# Patient Record
Sex: Female | Born: 1950
Health system: Southern US, Community
[De-identification: ages and names within clinical notes are randomized; demographics above are authoritative.]

## PROBLEM LIST (undated history)

## (undated) DIAGNOSIS — I10 Essential (primary) hypertension: Secondary | ICD-10-CM

## (undated) DIAGNOSIS — Z972 Presence of dental prosthetic device (complete) (partial): Secondary | ICD-10-CM

## (undated) DIAGNOSIS — E039 Hypothyroidism, unspecified: Secondary | ICD-10-CM

## (undated) DIAGNOSIS — E079 Disorder of thyroid, unspecified: Secondary | ICD-10-CM

## (undated) DIAGNOSIS — I6529 Occlusion and stenosis of unspecified carotid artery: Secondary | ICD-10-CM

## (undated) DIAGNOSIS — J387 Other diseases of larynx: Secondary | ICD-10-CM

## (undated) DIAGNOSIS — E785 Hyperlipidemia, unspecified: Secondary | ICD-10-CM

## (undated) DIAGNOSIS — R011 Cardiac murmur, unspecified: Secondary | ICD-10-CM

## (undated) DIAGNOSIS — Z973 Presence of spectacles and contact lenses: Secondary | ICD-10-CM

## (undated) DIAGNOSIS — T7840XA Allergy, unspecified, initial encounter: Secondary | ICD-10-CM

## (undated) HISTORY — PX: CARPAL TUNNEL RELEASE: SHX101

## (undated) HISTORY — DX: Disorder of thyroid, unspecified: E07.9

## (undated) HISTORY — DX: Hyperlipidemia, unspecified: E78.5

## (undated) HISTORY — PX: THYROIDECTOMY: SHX17

## (undated) HISTORY — PX: OTHER SURGICAL HISTORY: SHX169

## (undated) HISTORY — DX: Essential (primary) hypertension: I10

## (undated) HISTORY — DX: Allergy, unspecified, initial encounter: T78.40XA

## (undated) HISTORY — DX: Occlusion and stenosis of unspecified carotid artery: I65.29

## (undated) HISTORY — PX: DILATION AND CURETTAGE OF UTERUS: SHX78

---

## 1973-08-01 HISTORY — PX: TUBAL LIGATION: SHX77

## 1998-01-05 ENCOUNTER — Other Ambulatory Visit: Admission: RE | Admit: 1998-01-05 | Discharge: 1998-01-05 | Payer: Self-pay | Admitting: Family Medicine

## 1998-02-05 ENCOUNTER — Emergency Department (HOSPITAL_COMMUNITY): Admission: EM | Admit: 1998-02-05 | Discharge: 1998-02-05 | Payer: Self-pay | Admitting: Emergency Medicine

## 2000-11-29 ENCOUNTER — Encounter (INDEPENDENT_AMBULATORY_CARE_PROVIDER_SITE_OTHER): Payer: Self-pay | Admitting: Internal Medicine

## 2000-11-29 LAB — CONVERTED CEMR LAB: Pap Smear: NORMAL

## 2000-12-19 ENCOUNTER — Encounter: Payer: Self-pay | Admitting: Family Medicine

## 2000-12-19 ENCOUNTER — Ambulatory Visit (HOSPITAL_COMMUNITY): Admission: RE | Admit: 2000-12-19 | Discharge: 2000-12-19 | Payer: Self-pay | Admitting: Family Medicine

## 2002-01-25 ENCOUNTER — Emergency Department (HOSPITAL_COMMUNITY): Admission: EM | Admit: 2002-01-25 | Discharge: 2002-01-26 | Payer: Self-pay | Admitting: Emergency Medicine

## 2002-04-30 ENCOUNTER — Ambulatory Visit (HOSPITAL_BASED_OUTPATIENT_CLINIC_OR_DEPARTMENT_OTHER): Admission: RE | Admit: 2002-04-30 | Discharge: 2002-04-30 | Payer: Self-pay | Admitting: Orthopedic Surgery

## 2005-03-22 ENCOUNTER — Ambulatory Visit: Payer: Self-pay | Admitting: Family Medicine

## 2005-03-29 ENCOUNTER — Ambulatory Visit: Payer: Self-pay | Admitting: Family Medicine

## 2005-04-18 ENCOUNTER — Ambulatory Visit: Payer: Self-pay | Admitting: Family Medicine

## 2005-06-01 ENCOUNTER — Ambulatory Visit: Payer: Self-pay | Admitting: Family Medicine

## 2005-10-20 ENCOUNTER — Ambulatory Visit: Payer: Self-pay | Admitting: Family Medicine

## 2005-11-03 ENCOUNTER — Ambulatory Visit: Payer: Self-pay | Admitting: Family Medicine

## 2006-01-03 ENCOUNTER — Ambulatory Visit: Payer: Self-pay | Admitting: Family Medicine

## 2006-03-06 ENCOUNTER — Ambulatory Visit: Payer: Self-pay | Admitting: Family Medicine

## 2006-06-05 ENCOUNTER — Ambulatory Visit: Payer: Self-pay | Admitting: Family Medicine

## 2006-06-14 ENCOUNTER — Ambulatory Visit (HOSPITAL_COMMUNITY): Admission: RE | Admit: 2006-06-14 | Discharge: 2006-06-14 | Payer: Self-pay | Admitting: Family Medicine

## 2006-09-05 ENCOUNTER — Ambulatory Visit: Payer: Self-pay | Admitting: Family Medicine

## 2006-10-31 ENCOUNTER — Ambulatory Visit: Payer: Self-pay | Admitting: Family Medicine

## 2006-10-31 LAB — CONVERTED CEMR LAB
AST: 22 units/L (ref 0–37)
BUN: 15 mg/dL (ref 6–23)
CO2: 33 meq/L — ABNORMAL HIGH (ref 19–32)
GFR calc Af Amer: 133 mL/min
HDL: 39.3 mg/dL (ref >39.0)
Potassium: 3.9 meq/L (ref 3.5–5.1)
Sodium: 142 meq/L (ref 135–145)
Total CHOL/HDL Ratio: 4
Triglycerides: 82 mg/dL (ref 0–149)

## 2007-01-04 ENCOUNTER — Ambulatory Visit: Payer: Self-pay | Admitting: Internal Medicine

## 2007-01-04 DIAGNOSIS — E669 Obesity, unspecified: Secondary | ICD-10-CM

## 2007-01-04 DIAGNOSIS — I10 Essential (primary) hypertension: Secondary | ICD-10-CM

## 2007-01-04 DIAGNOSIS — E782 Mixed hyperlipidemia: Secondary | ICD-10-CM

## 2007-01-04 DIAGNOSIS — E039 Hypothyroidism, unspecified: Secondary | ICD-10-CM

## 2007-02-27 ENCOUNTER — Encounter: Payer: Self-pay | Admitting: Internal Medicine

## 2007-02-27 DIAGNOSIS — M949 Disorder of cartilage, unspecified: Secondary | ICD-10-CM

## 2007-02-27 DIAGNOSIS — F17200 Nicotine dependence, unspecified, uncomplicated: Secondary | ICD-10-CM | POA: Insufficient documentation

## 2007-02-27 DIAGNOSIS — M899 Disorder of bone, unspecified: Secondary | ICD-10-CM

## 2007-02-27 DIAGNOSIS — H9190 Unspecified hearing loss, unspecified ear: Secondary | ICD-10-CM | POA: Insufficient documentation

## 2007-02-27 DIAGNOSIS — R945 Abnormal results of liver function studies: Secondary | ICD-10-CM | POA: Insufficient documentation

## 2007-03-07 ENCOUNTER — Ambulatory Visit: Payer: Self-pay | Admitting: Internal Medicine

## 2007-03-07 DIAGNOSIS — E739 Lactose intolerance, unspecified: Secondary | ICD-10-CM

## 2007-03-07 DIAGNOSIS — R0989 Other specified symptoms and signs involving the circulatory and respiratory systems: Secondary | ICD-10-CM

## 2007-03-08 ENCOUNTER — Ambulatory Visit: Payer: Self-pay

## 2007-03-08 ENCOUNTER — Encounter (INDEPENDENT_AMBULATORY_CARE_PROVIDER_SITE_OTHER): Payer: Self-pay | Admitting: Internal Medicine

## 2007-03-08 LAB — CONVERTED CEMR LAB: TSH: 0.56 microintl units/mL (ref 0.35–5.50)

## 2007-03-09 ENCOUNTER — Telehealth (INDEPENDENT_AMBULATORY_CARE_PROVIDER_SITE_OTHER): Payer: Self-pay | Admitting: *Deleted

## 2007-04-12 ENCOUNTER — Ambulatory Visit: Payer: Self-pay | Admitting: Internal Medicine

## 2007-04-20 ENCOUNTER — Encounter (INDEPENDENT_AMBULATORY_CARE_PROVIDER_SITE_OTHER): Payer: Self-pay | Admitting: Internal Medicine

## 2007-04-20 ENCOUNTER — Ambulatory Visit: Payer: Self-pay | Admitting: Vascular Surgery

## 2007-06-12 ENCOUNTER — Ambulatory Visit: Payer: Self-pay | Admitting: Family Medicine

## 2007-09-11 ENCOUNTER — Ambulatory Visit: Payer: Self-pay | Admitting: Family Medicine

## 2007-09-13 LAB — CONVERTED CEMR LAB
BUN: 11 mg/dL (ref 6–23)
CO2: 32 meq/L (ref 19–32)
Cholesterol: 169 mg/dL (ref 0–200)
Creatinine, Ser: 0.8 mg/dL (ref 0.4–1.2)
GFR calc Af Amer: 95 mL/min
Glucose, Bld: 108 mg/dL — ABNORMAL HIGH (ref 70–99)
HDL: 35.4 mg/dL — ABNORMAL LOW (ref >39.0)
Potassium: 3.8 meq/L (ref 3.5–5.1)
Sodium: 141 meq/L (ref 135–145)
Total CHOL/HDL Ratio: 4.8
Triglycerides: 170 mg/dL — ABNORMAL HIGH (ref 0–149)

## 2007-10-19 ENCOUNTER — Ambulatory Visit: Payer: Self-pay | Admitting: Vascular Surgery

## 2007-10-25 ENCOUNTER — Ambulatory Visit: Payer: Self-pay | Admitting: Family Medicine

## 2007-12-07 ENCOUNTER — Telehealth (INDEPENDENT_AMBULATORY_CARE_PROVIDER_SITE_OTHER): Payer: Self-pay | Admitting: Internal Medicine

## 2008-02-29 ENCOUNTER — Ambulatory Visit: Payer: Self-pay | Admitting: Family Medicine

## 2008-04-18 ENCOUNTER — Ambulatory Visit: Payer: Self-pay | Admitting: Vascular Surgery

## 2008-05-09 ENCOUNTER — Ambulatory Visit: Payer: Self-pay | Admitting: Family Medicine

## 2008-05-14 LAB — CONVERTED CEMR LAB
ALT: 32 units/L (ref 0–35)
AST: 22 units/L (ref 0–37)
Direct LDL: 92.6 mg/dL
HDL: 29.4 mg/dL — ABNORMAL LOW (ref >39.0)
Total CHOL/HDL Ratio: 5.9
VLDL: 64 mg/dL — ABNORMAL HIGH (ref 0–40)

## 2008-07-29 ENCOUNTER — Ambulatory Visit: Payer: Self-pay | Admitting: Family Medicine

## 2008-11-07 ENCOUNTER — Ambulatory Visit: Payer: Self-pay | Admitting: Internal Medicine

## 2008-11-11 LAB — CONVERTED CEMR LAB
AST: 23 units/L (ref 0–37)
Albumin: 3.9 g/dL (ref 3.5–5.2)
Alkaline Phosphatase: 83 units/L (ref 39–117)
Total Bilirubin: 0.6 mg/dL (ref 0.3–1.2)
Total CHOL/HDL Ratio: 6
VLDL: 55.8 mg/dL — ABNORMAL HIGH (ref 0.0–40.0)

## 2008-11-14 ENCOUNTER — Encounter (INDEPENDENT_AMBULATORY_CARE_PROVIDER_SITE_OTHER): Payer: Self-pay | Admitting: Internal Medicine

## 2008-11-14 ENCOUNTER — Ambulatory Visit: Payer: Self-pay | Admitting: Family Medicine

## 2008-11-18 ENCOUNTER — Encounter (INDEPENDENT_AMBULATORY_CARE_PROVIDER_SITE_OTHER): Payer: Self-pay | Admitting: Internal Medicine

## 2008-11-19 LAB — CONVERTED CEMR LAB
BUN: 25 mg/dL — ABNORMAL HIGH (ref 6–23)
CO2: 32 meq/L (ref 19–32)
Chloride: 100 meq/L (ref 96–112)
Creatinine, Ser: 0.8 mg/dL (ref 0.4–1.2)
Potassium: 4 meq/L (ref 3.5–5.1)
TSH: 0.07 microintl units/mL — ABNORMAL LOW (ref 0.35–5.50)
Vit D, 25-Hydroxy: 23 ng/mL — ABNORMAL LOW (ref 30–89)

## 2008-11-27 ENCOUNTER — Encounter: Payer: Self-pay | Admitting: Family Medicine

## 2008-11-28 ENCOUNTER — Encounter (INDEPENDENT_AMBULATORY_CARE_PROVIDER_SITE_OTHER): Payer: Self-pay | Admitting: Internal Medicine

## 2008-11-28 ENCOUNTER — Ambulatory Visit (HOSPITAL_COMMUNITY): Admission: RE | Admit: 2008-11-28 | Discharge: 2008-11-28 | Payer: Self-pay | Admitting: Family Medicine

## 2008-12-05 ENCOUNTER — Encounter (INDEPENDENT_AMBULATORY_CARE_PROVIDER_SITE_OTHER): Payer: Self-pay | Admitting: *Deleted

## 2008-12-05 ENCOUNTER — Ambulatory Visit: Payer: Self-pay | Admitting: Vascular Surgery

## 2008-12-11 ENCOUNTER — Inpatient Hospital Stay (HOSPITAL_COMMUNITY): Admission: RE | Admit: 2008-12-11 | Discharge: 2008-12-12 | Payer: Self-pay | Admitting: Vascular Surgery

## 2008-12-11 ENCOUNTER — Ambulatory Visit: Payer: Self-pay | Admitting: Vascular Surgery

## 2008-12-11 ENCOUNTER — Encounter: Payer: Self-pay | Admitting: Vascular Surgery

## 2008-12-11 HISTORY — PX: CAROTID ENDARTERECTOMY: SUR193

## 2008-12-16 ENCOUNTER — Encounter (INDEPENDENT_AMBULATORY_CARE_PROVIDER_SITE_OTHER): Payer: Self-pay | Admitting: Internal Medicine

## 2008-12-26 ENCOUNTER — Ambulatory Visit: Payer: Self-pay | Admitting: Vascular Surgery

## 2008-12-26 ENCOUNTER — Encounter (INDEPENDENT_AMBULATORY_CARE_PROVIDER_SITE_OTHER): Payer: Self-pay | Admitting: Internal Medicine

## 2008-12-30 ENCOUNTER — Encounter: Admission: RE | Admit: 2008-12-30 | Discharge: 2008-12-30 | Payer: Self-pay | Admitting: Family Medicine

## 2009-01-02 ENCOUNTER — Ambulatory Visit: Payer: Self-pay | Admitting: Family Medicine

## 2009-01-06 ENCOUNTER — Encounter (INDEPENDENT_AMBULATORY_CARE_PROVIDER_SITE_OTHER): Payer: Self-pay | Admitting: Internal Medicine

## 2009-02-13 ENCOUNTER — Ambulatory Visit: Payer: Self-pay | Admitting: Family Medicine

## 2009-02-13 DIAGNOSIS — R609 Edema, unspecified: Secondary | ICD-10-CM

## 2009-02-17 ENCOUNTER — Encounter (INDEPENDENT_AMBULATORY_CARE_PROVIDER_SITE_OTHER): Payer: Self-pay | Admitting: Internal Medicine

## 2009-02-17 LAB — CONVERTED CEMR LAB
BUN: 22 mg/dL (ref 6–23)
CO2: 31 meq/L (ref 19–32)
Calcium: 9.6 mg/dL (ref 8.4–10.5)
Chloride: 105 meq/L (ref 96–112)
Cholesterol: 185 mg/dL (ref 0–200)
Creatinine, Ser: 0.9 mg/dL (ref 0.4–1.2)
HDL: 32.4 mg/dL — ABNORMAL LOW (ref >39.00)
Total CHOL/HDL Ratio: 6
Triglycerides: 142 mg/dL (ref 0.0–149.0)

## 2009-04-03 ENCOUNTER — Ambulatory Visit: Payer: Self-pay | Admitting: Family Medicine

## 2009-04-07 LAB — CONVERTED CEMR LAB
Glucose, Bld: 120 mg/dL — ABNORMAL HIGH (ref 70–99)
HDL: 28.9 mg/dL — ABNORMAL LOW (ref >39.00)
LDL Cholesterol: 85 mg/dL (ref 0–99)
Total CHOL/HDL Ratio: 5
Triglycerides: 120 mg/dL (ref 0.0–149.0)
VLDL: 24 mg/dL (ref 0.0–40.0)

## 2009-05-07 ENCOUNTER — Ambulatory Visit: Payer: Self-pay | Admitting: Family Medicine

## 2009-07-01 ENCOUNTER — Ambulatory Visit: Payer: Self-pay | Admitting: Vascular Surgery

## 2009-07-28 ENCOUNTER — Ambulatory Visit: Payer: Self-pay | Admitting: Family Medicine

## 2009-10-12 ENCOUNTER — Ambulatory Visit: Payer: Self-pay | Admitting: Family Medicine

## 2009-10-12 LAB — CONVERTED CEMR LAB
ALT: 26 units/L (ref 0–35)
AST: 21 units/L (ref 0–37)
Alkaline Phosphatase: 72 units/L (ref 39–117)
BUN: 13 mg/dL (ref 6–23)
Bilirubin, Direct: 0.1 mg/dL (ref 0.0–0.3)
Calcium: 10.3 mg/dL (ref 8.4–10.5)
Creatinine, Ser: 0.8 mg/dL (ref 0.4–1.2)
GFR calc non Af Amer: 77.65 mL/min (ref >60)
Glucose, Bld: 116 mg/dL — ABNORMAL HIGH (ref 70–99)
Potassium: 4.4 meq/L (ref 3.5–5.1)
Total Bilirubin: 0.5 mg/dL (ref 0.3–1.2)
Total Protein: 7.2 g/dL (ref 6.0–8.3)

## 2010-01-11 ENCOUNTER — Encounter: Admission: RE | Admit: 2010-01-11 | Discharge: 2010-01-11 | Payer: Self-pay | Admitting: Family Medicine

## 2010-01-11 ENCOUNTER — Ambulatory Visit: Payer: Self-pay | Admitting: Family Medicine

## 2010-01-13 ENCOUNTER — Encounter: Payer: Self-pay | Admitting: Family Medicine

## 2010-01-13 DIAGNOSIS — R928 Other abnormal and inconclusive findings on diagnostic imaging of breast: Secondary | ICD-10-CM | POA: Insufficient documentation

## 2010-01-13 LAB — CONVERTED CEMR LAB
Albumin: 4 g/dL (ref 3.5–5.2)
Alkaline Phosphatase: 70 units/L (ref 39–117)
Cholesterol: 164 mg/dL (ref 0–200)
Total Bilirubin: 0.4 mg/dL (ref 0.3–1.2)
Total CHOL/HDL Ratio: 5
VLDL: 68.6 mg/dL — ABNORMAL HIGH (ref 0.0–40.0)

## 2010-01-15 ENCOUNTER — Ambulatory Visit: Payer: Self-pay | Admitting: Vascular Surgery

## 2010-01-18 ENCOUNTER — Encounter: Admission: RE | Admit: 2010-01-18 | Discharge: 2010-01-18 | Payer: Self-pay | Admitting: Family Medicine

## 2010-02-08 ENCOUNTER — Ambulatory Visit: Payer: Self-pay | Admitting: Family Medicine

## 2010-02-09 LAB — CONVERTED CEMR LAB
ALT: 28 units/L (ref 0–35)
Direct LDL: 98.7 mg/dL
HDL: 36.4 mg/dL — ABNORMAL LOW (ref >39.00)
Total Bilirubin: 0.4 mg/dL (ref 0.3–1.2)
Total Protein: 6.9 g/dL (ref 6.0–8.3)
Triglycerides: 247 mg/dL — ABNORMAL HIGH (ref 0.0–149.0)
VLDL: 49.4 mg/dL — ABNORMAL HIGH (ref 0.0–40.0)

## 2010-05-17 ENCOUNTER — Ambulatory Visit: Payer: Self-pay | Admitting: Family Medicine

## 2010-05-18 ENCOUNTER — Encounter: Payer: Self-pay | Admitting: Family Medicine

## 2010-05-18 LAB — CONVERTED CEMR LAB: Cholesterol: 156 mg/dL (ref 0–200)

## 2010-07-21 ENCOUNTER — Encounter
Admission: RE | Admit: 2010-07-21 | Discharge: 2010-07-21 | Payer: Self-pay | Source: Home / Self Care | Attending: Family Medicine | Admitting: Family Medicine

## 2010-07-21 LAB — HM PAP SMEAR: HM Pap smear: NORMAL

## 2010-07-22 ENCOUNTER — Encounter: Payer: Self-pay | Admitting: Family Medicine

## 2010-08-31 NOTE — Assessment & Plan Note (Signed)
Summary: XFERED FROM BILLIE BEAN-30 TO ALLOW TIME  CYD   Vital Signs:  Patient profile:   60 year old female Height:      66 inches Weight:      259.13 pounds BMI:     41.98 Temp:     98.5 degrees F oral Pulse rate:   64 / minute Pulse rhythm:   regular BP sitting:   120 / 70  (right arm) Cuff size:   large  Vitals Entered By: Linde Gillis CMA Duncan Dull) (January 11, 2010 8:34 AM) CC: 30 minute exam, Billie patient   History of Present Illness: 60 yo pt new to me here for follow up labs.  HLD- on Zocor 80 mg daily. s/p left CEA and stent placement 07/2009 (Dr. [name]). TG elevated to 203 in March, otherwise FLP was normal. Admits to still smoking although she has cut back to 1/2 ppd.    HTN- tolerating meds without side effects. no HA or blurred vision.  LE edema- works late shift, on her feet a lot, has a lot of late night and morning LE edema. No CP, shortness of breath.  No warmth or tenderness over her legs  Hypothyroidism- TSH within normal limits in March, taking Levothyroid 112 micrograms daily.    Current Medications (verified): 1)  Atenolol 50 Mg Tabs (Atenolol) .... Take 1 Tablet By Mouth Once A Day 2)  Loratadine 10 Mg Tabs (Loratadine) .... As Needed 3)  Calcium 600/vitamin D 600-200 Mg-Unit Tabs (Calcium Carbonate-Vitamin D) .... Take 1 Tablet By Mouth Two Times A Day 4)  Ascorbic Acid  Gran (Ascorbic Acid) .... Take 1 Tablet By Mouth Once A Day 5)  Levothroid 112 Mcg Tabs (Levothyroxine Sodium) .Marland Kitchen.. 1 Once Daily For Thyroid 6)  Aspirin 81 Mg  Tabs (Aspirin) .... One Daily 7)  Zocor 80 Mg Tabs (Simvastatin) .... Take 1 Once Daily For Elevated Cholesterol 8)  Hydrochlorothiazide 12.5 Mg Caps (Hydrochlorothiazide) .Marland Kitchen.. 1 Tab By Mouth Two Times A Day  Allergies: 1)  ! Darvocet 2)  ! Darvon 3)  Darvocet-N 100 4)  Darvon  Review of Systems      See HPI General:  Denies malaise. CV:  Denies chest pain or discomfort. Resp:  Denies shortness of breath. MS:   Denies joint pain, joint redness, joint swelling, muscle aches, and muscle weakness. Psych:  Denies anxiety and depression.  Physical Exam  General:  alert, well-developed, well-nourished, well-hydrated, and overweight-appearing.   Neck:  no thyromegaly and no JVD. Lungs:  normal respiratory effort, no intercostal retractions, no accessory muscle use, and normal breath sounds.   Heart:  normal rate, regular rhythm, and no murmur.   Extremities:  1+ left pedal edema and 1+ right pedal edema.   No warmth or erythema, NTTP Neurologic:  alert & oriented X3 and gait normal.   Psych:  normally interactive, not anxious appearing, and not depressed appearing.     Impression & Recommendations:  Problem # 1:  EDEMA LEG (ICD-782.3) Assessment Deteriorated will try to take HCTZ 12.5 mg two times a day instead of 25 mg daily, since she is on her feet late at night.  Pt in agreement with plan.  If edema worsens, she will let me know. The following medications were removed from the medication list:    Hydrochlorothiazide 25 Mg Tabs (Hydrochlorothiazide) .Marland Kitchen... 1 once daily for bp--fluid pill Her updated medication list for this problem includes:    Hydrochlorothiazide 12.5 Mg Caps (Hydrochlorothiazide) .Marland Kitchen... 1 tab by mouth  two times a day  Problem # 2:  HYPERLIPIDEMIA, MIXED, MILD (ICD-272.2) Assessment: Deteriorated Recheck FLP today, hopefully TG have improved. Her updated medication list for this problem includes:    Zocor 80 Mg Tabs (Simvastatin) .Marland Kitchen... Take 1 once daily for elevated cholesterol  Orders: Venipuncture ([address]) TLB-Lipid Panel (80061-LIPID) TLB-Hepatic/Liver Function Pnl (80076-HEPATIC)  Problem # 3:  HYPERTENSION, BENIGN ESSENTIAL (ICD-401.1) Assessment: Unchanged Continue current meds. The following medications were removed from the medication list:    Hydrochlorothiazide 25 Mg Tabs (Hydrochlorothiazide) .Marland Kitchen... 1 once daily for bp--fluid pill Her updated medication list for  this problem includes:    Atenolol 50 Mg Tabs (Atenolol) .Marland Kitchen... Take 1 tablet by mouth once a day    Hydrochlorothiazide 12.5 Mg Caps (Hydrochlorothiazide) .Marland Kitchen... 1 tab by mouth two times a day  Complete Medication List: 1)  Atenolol 50 Mg Tabs (Atenolol) .... Take 1 tablet by mouth once a day 2)  Loratadine 10 Mg Tabs (Loratadine) .... As needed 3)  Calcium 600/vitamin D 600-200 Mg-unit Tabs (Calcium carbonate-vitamin d) .... Take 1 tablet by mouth two times a day 4)  Ascorbic Acid Gran (Ascorbic acid) .... Take 1 tablet by mouth once a day 5)  Levothroid 112 Mcg Tabs (Levothyroxine sodium) .Marland Kitchen.. 1 once daily for thyroid 6)  Aspirin 81 Mg Tabs (Aspirin) .... One daily 7)  Zocor 80 Mg Tabs (Simvastatin) .... Take 1 once daily for elevated cholesterol 8)  Hydrochlorothiazide 12.5 Mg Caps (Hydrochlorothiazide) .Marland Kitchen.. 1 tab by mouth two times a day Prescriptions: LEVOTHROID 112 MCG TABS (LEVOTHYROXINE SODIUM) 1 once daily for thyroid  #90 x 3   Entered and Authorized by:   Ruthe Mannan MD   Signed by:   Ruthe Mannan MD on 01/11/2010   Method used:   Electronically to        [facility]. [phone]* (retail)       [address]       [place], [place]  [address]       Ph: [phone]       Fax: [phone]   RxID:   [id number] ATENOLOL 50 MG TABS (ATENOLOL) Take 1 tablet by mouth once a day  #90 x 3   Entered and Authorized by:   Ruthe Mannan MD   Signed by:   Ruthe Mannan MD on 01/11/2010   Method used:   Electronically to        [facility]. [phone]* (retail)       [address]       [place], [place]  [address]       Ph: [phone]       Fax: [phone]   RxID:   [id number] HYDROCHLOROTHIAZIDE 12.5 MG CAPS (HYDROCHLOROTHIAZIDE) 1 tab by mouth two times a day  #60 x 6   Entered and Authorized by:   Ruthe Mannan MD   Signed by:   Ruthe Mannan MD on 01/11/2010   Method used:   Electronically to        [facility] [phone]* (retail)       [address]       [place], [place]   [address]       Ph: [phone]       Fax: [phone]   RxID:   [id number]   Current Allergies (reviewed today): ! DARVOCET ! DARVON DARVOCET-N 100 DARVON

## 2010-08-31 NOTE — Assessment & Plan Note (Signed)
Summary: aron flu shot/rbh   Nurse Visit   Allergies: 1)  ! Darvocet 2)  ! Darvon 3)  Darvocet-N 100 4)  Darvon  Orders Added: 1)  Admin 1st Vaccine [90471] 2)  Flu Vaccine 7yrs + [[address]]            Flu Vaccine Consent Questions     Do you have a history of severe allergic reactions to this vaccine? no    Any prior history of allergic reactions to egg and/or gelatin? no    Do you have a sensitivity to the preservative Thimersol? no    Do you have a past history of Guillan-Barre Syndrome? no    Do you currently have an acute febrile illness? no    Have you ever had a severe reaction to latex? no    Vaccine information given and explained to patient? yes    Are you currently pregnant? no    Lot Number:[id number]   Exp Date:01/29/2011   Site Given  Left Deltoid IMlu

## 2010-08-31 NOTE — Miscellaneous (Signed)
Summary: Orders Update   Clinical Lists Changes  Problems: Added new problem of MAMMOGRAM, ABNORMAL, LEFT (ICD-793.80) Orders: Added new Referral order of Radiology Referral (Radiology) - Signed 

## 2010-08-31 NOTE — Letter (Signed)
Summary: Generic Letter  [place] at [facility]  [address] [place], [place] [address]   Phone: [phone]  Fax: [phone]    05/18/2010  [facility] [place] [address] [place], [place]  [address]  Dear Ms. Sookdeo,     We have received your lab results and Dr. Dayton Martes says that your labs look good.  Your triglycerides just a little elevated and your good cholesterol, (HDL) a little low.  Decrease added sugars, eliminated trans fats, increase fiber and limit alcohol.  All these changes together can drop triglycerides by almost 50%.  Recheck in one year.      Sincerely,        Linde Gillis CMA (AAMA)for Dr. Ruthe Mannan

## 2010-09-02 NOTE — Letter (Signed)
Summary: Results Follow up Letter  [place] at [facility]  [address] [place], [place] [address]   Phone: [phone]  Fax: [phone]    07/22/2010 MRN: [id number]  [facility] [place] [address] [place], [place]  [address]  Dear Ms. Diffee,  The following are the results of your recent test(s):  Test         Result    Pap Smear:        Normal _____  Not Normal _____ Comments: ______________________________________________________ Cholesterol: LDL(Bad cholesterol):         Your goal is less than:         HDL (Good cholesterol):       Your goal is more than: Comments:  ______________________________________________________ Mammogram:        Normal __X___  Not Normal _____ Comments: Please follow up in 6 months with a bilateral mammogram.  ___________________________________________________________________ Hemoccult:        Normal _____  Not normal _______ Comments:    _____________________________________________________________________ Other Tests:    We routinely do not discuss normal results over the telephone.  If you desire a copy of the results, or you have any questions about this information we can discuss them at your next office visit.   Sincerely,      Dr. Ruthe Mannan

## 2010-11-09 LAB — PROTIME-INR: Prothrombin Time: 12.7 seconds (ref 11.6–15.2)

## 2010-11-09 LAB — CBC
Hemoglobin: 16 g/dL — ABNORMAL HIGH (ref 12.0–15.0)
MCHC: 34.8 g/dL (ref 30.0–36.0)
MCHC: 35.4 g/dL (ref 30.0–36.0)
MCV: 90.4 fL (ref 78.0–100.0)
MCV: 90.7 fL (ref 78.0–100.0)
Platelets: 197 10*3/uL (ref 150–400)
RBC: 5.06 MIL/uL (ref 3.87–5.11)
WBC: 4.7 10*3/uL (ref 4.0–10.5)

## 2010-11-09 LAB — BASIC METABOLIC PANEL
BUN: 9 mg/dL (ref 6–23)
CO2: 28 mEq/L (ref 19–32)
Calcium: 8.5 mg/dL (ref 8.4–10.5)
Chloride: 104 mEq/L (ref 96–112)
Creatinine, Ser: 0.72 mg/dL (ref 0.4–1.2)
GFR calc Af Amer: >60 mL/min (ref >60)

## 2010-11-09 LAB — COMPREHENSIVE METABOLIC PANEL
BUN: 13 mg/dL (ref 6–23)
CO2: 27 mEq/L (ref 19–32)
Calcium: 10.2 mg/dL (ref 8.4–10.5)
Creatinine, Ser: 0.67 mg/dL (ref 0.4–1.2)
GFR calc non Af Amer: >60 mL/min (ref >60)
Glucose, Bld: 96 mg/dL (ref 70–99)

## 2010-11-09 LAB — URINALYSIS, ROUTINE W REFLEX MICROSCOPIC
Glucose, UA: NEGATIVE mg/dL
Ketones, ur: NEGATIVE mg/dL
Nitrite: NEGATIVE
Protein, ur: NEGATIVE mg/dL

## 2010-11-09 LAB — TYPE AND SCREEN
DAT, IgG: NEGATIVE
Donor AG Type: NEGATIVE

## 2010-11-09 LAB — APTT: aPTT: 33 seconds (ref 24–37)

## 2010-12-14 NOTE — Procedures (Signed)
CAROTID DUPLEX EXAM   INDICATION:  Followup evaluation of known carotid artery disease.   HISTORY:  Diabetes:  No.  Cardiac:  No.  Hypertension:  Yes.  Smoking:  Yes.  Previous Surgery:  No.  CV History:  Previous duplex revealed 40-59% right ICA stenosis and 60-  79% left ICA stenosis on 04/18/2008.  Amaurosis Fugax No, Paresthesias No, Hemiparesis No                                       RIGHT             LEFT  Brachial systolic pressure:         112               158  Brachial Doppler waveforms:         Monophasic        Triphasic  Vertebral direction of flow:        To and fro        Antegrade  DUPLEX VELOCITIES (cm/sec)  CCA peak systolic                   89                77  ECA peak systolic                   151               126  ICA peak systolic                   173               300  ICA end diastolic                   65                118  PLAQUE MORPHOLOGY:                  Calcified         Mixed irregular  PLAQUE AMOUNT:                      Moderate          Moderate to severe  PLAQUE LOCATION:                    Proximal ICA      Proximal ICA   IMPRESSION:  1. 40-59% right ICA stenosis (high end of range).  2. 80-99% left ICA stenosis (low end of range).  3. Right subclavian steal syndrome.   ___________________________________________  Larina Earthly, M.D.   MC/MEDQ  D:  12/05/2008  T:  12/05/2008  Job:  [id number]

## 2010-12-14 NOTE — Op Note (Signed)
Gloria Murray, Gloria Murray                   ACCOUNT NO.:  [id number]   MEDICAL RECORD NO.:  [id number]          PATIENT TYPE:  INP   LOCATION:  3315                         FACILITY:  [facility]   PHYSICIAN:  Larina Earthly, M.D.    DATE OF BIRTH:  1951-04-04   DATE OF PROCEDURE:  12/11/2008  DATE OF DISCHARGE:                               OPERATIVE REPORT   PREOPERATIVE DIAGNOSIS:  Severe asymptomatic left internal carotid  artery stenosis.   POSTOPERATIVE DIAGNOSIS:  Severe asymptomatic left internal carotid  artery stenosis.   PROCEDURE:  Left carotid endarterectomy and Dacron patch angioplasty.   SURGEON:  Larina Earthly, MD   ASSISTANT:  Jerold Coombe, PA-C   ANESTHESIA:  General endotracheal.   COMPLICATIONS:  None.   DISPOSITION:  To recovery room and neurologically intact.   PROCEDURE IN DETAIL:  The patient was taken to the operating room and  placed in supine position.  The area of the left neck was prepped and  draped in the usual sterile fashion.  Incision was made anterior  sternocleidomastoid and carried down through the platysma with  electrocautery.  The sternocleidomastoid was reflected posteriorly and  the carotid sheath was opened.  The vagus and hypoglossal nerves were  identified and preserved.  The common carotid artery was circled with an  umbilical tape and Rumel tourniquet.  Dissection was taken on to the  bifurcation.  The superior thyroid artery was circled with a 2-0 silk  Potts tie.  The external carotid was encircled with the blue vessel  loop.  The internal carotid was encircled with umbilical tape and Rumel  tourniquet.  The patient was given 9000 units of intravenous heparin.  After adequate circulation time, the internal, external and common  carotid arteries were occluded.  The plaque did extend down onto the  common carotid artery.  A 10 shunt was passed at the internal carotid  allowed to backbleed and then down the common carotid artery was  secured  with Rumel tourniquets.  The endarterectomy was began on the common  carotid artery and the plaques were divided proximally with Potts  scissors.  Dissection was taken on to the bifurcation.  The external  carotid was endarterectomized with an eversion technique and the  internal carotid was endarterectomized in an open fashion.  Remaining  atheromatous debris was removed from the endarterectomy plane.  A  Finesse Hemashield Dacron patch was brought onto the field and sewn as a  patch angioplasty with a running 6-0 Prolene suture.  Prior to  completion of the anastomosis, the shunt was removed and the usual  flushing maneuvers were undertaken.  The anastomosis was completed and  flow was restored first to the external and then the internal carotid  artery.  Excellent flow characteristics were noted with handheld Doppler  in the internal and external carotid arteries.  The patient was given 50  mg of protamine to reverse the heparin.  Wounds were irrigated with  saline.  Hemostasis with electrocautery.  Wounds were closed with  several  3-0 Vicryl sutures to reapproximate sternocleidomastoid  over carotid  sheath.  Next, the platysma was closed with running 3-0 Vicryl sutures  and finally, the skin was closed with a 4-0 subcuticular Vicryl stitch.  Sterile dressing was applied and the patient was taken to the recovery  room in stable condition and neurologically intact.      Larina Earthly, M.D.  Electronically Signed     TFE/MEDQ  D:  12/11/2008  T:  12/11/2008  Job:  [id number]

## 2010-12-14 NOTE — Consult Note (Signed)
NEW PATIENT CONSULTATION   Haden, Alga E  DOB:  08/17/1950                                       04/20/2007  JXBJY#:[id number]   The patient presents today for evaluation of asymptomatic  cerebrovascular occlusive disease.  She is a pleasant, 60 year old white  female who is found on physical exam to have carotid bruits.  She  underwent further evaluation with outpatient duplex evaluation which I  have reviewed, this was done at [facility] on 03/08/07.  The  patient specifically denies any prior amaurosis fugax, transient  ischemic attack or stroke.  She also denies any upper arm fatigue with  exercise.   PAST MEDICAL HISTORY:  Significant for hypertension, elevated  cholesterol, she does not have any cardiac history.   FAMILY HISTORY:  Her father died at a young age with heart attack.   SOCIAL HISTORY:  She is widowed.  She does smoke 1 pack of cigarettes  per day.  Has occasional social alcohol consumption.   REVIEW OF SYSTEMS:  Positive for weight gain, she does weigh 230 pounds.  Her review of systems otherwise totally negative.  She is status post  thyroidectomy.   MEDICATIONS:  Lipitor, atenolol, Synthroid, hydrochlorothiazide.   PHYSICAL EXAMINATION:  General:  Well developed and well nourished white  female appearing stated age of 15.  Vital signs:  Blood pressure is  146/97 in her left arm, 134/95 in her right arm, pulse is 85,  respirations 18.  She is grossly intact neurologically.  Her carotids  have normal pulsations bilaterally.  She has carotid bruits bilaterally  and also a bruit in the right subclavian region.  She does have 2+  radial pulses bilaterally.  Abdomen:  Reveals moderate obesity with no  masses.  Chest:  Clear.  Heart:  Regular rate and rhythm.  She does have  2+ dorsalis pedis pulses bilaterally.   I had a long discussion with the patient and her friend present  explaining her carotid duplex evaluation and exam.  I  explained that she  does have by duplex criteria 60-79% bilateral internal carotid artery  stenoses.  I explained symptoms of carotid disease with her.  I did  explain the indication for carotid treatment which would be progression  to a severe level of stenosis which was asymptomatic or symptoms related  to her moderate to severe carotid disease.  I explained amaurosis  symptoms and amaurosis fugax and transient ischemic attack.  She will  notify us should this occur.  Otherwise, we will see her in 6 months  with repeat carotid duplex for follow up.   Larina Earthly, M.D.  Electronically Signed   TFE/MEDQ  D:  04/20/2007  T:  04/23/2007  Job:  [id number]   cc:   Arta Silence, MD  Atha Starks. Bean, FNP

## 2010-12-14 NOTE — Discharge Summary (Signed)
NAMEMARLON, VONRUDEN                   ACCOUNT NO.:  [id number]   MEDICAL RECORD NO.:  [id number]          PATIENT TYPE:  INP   LOCATION:  3315                         FACILITY:  [facility]   PHYSICIAN:  Larina Earthly, M.D.    DATE OF BIRTH:  08/08/1950   DATE OF ADMISSION:  12/11/2008  DATE OF DISCHARGE:  12/12/2008                               DISCHARGE SUMMARY   ADMISSION DIAGNOSIS:  Severe asymptomatic left internal carotid artery  stenosis.   FINAL DISCHARGE DIAGNOSES:  1. Severe asymptomatic left internal carotid artery stenosis, status      post left carotid endarterectomy.  2. Ongoing tobacco abuse.  3. Hypothyroidism.  4. Hyperlipidemia.  5. Hypertension with postoperative hypotension, improved.  6. Postoperative hypokalemia, supplemented.   PROCEDURES:  Dec 11, 2008, left carotid endarterectomy with Dacron patch  angioplasty by Dr. Gretta Began.   BRIEF HISTORY:  Ms. Scatena is a 60 year old Caucasian female who has been  followed by Dr. [name] for asymptomatic carotid disease.  Recent repeat  carotid duplex showed progression from moderate-to-severe multiple  stenoses in her left internal carotid artery.  It revealed she had  normal internal carotid distal to the plaque.  Dr. [name] recommended  left elective left carotid endarterectomy to reduce her risk for future  stroke.  She has been asymptomatic.   HOSPITAL COURSE:  Ms. Liberati was electively admitted to [facility] on Dec 11, 2008.  She underwent the previously mentioned  procedure.  Postoperatively, she was extubated and neurologically  intact.  After short stay in recovery unit, was transferred to step-down  unit 3300 where she remained until discharge and in initial  postoperative period, she did require short-term dopamine drip for  hypotension, this was weaned by midnight the day of surgery.  She also  had some mild postoperative nausea and vomiting, but by lunchtime of  postoperative day #1, was tolerating  regular food.  Hemodynamically, she  remained stable, blood pressure 112/66, afebrile, oxygen saturations  above 90% on room air, heart rate in the 60s to 70s in sinus rhythm.  She had one brief dip into the 30s when she had emesis episode and it  was felt to be more of a of a vasovagal response.   Her postoperative labs showed a white count of 4.7, hemoglobin 12.6,  hematocrit 35.7, platelet count 197.  Sodium 138, potassium 3.4 which  was supplemented, BUN of 9, creatinine 0.72, and blood glucose of 118.  Her incision was clean, dry, intact without evidence of infection or  hematoma.  Neurologically, she remained intact, tongue was midline.  She  moved all extremities strongly and symmetrically.  She is voiding and  ambulating without difficulty and pain was controlled on oral  medication.  Once her nausea improved, felt she was appropriate for  discharge home on the afternoon of postoperative day #1, Dec 12, 2008.  She was felt to be improving in stable condition.   DISCHARGE MEDICATIONS:  1. Loratadine 10 mg one tablet daily.  2. Hydrochlorothiazide 25 mg daily.  3. Atenolol 50 mg p.o. daily.  4. Levothyroxine 125 mcg daily.  5. Simvastatin 40 mg daily.  6. Aspirin 81 mg daily.  7. Starlix tablet one to two tablets daily.  8. Fish oil capsule 1000 mg daily.  9. Vitamin C supplement daily.  10.Vitamin D 500 units daily.  11.Calcium with vitamin D 1200 mg one half tablet daily.  12.Percocet 5/325 mg one to two tablets p.o. q.4 h. p.r.n. pain.   DISCHARGE INSTRUCTIONS:  She continue with a heart-healthy diet.  May  shower and clean incisions gently with soap and water.  Avoid driving or  heavy lifting for the next couple of weeks.  Otherwise, she will see Dr.  [name] in 2-3 weeks.  She should call sooner if she has fever greater  than 101, redness or drainage from her incision site, severe headache or  neurologic changes.      Jerold Coombe, P.A.      Larina Earthly,  M.D.  Electronically Signed    AWZ/MEDQ  D:  12/12/2008  T:  12/13/2008  Job:  [id number]   cc:   [facility] Office  Billie D. Bean, FNP

## 2010-12-14 NOTE — Procedures (Signed)
CAROTID DUPLEX EXAM   INDICATION:  Followup left carotid endarterectomy.   HISTORY:  Diabetes:  No.  Cardiac:  No.  Hypertension:  Yes.  Smoking:  Yes.  Previous Surgery:  Left carotid endarterectomy on 12/11/2008.  CV History:  Currently asymptomatic.  Amaurosis Fugax No, Paresthesias No, Hemiparesis No                                       RIGHT             LEFT  Brachial systolic pressure:         112               142  Brachial Doppler waveforms:         Monophasic        Normal  Vertebral direction of flow:        Bidirectional     Antegrade  DUPLEX VELOCITIES (cm/sec)  CCA peak systolic                   75                74  ECA peak systolic                   97                100  ICA peak systolic                   125               81  ICA end diastolic                   50                28  PLAQUE MORPHOLOGY:                  Mixed             Mixed  PLAQUE AMOUNT:                      Mild / moderate   Mild  PLAQUE LOCATION:                    ICA / ECA         CCA   IMPRESSION:  1. 40%-59% stenosis of the right internal carotid artery.  2. Patent left carotid endarterectomy site with no evidence of left      internal carotid artery stenosis.  3. Partial right subclavian steal syndrome with a velocity of 274 cm/s      noted in the right proximal subclavian artery.  4. Significant improvement of the left internal carotid artery when      compared to the previous exam on 12/05/2008 with the right internal      carotid artery remaining stable.   ___________________________________________  Larina Earthly, M.D.   CH/MEDQ  D:  07/01/2009  T:  07/02/2009  Job:  [id number]

## 2010-12-14 NOTE — Procedures (Signed)
CAROTID DUPLEX EXAM   INDICATION:  Follow up left carotid endarterectomy.   HISTORY:  Diabetes:  No.  Cardiac:  No.  Hypertension:  Yes.  Smoking:  Yes.  Previous Surgery:  Left carotid endarterectomy on 12/11/2008.  CV History:  Currently asymptomatic.  Amaurosis Fugax No, Paresthesias No, Hemiparesis No.                                       RIGHT             LEFT  Brachial systolic pressure:         110               140  Brachial Doppler waveforms:         Abnormal          Normal  Vertebral direction of flow:        Bidirectional     Antegrade  DUPLEX VELOCITIES (cm/sec)  CCA peak systolic                   57                76  ECA peak systolic                   100               106  ICA peak systolic                   157               88  ICA end diastolic                   51                27  PLAQUE MORPHOLOGY:                  Heterogenous      Heterogenous  PLAQUE AMOUNT:                      Moderate          Mild  PLAQUE LOCATION:                    ICA/ECA           Mid CCA   IMPRESSION:  1. Doppler velocities suggest a 40% to 59% stenosis of the right      internal carotid artery.  2. Patent left carotid endarterectomy site with no left internal      carotid artery stenosis.  3. Partial right subclavian steal syndrome noted with an increased      velocity of 364 cm/s noted in the right subclavian artery.  4. No significant change noted when compared to the previous      examination on 07/01/2009.   ___________________________________________  Larina Earthly, M.D.   CH/MEDQ  D:  01/15/2010  T:  01/15/2010  Job:  [id number]

## 2010-12-14 NOTE — Procedures (Signed)
CAROTID DUPLEX EXAM   INDICATION:  Followup, carotid artery disease.   HISTORY:  Diabetes:  No.  Cardiac:  No.  Hypertension:  Yes.  Smoking:  Yes.  Previous Surgery:  No.  CV History:  Amaurosis Fugax No, Paresthesias No, Hemiparesis No.                                       RIGHT               LEFT  Brachial systolic pressure:         128                 154  Brachial Doppler waveforms:         Monophasic          Normal  Vertebral direction of flow:        Bidirectional       Antegrade  DUPLEX VELOCITIES (cm/sec)  CCA peak systolic                   97                  95  ECA peak systolic                   162                 96  ICA peak systolic                   148                 230  ICA end diastolic                   50                  72  PLAQUE MORPHOLOGY:                  Heterogenous/calcific                 Heterogenous  PLAQUE AMOUNT:                      Moderate            Moderate  PLAQUE LOCATION:                    ICA/ECA             ICA/ECA/CCA   IMPRESSION:  1. 40-59% stenosis of the right internal carotid artery.  2. 60-79% stenosis of the left internal carotid artery.  3. Mild progression of disease in the right internal carotid artery      noted with no significant change in Doppler velocities of the left      internal carotid artery when compared to the previous examination      on 10/19/07.   ___________________________________________  Larina Earthly, M.D.   CH/MEDQ  D:  04/18/2008  T:  04/18/2008  Job:  [id number]

## 2010-12-14 NOTE — Assessment & Plan Note (Signed)
OFFICE VISIT   Gloria Murray, Gloria Murray  DOB:  1950-12-15                                       04/18/2008  [id number]#:[id number]   Patient presents today for evaluation of extra-cranial cerebrovascular  occlusive disease.  She has known asymptomatic disease, and we are  following her in the noninvasive vascular lab.  She specifically denies  any amaurosis fugax, transient ischemic attack, or stroke.  She also has  concern regarding bilateral lower extremity swelling.  This is worse on  the right than on the left.  She does not have any history of deep  venous thrombosis.   Her medical history is otherwise unchanged.  She does continue to smoke.  She has hypertension and elevated lipids.  No cardiac disease.   PHYSICAL EXAMINATION:  A well-developed white female appearing stated  age of 13.  Blood pressure is 154/105 in the left arm, 127/92 on her  right arm.  Her pulse is 105.  Respirations 18.  Her carotid artery  reveals soft bruits bilaterally.  She is grossly intact neurologically.  She does have 2+ radial pulses bilaterally.  She does have palpable  dorsalis pedis pulses and has moderate pitting edema bilaterally.   She underwent repeat carotid duplex in our office today, and this  reveals no change from her prior studies.  She does have a 40-59% right  and 60-79% left internal carotid artery stenosis.   I discussed this with the patient.  Recommend that we continue a six-  month surveillance duplex of her carotids to rule out progression.  I  explained the symptoms of carotid disease to her, and she will notify me  should these occur, otherwise we will continue to follow her in our  vascular lab.  I also discussed the significance of her lower extremity  edema.  This does appear to be related to fluid volume.  She is on  diuretics.  I explained that she may benefit from compression garments  with support stockings, although with her size, I think that this  would  be more uncomfortable than the swelling.  She understands this is an  option if she has progressive swelling.  She currently is not having any  significant disability from the swelling in her lower extremities.  She  will see [place] in six months for vascular lab.   Larina Earthly, M.D.  Electronically Signed   TFE/MEDQ  D:  04/18/2008  T:  04/21/2008  Job:  [id number]   cc:   Atha Starks. Bean, FNP

## 2010-12-14 NOTE — H&P (Signed)
HISTORY AND PHYSICAL EXAMINATION   Dec 05, 2008   Re:  Murray, Gloria E             DOB:  25-Feb-1951   The patient presents today for continued followup of her asymptomatic  carotid disease.  She is a very pleasant 60 year old white female whom I  have seen for a number of years for serial followup of asymptomatic  carotid disease.  She continues to deny any symptoms of amaurosis fugax,  transient ischemic attack, or stroke.  She has no significant cardiac  disease.  She is not a diabetic.  She does have a history of elevated  cholesterol and does have a history of hypertension.  She has a strong  family history of premature atherosclerotic disease in her mother and  father.   SOCIAL HISTORY:  She is widowed.  She is not retired.  She does continue  to smoke 1 pack of cigarettes per day and does have social alcohol  consumption occasionally.   REVIEW OF SYSTEMS:  Her weight is reported as 235 pounds.  She is 5 feet  6-1/2 inches tall.  She denies any cardiac, pulmonary, GI symptoms.  She  is left handed.   MEDICATION ALLERGIES:  Darvocet and Darvon.   CURRENT MEDICATIONS:  Synthroid, Zocor, hydrochlorothiazide, claritin,  atenolol, and vitamin supplements.  She is on 81 mg per day of aspirin.   PHYSICAL EXAM:  Well-developed, moderately obese white female in no  acute distress.  She is grossly intact neurologically.  She has 2+  radial pulses bilaterally.  She does have moderate distal pitting edema.  Her carotid arteries reveal soft bruits bilaterally.  Heart is regular  rate and rhythm.  Chest is clear bilaterally.   She underwent repeat carotid duplex today, and this shows progression  from moderate to severe level of stenosis in her left internal carotid  artery.  Duplex criteria revealed that she does have normal internal  carotid distant to the plaque.  I discussed this at length with the  patient.  I have recommended left carotid endarterectomy for  reduction  of stroke risk.  I explained the procedure to include 1 to 2% risk of  stroke with surgery and also low risk of cranial nerve injury and other  complications.  She understands and wishes to proceed with surgery.  We  have scheduled this at her convenience on 05/13 for admission-day  surgery at [facility].   Larina Earthly, M.D.  Electronically Signed   TFE/MEDQ  D:  12/05/2008  T:  12/08/2008  Job:  [id number]   cc:   Willaim Sheng D. Bean, FNP

## 2010-12-14 NOTE — Assessment & Plan Note (Signed)
OFFICE VISIT   Shryock, Aleatha E  DOB:  1950/09/07                                       12/26/2008  [id number]#:[id number]   The patient presents today for follow-up after her left carotid  endarterectomy for severe asymptomatic left internal carotid artery  stenosis on Dec 11, 2008.  She did well and was discharged home on  postoperative day #1.  She has done well since her discharge.  She has  returned to her usual activity and has had no difficulty with wound or  with neurologic deficits.  She is neurologically intact today.  Her  incision is healing quite nicely.  She has 2+ radial pulses bilaterally.  She will return to full activity without restrictions.  She will  continue on daily aspirin and we will see her again in 6 months with  repeat carotid duplex.   Larina Earthly, M.D.  Electronically Signed   TFE/MEDQ  D:  12/26/2008  T:  12/30/2008  Job:  [id number]   cc:   Arta Silence, MD  Atha Starks. Bean, FNP

## 2010-12-14 NOTE — Procedures (Signed)
CAROTID DUPLEX EXAM   INDICATION:  Follow up carotid disease.   HISTORY:  Diabetes:  No.  Cardiac:  No.  Hypertension:  Yes.  Smoking:  Yes, 1 pack per day for 42 years.  Previous Surgery:  No.  CV History:  No.  Amaurosis Fugax No, Paresthesias No, Hemiparesis No                                       RIGHT             LEFT  Brachial systolic pressure:         100               124  Brachial Doppler waveforms:         Monophasic        Triphasic  Vertebral direction of flow:        Abnormal          Antegrade  DUPLEX VELOCITIES (cm/sec)  CCA peak systolic                   81                71  ECA peak systolic                   109               93  ICA peak systolic                   119               210  ICA end diastolic                   39                58  PLAQUE MORPHOLOGY:                  Calcified with shadowing            Calcified  PLAQUE AMOUNT:                      Moderate          Moderate  PLAQUE LOCATION:                    ICA               ICA   IMPRESSION:  1. Mild right ECA stenosis.  2. Left ICA stenosis 40-59%.  3. Right ICA stenosis 20-39% (upper end of range).  4. Calcified plaque with shadowing on the right could obscure a more      severe stenosis.  5. Known right subclavian artery stenosis.   ___________________________________________  Larina Earthly, M.D.   DP/MEDQ  D:  10/19/2007  T:  10/19/2007  Job:  [id number]

## 2010-12-17 NOTE — Op Note (Signed)
   NAME:  Gloria Murray, Gloria Murray                             ACCOUNT NO.:  [id number]   MEDICAL RECORD NO.:  [id number]                   PATIENT TYPE:  AMB   LOCATION:  DSC                                  FACILITY:  [facility]   PHYSICIAN:  Nicki Reaper, M.D.                 DATE OF BIRTH:  Jul 05, 1951   DATE OF PROCEDURE:  04/30/2002  DATE OF DISCHARGE:                                 OPERATIVE REPORT   PREOPERATIVE DIAGNOSIS:  Carpal tunnel syndrome, left hand.   POSTOPERATIVE DIAGNOSIS:  Carpal tunnel syndrome, left hand.   OPERATION:  Decompression of left medial nerve.   SURGEON:  Nicki Reaper, M.D.   ASSISTANT:  R.N.   ANESTHESIA:  Forearm based IV regional.   INDICATIONS:  The patient is a 60 year old female with a history of carpal  tunnel syndrome.  EMG and nerve conduction positive.  She has not responded  to conservative treatment.   DESCRIPTION OF PROCEDURE:  The patient was brought to the operating room  where a forearm based IV regional anesthetic was carried out without  difficulty.  She was prepped using DuraPrep.  A longitudinal incision was  made in the palm and carried down through subcutaneous tissues.  Bleeders  were electrocauterized.  The palmar fascia was split.  The superficial  palmar arch was identified.  The flexor tendon to the ring and little finger  was identified to the ulnar side of the medial nerve.  The carpi retinaculum  was incised with sharp dissection. A right ankle and Sewell retractor were  placed between skin and forearm fascia.  The fascia was released  approximately 3 cm proximal to the wrist crease under direct vision.  The  canal was explored.  No lesions were identified.  The wound was irrigated.  The skin was closed with interrupted 5-0 nylon sutures.  A sterile  compressive dressing and splint were applied.  The patient tolerated the  procedure well and was taken to the recovery room for observation in  satisfactory condition.  She was  discharged home to return to the [facility] in one week on Vicodin and Keflex.                                               Nicki Reaper, M.D.    GRK/MEDQ  D:  04/30/2002  T:  05/02/2002  Job:  [id number]

## 2011-01-04 ENCOUNTER — Other Ambulatory Visit: Payer: Self-pay

## 2011-01-10 ENCOUNTER — Other Ambulatory Visit (INDEPENDENT_AMBULATORY_CARE_PROVIDER_SITE_OTHER): Payer: PRIVATE HEALTH INSURANCE

## 2011-01-10 DIAGNOSIS — I6529 Occlusion and stenosis of unspecified carotid artery: Secondary | ICD-10-CM

## 2011-01-10 DIAGNOSIS — Z48812 Encounter for surgical aftercare following surgery on the circulatory system: Secondary | ICD-10-CM

## 2011-01-18 NOTE — Procedures (Unsigned)
CAROTID DUPLEX EXAM  INDICATION:  Follow up carotid endarterectomy.  HISTORY: Diabetes:  No Cardiac:  No Hypertension:  Yes Smoking:  Yes Previous Surgery:  Left carotid endarterectomy 12/11/2008 CV History:  Currently asymptomatic Amaurosis Fugax No, Paresthesias No, Hemiparesis No                                      RIGHT             LEFT Brachial systolic pressure:         112               138 Brachial Doppler waveforms:         abnormal          normal Vertebral direction of flow:        bi-directional    antegrade DUPLEX VELOCITIES (cm/sec) CCA peak systolic                   83                69 ECA peak systolic                   180               94 ICA peak systolic                   113               68 ICA end diastolic                   43                30 PLAQUE MORPHOLOGY:                  calcific PLAQUE AMOUNT:                      minimal/moderate  mild PLAQUE LOCATION:                    bifurcation       mid CCA  IMPRESSION: 1. Right internal carotid artery velocities suggest 40%-59% stenosis. 2. Patent left carotid endarterectomy site with no evidence of     restenosis of the internal carotid artery. 3. Bi-directional flow noted in the right vertebral artery.  ___________________________________________ Larina Earthly, M.D.  EM/MEDQ  D:  01/12/2011  T:  01/12/2011  Job:  [id number]

## 2011-03-11 ENCOUNTER — Other Ambulatory Visit: Payer: Self-pay | Admitting: *Deleted

## 2011-03-11 MED ORDER — SIMVASTATIN 80 MG PO TABS: 80.0000 mg | ORAL_TABLET | Freq: Every day | ORAL | Status: DC

## 2011-03-11 MED ORDER — HYDROCHLOROTHIAZIDE 12.5 MG PO CAPS: 12.5000 mg | ORAL_CAPSULE | Freq: Every day | ORAL | Status: DC

## 2011-03-30 ENCOUNTER — Other Ambulatory Visit: Payer: Self-pay | Admitting: *Deleted

## 2011-03-30 MED ORDER — LEVOTHYROXINE SODIUM 112 MCG PO TABS: 112.0000 ug | ORAL_TABLET | Freq: Every day | ORAL | Status: DC

## 2011-04-08 ENCOUNTER — Encounter: Payer: Self-pay | Admitting: Family Medicine

## 2011-04-08 LAB — HM MAMMOGRAPHY

## 2011-04-11 ENCOUNTER — Encounter: Payer: Self-pay | Admitting: Family Medicine

## 2011-04-11 ENCOUNTER — Ambulatory Visit (INDEPENDENT_AMBULATORY_CARE_PROVIDER_SITE_OTHER): Payer: PRIVATE HEALTH INSURANCE | Admitting: Family Medicine

## 2011-04-11 DIAGNOSIS — Z23 Encounter for immunization: Secondary | ICD-10-CM

## 2011-04-11 DIAGNOSIS — H6120 Impacted cerumen, unspecified ear: Secondary | ICD-10-CM

## 2011-04-11 DIAGNOSIS — R945 Abnormal results of liver function studies: Secondary | ICD-10-CM

## 2011-04-11 DIAGNOSIS — E782 Mixed hyperlipidemia: Secondary | ICD-10-CM

## 2011-04-11 DIAGNOSIS — I10 Essential (primary) hypertension: Secondary | ICD-10-CM

## 2011-04-11 DIAGNOSIS — E039 Hypothyroidism, unspecified: Secondary | ICD-10-CM

## 2011-04-11 DIAGNOSIS — Z1231 Encounter for screening mammogram for malignant neoplasm of breast: Secondary | ICD-10-CM

## 2011-04-11 LAB — BASIC METABOLIC PANEL
BUN: 14 mg/dL (ref 6–23)
CO2: 33 mEq/L — ABNORMAL HIGH (ref 19–32)
Chloride: 103 mEq/L (ref 96–112)
Glucose, Bld: 123 mg/dL — ABNORMAL HIGH (ref 70–99)
Potassium: 4.4 mEq/L (ref 3.5–5.1)

## 2011-04-11 LAB — HEPATIC FUNCTION PANEL
Alkaline Phosphatase: 60 U/L (ref 39–117)
Bilirubin, Direct: 0.1 mg/dL (ref 0.0–0.3)
Total Bilirubin: 0.7 mg/dL (ref 0.3–1.2)

## 2011-04-11 LAB — LIPID PANEL
LDL Cholesterol: 70 mg/dL (ref 0–99)
Total CHOL/HDL Ratio: 4
VLDL: 37.4 mg/dL (ref 0.0–40.0)

## 2011-04-11 MED ORDER — SIMVASTATIN 80 MG PO TABS: 80.0000 mg | ORAL_TABLET | Freq: Every day | ORAL | Status: DC

## 2011-04-11 MED ORDER — ATENOLOL 50 MG PO TABS: 50.0000 mg | ORAL_TABLET | Freq: Every day | ORAL | Status: DC

## 2011-04-11 MED ORDER — FISH OIL 1000 MG PO CAPS: 1.0000 | ORAL_CAPSULE | Freq: Every day | ORAL | Status: AC

## 2011-04-11 MED ORDER — LEVOTHYROXINE SODIUM 112 MCG PO TABS: 112.0000 ug | ORAL_TABLET | Freq: Every day | ORAL | Status: DC

## 2011-04-11 NOTE — Progress Notes (Signed)
60 yo pt here to renew meds.  HLD- on Zocor 80 mg daily.  s/p left CEA and stent placement 07/2009 (Dr. [name]).   Lab Results  Component Value Date   HDL 31.90* 05/17/2010   HDL 36.40* 02/08/2010   HDL 33.50* 01/11/2010   Lab Results  Component Value Date   LDLCALC 85 04/03/2009   LDLCALC 124* 02/13/2009   LDLCALC 100* 09/11/2007   Lab Results  Component Value Date   TRIG 221.0* 05/17/2010   TRIG 247.0* 02/08/2010   TRIG 343.0* 01/11/2010    Lab Results  Component Value Date   LDLDIRECT 95.9 05/17/2010   LDLDIRECT 98.7 02/08/2010   LDLDIRECT 92.4 01/11/2010    Admits to still smoking.   HTN- tolerating meds without side effects.  no HA or blurred vision.     Hypothyroidism-  Levothyroid 112 micrograms daily.  Lab Results  Component Value Date   TSH 0.76 10/12/2009   Has been a little more fatigued lately.  Cerumen impaction- difficulty hearing out of left ear.  NO pain or drainage from ear. Has seasonal allergies.  Patient Active Problem List  Diagnoses  . HYPOTHYROIDISM  . GLUCOSE INTOLERANCE  . HYPERLIPIDEMIA, MIXED, MILD  . OBESITY  . NICOTINE ADDICTION  . LOSS, HEARING NOS  . HYPERTENSION, BENIGN ESSENTIAL  . OSTEOPENIA  . EDEMA LEG  . CAROTID BRUITS, BILATERAL  . MAMMOGRAM, ABNORMAL, LEFT  . LIVER FUNCTION TESTS, ABNORMAL   Past Medical History  Diagnosis Date  . Hypertension   . Hyperlipidemia   . Osteoporosis   . Thyroid disease    Past Surgical History  Procedure Date  . Thyroidectomy   . Tubal ligation 1975    bilateral  . Dilation and curettage of uterus     X 2  . Carpal tunnel release     X 2, right wrist  . Carotid endarterectomy 12/11/2008    left   History  Substance Use Topics  . Smoking status: Current Everyday Smoker  . Smokeless tobacco: Not on file  . Alcohol Use: Not on file   Family History  Problem Relation Age of Onset  . Diabetes Mother    Allergies  Allergen Reactions  . Propoxyphene Hcl     REACTION: nausea  and itch  . Propoxyphene N-Acetaminophen     REACTION: nausea and  itch   Current Outpatient Prescriptions on File Prior to Visit  Medication Sig Dispense Refill  . Ascorbic Acid GRAN Take 1 tablet by mouth daily.        Marland Kitchen aspirin 81 MG tablet Take 81 mg by mouth daily.        . Calcium Carbonate-Vitamin D (CALCIUM-VITAMIN D) 600-200 MG-UNIT CAPS Take 1 tablet by mouth 2 (two) times daily.        . hydrochlorothiazide (,MICROZIDE/HYDRODIURIL,) 12.5 MG capsule Take 1 capsule (12.5 mg total) by mouth daily.  60 capsule  0  . loratadine (CLARITIN) 10 MG tablet Take 10 mg by mouth as needed.         The PMH, PSH, Social History, Family History, Medications, and allergies have been reviewed in [facility], and have been updated if relevant.  ROS: See HPI  Physical exam: BP 102/80  Pulse 88  Temp(Src) 98.6 F (37 C) (Oral)  Wt 241 lb 12 oz (109.657 kg) General:  alert, well-developed, well-nourished, well-hydrated, and overweight-appearing.   Neck:  no thyromegaly and no JVD. Lungs:  normal respiratory effort, no intercostal retractions, no accessory muscle use, and normal  breath sounds.   Heart:  normal rate, regular rhythm, and no murmur.   Extremities:  Trace edema bilaterally   No warmth or erythema, NTTP Neurologic:  alert & oriented X3 and gait normal.   Psych:  normally interactive, not anxious appearing, and not depressed appearing.    Assessment and Plan:  1. HYPOTHYROIDISM   Deteriorated. Recheck labs today. TSH, T4, free, levothyroxine (SYNTHROID, LEVOTHROID) 112 MCG tablet  2. HYPERTENSION, BENIGN ESSENTIAL  Stable.  Recheck BMET today Basic Metabolic Panel (BMET), atenolol (TENORMIN) 50 MG tablet  3. LIVER FUNCTION TESTS, ABNORMAL  Hepatic function panel  4. HYPERLIPIDEMIA, MIXED, MILD  Lipid Profile, simvastatin (ZOCOR) 80 MG tablet, Omega-3 Fatty Acids (FISH OIL) 1000 MG CAPS   5. Cerumen impaction     Ceruminosis is noted.  Wax is removed by syringing and manual  debridement. Instructions for home care to prevent wax buildup are given.

## 2011-04-11 NOTE — Patient Instructions (Signed)
Good to see you. Please stop by to see Gloria Murray on your way out. 

## 2011-04-18 ENCOUNTER — Other Ambulatory Visit: Payer: Self-pay | Admitting: Family Medicine

## 2011-04-18 ENCOUNTER — Ambulatory Visit
Admission: RE | Admit: 2011-04-18 | Discharge: 2011-04-18 | Disposition: A | Discharge status: Home / Self Care | Payer: PRIVATE HEALTH INSURANCE | Source: Ambulatory Visit | Attending: Family Medicine | Admitting: Family Medicine

## 2011-04-18 ENCOUNTER — Other Ambulatory Visit: Payer: Self-pay | Admitting: *Deleted

## 2011-04-18 DIAGNOSIS — I10 Essential (primary) hypertension: Secondary | ICD-10-CM

## 2011-04-18 DIAGNOSIS — Z1231 Encounter for screening mammogram for malignant neoplasm of breast: Secondary | ICD-10-CM

## 2011-04-18 MED ORDER — ATENOLOL 50 MG PO TABS
50.0000 mg | ORAL_TABLET | Freq: Every day | ORAL | Status: DC
Start: 2011-04-18 — End: 2012-03-29

## 2011-04-19 ENCOUNTER — Encounter: Payer: Self-pay | Admitting: *Deleted

## 2011-05-13 ENCOUNTER — Other Ambulatory Visit: Payer: Self-pay | Admitting: Family Medicine

## 2011-05-13 DIAGNOSIS — E039 Hypothyroidism, unspecified: Secondary | ICD-10-CM

## 2011-05-13 MED ORDER — LEVOTHYROXINE SODIUM 112 MCG PO TABS: 112.0000 ug | ORAL_TABLET | Freq: Every day | ORAL | Status: DC

## 2011-12-19 ENCOUNTER — Telehealth: Payer: Self-pay | Admitting: Vascular Surgery

## 2012-01-16 ENCOUNTER — Ambulatory Visit (INDEPENDENT_AMBULATORY_CARE_PROVIDER_SITE_OTHER): Payer: PRIVATE HEALTH INSURANCE | Admitting: *Deleted

## 2012-01-16 DIAGNOSIS — Z48812 Encounter for surgical aftercare following surgery on the circulatory system: Secondary | ICD-10-CM

## 2012-01-16 DIAGNOSIS — I6529 Occlusion and stenosis of unspecified carotid artery: Secondary | ICD-10-CM

## 2012-01-19 ENCOUNTER — Other Ambulatory Visit: Payer: Self-pay | Admitting: *Deleted

## 2012-01-19 DIAGNOSIS — I6529 Occlusion and stenosis of unspecified carotid artery: Secondary | ICD-10-CM

## 2012-01-19 DIAGNOSIS — Z48812 Encounter for surgical aftercare following surgery on the circulatory system: Secondary | ICD-10-CM

## 2012-01-23 ENCOUNTER — Encounter: Payer: Self-pay | Admitting: Vascular Surgery

## 2012-01-23 NOTE — Procedures (Unsigned)
CAROTID DUPLEX EXAM  INDICATION:  Followup left carotid endarterectomy.  HISTORY: Diabetes:  No Cardiac:  No Hypertension:  Yes Smoking:  Yes Previous Surgery:  Left carotid endarterectomy 12/11/2008 CV History:  Asymptomatic Amaurosis Fugax No, Paresthesias No, Hemiparesis No                                      RIGHT             LEFT Brachial systolic pressure:         80                124 Brachial Doppler waveforms:         Monophasic        Biphasic Vertebral direction of flow:        Bidirectional     Antegrade DUPLEX VELOCITIES (cm/sec) CCA peak systolic                   91                131 (distal CCA/CE site) ECA peak systolic                   125               73 ICA peak systolic                   163               79 (mid) ICA end diastolic                   74                38 PLAQUE MORPHOLOGY:                  Heterogenous PLAQUE AMOUNT:                      Moderate PLAQUE LOCATION:                    Bifurcation ICA and ECA  IMPRESSION:  60% to 79% right ICA stenosis with plaque as mentioned above.  ICA is tortuous. Abnormal right vertebral artery flow. Patent left carotid endarterectomy site with no evidence for restenosis. Antegrade left vertebral artery flow. Note:  Increased velocity on the right since previous study of 01/10/2011.  ___________________________________________ Larina Earthly, M.D.  SS/MEDQ  D:  01/16/2012  T:  01/16/2012  Job:  [id number]

## 2012-01-25 ENCOUNTER — Other Ambulatory Visit: Payer: PRIVATE HEALTH INSURANCE

## 2012-03-29 ENCOUNTER — Other Ambulatory Visit: Payer: Self-pay

## 2012-03-29 DIAGNOSIS — E039 Hypothyroidism, unspecified: Secondary | ICD-10-CM

## 2012-03-29 DIAGNOSIS — I10 Essential (primary) hypertension: Secondary | ICD-10-CM

## 2012-03-29 MED ORDER — SIMVASTATIN 80 MG PO TABS: 80.0000 mg | ORAL_TABLET | Freq: Every day | ORAL | Status: DC

## 2012-03-29 MED ORDER — LEVOTHYROXINE SODIUM 112 MCG PO TABS: 112.0000 ug | ORAL_TABLET | Freq: Every day | ORAL | Status: DC

## 2012-03-29 MED ORDER — HYDROCHLOROTHIAZIDE 12.5 MG PO CAPS: 12.5000 mg | ORAL_CAPSULE | Freq: Every day | ORAL | Status: DC

## 2012-03-29 MED ORDER — ATENOLOL 50 MG PO TABS: 50.0000 mg | ORAL_TABLET | Freq: Every day | ORAL | Status: DC

## 2012-03-29 NOTE — Telephone Encounter (Signed)
Pt left v/m requesting refills HCTZ,Simvastatin,atenolol,levothyroxine to [facility] [place] with note pt to call for appt.ptnotified by v/m.

## 2012-04-16 ENCOUNTER — Encounter: Payer: Self-pay | Admitting: Family Medicine

## 2012-04-16 ENCOUNTER — Ambulatory Visit (INDEPENDENT_AMBULATORY_CARE_PROVIDER_SITE_OTHER): Payer: PRIVATE HEALTH INSURANCE | Admitting: Family Medicine

## 2012-04-16 VITALS — BP 142/78 | HR 88 | Temp 98.0°F | Wt 242.0 lb

## 2012-04-16 DIAGNOSIS — Z1231 Encounter for screening mammogram for malignant neoplasm of breast: Secondary | ICD-10-CM

## 2012-04-16 DIAGNOSIS — I10 Essential (primary) hypertension: Secondary | ICD-10-CM

## 2012-04-16 DIAGNOSIS — Z23 Encounter for immunization: Secondary | ICD-10-CM

## 2012-04-16 DIAGNOSIS — E039 Hypothyroidism, unspecified: Secondary | ICD-10-CM

## 2012-04-16 DIAGNOSIS — E782 Mixed hyperlipidemia: Secondary | ICD-10-CM

## 2012-04-16 LAB — COMPREHENSIVE METABOLIC PANEL
ALT: 23 U/L (ref 0–35)
Alkaline Phosphatase: 71 U/L (ref 39–117)
CO2: 31 mEq/L (ref 19–32)
Creatinine, Ser: 0.9 mg/dL (ref 0.4–1.2)
GFR: 70.85 mL/min (ref >60.00)
Total Bilirubin: 0.7 mg/dL (ref 0.3–1.2)

## 2012-04-16 LAB — TSH: TSH: 5.68 u[IU]/mL — ABNORMAL HIGH (ref 0.35–5.50)

## 2012-04-16 LAB — LIPID PANEL
HDL: 31.6 mg/dL — ABNORMAL LOW (ref >39.00)
LDL Cholesterol: 89 mg/dL (ref 0–99)
Total CHOL/HDL Ratio: 5
Triglycerides: 169 mg/dL — ABNORMAL HIGH (ref 0.0–149.0)
VLDL: 33.8 mg/dL (ref 0.0–40.0)

## 2012-04-16 NOTE — Progress Notes (Signed)
61 yo pt with h/o HLD, carotid stenosis, HTN, tobacco abuse, here to renew meds.  HLD- on Zocor 80 mg daily.  s/p left CEA and stent placement 07/2009 (Dr. [name]).   Carotid duplex 12/2011:  IMPRESSION: 60% to 79% right ICA stenosis with plaque as mentioned  above. ICA is tortuous.  Abnormal right vertebral artery flow.  Patent left carotid endarterectomy site with no evidence for restenosis.  Antegrade left vertebral artery flow.  Note: Increased velocity on the right since previous study of  01/10/2011.  Lab Results  Component Value Date   CHOL 143 04/11/2011   HDL 35.50* 04/11/2011   LDLCALC 70 04/11/2011   LDLDIRECT 95.9 05/17/2010   TRIG 187.0* 04/11/2011   CHOLHDL 4 04/11/2011   She is still smoking but cut back to 6 cigs/day. Not ready to quit.  HTN- tolerating meds without side effects.  no HA or blurred vision.     Hypothyroidism-  Levothyroid 112 micrograms daily.  Denies any symptoms of hypo or hyperthyroidism.  Lab Results  Component Value Date   TSH 1.12 04/11/2011      Patient Active Problem List  Diagnosis  . HYPOTHYROIDISM  . GLUCOSE INTOLERANCE  . HYPERLIPIDEMIA, MIXED, MILD  . OBESITY  . NICOTINE ADDICTION  . LOSS, HEARING NOS  . HYPERTENSION, BENIGN ESSENTIAL  . OSTEOPENIA  . EDEMA LEG  . CAROTID BRUITS, BILATERAL  . MAMMOGRAM, ABNORMAL, LEFT  . LIVER FUNCTION TESTS, ABNORMAL  . Cerumen impaction   Past Medical History  Diagnosis Date  . Hypertension   . Hyperlipidemia   . Osteoporosis   . Thyroid disease    Past Surgical History  Procedure Date  . Thyroidectomy   . Tubal ligation 1975    bilateral  . Dilation and curettage of uterus     X 2  . Carpal tunnel release     X 2, right wrist  . Carotid endarterectomy 12/11/2008    left   History  Substance Use Topics  . Smoking status: Current Every Day Smoker  . Smokeless tobacco: Not on file  . Alcohol Use: Not on file   Family History  Problem Relation Age of Onset  .  Diabetes Mother    Allergies  Allergen Reactions  . Propoxyphene Hcl     REACTION: nausea and itch  . Propoxyphene-Acetaminophen     REACTION: nausea and  itch   Current Outpatient Prescriptions on File Prior to Visit  Medication Sig Dispense Refill  . Ascorbic Acid GRAN Take 1 tablet by mouth daily.        Marland Kitchen aspirin 81 MG tablet Take 81 mg by mouth daily.        Marland Kitchen atenolol (TENORMIN) 50 MG tablet Take 1 tablet (50 mg total) by mouth daily.  30 tablet  0  . Calcium Carbonate-Vitamin D (CALCIUM-VITAMIN D) 600-200 MG-UNIT CAPS Take 1 tablet by mouth 2 (two) times daily.        . Garlic 1000 MG CAPS Take 1 capsule by mouth 2 (two) times daily.        . hydrochlorothiazide (MICROZIDE) 12.5 MG capsule Take 1 capsule (12.5 mg total) by mouth daily.  30 capsule  0  . levothyroxine (SYNTHROID, LEVOTHROID) 112 MCG tablet Take 1 tablet (112 mcg total) by mouth daily.  90 tablet  0  . loratadine (CLARITIN) 10 MG tablet Take 10 mg by mouth as needed.        . Omega-3 Fatty Acids (FISH OIL) 1000 MG CAPS Take  1 capsule (1,000 mg total) by mouth daily.      . simvastatin (ZOCOR) 80 MG tablet Take 1 tablet (80 mg total) by mouth at bedtime.  30 tablet  0  . simvastatin (ZOCOR) 80 MG tablet Take 1 tablet (80 mg total) by mouth at bedtime.  30 tablet  0   The PMH, PSH, Social History, Family History, Medications, and allergies have been reviewed in [facility], and have been updated if relevant.  ROS: See HPI  Physical exam: BP 142/78  Pulse 88  Temp 98 F (36.7 C)  Wt 242 lb (109.77 kg)  General:  alert, well-developed, well-nourished, well-hydrated, and overweight-appearing.   Neck:  no thyromegaly and no JVD. Lungs:  normal respiratory effort, no intercostal retractions, no accessory muscle use, and normal breath sounds.   Heart:  normal rate, regular rhythm, and no murmur.   Extremities:  Trace edema bilaterally   No warmth or erythema, NTTP Neurologic:  alert & oriented X3 and gait normal.     Psych:  normally interactive, not anxious appearing, and not depressed appearing.    Assessment and Plan:  1. HYPOTHYROIDISM   Stable. Recheck labs today. TSH, T4, free, levothyroxine (SYNTHROID, LEVOTHROID) 112 MCG tablet  2. HYPERTENSION, BENIGN ESSENTIAL  Stable.  Recheck BMET today Basic Metabolic Panel (BMET), atenolol (TENORMIN) 50 MG tablet  3. LIVER FUNCTION TESTS, ABNORMAL  Hepatic function panel  4. HYPERLIPIDEMIA, MIXED, MILD  Recheck labs today. Lipid Profile, simvastatin (ZOCOR) 80 MG tablet, Omega-3 Fatty Acids (FISH OIL) 1000 MG CAPS

## 2012-04-16 NOTE — Patient Instructions (Addendum)
Good to see you. Please stop by to see Gloria Murray on your way out to set up your mammogram.  We will call you with lab results.    Try Debrox over the counter for your wax.

## 2012-04-16 NOTE — Addendum Note (Signed)
Addended by: Eliezer Bottom on: 04/16/2012 09:15 AM   Modules accepted: Orders

## 2012-04-30 ENCOUNTER — Other Ambulatory Visit: Payer: Self-pay | Admitting: Family Medicine

## 2012-05-07 ENCOUNTER — Ambulatory Visit
Admission: RE | Admit: 2012-05-07 | Discharge: 2012-05-07 | Disposition: A | Discharge status: Home / Self Care | Payer: PRIVATE HEALTH INSURANCE | Source: Ambulatory Visit | Attending: Family Medicine | Admitting: Family Medicine

## 2012-05-07 DIAGNOSIS — Z1231 Encounter for screening mammogram for malignant neoplasm of breast: Secondary | ICD-10-CM

## 2012-05-26 ENCOUNTER — Other Ambulatory Visit: Payer: Self-pay | Admitting: Family Medicine

## 2012-07-06 ENCOUNTER — Encounter: Payer: Self-pay | Admitting: Neurosurgery

## 2012-07-09 ENCOUNTER — Ambulatory Visit (INDEPENDENT_AMBULATORY_CARE_PROVIDER_SITE_OTHER): Payer: PRIVATE HEALTH INSURANCE | Admitting: Neurosurgery

## 2012-07-09 ENCOUNTER — Other Ambulatory Visit (INDEPENDENT_AMBULATORY_CARE_PROVIDER_SITE_OTHER): Payer: PRIVATE HEALTH INSURANCE | Admitting: *Deleted

## 2012-07-09 ENCOUNTER — Encounter: Payer: Self-pay | Admitting: Neurosurgery

## 2012-07-09 VITALS — BP 124/84 | HR 81 | Resp 16 | Ht 66.0 in | Wt 237.0 lb

## 2012-07-09 DIAGNOSIS — I6529 Occlusion and stenosis of unspecified carotid artery: Secondary | ICD-10-CM

## 2012-07-09 DIAGNOSIS — Z48812 Encounter for surgical aftercare following surgery on the circulatory system: Secondary | ICD-10-CM

## 2012-07-09 NOTE — Progress Notes (Signed)
[facility] Carotid Office Note  CC: Carotid surveillance Referring Physician: Early 61 year old female patient of Dr. [name]. The patient denies any signs or symptoms of CVA, TIA, amaurosis fugax or neural deficit. The patient denies any new medical diagnoses or recent surgery.  History of Present Illness:  61 year old female patient of Dr. Arbie Murray status post left CEA in may of 2010. The patient denies any signs or symptoms of CVA, TIA, amaurosis fugax or neural deficit. The patient denies any new medical diagnoses or recent surgery.   Past Medical History  Diagnosis Date  . Hypertension   . Hyperlipidemia   . Osteoporosis   . Thyroid disease   . Carotid artery occlusion     ROS: [x]  Positive   [ ]  Denies    General: [ ]  Weight loss, [ ]  Fever, [ ]  chills Neurologic: [ ]  Dizziness, [ ]  Blackouts, [ ]  Seizure [ ]  Stroke, [ ]  "Mini stroke", [ ]  Slurred speech, [ ]  Temporary blindness; [ ]  weakness in arms or legs, [ ]  Hoarseness Cardiac: [ ]  Chest pain/pressure, [ ]  Shortness of breath at rest [ ]  Shortness of breath with exertion, [ ]  Atrial fibrillation or irregular heartbeat Vascular: [ ]  Pain in legs with walking, [ ]  Pain in legs at rest, [ ]  Pain in legs at night,  [ ]  Non-healing ulcer, [ ]  Blood clot in vein/DVT,   Pulmonary: [ ]  Home oxygen, [ ]  Productive cough, [ ]  Coughing up blood, [ ]  Asthma,  [ ]  Wheezing Musculoskeletal:  [ ]  Arthritis, [ ]  Low back pain, [ ]  Joint pain Hematologic: [ ]  Easy Bruising, [ ]  Anemia; [ ]  Hepatitis Gastrointestinal: [ ]  Blood in stool, [ ]  Gastroesophageal Reflux/heartburn, [ ]  Trouble swallowing Urinary: [ ]  chronic Kidney disease, [ ]  on HD - [ ]  MWF or [ ]  TTHS, [ ]  Burning with urination, [ ]  Difficulty urinating Skin: [ ]  Rashes, [ ]  Wounds Psychological: [ ]  Anxiety, [ ]  Depression   Social History History  Substance Use Topics  . Smoking status: Current Every Day  Smoker  . Smokeless tobacco: Not on file  . Alcohol Use: No    Family History Family History  Problem Relation Age of Onset  . Diabetes Mother     Allergies  Allergen Reactions  . Propoxyphene Hcl     REACTION: nausea and itch  . Propoxyphene-Acetaminophen     REACTION: nausea and  itch    Current Outpatient Prescriptions  Medication Sig Dispense Refill  . Ascorbic Acid GRAN Take 1 tablet by mouth daily.        Marland Kitchen aspirin 81 MG tablet Take 81 mg by mouth daily.        Marland Kitchen atenolol (TENORMIN) 50 MG tablet Take one by mouth daily.  30 tablet  11  . Calcium Carbonate-Vitamin D (CALCIUM-VITAMIN D) 600-200 MG-UNIT CAPS Take 1 tablet by mouth 2 (two) times daily.        . Garlic 1000 MG CAPS Take 1 capsule by mouth 2 (two) times daily.        . hydrochlorothiazide (MICROZIDE) 12.5 MG capsule Take 1 capsule (12.5 mg total) by mouth daily.  30 capsule  0  . hydrochlorothiazide (MICROZIDE) 12.5 MG capsule TAKE ONE CAPSULE BY MOUTH EVERY DAY  30 capsule  11  . levothyroxine (SYNTHROID, LEVOTHROID) 112 MCG tablet Take one by mouth daily  90 tablet  3  .  loratadine (CLARITIN) 10 MG tablet Take 10 mg by mouth as needed.        . Omega-3 Fatty Acids (FISH OIL) 1000 MG CAPS Take 1 capsule (1,000 mg total) by mouth daily.      . simvastatin (ZOCOR) 80 MG tablet Take one by mouth at bedtime.  30 tablet  11    Physical Examination  Filed Vitals:   07/09/12 1451  BP:   Pulse: 81  Resp:     Body mass index is 38.25 kg/(m^2).  General:  WDWN in NAD Gait: Normal HEENT: WNL Eyes: Pupils equal Pulmonary: normal non-labored breathing , without Rales, rhonchi,  wheezing Cardiac: RRR, without  Murmurs, rubs or gallops; Abdomen: soft, NT, no masses Skin: no rashes, ulcers noted  Vascular Exam Pulses: 3+ radial pulses bilaterally Carotid bruits: Carotid pulses to auscultation no bruits are heard Extremities without ischemic changes, no Gangrene , no cellulitis; no open wounds;   Musculoskeletal: no muscle wasting or atrophy   Neurologic: A&O X 3; Appropriate Affect ; SENSATION: normal; MOTOR FUNCTION:  moving all extremities equally. Speech is fluent/normal  Non-Invasive Vascular Imaging CAROTID DUPLEX 07/09/2012  Right ICA 40 - 59 % stenosis Left ICA 0 - 19% stenosis   ASSESSMENT/PLAN: Asymptomatic patient has been downgraded in her right ICA stenosis from June of this year when she was 60-79% down to the current 40-59%, however we will recheck her in 6 months due to the previous 60-79%. The patient's questions were encouraged and answered, she is in agreement with this plan.  Gloria Murray ANP   Clinic MD: Myra Gianotti

## 2012-07-17 ENCOUNTER — Other Ambulatory Visit: Payer: PRIVATE HEALTH INSURANCE

## 2012-07-17 ENCOUNTER — Ambulatory Visit: Payer: PRIVATE HEALTH INSURANCE | Admitting: Neurosurgery

## 2012-08-20 ENCOUNTER — Other Ambulatory Visit: Payer: Self-pay | Admitting: *Deleted

## 2012-08-20 DIAGNOSIS — I6529 Occlusion and stenosis of unspecified carotid artery: Secondary | ICD-10-CM

## 2013-01-04 ENCOUNTER — Other Ambulatory Visit (INDEPENDENT_AMBULATORY_CARE_PROVIDER_SITE_OTHER): Payer: Self-pay | Admitting: *Deleted

## 2013-01-04 DIAGNOSIS — I6529 Occlusion and stenosis of unspecified carotid artery: Secondary | ICD-10-CM

## 2013-01-04 DIAGNOSIS — Z48812 Encounter for surgical aftercare following surgery on the circulatory system: Secondary | ICD-10-CM

## 2013-01-08 ENCOUNTER — Other Ambulatory Visit: Payer: PRIVATE HEALTH INSURANCE

## 2013-01-08 ENCOUNTER — Ambulatory Visit: Payer: PRIVATE HEALTH INSURANCE | Admitting: Neurosurgery

## 2013-01-17 ENCOUNTER — Other Ambulatory Visit: Payer: Self-pay

## 2013-01-17 DIAGNOSIS — Z48812 Encounter for surgical aftercare following surgery on the circulatory system: Secondary | ICD-10-CM

## 2013-01-18 ENCOUNTER — Encounter: Payer: Self-pay | Admitting: Vascular Surgery

## 2013-04-22 ENCOUNTER — Other Ambulatory Visit: Payer: Self-pay

## 2013-04-22 DIAGNOSIS — Z1231 Encounter for screening mammogram for malignant neoplasm of breast: Secondary | ICD-10-CM

## 2013-05-02 ENCOUNTER — Other Ambulatory Visit: Payer: Self-pay | Admitting: *Deleted

## 2013-05-02 MED ORDER — SIMVASTATIN 80 MG PO TABS: ORAL_TABLET | ORAL | Status: DC

## 2013-05-02 MED ORDER — LEVOTHYROXINE SODIUM 112 MCG PO TABS: ORAL_TABLET | ORAL | Status: DC

## 2013-05-02 MED ORDER — HYDROCHLOROTHIAZIDE 12.5 MG PO CAPS: 12.5000 mg | ORAL_CAPSULE | Freq: Every day | ORAL | Status: DC

## 2013-05-02 MED ORDER — ATENOLOL 50 MG PO TABS: ORAL_TABLET | ORAL | Status: DC

## 2013-05-02 NOTE — Telephone Encounter (Signed)
rx sent to pharmacy by e-script Pt scheduled appt for CPX

## 2013-05-02 NOTE — Telephone Encounter (Signed)
Pt left message that she needs all her meds refilled (she didn't say which ones) last seen 04/2012, ok to fill?

## 2013-05-02 NOTE — Telephone Encounter (Signed)
Ok to refill one month only.  Needs to be seen for CPX for further refills. 

## 2013-05-17 ENCOUNTER — Ambulatory Visit (INDEPENDENT_AMBULATORY_CARE_PROVIDER_SITE_OTHER): Payer: PRIVATE HEALTH INSURANCE | Admitting: Family Medicine

## 2013-05-17 ENCOUNTER — Encounter: Payer: Self-pay | Admitting: Family Medicine

## 2013-05-17 ENCOUNTER — Telehealth: Payer: Self-pay | Admitting: Family Medicine

## 2013-05-17 ENCOUNTER — Other Ambulatory Visit (HOSPITAL_COMMUNITY)
Admission: RE | Admit: 2013-05-17 | Discharge: 2013-05-17 | Disposition: A | Discharge status: Home / Self Care | Payer: PRIVATE HEALTH INSURANCE | Source: Ambulatory Visit | Attending: Family Medicine | Admitting: Family Medicine

## 2013-05-17 ENCOUNTER — Ambulatory Visit
Admission: RE | Admit: 2013-05-17 | Discharge: 2013-05-17 | Disposition: A | Discharge status: Home / Self Care | Payer: PRIVATE HEALTH INSURANCE | Source: Ambulatory Visit

## 2013-05-17 VITALS — BP 152/98 | HR 81 | Temp 97.9°F | Ht 66.5 in | Wt 224.8 lb

## 2013-05-17 DIAGNOSIS — E739 Lactose intolerance, unspecified: Secondary | ICD-10-CM

## 2013-05-17 DIAGNOSIS — Z0000 Encounter for general adult medical examination without abnormal findings: Secondary | ICD-10-CM

## 2013-05-17 DIAGNOSIS — Z01419 Encounter for gynecological examination (general) (routine) without abnormal findings: Secondary | ICD-10-CM | POA: Insufficient documentation

## 2013-05-17 DIAGNOSIS — Z1151 Encounter for screening for human papillomavirus (HPV): Secondary | ICD-10-CM | POA: Insufficient documentation

## 2013-05-17 DIAGNOSIS — Z1211 Encounter for screening for malignant neoplasm of colon: Secondary | ICD-10-CM

## 2013-05-17 DIAGNOSIS — Z136 Encounter for screening for cardiovascular disorders: Secondary | ICD-10-CM

## 2013-05-17 DIAGNOSIS — I6529 Occlusion and stenosis of unspecified carotid artery: Secondary | ICD-10-CM

## 2013-05-17 DIAGNOSIS — I10 Essential (primary) hypertension: Secondary | ICD-10-CM

## 2013-05-17 DIAGNOSIS — E782 Mixed hyperlipidemia: Secondary | ICD-10-CM

## 2013-05-17 DIAGNOSIS — E039 Hypothyroidism, unspecified: Secondary | ICD-10-CM

## 2013-05-17 DIAGNOSIS — Z23 Encounter for immunization: Secondary | ICD-10-CM

## 2013-05-17 DIAGNOSIS — M899 Disorder of bone, unspecified: Secondary | ICD-10-CM

## 2013-05-17 DIAGNOSIS — Z1231 Encounter for screening mammogram for malignant neoplasm of breast: Secondary | ICD-10-CM

## 2013-05-17 LAB — COMPREHENSIVE METABOLIC PANEL
AST: 18 U/L (ref 0–37)
Alkaline Phosphatase: 64 U/L (ref 39–117)
BUN: 13 mg/dL (ref 6–23)
Creatinine, Ser: 0.8 mg/dL (ref 0.4–1.2)

## 2013-05-17 LAB — CBC WITH DIFFERENTIAL/PLATELET
Eosinophils Relative: 2.4 % (ref 0.0–5.0)
HCT: 42.4 % (ref 36.0–46.0)
Lymphs Abs: 1.7 10*3/uL (ref 0.7–4.0)
Monocytes Relative: 6.7 % (ref 3.0–12.0)
Neutrophils Relative %: 55.2 % (ref 43.0–77.0)
Platelets: 282 10*3/uL (ref 150.0–400.0)
RBC: 4.6 Mil/uL (ref 3.87–5.11)
WBC: 4.9 10*3/uL (ref 4.5–10.5)

## 2013-05-17 LAB — T4, FREE: Free T4: 1.15 ng/dL (ref 0.60–1.60)

## 2013-05-17 LAB — LIPID PANEL
Cholesterol: 143 mg/dL (ref 0–200)
LDL Cholesterol: 83 mg/dL (ref 0–99)
Triglycerides: 128 mg/dL (ref 0.0–149.0)
VLDL: 25.6 mg/dL (ref 0.0–40.0)

## 2013-05-17 NOTE — Progress Notes (Signed)
62 yo pleasant female with h/o HLD, carotid stenosis, HTN, tobacco abuse, here for CPX.  Had mammogram this am. Due for pap smear.  No h/o abnormal pap smears. LMP 18 years ago.  No h/o post menopausal bleeding.   Has never had a colonoscopy.  Denies any blood in her stool (unless she is constipated) or changes in bowel habits.  HLD- on Zocor 80 mg daily.  s/p left CEA and stent placement 07/2009 (Dr. [name]).  Has appt with Dr. [name] next year.  Lab Results  Component Value Date   CHOL 143 04/11/2011   HDL 35.50* 04/11/2011   LDLCALC 70 04/11/2011   LDLDIRECT 95.9 05/17/2010   TRIG 187.0* 04/11/2011   CHOLHDL 4 04/11/2011   She is still smoking but cut back to 6 cigs/day. Not ready to quit.  HTN- tolerating meds without side effects.  no HA or blurred vision.     Hypothyroidism-  Levothyroid 112 micrograms daily.  She does feel like maybe her thyroid is off.  More tired lately.   Denies any other symptoms of hypo or hyperthyroidism.  Lab Results  Component Value Date   TSH 1.12 04/11/2011      Patient Active Problem List  Diagnosis  . HYPOTHYROIDISM  . GLUCOSE INTOLERANCE  . HYPERLIPIDEMIA, MIXED, MILD  . OBESITY  . NICOTINE ADDICTION  . LOSS, HEARING NOS  . HYPERTENSION, BENIGN ESSENTIAL  . OSTEOPENIA  . EDEMA LEG  . CAROTID BRUITS, BILATERAL  . MAMMOGRAM, ABNORMAL, LEFT  . LIVER FUNCTION TESTS, ABNORMAL  . Cerumen impaction   Past Medical History  Diagnosis Date  . Hypertension   . Hyperlipidemia   . Osteoporosis   . Thyroid disease    Past Surgical History  Procedure Date  . Thyroidectomy   . Tubal ligation 1975    bilateral  . Dilation and curettage of uterus     X 2  . Carpal tunnel release     X 2, right wrist  . Carotid endarterectomy 12/11/2008    left   History  Substance Use Topics  . Smoking status: Current Every Day Smoker  . Smokeless tobacco: Not on file  . Alcohol Use: Not on file   Family History  Problem Relation Age of Onset   . Diabetes Mother    Allergies  Allergen Reactions  . Propoxyphene Hcl     REACTION: nausea and itch  . Propoxyphene-Acetaminophen     REACTION: nausea and  itch   Current Outpatient Prescriptions on File Prior to Visit  Medication Sig Dispense Refill  . Ascorbic Acid GRAN Take 1 tablet by mouth daily.        Marland Kitchen aspirin 81 MG tablet Take 81 mg by mouth daily.        Marland Kitchen atenolol (TENORMIN) 50 MG tablet Take 1 tablet (50 mg total) by mouth daily.  30 tablet  0  . Calcium Carbonate-Vitamin D (CALCIUM-VITAMIN D) 600-200 MG-UNIT CAPS Take 1 tablet by mouth 2 (two) times daily.        . Garlic 1000 MG CAPS Take 1 capsule by mouth 2 (two) times daily.        . hydrochlorothiazide (MICROZIDE) 12.5 MG capsule Take 1 capsule (12.5 mg total) by mouth daily.  30 capsule  0  . levothyroxine (SYNTHROID, LEVOTHROID) 112 MCG tablet Take 1 tablet (112 mcg total) by mouth daily.  90 tablet  0  . loratadine (CLARITIN) 10 MG tablet Take 10 mg by mouth as needed.        Marland Kitchen  Omega-3 Fatty Acids (FISH OIL) 1000 MG CAPS Take 1 capsule (1,000 mg total) by mouth daily.      . simvastatin (ZOCOR) 80 MG tablet Take 1 tablet (80 mg total) by mouth at bedtime.  30 tablet  0  . simvastatin (ZOCOR) 80 MG tablet Take 1 tablet (80 mg total) by mouth at bedtime.  30 tablet  0   The PMH, PSH, Social History, Family History, Medications, and allergies have been reviewed in [facility], and have been updated if relevant.  ROS: See HPI  Physical exam: BP 152/98  Pulse 81  Temp(Src) 97.9 F (36.6 C) (Oral)  Ht 5' 6.5" (1.689 m)  Wt 224 lb 12 oz (101.946 kg)  BMI 35.74 kg/m2  SpO2 97%   General:  Well-developed,well-nourished,in no acute distress; alert,appropriate and cooperative throughout examination Head:  normocephalic and atraumatic.   Well healed vertical surgical scar on neck, no bruits bilaterally Eyes:  vision grossly intact, pupils equal, pupils round, and pupils reactive to light.   Ears:  R ear normal and L ear  normal.   Nose:  no external deformity.   Mouth:  good dentition.   Neck:  No deformities, masses, or tenderness noted. Breasts:  No mass, nodules, thickening, tenderness, bulging, retraction, inflamation, nipple discharge or skin changes noted.   Lungs:  Normal respiratory effort, chest expands symmetrically. Lungs are clear to auscultation, no crackles or wheezes. Heart:  Normal rate and regular rhythm. S1 and S2 normal without gallop, murmur, click, rub or other extra sounds. Abdomen:  Bowel sounds positive,abdomen soft and non-tender without masses, organomegaly or hernias noted. Rectal:  no external abnormalities.   Genitalia:  Pelvic Exam:        External: normal female genitalia without lesions or masses        Vagina: normal without lesions or masses        Cervix: normal without lesions or masses        Adnexa: normal bimanual exam without masses or fullness        Uterus: normal by palpation        Pap smear: performed Msk:  No deformity or scoliosis noted of thoracic or lumbar spine.   Extremities:  No clubbing, cyanosis, edema, or deformity noted with normal full range of motion of all joints.   Neurologic:  alert & oriented X3 and gait normal.   Skin:  Intact without suspicious lesions or rashes Cervical Nodes:  No lymphadenopathy noted Axillary Nodes:  No palpable lymphadenopathy Psych:  Cognition and judgment appear intact. Alert and cooperative with normal attention span and concentration. No apparent delusions, illusions, hallucinations    Assessment and Plan:  1. HYPOTHYROIDISM Due for labs today. Has complaints of fatigue. Recheck thyroid panel today. - TSH - T4, free  2. HYPERTENSION, BENIGN ESSENTIAL A little elevated today but per pt has been stable. No changes today.  3. HYPERLIPIDEMIA, MIXED, MILD On Zocor. Check lipid panel today. - Lipid Panel  4. Occlusion and stenosis of carotid artery without mention of cerebral infarction, unspecified  laterality Followed by Dr. [name]   6. Routine general medical examination at a health care facility Reviewed preventive care protocols, scheduled due services, and updated immunizations Discussed nutrition, exercise, diet, and healthy lifestyle.  Pap smear today Flu shot Set up colonoscopy - CBC with Differential - Comprehensive metabolic panel - Cytology - PAP - Ambulatory referral to Gastroenterology

## 2013-05-17 NOTE — Patient Instructions (Signed)
Great to see you. We will call you with your lab results and your colonoscopy appointment.  Check with your insurance to see if they will cover the shingles shot.

## 2013-05-17 NOTE — Telephone Encounter (Signed)
Dr. Dayton Martes is referring the patient to gastro.  Pt states she would really like any appt she is given to be made for a Friday.  Please make note of this when scheduling.

## 2013-05-21 ENCOUNTER — Encounter: Payer: Self-pay | Admitting: Internal Medicine

## 2013-05-21 ENCOUNTER — Telehealth: Payer: Self-pay

## 2013-05-21 NOTE — Telephone Encounter (Signed)
error 

## 2013-05-21 NOTE — Telephone Encounter (Signed)
Patient informed of results.  

## 2013-05-21 NOTE — Telephone Encounter (Signed)
Message copied by Eulis Manly on Tue May 21, 2013  3:06 PM ------      Message from: Lorre Munroe      Created: Tue May 21, 2013  8:51 AM       Please call pt and let her know all of her labs are normal or stable. No treatment changes recommended at this time. ------

## 2013-06-03 ENCOUNTER — Other Ambulatory Visit: Payer: Self-pay | Admitting: *Deleted

## 2013-06-03 MED ORDER — ATENOLOL 50 MG PO TABS: ORAL_TABLET | ORAL | Status: DC

## 2013-06-03 MED ORDER — HYDROCHLOROTHIAZIDE 12.5 MG PO CAPS: 12.5000 mg | ORAL_CAPSULE | Freq: Every day | ORAL | Status: DC

## 2013-06-03 MED ORDER — SIMVASTATIN 80 MG PO TABS: ORAL_TABLET | ORAL | Status: DC

## 2013-07-19 ENCOUNTER — Ambulatory Visit (AMBULATORY_SURGERY_CENTER): Payer: Self-pay | Admitting: *Deleted

## 2013-07-19 VITALS — Ht 66.5 in | Wt 230.0 lb

## 2013-07-19 DIAGNOSIS — Z1211 Encounter for screening for malignant neoplasm of colon: Secondary | ICD-10-CM

## 2013-07-19 MED ORDER — MOVIPREP 100 G PO SOLR: ORAL | Status: DC

## 2013-07-19 NOTE — Progress Notes (Signed)
No allergies to eggs or soy. No problems with anesthesia.  

## 2013-07-26 ENCOUNTER — Other Ambulatory Visit: Payer: Self-pay

## 2013-07-26 MED ORDER — LEVOTHYROXINE SODIUM 112 MCG PO TABS: ORAL_TABLET | ORAL | Status: DC

## 2013-07-26 NOTE — Telephone Encounter (Signed)
Pt request refill for 90 day rx to [facility]. Advised pt done.

## 2013-08-02 ENCOUNTER — Ambulatory Visit (AMBULATORY_SURGERY_CENTER): Payer: PRIVATE HEALTH INSURANCE | Admitting: Internal Medicine

## 2013-08-02 ENCOUNTER — Encounter: Payer: Self-pay | Admitting: Internal Medicine

## 2013-08-02 ENCOUNTER — Other Ambulatory Visit: Payer: Self-pay | Admitting: Internal Medicine

## 2013-08-02 VITALS — BP 105/69 | HR 75 | Temp 95.9°F | Resp 16 | Ht 66.5 in | Wt 230.0 lb

## 2013-08-02 DIAGNOSIS — D126 Benign neoplasm of colon, unspecified: Secondary | ICD-10-CM

## 2013-08-02 DIAGNOSIS — Z1211 Encounter for screening for malignant neoplasm of colon: Secondary | ICD-10-CM

## 2013-08-02 HISTORY — PX: COLONOSCOPY: SHX174

## 2013-08-02 MED ORDER — SODIUM CHLORIDE 0.9 % IV SOLN: 500.0000 mL | INTRAVENOUS | Status: DC

## 2013-08-02 NOTE — Patient Instructions (Signed)
YOU HAD AN ENDOSCOPIC PROCEDURE TODAY AT THE [facility]: Refer to the procedure report that was given to you for any specific questions about what was found during the examination.  If the procedure report does not answer your questions, please call your gastroenterologist to clarify.  If you requested that your care partner not be given the details of your procedure findings, then the procedure report has been included in a sealed envelope for you to review at your convenience later.  YOU SHOULD EXPECT: Some feelings of bloating in the abdomen. Passage of more gas than usual.  Walking can help get rid of the air that was put into your GI tract during the procedure and reduce the bloating. If you had a lower endoscopy (such as a colonoscopy or flexible sigmoidoscopy) you may notice spotting of blood in your stool or on the toilet paper. If you underwent a bowel prep for your procedure, then you may not have a normal bowel movement for a few days.  DIET: Your first meal following the procedure should be a light meal and then it is ok to progress to your normal diet.  A half-sandwich or bowl of soup is an example of a good first meal.  Heavy or fried foods are harder to digest and may make you feel nauseous or bloated.  Likewise meals heavy in dairy and vegetables can cause extra gas to form and this can also increase the bloating.  Drink plenty of fluids but you should avoid alcoholic beverages for 24 hours.  ACTIVITY: Your care partner should take you home directly after the procedure.  You should plan to take it easy, moving slowly for the rest of the day.  You can resume normal activity the day after the procedure however you should NOT DRIVE or use heavy machinery for 24 hours (because of the sedation medicines used during the test).    SYMPTOMS TO REPORT IMMEDIATELY: A gastroenterologist can be reached at any hour.  During normal business hours, 8:30 AM to 5:00 PM Monday through Friday,  call [phone].  After hours and on weekends, please call the GI answering service at [phone] who will take a message and have the physician on call contact you.   Following lower endoscopy (colonoscopy or flexible sigmoidoscopy):  Excessive amounts of blood in the stool  Significant tenderness or worsening of abdominal pains  Swelling of the abdomen that is new, acute  Fever of 100F or higher    FOLLOW UP: If any biopsies were taken you will be contacted by phone or by letter within the next 1-3 weeks.  Call your gastroenterologist if you have not heard about the biopsies in 3 weeks.  Our staff will call the home number listed on your records the next business day following your procedure to check on you and address any questions or concerns that you may have at that time regarding the information given to you following your procedure. This is a courtesy call and so if there is no answer at the home number and we have not heard from you through the emergency physician on call, we will assume that you have returned to your regular daily activities without incident.  SIGNATURES/CONFIDENTIALITY: You and/or your care partner have signed paperwork which will be entered into your electronic medical record.  These signatures attest to the fact that that the information above on your After Visit Summary has been reviewed and is understood.  Full responsibility of the confidentiality   of this discharge information lies with you and/or your care-partner.     

## 2013-08-02 NOTE — Op Note (Signed)
[place]  [address]. [place], [address]   COLONOSCOPY PROCEDURE REPORT  PATIENT: Gloria, Murray  MR#: [id number] BIRTHDATE: 11-May-1951 , 45  yrs. old GENDER: Female ENDOSCOPIST: Eustace Quail, MD REFERRED DQ:QIWLN Aron, M.D. PROCEDURE DATE:  08/02/2013 PROCEDURE:   Colonoscopy with snare polypectomy x 2 First Screening Colonoscopy - Avg.  risk and is 50 yrs.  old or older Yes.  Prior Negative Screening - Now for repeat screening. N/A  History of Adenoma - Now for follow-up colonoscopy & has been > or = to 3 yrs.  N/A  Polyps Removed Today? Yes. ASA CLASS:   Class II INDICATIONS:average risk screening. MEDICATIONS: MAC sedation, administered by CRNA and propofol (Diprivan) 300mg  IV  DESCRIPTION OF PROCEDURE:   After the risks benefits and alternatives of the procedure were thoroughly explained, informed consent was obtained.  A digital rectal exam revealed hemorrhoids. The [id number]  endoscope was introduced through the anus and advanced to the cecum, which was identified by both the appendix and ileocecal valve. No adverse events experienced.   The quality of the prep was excellent, using MoviPrep  The instrument was then slowly withdrawn as the colon was fully examined.  COLON FINDINGS: A diminutive polyp was found in the ascending colon and removed with a cold snare.   A 23mm submucosal polyp was found in the rectum and removed using snare cautery.Each resection was complete and the polyp tissue was completely retrieved.   Moderate diverticulosis was noted  in the left colon.   The colon mucosa was otherwise normal.  Retroflexed views revealed internal hemorrhoids. The time to cecum=3 minutes 24 seconds.  Withdrawal time=11 minutes 32 seconds.  The scope was withdrawn and the procedure completed. COMPLICATIONS: There were no complications.  ENDOSCOPIC IMPRESSION: 1.   Diminutive polyp was found in the ascending colon; polypectomy was  performed with a cold snare 2.   Submucosal polyp 58mm in size was found in the rectum; polypectomy was performed using snare cautery 3.   Moderate diverticulosis was noted in the left colon 4.   The colon mucosa was otherwise normal  RECOMMENDATIONS: 1. Repeat colonoscopy in 5 years if polyp adenomatous; otherwise 10 years   eSigned:  Eustace Quail, MD 08/02/2013 9:41 AM   cc: Arnette Norris MD and The Patient

## 2013-08-02 NOTE — Progress Notes (Signed)
Report to pacu rn, vss, bbs=clear 

## 2013-08-02 NOTE — Progress Notes (Signed)
Called to room to assist during endoscopic procedure.  Patient ID and intended procedure confirmed with present staff. Received instructions for my participation in the procedure from the performing physician.  

## 2013-08-05 ENCOUNTER — Telehealth: Payer: Self-pay | Admitting: *Deleted

## 2013-08-05 NOTE — Telephone Encounter (Signed)
  Follow up Call-  Call back number 08/02/2013  Post procedure Call Back phone  # [phone] or [phone]  Permission to leave phone message Yes     Patient questions:  Do you have a fever, pain , or abdominal swelling? no Pain Score  0 *  Have you tolerated food without any problems? yes  Have you been able to return to your normal activities? yes  Do you have any questions about your discharge instructions: Diet   no Medications  no Follow up visit  no  Do you have questions or concerns about your Care? no  Actions: * If pain score is 4 or above: No action needed, pain <4.

## 2013-08-06 ENCOUNTER — Encounter: Payer: Self-pay | Admitting: Internal Medicine

## 2013-09-13 ENCOUNTER — Other Ambulatory Visit: Payer: Self-pay | Admitting: Vascular Surgery

## 2013-09-13 DIAGNOSIS — Z48812 Encounter for surgical aftercare following surgery on the circulatory system: Secondary | ICD-10-CM

## 2013-09-13 DIAGNOSIS — I6529 Occlusion and stenosis of unspecified carotid artery: Secondary | ICD-10-CM

## 2013-10-29 ENCOUNTER — Other Ambulatory Visit: Payer: Self-pay | Admitting: Family Medicine

## 2013-10-29 MED ORDER — HYDROCHLOROTHIAZIDE 12.5 MG PO CAPS: 12.5000 mg | ORAL_CAPSULE | Freq: Every day | ORAL | Status: DC

## 2013-10-29 MED ORDER — ATENOLOL 50 MG PO TABS: ORAL_TABLET | ORAL | Status: DC

## 2013-10-29 MED ORDER — SIMVASTATIN 80 MG PO TABS: ORAL_TABLET | ORAL | Status: DC

## 2014-01-21 ENCOUNTER — Other Ambulatory Visit (HOSPITAL_COMMUNITY): Payer: PRIVATE HEALTH INSURANCE

## 2014-01-21 ENCOUNTER — Ambulatory Visit: Payer: PRIVATE HEALTH INSURANCE | Admitting: Family

## 2014-01-23 ENCOUNTER — Encounter: Payer: Self-pay | Admitting: Family

## 2014-01-24 ENCOUNTER — Ambulatory Visit (HOSPITAL_COMMUNITY)
Admission: RE | Admit: 2014-01-24 | Discharge: 2014-01-24 | Disposition: A | Discharge status: Home / Self Care | Payer: No Typology Code available for payment source | Source: Ambulatory Visit | Attending: Vascular Surgery | Admitting: Vascular Surgery

## 2014-01-24 ENCOUNTER — Encounter (INDEPENDENT_AMBULATORY_CARE_PROVIDER_SITE_OTHER): Payer: Self-pay

## 2014-01-24 ENCOUNTER — Encounter: Payer: Self-pay | Admitting: Family

## 2014-01-24 ENCOUNTER — Ambulatory Visit (INDEPENDENT_AMBULATORY_CARE_PROVIDER_SITE_OTHER): Payer: PRIVATE HEALTH INSURANCE | Admitting: Family

## 2014-01-24 VITALS — BP 122/80 | HR 68 | Resp 16 | Ht 66.0 in | Wt 245.0 lb

## 2014-01-24 DIAGNOSIS — Z48812 Encounter for surgical aftercare following surgery on the circulatory system: Secondary | ICD-10-CM | POA: Diagnosis not present

## 2014-01-24 DIAGNOSIS — I6529 Occlusion and stenosis of unspecified carotid artery: Secondary | ICD-10-CM | POA: Diagnosis not present

## 2014-01-24 NOTE — Patient Instructions (Addendum)
Stroke Prevention Some medical conditions and behaviors are associated with an increased chance of having a stroke. You may prevent a stroke by making healthy choices and managing medical conditions. HOW CAN I REDUCE MY RISK OF HAVING A STROKE?   Stay physically active. Get at least 30 minutes of activity on most or all days.  Do not smoke. It may also be helpful to avoid exposure to secondhand smoke.  Limit alcohol use. Moderate alcohol use is considered to be:  No more than 2 drinks per day for men.  No more than 1 drink per day for nonpregnant women.  Eat healthy foods. This involves  Eating 5 or more servings of fruits and vegetables a day.  Following a diet that addresses high blood pressure (hypertension), high cholesterol, diabetes, or obesity.  Manage your cholesterol levels.  A diet low in saturated fat, trans fat, and cholesterol and high in fiber may control cholesterol levels.  Take any prescribed medicines to control cholesterol as directed by your health care provider.  Manage your diabetes.  A controlled-carbohydrate, controlled-sugar diet is recommended to manage diabetes.  Take any prescribed medicines to control diabetes as directed by your health care provider.  Control your hypertension.  A low-salt (sodium), low-saturated fat, low-trans fat, and low-cholesterol diet is recommended to manage hypertension.  Take any prescribed medicines to control hypertension as directed by your health care provider.  Maintain a healthy weight.  A reduced-calorie, low-sodium, low-saturated fat, low-trans fat, low-cholesterol diet is recommended to manage weight.  Stop drug abuse.  Avoid taking birth control pills.  Talk to your health care provider about the risks of taking birth control pills if you are over 35 years old, smoke, get migraines, or have ever had a blood clot.  Get evaluated for sleep disorders (sleep apnea).  Talk to your health care provider about  getting a sleep evaluation if you snore a lot or have excessive sleepiness.  Take medicines as directed by your health care provider.  For some people, aspirin or blood thinners (anticoagulants) are helpful in reducing the risk of forming abnormal blood clots that can lead to stroke. If you have the irregular heart rhythm of atrial fibrillation, you should be on a blood thinner unless there is a good reason you cannot take them.  Understand all your medicine instructions.  Make sure that other other conditions (such as anemia or atherosclerosis) are addressed. SEEK IMMEDIATE MEDICAL CARE IF:   You have sudden weakness or numbness of the face, arm, or leg, especially on one side of the body.  Your face or eyelid droops to one side.  You have sudden confusion.  You have trouble speaking (aphasia) or understanding.  You have sudden trouble seeing in one or both eyes.  You have sudden trouble walking.  You have dizziness.  You have a loss of balance or coordination.  You have a sudden, severe headache with no known cause.  You have new chest pain or an irregular heartbeat. Any of these symptoms may represent a serious problem that is an emergency. Do not wait to see if the symptoms will go away. Get medical help at once. Call your local emergency services  (911 in [place] Do not drive yourself to the hospital. Document Released: 08/25/2004 Document Revised: 05/08/2013 Document Reviewed: 01/18/2013 [employer] Patient Information 2015 [employer]. This information is not intended to replace advice given to you by your health care provider. Make sure you discuss any questions you have with your health   care provider.   Smoking Cessation Quitting smoking is important to your health and has many advantages. However, it is not always easy to quit since nicotine is a very addictive drug. Often times, people try 3 times or more before being able to quit. This document explains the best ways  for you to prepare to quit smoking. Quitting takes hard work and a lot of effort, but you can do it. ADVANTAGES OF QUITTING SMOKING  You will live longer, feel better, and live better.  Your body will feel the impact of quitting smoking almost immediately.  Within 20 minutes, blood pressure decreases. Your pulse returns to its normal level.  After 8 hours, carbon monoxide levels in the blood return to normal. Your oxygen level increases.  After 24 hours, the chance of having a heart attack starts to decrease. Your breath, hair, and body stop smelling like smoke.  After 48 hours, damaged nerve endings begin to recover. Your sense of taste and smell improve.  After 72 hours, the body is virtually free of nicotine. Your bronchial tubes relax and breathing becomes easier.  After 2 to 12 weeks, lungs can hold more air. Exercise becomes easier and circulation improves.  The risk of having a heart attack, stroke, cancer, or lung disease is greatly reduced.  After 1 year, the risk of coronary heart disease is cut in half.  After 5 years, the risk of stroke falls to the same as a nonsmoker.  After 10 years, the risk of lung cancer is cut in half and the risk of other cancers decreases significantly.  After 15 years, the risk of coronary heart disease drops, usually to the level of a nonsmoker.  If you are pregnant, quitting smoking will improve your chances of having a healthy baby.  The people you live with, especially any children, will be healthier.  You will have extra money to spend on things other than cigarettes. QUESTIONS TO THINK ABOUT BEFORE ATTEMPTING TO QUIT You may want to talk about your answers with your caregiver.  Why do you want to quit?  If you tried to quit in the past, what helped and what did not?  What will be the most difficult situations for you after you quit? How will you plan to handle them?  Who can help you through the tough times? Your family? Friends?  A caregiver?  What pleasures do you get from smoking? What ways can you still get pleasure if you quit? Here are some questions to ask your caregiver:  How can you help me to be successful at quitting?  What medicine do you think would be best for me and how should I take it?  What should I do if I need more help?  What is smoking withdrawal like? How can I get information on withdrawal? GET READY  Set a quit date.  Change your environment by getting rid of all cigarettes, ashtrays, matches, and lighters in your home, car, or work. Do not let people smoke in your home.  Review your past attempts to quit. Think about what worked and what did not. GET SUPPORT AND ENCOURAGEMENT You have a better chance of being successful if you have help. You can get support in many ways.  Tell your family, friends, and co-workers that you are going to quit and need their support. Ask them not to smoke around you.  Get individual, group, or telephone counseling and support. Programs are available at [employer] and health centers. Call  your local health department for information about programs in your area.  Spiritual beliefs and practices may help some smokers quit.  Download a "quit meter" on your computer to keep track of quit statistics, such as how long you have gone without smoking, cigarettes not smoked, and money saved.  Get a self-help book about quitting smoking and staying off of tobacco. [place] yourself from urges to smoke. Talk to someone, go for a walk, or occupy your time with a task.  Change your normal routine. Take a different route to work. Drink tea instead of coffee. Eat breakfast in a different place.  Reduce your stress. Take a hot bath, exercise, or read a book.  Plan something enjoyable to do every day. Reward yourself for not smoking.  Explore interactive web-based programs that specialize in helping you quit. GET MEDICINE AND  USE IT CORRECTLY Medicines can help you stop smoking and decrease the urge to smoke. Combining medicine with the above behavioral methods and support can greatly increase your chances of successfully quitting smoking.  Nicotine replacement therapy helps deliver nicotine to your body without the negative effects and risks of smoking. Nicotine replacement therapy includes nicotine gum, lozenges, inhalers, nasal sprays, and skin patches. Some may be available over-the-counter and others require a prescription.  Antidepressant medicine helps people abstain from smoking, but how this works is unknown. This medicine is available by prescription.  Nicotinic receptor partial agonist medicine simulates the effect of nicotine in your brain. This medicine is available by prescription. Ask your caregiver for advice about which medicines to use and how to use them based on your health history. Your caregiver will tell you what side effects to look out for if you choose to be on a medicine or therapy. Carefully read the information on the package. Do not use any other product containing nicotine while using a nicotine replacement product.  RELAPSE OR DIFFICULT SITUATIONS Most relapses occur within the first 3 months after quitting. Do not be discouraged if you start smoking again. Remember, most people try several times before finally quitting. You may have symptoms of withdrawal because your body is used to nicotine. You may crave cigarettes, be irritable, feel very hungry, cough often, get headaches, or have difficulty concentrating. The withdrawal symptoms are only temporary. They are strongest when you first quit, but they will go away within 10-14 days. To reduce the chances of relapse, try to:  Avoid drinking alcohol. Drinking lowers your chances of successfully quitting.  Reduce the amount of caffeine you consume. Once you quit smoking, the amount of caffeine in your body increases and can give you symptoms,  such as a rapid heartbeat, sweating, and anxiety.  Avoid smokers because they can make you want to smoke.  Do not let weight gain distract you. Many smokers will gain weight when they quit, usually less than 10 pounds. Eat a healthy diet and stay active. You can always lose the weight gained after you quit.  Find ways to improve your mood other than smoking. FOR MORE INFORMATION  www.smokefree.gov  Document Released: 07/12/2001 Document Revised: 01/17/2012 Document Reviewed: 10/27/2011 [facility] Patient Information 2015 [place], [place]. This information is not intended to replace advice given to you by your health care provider. Make sure you discuss any questions you have with your health care provider.

## 2014-01-24 NOTE — Progress Notes (Signed)
Established Carotid Patient   History of Present Illness  Gloria Murray is a 63 y.o. female patient of Dr. Donnetta Hutching who is status post left CEA in may of 2010. She returns today for follow up.  Patient has Negative history of TIA or stroke symptom.  The patient denies amaurosis fugax or monocular blindness.  The patient  denies facial drooping.  Pt. denies hemiplegia.  The patient denies receptive or expressive aphasia.  Pt. denies extremity weakness.   Pt reports New Medical or Surgical History: August 02, 2013 colonoscopy. She denies claudication symptoms with walking, denies non healing wounds, denies any cardiac problems.  Pt Diabetic: No Pt smoker: smoker  (10 cigarettes/day, started at age 62 yrs) She does lots of walking on her job.  Pt meds include: Statin : Yes ASA: Yes Other anticoagulants/antiplatelets: no   Past Medical History  Diagnosis Date  . Hypertension   . Hyperlipidemia   . Osteoporosis   . Thyroid disease   . Carotid artery occlusion   . Allergy     Social History History  Substance Use Topics  . Smoking status: Current Every Day Smoker -- 0.50 packs/day    Types: Cigarettes  . Smokeless tobacco: Never Used  . Alcohol Use: No    Family History Family History  Problem Relation Age of Onset  . Diabetes Mother   . Colon cancer Neg Hx     Surgical History Past Surgical History  Procedure Laterality Date  . Thyroidectomy    . Tubal ligation  1975    bilateral  . Dilation and curettage of uterus      X 2  . Carpal tunnel release      X 2, right wrist  . Carotid endarterectomy  12/11/2008    left  . Laceration hand Right     hand    Allergies  Allergen Reactions  . Propoxyphene Hcl     REACTION: nausea and itch  . Propoxyphene N-Acetaminophen     REACTION: nausea and  itch    Current Outpatient Prescriptions  Medication Sig Dispense Refill  . Ascorbic Acid GRAN Take 1 tablet by mouth daily.        Marland Kitchen aspirin 81 MG tablet Take 81 mg  by mouth daily.        Marland Kitchen atenolol (TENORMIN) 50 MG tablet Take one by mouth daily.  30 tablet  3  . Calcium Carbonate-Vitamin D (CALCIUM-VITAMIN D) 600-200 MG-UNIT CAPS Take 1 tablet by mouth 2 (two) times daily.        . Garlic 7616 MG CAPS Take 1 capsule by mouth 2 (two) times daily.        . hydrochlorothiazide (MICROZIDE) 12.5 MG capsule Take 1 capsule (12.5 mg total) by mouth daily.  30 capsule  3  . levothyroxine (SYNTHROID, LEVOTHROID) 112 MCG tablet Take one by mouth daily  90 tablet  3  . loratadine (CLARITIN) 10 MG tablet Take 10 mg by mouth as needed.        . Omega-3 Fatty Acids (FISH OIL) 1000 MG CAPS Take 1 capsule (1,000 mg total) by mouth daily.      . simvastatin (ZOCOR) 80 MG tablet Take one by mouth at bedtime.  30 tablet  3   No current facility-administered medications for this visit.    Review of Systems : See HPI for pertinent positives and negatives.  Physical Examination  Filed Vitals:   01/24/14 1434 01/24/14 1444  BP: 95/70 122/80  Pulse: 68 68  Resp:  16  Height:  5\' 6"  (1.676 m)  Weight:  245 lb (111.131 kg)  SpO2:  95%   Body mass index is 39.56 kg/(m^2).  General: WDWN obese female in NAD GAIT: normal Eyes: PERRLA Pulmonary:  Non-labored, CTAB with diminished air movement in all fields, Negative  Rales, Negative rhonchi, & Negative wheezing.  Cardiac: regular Rhythm ,  Negative detected murmur.  VASCULAR EXAM Carotid Bruits Left Right   Positive Positive    Aorta is not palpable. Radial pulses are 2+ palpable and equal.                                                                                                                       Gastrointestinal: soft, nontender, BS WNL, no r/g,  negative masses.  Musculoskeletal: Negative muscle atrophy/wasting. M/S 5/5 throughout, Extremities without ischemic changes.  Neurologic: A&O X 3; Appropriate Affect ; SENSATION ;normal;  Speech is normal CN 2-12 intact, Pain and light touch intact in  extremities, Motor exam as listed above.   Non-Invasive Vascular Imaging CAROTID DUPLEX 01/24/2014   CEREBROVASCULAR DUPLEX EVALUATION    INDICATION: Carotid artery disease     PREVIOUS INTERVENTION(S): Left carotid endarterectomy 12/11/2008.    DUPLEX EXAM:     RIGHT  LEFT  Peak Systolic Velocities (cm/s) End Diastolic Velocities (cm/s) Plaque LOCATION Peak Systolic Velocities (cm/s) End Diastolic Velocities (cm/s) Plaque  94 24  CCA PROXIMAL 65 24   73 23  CCA MID 59 24   71 20  CCA DISTAL 264 79 HT/HM  185 21 HT ECA 94 26   183 58 CP ICA PROXIMAL 73 22   192 67 CP ICA MID 59 29   51 15  ICA DISTAL 70 35     2.63 ICA / CCA Ratio (PSV)   Retrograde  Vertebral Flow Antegrade   921 Brachial Systolic Pressure (mmHg) 194  Monophasic Brachial Artery Waveforms Biphasic     Plaque Morphology:  HM = Homogeneous, HT = Heterogeneous, CP = Calcific Plaque, SP = Smooth Plaque, IP = Irregular Plaque     ADDITIONAL FINDINGS: Known subclavian artery disease bilaterally.    IMPRESSION: Right internal carotid artery velocities suggest a 40-59% stenosis (high end of range). Patent left carotid endarterectomy site with evidence of hyperplasia at the proximal patch site; Velocities are suggestive of a 60-79% stenosis.      Compared to the previous exam:  Right internal carotid artery remains stable; the left endarterectomy site shows significant disease progression since the last exam on 01/04/2013.    Assessment: Gloria Murray is a 63 y.o. female who is status post left CEA in may of 2010. She presents with asymptomatic 40-59% right ICA stenosis (high end of range) and patent left carotid endarterectomy site with evidence of hyperplasia at the proximal patch site; Velocities are suggestive of a 60-79% stenosis. Right internal carotid artery remains stable; the left endarterectomy site shows significant disease progression since the last exam on 01/04/2013. The atherosclerotic  progression at the  endarterectomy site is likely due to smoking; this was discussed with the patient.   Plan: Follow-up in 6 months with Carotid Duplex scan and see Dr. Donnetta Hutching.  She was counseled re smoking cessation.  I discussed in depth with the patient the nature of atherosclerosis, and emphasized the importance of maximal medical management including strict control of blood pressure, blood glucose, and lipid levels, obtaining regular exercise, and cessation of smoking.  The patient is aware that without maximal medical management the underlying atherosclerotic disease process will progress, limiting the benefit of any interventions. The patient was given information about stroke prevention and what symptoms should prompt the patient to seek immediate medical care. Thank you for allowing [place] to participate in this patient's care.  Clemon Chambers, RN, MSN, FNP-C [facility] Office: [phone]  Clinic Physician: Bridgett Larsson  01/24/2014 2:08 PM

## 2014-03-18 ENCOUNTER — Other Ambulatory Visit: Payer: Self-pay

## 2014-03-18 MED ORDER — HYDROCHLOROTHIAZIDE 12.5 MG PO CAPS: 12.5000 mg | ORAL_CAPSULE | Freq: Every day | ORAL | Status: DC

## 2014-03-18 MED ORDER — SIMVASTATIN 80 MG PO TABS: ORAL_TABLET | ORAL | Status: DC

## 2014-03-18 MED ORDER — ATENOLOL 50 MG PO TABS: ORAL_TABLET | ORAL | Status: DC

## 2014-03-18 NOTE — Telephone Encounter (Signed)
Pt left v/m requesting refill atenolol,simvastatin and HCTZ to [facility] [place] [facility]. Left v/m for pt to ck with pharmacy

## 2014-05-02 ENCOUNTER — Other Ambulatory Visit: Payer: Self-pay | Admitting: Family Medicine

## 2014-05-02 DIAGNOSIS — E039 Hypothyroidism, unspecified: Secondary | ICD-10-CM

## 2014-05-02 DIAGNOSIS — I10 Essential (primary) hypertension: Secondary | ICD-10-CM

## 2014-05-02 DIAGNOSIS — R945 Abnormal results of liver function studies: Secondary | ICD-10-CM

## 2014-05-02 DIAGNOSIS — E782 Mixed hyperlipidemia: Secondary | ICD-10-CM

## 2014-05-02 DIAGNOSIS — Z0000 Encounter for general adult medical examination without abnormal findings: Secondary | ICD-10-CM

## 2014-05-05 ENCOUNTER — Telehealth: Payer: Self-pay | Admitting: Family Medicine

## 2014-05-05 NOTE — Telephone Encounter (Signed)
emmi mailed  °

## 2014-05-09 ENCOUNTER — Other Ambulatory Visit (INDEPENDENT_AMBULATORY_CARE_PROVIDER_SITE_OTHER): Payer: Commercial Managed Care - HMO

## 2014-05-09 DIAGNOSIS — E039 Hypothyroidism, unspecified: Secondary | ICD-10-CM

## 2014-05-09 DIAGNOSIS — E782 Mixed hyperlipidemia: Secondary | ICD-10-CM

## 2014-05-09 DIAGNOSIS — R945 Abnormal results of liver function studies: Secondary | ICD-10-CM

## 2014-05-09 DIAGNOSIS — I10 Essential (primary) hypertension: Secondary | ICD-10-CM

## 2014-05-09 DIAGNOSIS — Z0000 Encounter for general adult medical examination without abnormal findings: Secondary | ICD-10-CM

## 2014-05-09 LAB — CBC WITH DIFFERENTIAL/PLATELET
Basophils Absolute: 0 10*3/uL (ref 0.0–0.1)
Basophils Relative: 0.4 % (ref 0.0–3.0)
EOS PCT: 2.6 % (ref 0.0–5.0)
Eosinophils Absolute: 0.1 10*3/uL (ref 0.0–0.7)
HCT: 45 % (ref 36.0–46.0)
Hemoglobin: 14.9 g/dL (ref 12.0–15.0)
LYMPHS PCT: 43.8 % (ref 12.0–46.0)
Lymphs Abs: 2.5 10*3/uL (ref 0.7–4.0)
MCHC: 33.2 g/dL (ref 30.0–36.0)
MCV: 91.6 fl (ref 78.0–100.0)
MONO ABS: 0.5 10*3/uL (ref 0.1–1.0)
Monocytes Relative: 8.7 % (ref 3.0–12.0)
NEUTROS PCT: 44.5 % (ref 43.0–77.0)
Neutro Abs: 2.5 10*3/uL (ref 1.4–7.7)
PLATELETS: 275 10*3/uL (ref 150.0–400.0)
RBC: 4.91 Mil/uL (ref 3.87–5.11)
RDW: 12.8 % (ref 11.5–15.5)
WBC: 5.7 10*3/uL (ref 4.0–10.5)

## 2014-05-09 LAB — COMPREHENSIVE METABOLIC PANEL
ALK PHOS: 67 U/L (ref 39–117)
ALT: 32 U/L (ref 0–35)
AST: 25 U/L (ref 0–37)
Albumin: 3.8 g/dL (ref 3.5–5.2)
BUN: 17 mg/dL (ref 6–23)
CO2: 27 mEq/L (ref 19–32)
Calcium: 9.6 mg/dL (ref 8.4–10.5)
Chloride: 104 mEq/L (ref 96–112)
Creatinine, Ser: 0.9 mg/dL (ref 0.4–1.2)
GFR: 68.54 mL/min (ref >60.00)
GLUCOSE: 138 mg/dL — AB (ref 70–99)
Potassium: 3.8 mEq/L (ref 3.5–5.1)
SODIUM: 140 meq/L (ref 135–145)
TOTAL PROTEIN: 7.5 g/dL (ref 6.0–8.3)
Total Bilirubin: 0.3 mg/dL (ref 0.2–1.2)

## 2014-05-09 LAB — LIPID PANEL
Cholesterol: 131 mg/dL (ref 0–200)
HDL: 27.4 mg/dL — AB (ref >39.00)
LDL Cholesterol: 69 mg/dL (ref 0–99)
NONHDL: 103.6
TRIGLYCERIDES: 171 mg/dL — AB (ref 0.0–149.0)
Total CHOL/HDL Ratio: 5
VLDL: 34.2 mg/dL (ref 0.0–40.0)

## 2014-05-09 LAB — TSH: TSH: 0.63 u[IU]/mL (ref 0.35–4.50)

## 2014-05-12 ENCOUNTER — Other Ambulatory Visit: Payer: Self-pay | Admitting: Family Medicine

## 2014-05-12 DIAGNOSIS — Z1231 Encounter for screening mammogram for malignant neoplasm of breast: Secondary | ICD-10-CM

## 2014-05-16 ENCOUNTER — Other Ambulatory Visit: Payer: PRIVATE HEALTH INSURANCE

## 2014-05-19 ENCOUNTER — Encounter: Payer: Self-pay | Admitting: Family Medicine

## 2014-05-19 ENCOUNTER — Ambulatory Visit (INDEPENDENT_AMBULATORY_CARE_PROVIDER_SITE_OTHER): Payer: Commercial Managed Care - HMO | Admitting: Family Medicine

## 2014-05-19 ENCOUNTER — Encounter: Payer: PRIVATE HEALTH INSURANCE | Admitting: Family Medicine

## 2014-05-19 VITALS — BP 136/70 | HR 83 | Temp 97.9°F | Ht 66.25 in | Wt 225.2 lb

## 2014-05-19 DIAGNOSIS — Z0000 Encounter for general adult medical examination without abnormal findings: Secondary | ICD-10-CM

## 2014-05-19 DIAGNOSIS — I10 Essential (primary) hypertension: Secondary | ICD-10-CM

## 2014-05-19 DIAGNOSIS — E039 Hypothyroidism, unspecified: Secondary | ICD-10-CM

## 2014-05-19 DIAGNOSIS — H9192 Unspecified hearing loss, left ear: Secondary | ICD-10-CM

## 2014-05-19 DIAGNOSIS — E739 Lactose intolerance, unspecified: Secondary | ICD-10-CM

## 2014-05-19 DIAGNOSIS — Z23 Encounter for immunization: Secondary | ICD-10-CM

## 2014-05-19 DIAGNOSIS — E782 Mixed hyperlipidemia: Secondary | ICD-10-CM

## 2014-05-19 DIAGNOSIS — Z720 Tobacco use: Secondary | ICD-10-CM

## 2014-05-19 DIAGNOSIS — F17200 Nicotine dependence, unspecified, uncomplicated: Secondary | ICD-10-CM

## 2014-05-19 LAB — HEMOGLOBIN A1C: HEMOGLOBIN A1C: 6.1 % (ref 4.6–6.5)

## 2014-05-19 NOTE — Assessment & Plan Note (Signed)
Well controlled on current rx. No changes made. 

## 2014-05-19 NOTE — Assessment & Plan Note (Signed)
She is working on quitting. Discussed low dose radiation CT for lung cancer screening.  She wants to think about it.

## 2014-05-19 NOTE — Assessment & Plan Note (Signed)
Well controlled. No changes made today. Lab Results  Component Value Date   CREATININE 0.9 05/09/2014

## 2014-05-19 NOTE — Progress Notes (Signed)
63 yo pleasant female with h/o HLD, carotid stenosis, HTN, tobacco abuse, here for welcome to medicare visit.  Colonoscopy 1/2/15Henrene Pastor- recall in 5 years. Mammogram 05/17/13. Has mammogram scheduled for 05/23/14. Td 03/07/07 Has never had pneumovax or zostavax. Pap smear 05/17/13 (done by me).   No h/o abnormal pap smears. LMP 18 years ago.  No h/o post menopausal bleeding.    HLD- on Zocor 80 mg daily.  s/p left CEA and stent placement 07/2009 (Dr. Donnetta Hutching).  Has an appointment with him this December.  Lab Results  Component Value Date   CHOL 131 05/09/2014   HDL 27.40* 05/09/2014   LDLCALC 69 05/09/2014   LDLDIRECT 95.9 05/17/2010   TRIG 171.0* 05/09/2014   CHOLHDL 5 05/09/2014     She is still smoking but cut back to 6 cigs/day. Not ready to quit.  HTN- tolerating meds without side effects.  no HA or blurred vision.   Lab Results  Component Value Date   CREATININE 0.9 05/09/2014      Hypothyroidism-  Levothyroid 112 micrograms daily.  Denies any symptoms of hypo or hyperthyroidism. Lab Results  Component Value Date   TSH 0.63 05/09/2014   Hyperglycemia- glucose elevated again today.  Per pt, was fasting.  Patient Active Problem List  Diagnosis  . HYPOTHYROIDISM  . GLUCOSE INTOLERANCE  . HYPERLIPIDEMIA, MIXED, MILD  . OBESITY  . NICOTINE ADDICTION  . LOSS, HEARING NOS  . HYPERTENSION, BENIGN ESSENTIAL  . OSTEOPENIA  . EDEMA LEG  . CAROTID BRUITS, BILATERAL  . MAMMOGRAM, ABNORMAL, LEFT  . LIVER FUNCTION TESTS, ABNORMAL  . Cerumen impaction   Past Medical History  Diagnosis Date  . Hypertension   . Hyperlipidemia   . Osteoporosis   . Thyroid disease    Past Surgical History  Procedure Date  . Thyroidectomy   . Tubal ligation 1975    bilateral  . Dilation and curettage of uterus     X 2  . Carpal tunnel release     X 2, right wrist  . Carotid endarterectomy 12/11/2008    left   History  Substance Use Topics  . Smoking status: Current Every Day  Smoker  . Smokeless tobacco: Not on file  . Alcohol Use: Not on file   Family History  Problem Relation Age of Onset  . Diabetes Mother    Allergies  Allergen Reactions  . Propoxyphene Hcl     REACTION: nausea and itch  . Propoxyphene-Acetaminophen     REACTION: nausea and  itch   Current Outpatient Prescriptions on File Prior to Visit  Medication Sig Dispense Refill  . Ascorbic Acid GRAN Take 1 tablet by mouth daily.        Marland Kitchen aspirin 81 MG tablet Take 81 mg by mouth daily.        Marland Kitchen atenolol (TENORMIN) 50 MG tablet Take 1 tablet (50 mg total) by mouth daily.  30 tablet  0  . Calcium Carbonate-Vitamin D (CALCIUM-VITAMIN D) 600-200 MG-UNIT CAPS Take 1 tablet by mouth 2 (two) times daily.        . Garlic 5784 MG CAPS Take 1 capsule by mouth 2 (two) times daily.        . hydrochlorothiazide (MICROZIDE) 12.5 MG capsule Take 1 capsule (12.5 mg total) by mouth daily.  30 capsule  0  . levothyroxine (SYNTHROID, LEVOTHROID) 112 MCG tablet Take 1 tablet (112 mcg total) by mouth daily.  90 tablet  0  . loratadine (CLARITIN) 10 MG tablet  Take 10 mg by mouth as needed.        . Omega-3 Fatty Acids (FISH OIL) 1000 MG CAPS Take 1 capsule (1,000 mg total) by mouth daily.      . simvastatin (ZOCOR) 80 MG tablet Take 1 tablet (80 mg total) by mouth at bedtime.  30 tablet  0  . simvastatin (ZOCOR) 80 MG tablet Take 1 tablet (80 mg total) by mouth at bedtime.  30 tablet  0   The PMH, PSH, Social History, Family History, Medications, and allergies have been reviewed in [facility], and have been updated if relevant.  ROS: See HPI No CP or SOB Denies feeling depressed anxiou Physical exam: BP 136/70  Pulse 83  Temp(Src) 97.9 F (36.6 C) (Oral)  Ht 5' 6.25" (1.683 m)  Wt 225 lb 4 oz (102.173 kg)  BMI 36.07 kg/m2  SpO2 97%   General:  Well-developed,well-nourished,in no acute distress; alert,appropriate and cooperative throughout examination Head:  normocephalic and atraumatic.   Well healed vertical  surgical scar on neck, no bruits bilaterally Eyes:  vision grossly intact, pupils equal, pupils round, and pupils reactive to light.   Ears:  R ear normal and L ear normal.   Nose:  no external deformity.   Mouth:  good dentition.   Neck:  No deformities, masses, or tenderness noted. +left carotid bruit Breasts:  No mass, nodules, thickening, tenderness, bulging, retraction, inflamation, nipple discharge or skin changes noted.   Lungs:  Normal respiratory effort, chest expands symmetrically. Lungs are clear to auscultation, no crackles or wheezes. Heart:  Normal rate and regular rhythm. S1 and S2 normal without gallop, murmur, click, rub or other extra sounds. Abdomen:  Bowel sounds positive,abdomen soft and non-tender without masses, organomegaly or hernias noted. Msk:  No deformity or scoliosis noted of thoracic or lumbar spine.   Extremities:  No clubbing, cyanosis, edema, or deformity noted with normal full range of motion of all joints.   Neurologic:  alert & oriented X3 and gait normal.   Skin:  Intact without suspicious lesions or rashes Cervical Nodes:  No lymphadenopathy noted Axillary Nodes:  No palpable lymphadenopathy Psych:  Cognition and judgment appear intact. Alert and cooperative with normal attention span and concentration. No apparent delusions, illusions, hallucinations

## 2014-05-19 NOTE — Assessment & Plan Note (Signed)
Discussed audiology referral.  She wants to think about it.

## 2014-05-19 NOTE — Patient Instructions (Addendum)
Check with your insurance to see if they will cover the shingles shot.  Use dilute hydrogen peroxide (1:1 with water) a few drops in right ear to help break down ear wax.  It was great to see you.  Please think about getting a CT scan to look for lung cancer.  I know you can quit smoking.

## 2014-05-19 NOTE — Progress Notes (Signed)
Pre visit review using our clinic review tool, if applicable. No additional management support is needed unless otherwise documented below in the visit note. 

## 2014-05-19 NOTE — Assessment & Plan Note (Signed)
Will check a1c otday.

## 2014-05-19 NOTE — Assessment & Plan Note (Signed)
The patients weight, height, BMI and visual acuity have been recorded in the chart I have made referrals, counseling and provided education to the patient based review of the above and I have provided the pt with a written personalized care plan for preventive services.  EKG reassuring- NSR  Pneumovax and influenza vaccinations given today. She will call insurance company regarding zostavax coverage.

## 2014-05-20 ENCOUNTER — Encounter: Payer: Self-pay | Admitting: *Deleted

## 2014-05-22 ENCOUNTER — Other Ambulatory Visit: Payer: Self-pay

## 2014-05-22 MED ORDER — SIMVASTATIN 80 MG PO TABS: ORAL_TABLET | ORAL | Status: DC

## 2014-05-22 MED ORDER — ATENOLOL 50 MG PO TABS: ORAL_TABLET | ORAL | Status: DC

## 2014-05-22 MED ORDER — HYDROCHLOROTHIAZIDE 12.5 MG PO CAPS: 12.5000 mg | ORAL_CAPSULE | Freq: Every day | ORAL | Status: DC

## 2014-05-22 MED ORDER — LEVOTHYROXINE SODIUM 112 MCG PO TABS: ORAL_TABLET | ORAL | Status: DC

## 2014-05-22 NOTE — Telephone Encounter (Signed)
Pt left note requesting refill atenolol, HCTZ, levothyroxine and simvastatin to [facility] order. Pt said account has been set up with mail order pharmacy. Advised pt refills done.

## 2014-05-23 ENCOUNTER — Ambulatory Visit (HOSPITAL_COMMUNITY)
Admission: RE | Admit: 2014-05-23 | Discharge: 2014-05-23 | Disposition: A | Discharge status: Home / Self Care | Payer: Medicare HMO | Source: Ambulatory Visit | Attending: Family Medicine | Admitting: Family Medicine

## 2014-05-23 ENCOUNTER — Encounter: Payer: Commercial Managed Care - HMO | Admitting: Family Medicine

## 2014-05-23 ENCOUNTER — Ambulatory Visit (HOSPITAL_COMMUNITY): Payer: Medicare Other

## 2014-05-23 DIAGNOSIS — Z1231 Encounter for screening mammogram for malignant neoplasm of breast: Secondary | ICD-10-CM | POA: Diagnosis present

## 2014-06-02 ENCOUNTER — Telehealth: Payer: Self-pay | Admitting: *Deleted

## 2014-06-02 MED ORDER — SIMVASTATIN 40 MG PO TABS: ORAL_TABLET | ORAL | Status: DC

## 2014-06-02 NOTE — Telephone Encounter (Signed)
Spoke to pt and advised per Dr Deborra Medina. Pt states that she still has 1wk of 80mg  left. Is it ok for her to continue 80mg  until they are gone, and then start 40mg ? Pt states that she will need new Rx for 40mg 

## 2014-06-02 NOTE — Telephone Encounter (Signed)
Faxed request stating that rx for Simvastatin 80mg /day and less than 12 months hx of dose. Max dose is 40mg /day for most patients to reduce risk of myopathy, are you aware of risk and should we continue to fill at simvastatin 80mg ? Per [place] per FDA guidelines.  (form on your desk)

## 2014-06-02 NOTE — Telephone Encounter (Signed)
Yes.  eRx sent. 

## 2014-06-02 NOTE — Telephone Encounter (Signed)
She has been on this dose longer than 12 months- see Epic. Started due to her carotid stenosis- followed by Vascular surgery, Dr. Donnetta Hutching.  I would like for her to decrease dose but she would need to address this with Dr. Donnetta Hutching as well.

## 2014-06-03 NOTE — Telephone Encounter (Signed)
Spoke to pt and advised per Dr Deborra Medina; pt informed new Rx sent to requested pharmacy

## 2014-07-16 ENCOUNTER — Encounter: Payer: Self-pay | Admitting: Family

## 2014-07-17 ENCOUNTER — Ambulatory Visit (INDEPENDENT_AMBULATORY_CARE_PROVIDER_SITE_OTHER): Payer: Commercial Managed Care - HMO | Admitting: Family

## 2014-07-17 ENCOUNTER — Encounter: Payer: Self-pay | Admitting: Family

## 2014-07-17 ENCOUNTER — Other Ambulatory Visit: Payer: Self-pay

## 2014-07-17 ENCOUNTER — Ambulatory Visit (HOSPITAL_COMMUNITY)
Admission: RE | Admit: 2014-07-17 | Discharge: 2014-07-17 | Disposition: A | Discharge status: Home / Self Care | Payer: Commercial Managed Care - HMO | Source: Ambulatory Visit | Attending: Family | Admitting: Family

## 2014-07-17 VITALS — BP 128/78 | HR 70 | Resp 18 | Ht 66.5 in | Wt 223.0 lb

## 2014-07-17 DIAGNOSIS — G458 Other transient cerebral ischemic attacks and related syndromes: Secondary | ICD-10-CM | POA: Insufficient documentation

## 2014-07-17 DIAGNOSIS — I6523 Occlusion and stenosis of bilateral carotid arteries: Secondary | ICD-10-CM | POA: Insufficient documentation

## 2014-07-17 DIAGNOSIS — Z48812 Encounter for surgical aftercare following surgery on the circulatory system: Secondary | ICD-10-CM

## 2014-07-17 DIAGNOSIS — Z9889 Other specified postprocedural states: Secondary | ICD-10-CM

## 2014-07-17 NOTE — Patient Instructions (Signed)
Stroke Prevention Some medical conditions and behaviors are associated with an increased chance of having a stroke. You may prevent a stroke by making healthy choices and managing medical conditions. HOW CAN I REDUCE MY RISK OF HAVING A STROKE?   Stay physically active. Get at least 30 minutes of activity on most or all days.  Do not smoke. It may also be helpful to avoid exposure to secondhand smoke.  Limit alcohol use. Moderate alcohol use is considered to be:  No more than 2 drinks per day for men.  No more than 1 drink per day for nonpregnant women.  Eat healthy foods. This involves:  Eating 5 or more servings of fruits and vegetables a day.  Making dietary changes that address high blood pressure (hypertension), high cholesterol, diabetes, or obesity.  Manage your cholesterol levels.  Making food choices that are high in fiber and low in saturated fat, trans fat, and cholesterol may control cholesterol levels.  Take any prescribed medicines to control cholesterol as directed by your health care provider.  Manage your diabetes.  Controlling your carbohydrate and sugar intake is recommended to manage diabetes.  Take any prescribed medicines to control diabetes as directed by your health care provider.  Control your hypertension.  Making food choices that are low in salt (sodium), saturated fat, trans fat, and cholesterol is recommended to manage hypertension.  Take any prescribed medicines to control hypertension as directed by your health care provider.  Maintain a healthy weight.  Reducing calorie intake and making food choices that are low in sodium, saturated fat, trans fat, and cholesterol are recommended to manage weight.  Stop drug abuse.  Avoid taking birth control pills.  Talk to your health care provider about the risks of taking birth control pills if you are over 35 years old, smoke, get migraines, or have ever had a blood clot.  Get evaluated for sleep  disorders (sleep apnea).  Talk to your health care provider about getting a sleep evaluation if you snore a lot or have excessive sleepiness.  Take medicines only as directed by your health care provider.  For some people, aspirin or blood thinners (anticoagulants) are helpful in reducing the risk of forming abnormal blood clots that can lead to stroke. If you have the irregular heart rhythm of atrial fibrillation, you should be on a blood thinner unless there is a good reason you cannot take them.  Understand all your medicine instructions.  Make sure that other conditions (such as anemia or atherosclerosis) are addressed. SEEK IMMEDIATE MEDICAL CARE IF:   You have sudden weakness or numbness of the face, arm, or leg, especially on one side of the body.  Your face or eyelid droops to one side.  You have sudden confusion.  You have trouble speaking (aphasia) or understanding.  You have sudden trouble seeing in one or both eyes.  You have sudden trouble walking.  You have dizziness.  You have a loss of balance or coordination.  You have a sudden, severe headache with no known cause.  You have new chest pain or an irregular heartbeat. Any of these symptoms may represent a serious problem that is an emergency. Do not wait to see if the symptoms will go away. Get medical help at once. Call your local emergency services (911 in [place] Do not drive yourself to the hospital. Document Released: 08/25/2004 Document Revised: 12/02/2013 Document Reviewed: 01/18/2013 ExitCare Patient Information 2015 [employer]. This information is not intended to replace advice given   to you by your health care provider. Make sure you discuss any questions you have with your health care provider.    Smoking Cessation Quitting smoking is important to your health and has many advantages. However, it is not always easy to quit since nicotine is a very addictive drug. Oftentimes, people try 3 times or  more before being able to quit. This document explains the best ways for you to prepare to quit smoking. Quitting takes hard work and a lot of effort, but you can do it. ADVANTAGES OF QUITTING SMOKING  You will live longer, feel better, and live better.  Your body will feel the impact of quitting smoking almost immediately.  Within 20 minutes, blood pressure decreases. Your pulse returns to its normal level.  After 8 hours, carbon monoxide levels in the blood return to normal. Your oxygen level increases.  After 24 hours, the chance of having a heart attack starts to decrease. Your breath, hair, and body stop smelling like smoke.  After 48 hours, damaged nerve endings begin to recover. Your sense of taste and smell improve.  After 72 hours, the body is virtually free of nicotine. Your bronchial tubes relax and breathing becomes easier.  After 2 to 12 weeks, lungs can hold more air. Exercise becomes easier and circulation improves.  The risk of having a heart attack, stroke, cancer, or lung disease is greatly reduced.  After 1 year, the risk of coronary heart disease is cut in half.  After 5 years, the risk of stroke falls to the same as a nonsmoker.  After 10 years, the risk of lung cancer is cut in half and the risk of other cancers decreases significantly.  After 15 years, the risk of coronary heart disease drops, usually to the level of a nonsmoker.  If you are pregnant, quitting smoking will improve your chances of having a healthy baby.  The people you live with, especially any children, will be healthier.  You will have extra money to spend on things other than cigarettes. QUESTIONS TO THINK ABOUT BEFORE ATTEMPTING TO QUIT You may want to talk about your answers with your health care provider.  Why do you want to quit?  If you tried to quit in the past, what helped and what did not?  What will be the most difficult situations for you after you quit? How will you plan to  handle them?  Who can help you through the tough times? Your family? Friends? A health care provider?  What pleasures do you get from smoking? What ways can you still get pleasure if you quit? Here are some questions to ask your health care provider:  How can you help me to be successful at quitting?  What medicine do you think would be best for me and how should I take it?  What should I do if I need more help?  What is smoking withdrawal like? How can I get information on withdrawal? GET READY  Set a quit date.  Change your environment by getting rid of all cigarettes, ashtrays, matches, and lighters in your home, car, or work. Do not let people smoke in your home.  Review your past attempts to quit. Think about what worked and what did not. GET SUPPORT AND ENCOURAGEMENT You have a better chance of being successful if you have help. You can get support in many ways.  Tell your family, friends, and coworkers that you are going to quit and need their support. Ask them   not to smoke around you.  Get individual, group, or telephone counseling and support. Programs are available at local hospitals and health centers. Call your local health department for information about programs in your area.  Spiritual beliefs and practices may help some smokers quit.  Download a "quit meter" on your computer to keep track of quit statistics, such as how long you have gone without smoking, cigarettes not smoked, and money saved.  Get a self-help book about quitting smoking and staying off tobacco. LEARN NEW SKILLS AND BEHAVIORS  Distract yourself from urges to smoke. Talk to someone, go for a walk, or occupy your time with a task.  Change your normal routine. Take a different route to work. Drink tea instead of coffee. Eat breakfast in a different place.  Reduce your stress. Take a hot bath, exercise, or read a book.  Plan something enjoyable to do every day. Reward yourself for not  smoking.  Explore interactive web-based programs that specialize in helping you quit. GET MEDICINE AND USE IT CORRECTLY Medicines can help you stop smoking and decrease the urge to smoke. Combining medicine with the above behavioral methods and support can greatly increase your chances of successfully quitting smoking.  Nicotine replacement therapy helps deliver nicotine to your body without the negative effects and risks of smoking. Nicotine replacement therapy includes nicotine gum, lozenges, inhalers, nasal sprays, and skin patches. Some may be available over-the-counter and others require a prescription.  Antidepressant medicine helps people abstain from smoking, but how this works is unknown. This medicine is available by prescription.  Nicotinic receptor partial agonist medicine simulates the effect of nicotine in your brain. This medicine is available by prescription. Ask your health care provider for advice about which medicines to use and how to use them based on your health history. Your health care provider will tell you what side effects to look out for if you choose to be on a medicine or therapy. Carefully read the information on the package. Do not use any other product containing nicotine while using a nicotine replacement product.  RELAPSE OR DIFFICULT SITUATIONS Most relapses occur within the first 3 months after quitting. Do not be discouraged if you start smoking again. Remember, most people try several times before finally quitting. You may have symptoms of withdrawal because your body is used to nicotine. You may crave cigarettes, be irritable, feel very hungry, cough often, get headaches, or have difficulty concentrating. The withdrawal symptoms are only temporary. They are strongest when you first quit, but they will go away within 10-14 days. To reduce the chances of relapse, try to:  Avoid drinking alcohol. Drinking lowers your chances of successfully quitting.  Reduce the  amount of caffeine you consume. Once you quit smoking, the amount of caffeine in your body increases and can give you symptoms, such as a rapid heartbeat, sweating, and anxiety.  Avoid smokers because they can make you want to smoke.  Do not let weight gain distract you. Many smokers will gain weight when they quit, usually less than 10 pounds. Eat a healthy diet and stay active. You can always lose the weight gained after you quit.  Find ways to improve your mood other than smoking. FOR MORE INFORMATION  www.smokefree.gov  Document Released: 07/12/2001 Document Revised: 12/02/2013 Document Reviewed: 10/27/2011 ExitCare Patient Information 2015 [employer]. This information is not intended to replace advice given to you by your health care provider. Make sure you discuss any questions you have with your health   care provider.    Smoking Cessation, Tips for Success If you are ready to quit smoking, congratulations! You have chosen to help yourself be healthier. Cigarettes bring nicotine, tar, carbon monoxide, and other irritants into your body. Your lungs, heart, and blood vessels will be able to work better without these poisons. There are many different ways to quit smoking. Nicotine gum, nicotine patches, a nicotine inhaler, or nicotine nasal spray can help with physical craving. Hypnosis, support groups, and medicines help break the habit of smoking. WHAT THINGS CAN I DO TO MAKE QUITTING EASIER?  Here are some tips to help you quit for good:  Pick a date when you will quit smoking completely. Tell all of your friends and family about your plan to quit on that date.  Do not try to slowly cut down on the number of cigarettes you are smoking. Pick a quit date and quit smoking completely starting on that day.  Throw away all cigarettes.   Clean and remove all ashtrays from your home, work, and car.  On a card, write down your reasons for quitting. Carry the card with you and read it  when you get the urge to smoke.  Cleanse your body of nicotine. Drink enough water and fluids to keep your urine clear or pale yellow. Do this after quitting to flush the nicotine from your body.  Learn to predict your moods. Do not let a bad situation be your excuse to have a cigarette. Some situations in your life might tempt you into wanting a cigarette.  Never have "just one" cigarette. It leads to wanting another and another. Remind yourself of your decision to quit.  Change habits associated with smoking. If you smoked while driving or when feeling stressed, try other activities to replace smoking. Stand up when drinking your coffee. Brush your teeth after eating. Sit in a different chair when you read the paper. Avoid alcohol while trying to quit, and try to drink fewer caffeinated beverages. Alcohol and caffeine may urge you to smoke.  Avoid foods and drinks that can trigger a desire to smoke, such as sugary or spicy foods and alcohol.  Ask people who smoke not to smoke around you.  Have something planned to do right after eating or having a cup of coffee. For example, plan to take a walk or exercise.  Try a relaxation exercise to calm you down and decrease your stress. Remember, you may be tense and nervous for the first 2 weeks after you quit, but this will pass.  Find new activities to keep your hands busy. Play with a pen, coin, or rubber band. Doodle or draw things on paper.  Brush your teeth right after eating. This will help cut down on the craving for the taste of tobacco after meals. You can also try mouthwash.   Use oral substitutes in place of cigarettes. Try using lemon drops, carrots, cinnamon sticks, or chewing gum. Keep them handy so they are available when you have the urge to smoke.  When you have the urge to smoke, try deep breathing.  Designate your home as a nonsmoking area.  If you are a heavy smoker, ask your health care provider about a prescription for  nicotine chewing gum. It can ease your withdrawal from nicotine.  Reward yourself. Set aside the cigarette money you save and buy yourself something nice.  Look for support from others. Join a support group or smoking cessation program. Ask someone at home or at work   to help you with your plan to quit smoking.  Always ask yourself, "Do I need this cigarette or is this just a reflex?" Tell yourself, "Today, I choose not to smoke," or "I do not want to smoke." You are reminding yourself of your decision to quit.  Do not replace cigarette smoking with electronic cigarettes (commonly called e-cigarettes). The safety of e-cigarettes is unknown, and some may contain harmful chemicals.  If you relapse, do not give up! Plan ahead and think about what you will do the next time you get the urge to smoke. HOW WILL I FEEL WHEN I QUIT SMOKING? You may have symptoms of withdrawal because your body is used to nicotine (the addictive substance in cigarettes). You may crave cigarettes, be irritable, feel very hungry, cough often, get headaches, or have difficulty concentrating. The withdrawal symptoms are only temporary. They are strongest when you first quit but will go away within 10-14 days. When withdrawal symptoms occur, stay in control. Think about your reasons for quitting. Remind yourself that these are signs that your body is healing and getting used to being without cigarettes. Remember that withdrawal symptoms are easier to treat than the major diseases that smoking can cause.  Even after the withdrawal is over, expect periodic urges to smoke. However, these cravings are generally short lived and will go away whether you smoke or not. Do not smoke! WHAT RESOURCES ARE AVAILABLE TO HELP ME QUIT SMOKING? Your health care provider can direct you to community resources or hospitals for support, which may include:  Group support.  Education.  Hypnosis.  Therapy. Document Released: 04/15/2004 Document  Revised: 12/02/2013 Document Reviewed: 01/03/2013 ExitCare Patient Information 2015 [employer]. This information is not intended to replace advice given to you by your health care provider. Make sure you discuss any questions you have with your health care provider.  

## 2014-07-17 NOTE — Progress Notes (Signed)
Established Carotid Patient   History of Present Illness  Gloria Murray is a 63 y.o. female patient of Dr. Early who is status post left CEA in May of 2010. She returns today for follow up.  Patient has Negative history of TIA or stroke symptom. The patient denies amaurosis fugax or monocular blindness. The patient denies facial drooping. Pt. denies hemiplegia. The patient denies receptive or expressive aphasia. Pt. denies extremity weakness. Pt denies steal symptoms in either arm  Pt reports New Medical or Surgical History: August 02, 2013 colonoscopy, normal per pt, normal mammogram She denies claudication symptoms with walking, denies non healing wounds, denies any cardiac problems.  Pt Diabetic: No Pt smoker: smoker (2 cigarettes/day, decreased from 10 cigarettes/day, started at age 13 yrs) She does lots of walking on her job.  Pt meds include: Statin : Yes ASA: Yes Other anticoagulants/antiplatelets: no  Past Medical History  Diagnosis Date  . Hypertension   . Hyperlipidemia   . Osteoporosis   . Thyroid disease   . Carotid artery occlusion   . Allergy     Social History History  Substance Use Topics  . Smoking status: Light Tobacco Smoker -- 0.50 packs/day    Types: Cigarettes, E-cigarettes  . Smokeless tobacco: Never Used  . Alcohol Use: No    Family History Family History  Problem Relation Age of Onset  . Diabetes Mother   . Colon cancer Neg Hx     Surgical History Past Surgical History  Procedure Laterality Date  . Thyroidectomy    . Tubal ligation  1975    bilateral  . Dilation and curettage of uterus      X 2  . Carpal tunnel release      X 2, right wrist  . Carotid endarterectomy  12/11/2008    left  . Laceration hand Right     hand  . Colonoscopy  Jan. 2, 2015    Allergies  Allergen Reactions  . Propoxyphene Hcl Itching and Nausea Only    REACTION: nausea and itch  . Propoxyphene N-Acetaminophen Itching and Nausea Only   REACTION: nausea and  itch    Current Outpatient Prescriptions  Medication Sig Dispense Refill  . Ascorbic Acid GRAN Take 1 tablet by mouth daily.      . aspirin 81 MG tablet Take 81 mg by mouth daily.      . atenolol (TENORMIN) 50 MG tablet Take one by mouth daily. 90 tablet 1  . Calcium Carbonate-Vitamin D (CALCIUM-VITAMIN D) 600-200 MG-UNIT CAPS Take 1 tablet by mouth 2 (two) times daily.      . Garlic 1000 MG CAPS Take 1 capsule by mouth 2 (two) times daily.      . hydrochlorothiazide (MICROZIDE) 12.5 MG capsule Take 1 capsule (12.5 mg total) by mouth daily. 90 capsule 1  . levothyroxine (SYNTHROID, LEVOTHROID) 112 MCG tablet Take one by mouth daily 90 tablet 1  . loratadine (CLARITIN) 10 MG tablet Take 10 mg by mouth as needed.      . Omega-3 Fatty Acids (FISH OIL) 1000 MG CAPS Take 1 capsule (1,000 mg total) by mouth daily.    . simvastatin (ZOCOR) 40 MG tablet Take one by mouth at bedtime. 90 tablet 3   No current facility-administered medications for this visit.    Review of Systems : See HPI for pertinent positives and negatives.  Physical Examination  Filed Vitals:   07/17/14 0953  BP: 138/90  Pulse: 73  Resp: 18  Height: 5'   6.5" (1.689 m)  Weight: 223 lb (101.152 kg)   Body mass index is 35.46 kg/(m^2).  General: WDWN obese female in NAD GAIT: normal Eyes: PERRLA Pulmonary: Non-labored, CTAB with diminished air movement in all fields, Negative Rales, Negative rhonchi, & Negative wheezing.  Cardiac: regular Rhythm, positive murmur.  VASCULAR EXAM Carotid Bruits Left Right   Positive Positive   Aorta is not palpable. Radial pulses are 2+ palpable and equal.     Gastrointestinal: soft, nontender, BS WNL, no r/g, negative masses.  Musculoskeletal: Negative muscle atrophy/wasting. M/S 5/5 throughout, Extremities without  ischemic changes.  Neurologic: A&O X 3; Appropriate Affect ; SENSATION ;normal;  Speech is normal CN 2-12 intact, Pain and light touch intact in extremities, Motor exam as listed above.   Non-Invasive Vascular Imaging CAROTID DUPLEX 07/17/2014   CEREBROVASCULAR DUPLEX EVALUATION    INDICATION: Carotid artery disease    PREVIOUS INTERVENTION(S): Left carotid endarterectomy 12/11/2008    DUPLEX EXAM: Carotid duplex    RIGHT  LEFT  Peak Systolic Velocities (cm/s) End Diastolic Velocities (cm/s) Plaque LOCATION Peak Systolic Velocities (cm/s) End Diastolic Velocities (cm/s) Plaque  95 25 - CCA PROXIMAL 61 21 -  80 26 - CCA MID 56 24 -  142 56 - CCA DISTAL 316 114 HT  148 21 HT ECA 115 20 -  204 63 CP ICA PROXIMAL 95 29 HT  134 46 CP ICA MID 59 24 -  73 24 - ICA DISTAL 50 22 -    2.5 ICA / CCA Ratio (PSV) N/A  retrograde Vertebral Flow Antegrade  110 Brachial Systolic Pressure (mmHg) 130  Monophasic Brachial Artery Waveforms Triphasic    Plaque Morphology:  HM = Homogeneous, HT = Heterogeneous, CP = Calcific Plaque, SP = Smooth Plaque, IP = Irregular Plaque     ADDITIONAL FINDINGS:     IMPRESSION: 1. Known right subchavian steal. 2. 40 - 59% right internal carotid artery stenosis, upper end of range 3. Severe stenosis at site of left patch in the mid to distal  common carotid artery with less than 40% left internal carotid artery stenosis.    Compared to the previous exam:  Increased velocity at left patch site      Assessment: Gloria Murray is a 63 y.o. female who is status post left CEA in May of 2010. She presents with asymptomatic known right vertebral retrograde flow, 40 - 59% right internal carotid artery stenosis, upper end of range, and severe stenosis at site of left patch in the mid to distal  common carotid artery (316/114  PSV/EDV cm/s) with less than 40% left internal carotid artery stenosis. The  ICA stenosis has increased velocity at left patch site compared  to previous exam.  Unfortunately the patient continues to smoke and was counseled re smoking cessation and given several free resources re smoking cessation.   Plan: Based on today's exam and carotid Duplex results, and after discussing with Dr. Fields, pt will be scheduled for carotid and arch angiogram by Dr. Early on Dec. 23, 2015 and follow up with Dr. Early.   I discussed in depth with the patient the nature of atherosclerosis, and emphasized the importance of maximal medical management including strict control of blood pressure, blood glucose, and lipid levels, obtaining regular exercise, and cessation of smoking.  The patient is aware that without maximal medical management the underlying atherosclerotic disease process will progress, limiting the benefit of any interventions. The patient was given information about stroke prevention and what symptoms   should prompt the patient to seek immediate medical care. Thank you for allowing us to participate in this patient's care.  Suzanne Nickel, RN, MSN, FNP-C [facility] [place] Office: [phone]  Clinic Physician: Fields  07/17/2014 9:59 AM  

## 2014-07-22 MED ORDER — SODIUM CHLORIDE 0.9 % IV SOLN: INTRAVENOUS | Status: DC

## 2014-07-23 ENCOUNTER — Encounter (HOSPITAL_COMMUNITY): Payer: Self-pay | Admitting: Vascular Surgery

## 2014-07-23 ENCOUNTER — Ambulatory Visit (HOSPITAL_COMMUNITY)
Admission: RE | Admit: 2014-07-23 | Discharge: 2014-07-23 | Disposition: A | Discharge status: Home / Self Care | Payer: Commercial Managed Care - HMO | Source: Ambulatory Visit | Attending: Vascular Surgery | Admitting: Vascular Surgery

## 2014-07-23 ENCOUNTER — Encounter (HOSPITAL_COMMUNITY)
Admission: RE | Disposition: A | Discharge status: Home / Self Care | Payer: Self-pay | Source: Ambulatory Visit | Attending: Vascular Surgery

## 2014-07-23 DIAGNOSIS — I6523 Occlusion and stenosis of bilateral carotid arteries: Secondary | ICD-10-CM | POA: Insufficient documentation

## 2014-07-23 DIAGNOSIS — I10 Essential (primary) hypertension: Secondary | ICD-10-CM | POA: Diagnosis not present

## 2014-07-23 DIAGNOSIS — Z7982 Long term (current) use of aspirin: Secondary | ICD-10-CM | POA: Diagnosis not present

## 2014-07-23 DIAGNOSIS — F17210 Nicotine dependence, cigarettes, uncomplicated: Secondary | ICD-10-CM | POA: Diagnosis not present

## 2014-07-23 DIAGNOSIS — I6522 Occlusion and stenosis of left carotid artery: Secondary | ICD-10-CM | POA: Diagnosis present

## 2014-07-23 DIAGNOSIS — M810 Age-related osteoporosis without current pathological fracture: Secondary | ICD-10-CM | POA: Insufficient documentation

## 2014-07-23 DIAGNOSIS — E785 Hyperlipidemia, unspecified: Secondary | ICD-10-CM | POA: Diagnosis not present

## 2014-07-23 HISTORY — PX: ARCH AORTOGRAM: SHX5501

## 2014-07-23 HISTORY — PX: CAROTID ANGIOGRAM: SHX5504

## 2014-07-23 LAB — POCT I-STAT, CHEM 8
BUN: 15 mg/dL (ref 6–23)
Calcium, Ion: 1.22 mmol/L (ref 1.13–1.30)
Chloride: 104 mEq/L (ref 96–112)
Creatinine, Ser: 0.8 mg/dL (ref 0.50–1.10)
GLUCOSE: 123 mg/dL — AB (ref 70–99)
HEMATOCRIT: 46 % (ref 36.0–46.0)
HEMOGLOBIN: 15.6 g/dL — AB (ref 12.0–15.0)
POTASSIUM: 3.7 mmol/L (ref 3.5–5.1)
Sodium: 142 mmol/L (ref 135–145)
TCO2: 26 mmol/L (ref 0–100)

## 2014-07-23 SURGERY — CAROTID ANGIOGRAM
Anesthesia: LOCAL

## 2014-07-23 MED ORDER — LIDOCAINE HCL (PF) 1 % IJ SOLN
INTRAMUSCULAR | Status: AC
  Filled 2014-07-23: qty 30

## 2014-07-23 MED ORDER — SODIUM CHLORIDE 0.9 % IV SOLN: INTRAVENOUS | Status: DC

## 2014-07-23 MED ORDER — HEPARIN (PORCINE) IN NACL 2-0.9 UNIT/ML-% IJ SOLN
INTRAMUSCULAR | Status: AC
  Filled 2014-07-23: qty 1000

## 2014-07-23 NOTE — Progress Notes (Signed)
No neuro deficits. PERRL. Upper and lower extremities equal bilaterally in strength. Speech clear and appropriate. Smile is symmetrical. Tongue midline.

## 2014-07-23 NOTE — H&P (View-Only) (Signed)
Established Carotid Patient   History of Present Illness  Gloria Murray is a 63 y.o. female patient of Dr. Donnetta Hutching who is status post left CEA in May of 2010. She returns today for follow up.  Patient has Negative history of TIA or stroke symptom. The patient denies amaurosis fugax or monocular blindness. The patient denies facial drooping. Pt. denies hemiplegia. The patient denies receptive or expressive aphasia. Pt. denies extremity weakness. Pt denies steal symptoms in either arm  Pt reports New Medical or Surgical History: August 02, 2013 colonoscopy, normal per pt, normal mammogram She denies claudication symptoms with walking, denies non healing wounds, denies any cardiac problems.  Pt Diabetic: No Pt smoker: smoker (2 cigarettes/day, decreased from 10 cigarettes/day, started at age 62 yrs) She does lots of walking on her job.  Pt meds include: Statin : Yes ASA: Yes Other anticoagulants/antiplatelets: no  Past Medical History  Diagnosis Date  . Hypertension   . Hyperlipidemia   . Osteoporosis   . Thyroid disease   . Carotid artery occlusion   . Allergy     Social History History  Substance Use Topics  . Smoking status: Light Tobacco Smoker -- 0.50 packs/day    Types: Cigarettes, E-cigarettes  . Smokeless tobacco: Never Used  . Alcohol Use: No    Family History Family History  Problem Relation Age of Onset  . Diabetes Mother   . Colon cancer Neg Hx     Surgical History Past Surgical History  Procedure Laterality Date  . Thyroidectomy    . Tubal ligation  1975    bilateral  . Dilation and curettage of uterus      X 2  . Carpal tunnel release      X 2, right wrist  . Carotid endarterectomy  12/11/2008    left  . Laceration hand Right     hand  . Colonoscopy  Jan. 2, 2015    Allergies  Allergen Reactions  . Propoxyphene Hcl Itching and Nausea Only    REACTION: nausea and itch  . Propoxyphene N-Acetaminophen Itching and Nausea Only   REACTION: nausea and  itch    Current Outpatient Prescriptions  Medication Sig Dispense Refill  . Ascorbic Acid GRAN Take 1 tablet by mouth daily.      Marland Kitchen aspirin 81 MG tablet Take 81 mg by mouth daily.      Marland Kitchen atenolol (TENORMIN) 50 MG tablet Take one by mouth daily. 90 tablet 1  . Calcium Carbonate-Vitamin D (CALCIUM-VITAMIN D) 600-200 MG-UNIT CAPS Take 1 tablet by mouth 2 (two) times daily.      . Garlic 7062 MG CAPS Take 1 capsule by mouth 2 (two) times daily.      . hydrochlorothiazide (MICROZIDE) 12.5 MG capsule Take 1 capsule (12.5 mg total) by mouth daily. 90 capsule 1  . levothyroxine (SYNTHROID, LEVOTHROID) 112 MCG tablet Take one by mouth daily 90 tablet 1  . loratadine (CLARITIN) 10 MG tablet Take 10 mg by mouth as needed.      . Omega-3 Fatty Acids (FISH OIL) 1000 MG CAPS Take 1 capsule (1,000 mg total) by mouth daily.    . simvastatin (ZOCOR) 40 MG tablet Take one by mouth at bedtime. 90 tablet 3   No current facility-administered medications for this visit.    Review of Systems : See HPI for pertinent positives and negatives.  Physical Examination  Filed Vitals:   07/17/14 0953  BP: 138/90  Pulse: 73  Resp: 18  Height: 5'  6.5" (1.689 m)  Weight: 223 lb (101.152 kg)   Body mass index is 35.46 kg/(m^2).  General: WDWN obese female in NAD GAIT: normal Eyes: PERRLA Pulmonary: Non-labored, CTAB with diminished air movement in all fields, Negative Rales, Negative rhonchi, & Negative wheezing.  Cardiac: regular Rhythm, positive murmur.  VASCULAR EXAM Carotid Bruits Left Right   Positive Positive   Aorta is not palpable. Radial pulses are 2+ palpable and equal.     Gastrointestinal: soft, nontender, BS WNL, no r/g, negative masses.  Musculoskeletal: Negative muscle atrophy/wasting. M/S 5/5 throughout, Extremities without  ischemic changes.  Neurologic: A&O X 3; Appropriate Affect ; SENSATION ;normal;  Speech is normal CN 2-12 intact, Pain and light touch intact in extremities, Motor exam as listed above.   Non-Invasive Vascular Imaging CAROTID DUPLEX 07/17/2014   CEREBROVASCULAR DUPLEX EVALUATION    INDICATION: Carotid artery disease    PREVIOUS INTERVENTION(S): Left carotid endarterectomy 12/11/2008    DUPLEX EXAM: Carotid duplex    RIGHT  LEFT  Peak Systolic Velocities (cm/s) End Diastolic Velocities (cm/s) Plaque LOCATION Peak Systolic Velocities (cm/s) End Diastolic Velocities (cm/s) Plaque  95 25 - CCA PROXIMAL 61 21 -  80 26 - CCA MID 56 24 -  142 56 - CCA DISTAL 316 114 HT  148 21 HT ECA 115 20 -  204 63 CP ICA PROXIMAL 95 29 HT  134 46 CP ICA MID 59 24 -  73 24 - ICA DISTAL 50 22 -    2.5 ICA / CCA Ratio (PSV) N/A  retrograde Vertebral Flow Antegrade  998 Brachial Systolic Pressure (mmHg) 338  Monophasic Brachial Artery Waveforms Triphasic    Plaque Morphology:  HM = Homogeneous, HT = Heterogeneous, CP = Calcific Plaque, SP = Smooth Plaque, IP = Irregular Plaque     ADDITIONAL FINDINGS:     IMPRESSION: 1. Known right subchavian steal. 2. 40 - 59% right internal carotid artery stenosis, upper end of range 3. Severe stenosis at site of left patch in the mid to distal  common carotid artery with less than 40% left internal carotid artery stenosis.    Compared to the previous exam:  Increased velocity at left patch site      Assessment: Gloria Murray is a 63 y.o. female who is status post left CEA in May of 2010. She presents with asymptomatic known right vertebral retrograde flow, 40 - 59% right internal carotid artery stenosis, upper end of range, and severe stenosis at site of left patch in the mid to distal  common carotid artery (316/114  PSV/EDV cm/s) with less than 40% left internal carotid artery stenosis. The  ICA stenosis has increased velocity at left patch site compared  to previous exam.  Unfortunately the patient continues to smoke and was counseled re smoking cessation and given several free resources re smoking cessation.   Plan: Based on today's exam and carotid Duplex results, and after discussing with Dr. Oneida Alar, pt will be scheduled for carotid and arch angiogram by Dr. Donnetta Hutching on Dec. 23, 2015 and follow up with Dr. Donnetta Hutching.   I discussed in depth with the patient the nature of atherosclerosis, and emphasized the importance of maximal medical management including strict control of blood pressure, blood glucose, and lipid levels, obtaining regular exercise, and cessation of smoking.  The patient is aware that without maximal medical management the underlying atherosclerotic disease process will progress, limiting the benefit of any interventions. The patient was given information about stroke prevention and what symptoms  should prompt the patient to seek immediate medical care. Thank you for allowing [place] to participate in this patient's care.  Clemon Chambers, RN, MSN, FNP-C [facility] Office: [facility] Physician: Oneida Alar  07/17/2014 9:59 AM

## 2014-07-23 NOTE — Interval H&P Note (Signed)
History and Physical Interval Note:  07/23/2014 11:33 AM  Gloria Murray  has presented today for surgery, with the diagnosis of left ICA stenosis  The various methods of treatment have been discussed with the patient and family. After consideration of risks, benefits and other options for treatment, the patient has consented to  Procedure(s): CAROTID ANGIOGRAM (N/A) ARCH AORTOGRAM (N/A) as a surgical intervention .  The patient's history has been reviewed, patient examined, no change in status, stable for surgery.  I have reviewed the patient's chart and labs.  Questions were answered to the patient's satisfaction.     [name]

## 2014-07-23 NOTE — Op Note (Signed)
    OPERATIVE REPORT  DATE OF SURGERY: 07/23/2014  PATIENT: Gloria Murray, 63 y.o. female MRN: [id number]  DOB: February 17, 1951  PRE-OPERATIVE DIAGNOSIS: Asymptomatic left common carotid artery stenosis, recurrent  POST-OPERATIVE DIAGNOSIS:  Same  PROCEDURE: Arch and cerebral arteriogram  SURGEON:  Curt Jews, M.D.  PHYSICIAN ASSISTANT: Nurse  ANESTHESIA:  1% lidocaine local  EBL: None ml     BLOOD ADMINISTERED: None  DRAINS: None  SPECIMEN: None  COUNTS CORRECT:  YES  PLAN OF CARE: Holding area stable   PATIENT DISPOSITION:  PACU - hemodynamically stable  PROCEDURE DETAILS: The patient was taken to the peripheral vascular lab and placed in supine position where the area of the right groin was prepped and draped in the usual sterile fashion. Using local anesthesia and SonoSite ultrasound visualization, the right common femoral artery was entered with an 18-gauge needle. A guidewire was passed through the abdominal aorta and this was confirmed with fluoroscopy. A 5 French sheath was passed over the guidewire. A pigtail catheter was positioned at the level of the ascending arch and a LAO projection was undertaken. This revealed normal arch anatomy. There was widely patent innominate and proximal left common carotid there was moderate narrowing of the left subclavian artery takeoff. The right vertebral artery was occluded. The patient had a very large dominant left vertebral artery. Next the pigtail catheter was exchanged for a H1 catheter and the innominate and then the right common carotid artery were selectively catheterized. Injections of the right carotid in the AP and lateral and also intracranial injections were undertaken. Interpretation of intracranial usual be dictated as a separate note by [facility] radiology. There was moderate narrowing with atherosclerotic change present at the bifurcation into the internal carotid artery. There was marked tortuosity but no critical  stenosis. H1 catheter was then exchanged for a Bernstein catheter. The left common carotid artery wasn't selectively catheterized. Again AP and lateral cervical and intracranial views were undertaken. The patient had a prior left carotid endarterectomy and patch angioplasty. There was a extremely focal severe stenosis in the common carotid artery at the proximal end of the patch. The remaining endarterectomy and internal carotid artery were totally normal. Tolerated chief without immediate complication the catheter was removed and the sheath was removed and pressure was held for hemostasis.  Findings 1 normal arch anatomy with no critical stenosis #2 tortuosity of the right internal carotid artery with no severe stenosis #3 high-grade stenosis the probable patch of prior left carotid endarterectomy  I discussed this at length with the patient following the procedure. Have recommended stenting of the focal recurrent stenosis in the left common carotid artery. No evidence of extension into the internal carotid artery. Feel that this be less risky than redo for this extremely focal stenosis. We will schedule this at her earliest convenience.   Curt Jews, M.D. 07/23/2014 4:50 PM

## 2014-07-23 NOTE — Discharge Instructions (Signed)

## 2014-07-23 NOTE — Progress Notes (Signed)
Neuro assessment stable, no deficits.

## 2014-07-23 NOTE — Progress Notes (Signed)
Site area: rt groin Site Prior to Removal:  Level 0 Pressure Applied For: 20 minutes Manual:   yes Patient Status During Pull:  stable Post Pull Site:  Level 0 Post Pull Instructions Given:  yes Post Pull Pulses Present: yes Dressing Applied:  tegaderm Bedrest begins @ 1300 Comments: no complications

## 2014-07-29 ENCOUNTER — Other Ambulatory Visit: Payer: Self-pay

## 2014-09-02 MED ORDER — SODIUM CHLORIDE 0.9 % IV SOLN: INTRAVENOUS | Status: DC

## 2014-09-03 ENCOUNTER — Other Ambulatory Visit: Payer: Self-pay

## 2014-09-03 ENCOUNTER — Ambulatory Visit (HOSPITAL_COMMUNITY)
Admission: RE | Admit: 2014-09-03 | Discharge: 2014-09-03 | Disposition: A | Discharge status: Home / Self Care | Payer: Commercial Managed Care - HMO | Source: Ambulatory Visit | Attending: Surgery | Admitting: Surgery

## 2014-09-03 ENCOUNTER — Encounter (HOSPITAL_COMMUNITY)
Admission: RE | Disposition: A | Discharge status: Home / Self Care | Payer: Self-pay | Source: Ambulatory Visit | Attending: Surgery

## 2014-09-03 DIAGNOSIS — I6522 Occlusion and stenosis of left carotid artery: Secondary | ICD-10-CM

## 2014-09-03 SURGERY — CAROTID STENT INSERTION
Anesthesia: LOCAL

## 2014-09-03 MED ORDER — CLOPIDOGREL BISULFATE 75 MG PO TABS: 75.0000 mg | ORAL_TABLET | Freq: Every day | ORAL | Status: DC

## 2014-09-05 ENCOUNTER — Other Ambulatory Visit: Payer: Self-pay

## 2014-09-23 ENCOUNTER — Other Ambulatory Visit: Payer: Self-pay

## 2014-10-13 MED ORDER — SODIUM CHLORIDE 0.9 % IV SOLN
INTRAVENOUS | Status: DC
  Administered 2014-10-14: 06:00:00 via INTRAVENOUS

## 2014-10-13 NOTE — H&P (Signed)
Patient name: Gloria Murray MRN: [id number] DOB: July 05, 1951 Sex: female    No chief complaint on file.   HISTORY OF PRESENT ILLNESS: This is a patient of Dr. early who is status post left carotid endarterectomy in May 2010.  Ultrasound recently identified a velocity elevation.  Dr. early performed carotid angiography which revealed a high-grade stenosis at the proximal patch.  She was originally scheduled for left carotid stenting but had not started aspirin and Plavix regimen therefore procedure was canceled.  She is here today for follow-up procedure  Past Medical History  Diagnosis Date  . Hypertension   . Hyperlipidemia   . Osteoporosis   . Thyroid disease   . Carotid artery occlusion   . Allergy     Past Surgical History  Procedure Laterality Date  . Thyroidectomy    . Tubal ligation  1975    bilateral  . Dilation and curettage of uterus      X 2  . Carpal tunnel release      X 2, right wrist  . Carotid endarterectomy  12/11/2008    left  . Laceration hand Right     hand  . Colonoscopy  Jan. 2, 2015  . Carotid angiogram N/A 07/23/2014    Procedure: CAROTID ANGIOGRAM;  Surgeon: Rosetta Posner, MD;  Location: [facility] CATH LAB;  Service: Cardiovascular;  Laterality: N/A;  . Arch aortogram N/A 07/23/2014    Procedure: ARCH AORTOGRAM;  Surgeon: Rosetta Posner, MD;  Location: [facility] CATH LAB;  Service: Cardiovascular;  Laterality: N/A;    History   Social History  . Marital Status: Widowed    Spouse Name: N/A  . Number of Children: N/A  . Years of Education: N/A   Occupational History  . [employer]    Social History Main Topics  . Smoking status: Light Tobacco Smoker -- 0.50 packs/day    Types: Cigarettes, E-cigarettes  . Smokeless tobacco: Never Used  . Alcohol Use: No  . Drug Use: No  . Sexual Activity: Not on file   Other Topics Concern  . Not on file   Social History Narrative   Does not have a living will.   Does not have any family.      [name]  would "make decisions" for her-desires CPR, does not want prolonged life support if futile.      Both of her children- died in car accident at ages 4 and 26- husband went into a diabetic coma while driving.    Family History  Problem Relation Age of Onset  . Diabetes Mother   . Colon cancer Neg Hx     Allergies as of 09/05/2014 - Review Complete 09/03/2014  Allergen Reaction Noted  . Propoxyphene hcl Itching and Nausea Only 10/30/2006  . Propoxyphene n-acetaminophen Itching and Nausea Only 10/30/2006    No current facility-administered medications on file prior to encounter.   Current Outpatient Prescriptions on File Prior to Encounter  Medication Sig Dispense Refill  . Ascorbic Acid GRAN Take 1 tablet by mouth daily.      Marland Kitchen aspirin 81 MG tablet Take 81 mg by mouth daily.      Marland Kitchen atenolol (TENORMIN) 50 MG tablet Take one by mouth daily. 90 tablet 1  . Calcium Carbonate-Vitamin D (CALCIUM-VITAMIN D) 600-200 MG-UNIT CAPS Take 1 tablet by mouth 2 (two) times daily.      . clopidogrel (PLAVIX) 75 MG tablet Take 1 tablet (75 mg total) by mouth daily. [place]  tablet 1  . Garlic 4496 MG CAPS Take 1 capsule by mouth 2 (two) times daily.      . hydrochlorothiazide (MICROZIDE) 12.5 MG capsule Take 1 capsule (12.5 mg total) by mouth daily. 90 capsule 1  . levothyroxine (SYNTHROID, LEVOTHROID) 112 MCG tablet Take one by mouth daily 90 tablet 1  . loratadine (CLARITIN) 10 MG tablet Take 10 mg by mouth as needed for allergies.     . Omega-3 Fatty Acids (FISH OIL) 1000 MG CAPS Take 1 capsule (1,000 mg total) by mouth daily.    . simvastatin (ZOCOR) 40 MG tablet Take one by mouth at bedtime. 90 tablet 3      PHYSICAL EXAMINATION: General: WDWN obese female in NAD GAIT: normal Eyes: PERRLA Pulmonary: Non-labored, CTAB with diminished air movement in all fields, Negative Rales, Negative rhonchi, & Negative wheezing.  Cardiac: regular Rhythm, positive murmur.  VASCULAR EXAM Carotid Bruits  Left Right   Positive Positive   Aorta is not palpable. Radial pulses are 2+ palpable and equal.     Gastrointestinal: soft, nontender, BS WNL, no r/g, negative masses.  Musculoskeletal: Negative muscle atrophy/wasting. M/S 5/5 throughout, Extremities without ischemic changes.  Neurologic: A&O X 3; Appropriate Affect ; SENSATION ;normal;  Speech is normal CN 2-12 intact, Pain and light touch intact in extremities, Motor exam as listed above.   Non-Invasive Vascular Imaging CAROTID DUPLEX 07/17/2014  CEREBROVASCULAR DUPLEX EVALUATION    INDICATION: Carotid artery disease    PREVIOUS INTERVENTION(S): Left carotid endarterectomy 12/11/2008    DUPLEX EXAM: Carotid duplex    RIGHT  LEFT  Peak Systolic Velocities (cm/s) End Diastolic Velocities (cm/s) Plaque LOCATION Peak Systolic Velocities (cm/s) End Diastolic Velocities (cm/s) Plaque  95 25 - CCA PROXIMAL 61 21 -  80 26 - CCA MID 56 24 -  142 56 - CCA DISTAL 316 114 HT  148 21 HT ECA 115 20 -  204 63 CP ICA PROXIMAL 95 29 HT  134 46 CP ICA MID 59 24 -  73 24 - ICA DISTAL 50 22 -    2.5 ICA / CCA Ratio (PSV) N/A  retrograde Vertebral Flow Antegrade  759 Brachial Systolic Pressure (mmHg) 163  Monophasic Brachial Artery Waveforms Triphasic    Plaque Morphology: HM = Homogeneous, HT = Heterogeneous, CP = Calcific Plaque, SP = Smooth Plaque, IP = Irregular Plaque     ADDITIONAL FINDINGS:     IMPRESSION: 1. Known right subchavian steal. 2. 40 - 59% right internal carotid artery stenosis, upper end of range 3. Severe stenosis at site of left patch in the mid to distal common carotid artery with less than 40% left internal carotid artery stenosis.    Compared to the previous exam:  Increased  velocity at left patch site              Assessment: Recurrent left carotid stenosis Plan: Discussed proceeding with left carotid stent placed.  Risks and benefits of the procedure were discussed.  Eldridge Abrahams, M.D. [facility] [place] Office: [phone] Pager:  [phone]

## 2014-10-14 ENCOUNTER — Encounter (HOSPITAL_COMMUNITY)
Admission: RE | Disposition: A | Discharge status: Home / Self Care | Payer: Commercial Managed Care - HMO | Source: Ambulatory Visit | Attending: Surgery

## 2014-10-14 ENCOUNTER — Inpatient Hospital Stay (HOSPITAL_COMMUNITY)
Admission: RE | Admit: 2014-10-14 | Discharge: 2014-10-15 | DRG: 036 | Disposition: A | Discharge status: Home / Self Care | Payer: Commercial Managed Care - HMO | Source: Ambulatory Visit | Attending: Surgery | Admitting: Surgery

## 2014-10-14 ENCOUNTER — Encounter (HOSPITAL_COMMUNITY): Payer: Self-pay | Admitting: *Deleted

## 2014-10-14 ENCOUNTER — Other Ambulatory Visit: Payer: Self-pay | Admitting: *Deleted

## 2014-10-14 DIAGNOSIS — F17210 Nicotine dependence, cigarettes, uncomplicated: Secondary | ICD-10-CM | POA: Diagnosis present

## 2014-10-14 DIAGNOSIS — Z79899 Other long term (current) drug therapy: Secondary | ICD-10-CM | POA: Diagnosis not present

## 2014-10-14 DIAGNOSIS — Z7982 Long term (current) use of aspirin: Secondary | ICD-10-CM | POA: Diagnosis not present

## 2014-10-14 DIAGNOSIS — Z7902 Long term (current) use of antithrombotics/antiplatelets: Secondary | ICD-10-CM

## 2014-10-14 DIAGNOSIS — E785 Hyperlipidemia, unspecified: Secondary | ICD-10-CM | POA: Diagnosis not present

## 2014-10-14 DIAGNOSIS — I6523 Occlusion and stenosis of bilateral carotid arteries: Principal | ICD-10-CM | POA: Diagnosis present

## 2014-10-14 DIAGNOSIS — I10 Essential (primary) hypertension: Secondary | ICD-10-CM | POA: Diagnosis present

## 2014-10-14 DIAGNOSIS — Z959 Presence of cardiac and vascular implant and graft, unspecified: Secondary | ICD-10-CM

## 2014-10-14 DIAGNOSIS — Z885 Allergy status to narcotic agent status: Secondary | ICD-10-CM

## 2014-10-14 DIAGNOSIS — M810 Age-related osteoporosis without current pathological fracture: Secondary | ICD-10-CM | POA: Diagnosis present

## 2014-10-14 DIAGNOSIS — I6522 Occlusion and stenosis of left carotid artery: Secondary | ICD-10-CM | POA: Diagnosis not present

## 2014-10-14 DIAGNOSIS — E079 Disorder of thyroid, unspecified: Secondary | ICD-10-CM | POA: Diagnosis present

## 2014-10-14 DIAGNOSIS — I6529 Occlusion and stenosis of unspecified carotid artery: Secondary | ICD-10-CM | POA: Diagnosis present

## 2014-10-14 HISTORY — PX: CAROTID STENT INSERTION: SHX5505

## 2014-10-14 LAB — POCT I-STAT, CHEM 8
BUN: 17 mg/dL (ref 6–23)
Calcium, Ion: 1.19 mmol/L (ref 1.13–1.30)
Chloride: 104 mmol/L (ref 96–112)
Creatinine, Ser: 0.8 mg/dL (ref 0.50–1.10)
Glucose, Bld: 112 mg/dL — ABNORMAL HIGH (ref 70–99)
HEMATOCRIT: 48 % — AB (ref 36.0–46.0)
HEMOGLOBIN: 16.3 g/dL — AB (ref 12.0–15.0)
POTASSIUM: 3.9 mmol/L (ref 3.5–5.1)
SODIUM: 142 mmol/L (ref 135–145)
TCO2: 24 mmol/L (ref 0–100)

## 2014-10-14 LAB — POCT ACTIVATED CLOTTING TIME: Activated Clotting Time: 331 seconds

## 2014-10-14 SURGERY — CAROTID STENT INSERTION
Anesthesia: LOCAL | Laterality: Left

## 2014-10-14 MED ORDER — LABETALOL HCL 5 MG/ML IV SOLN: 10.0000 mg | INTRAVENOUS | Status: DC | PRN

## 2014-10-14 MED ORDER — CLOPIDOGREL BISULFATE 75 MG PO TABS
ORAL_TABLET | ORAL | Status: AC
  Administered 2014-10-14: 75 mg
  Filled 2014-10-14: qty 1

## 2014-10-14 MED ORDER — LIDOCAINE HCL (PF) 1 % IJ SOLN
INTRAMUSCULAR | Status: AC
  Filled 2014-10-14: qty 30

## 2014-10-14 MED ORDER — ASPIRIN EC 81 MG PO TBEC
81.0000 mg | DELAYED_RELEASE_TABLET | Freq: Every day | ORAL | Status: DC
  Filled 2014-10-14: qty 1

## 2014-10-14 MED ORDER — HYDRALAZINE HCL 20 MG/ML IJ SOLN: 5.0000 mg | INTRAMUSCULAR | Status: DC | PRN

## 2014-10-14 MED ORDER — ATROPINE SULFATE 0.1 MG/ML IJ SOLN
INTRAMUSCULAR | Status: AC
  Filled 2014-10-14: qty 10

## 2014-10-14 MED ORDER — ASPIRIN 81 MG PO CHEW
CHEWABLE_TABLET | ORAL | Status: AC
  Administered 2014-10-14: 81 mg
  Filled 2014-10-14: qty 1

## 2014-10-14 MED ORDER — LEVOTHYROXINE SODIUM 112 MCG PO TABS
112.0000 ug | ORAL_TABLET | Freq: Every day | ORAL | Status: DC
  Administered 2014-10-15: 112 ug via ORAL
  Filled 2014-10-14 (×2): qty 1

## 2014-10-14 MED ORDER — SODIUM CHLORIDE 0.9 % IV SOLN: INTRAVENOUS | Status: DC

## 2014-10-14 MED ORDER — NOREPINEPHRINE BITARTRATE 1 MG/ML IV SOLN
INTRAVENOUS | Status: AC
  Filled 2014-10-14: qty 4

## 2014-10-14 MED ORDER — ATORVASTATIN CALCIUM 40 MG PO TABS
40.0000 mg | ORAL_TABLET | Freq: Every day | ORAL | Status: DC
  Administered 2014-10-14: 40 mg via ORAL
  Filled 2014-10-14 (×2): qty 1

## 2014-10-14 MED ORDER — MORPHINE SULFATE 10 MG/ML IJ SOLN: 2.0000 mg | INTRAMUSCULAR | Status: DC | PRN

## 2014-10-14 MED ORDER — BIVALIRUDIN 250 MG IV SOLR
INTRAVENOUS | Status: AC
  Filled 2014-10-14: qty 250

## 2014-10-14 MED ORDER — PHENOL 1.4 % MT LIQD: 1.0000 | OROMUCOSAL | Status: DC | PRN

## 2014-10-14 MED ORDER — ALUM & MAG HYDROXIDE-SIMETH 200-200-20 MG/5ML PO SUSP: 15.0000 mL | ORAL | Status: DC | PRN

## 2014-10-14 MED ORDER — METOPROLOL TARTRATE 1 MG/ML IV SOLN: 2.0000 mg | INTRAVENOUS | Status: DC | PRN

## 2014-10-14 MED ORDER — OXYCODONE HCL 5 MG PO TABS: 5.0000 mg | ORAL_TABLET | ORAL | Status: DC | PRN

## 2014-10-14 MED ORDER — MORPHINE SULFATE 2 MG/ML IJ SOLN: 2.0000 mg | INTRAMUSCULAR | Status: DC | PRN

## 2014-10-14 MED ORDER — HEPARIN (PORCINE) IN NACL 2-0.9 UNIT/ML-% IJ SOLN
INTRAMUSCULAR | Status: AC
  Filled 2014-10-14: qty 1000

## 2014-10-14 MED ORDER — ATENOLOL 25 MG PO TABS
25.0000 mg | ORAL_TABLET | Freq: Two times a day (BID) | ORAL | Status: DC
  Filled 2014-10-14 (×3): qty 1

## 2014-10-14 MED ORDER — LORATADINE 10 MG PO TABS
10.0000 mg | ORAL_TABLET | Freq: Every day | ORAL | Status: DC | PRN
  Filled 2014-10-14: qty 1

## 2014-10-14 MED ORDER — CLOPIDOGREL BISULFATE 75 MG PO TABS
75.0000 mg | ORAL_TABLET | Freq: Every day | ORAL | Status: DC
  Filled 2014-10-14 (×2): qty 1

## 2014-10-14 MED ORDER — BIVALIRUDIN 250 MG IV SOLR
0.2500 mg/kg/h | INTRAVENOUS | Status: DC
  Filled 2014-10-14 (×2): qty 250

## 2014-10-14 MED ORDER — HYDROCHLOROTHIAZIDE 12.5 MG PO CAPS
12.5000 mg | ORAL_CAPSULE | Freq: Every day | ORAL | Status: DC
  Administered 2014-10-14: 12.5 mg via ORAL
  Filled 2014-10-14 (×2): qty 1

## 2014-10-14 MED ORDER — GUAIFENESIN-DM 100-10 MG/5ML PO SYRP: 15.0000 mL | ORAL_SOLUTION | ORAL | Status: DC | PRN

## 2014-10-14 MED ORDER — ONDANSETRON HCL 4 MG/2ML IJ SOLN: 4.0000 mg | Freq: Four times a day (QID) | INTRAMUSCULAR | Status: DC | PRN

## 2014-10-14 NOTE — Interval H&P Note (Signed)
History and Physical Interval Note:  10/14/2014 7:04 AM  Gloria Murray  has presented today for surgery, with the diagnosis of carotid stenosis  The various methods of treatment have been discussed with the patient and family. After consideration of risks, benefits and other options for treatment, the patient has consented to  Procedure(s): CAROTID STENT INSERTION (N/A) as a surgical intervention .  The patient's history has been reviewed, patient examined, no change in status, stable for surgery.  I have reviewed the patient's chart and labs.  Questions were answered to the patient's satisfaction.     Aracelie Addis IV, V. WELLS

## 2014-10-14 NOTE — Progress Notes (Signed)
Neuro assessment: Alert and oriented X 3. Speech clear and appropriate. PERRL. Smile symmetrical, tongue midline. Upper and lower extremities equal bilaterally in strength. No deficits.

## 2014-10-14 NOTE — Progress Notes (Signed)
Neuro assessment unchanged; no deficits.  

## 2014-10-14 NOTE — Op Note (Signed)
    Patient name: Gloria Murray MRN: [id number] DOB: [birth year] Sex: female  10/14/2014 Pre-operative Diagnosis: Recurrent left carotid stenosis Post-operative diagnosis:  Same Surgeon:  Eldridge Abrahams  Co-physician: Quay Burow, MD Procedure Performed:  1.  Ultrasound-guided access, right femoral artery  2.  Left carotid stenting with distal embolic protection    Indications:  The patient has previously undergone left carotid endarterectomy.  She has had a ultrasound confirmed by angiography which reveals a recurrent stenosis and is here today for stenting  Findings: 95% stenosis at the proximal patch.  This was treated to less than 5% stenosis using a 10 x 8 x 30, followed by a 9 x 30 XACT stent with a large NAV 6 filter  Procedure:  The patient was identified in the holding area and taken to room 8.  The patient was then placed supine on the table and prepped and draped in the usual sterile fashion.  A time out was called.  Ultrasound was used to evaluate the right common femoral artery.  It was patent .  A digital ultrasound image was acquired.  A micropuncture needle was used to access the right common femoral artery under ultrasound guidance.  An 018 wire was advanced without resistance and a micropuncture sheath was placed.  The 018 wire was removed and a benson wire was placed.  The micropuncture sheath was exchanged for a long 6 [place] sheath.  We had difficulty cannulating the left common carotid artery using a JB-1 catheter and a headhunter catheter.  Ultimately with a V-Tech catheter the left common carotid artery was selected and confirmatory imaging with intracranial pictures was acquired.  Which revealed a 95% stenosis at the proximal patch of the left common carotid artery.    Intervention:  Once diagnostic images were acquired and confirmed, the patient was given a bolus, followed by continuous infusion of Angiomax.  Once the ACT was confirmed to be greater than 300, the  6 French sheath was advanced into the mid left common carotid artery.  A large NAV-6 filter was advanced across the stenosis and positioned into the distal internal carotid artery.  It was then deployed.  Next, the stenosis was predilated using a 3 x 20 balloon.  A XACT 10 x 8 x 30 stent was selected and positioned across the stenosis.  It was deployed.  Unfortunately the stent jumped forward and missed the stenosis.  Therefore we selected a 9 x 30 stent and deployed this to successfully treat the lesion.  As was then postdilated with a 6 mm balloon.  Completion imaging revealed resolution of the stenosis to less than 5%.  Completion imaging of the intracranial system was then acquired.  All intracranial imaging will be separately interpreted by the neuroradiology.  The filter was then retrieved.  The patient remains neurologically intact.  There were no immediate complications.  Impression:    #1:  successful stenting of a recurrent proximal patch left carotid stenosis    V. Annamarie Major, M.D. [facility] [place] Office: [phone] Pager:  [phone]

## 2014-10-14 NOTE — Progress Notes (Signed)
BEDREST IS FINISHED AND IV IS  SALINE LOC'D

## 2014-10-14 NOTE — Progress Notes (Signed)
Visitors in to see. No neuro deficits.

## 2014-10-14 NOTE — Progress Notes (Signed)
Site area: RFA Site Prior to Removal:  Level 0 Pressure Applied For:30 min Manual:   yes Patient Status During Pull:  stable Post Pull Site:  Level 0 Post Pull Instructions Given:  yes Post Pull Pulses Present: palpable Dressing Applied:  clear Bedrest begins @ 1115 Comments:

## 2014-10-15 ENCOUNTER — Telehealth: Payer: Self-pay | Admitting: Surgery

## 2014-10-15 MED FILL — Sodium Chloride IV Soln 0.9%: INTRAVENOUS | Qty: 50 | Status: AC

## 2014-10-15 NOTE — Progress Notes (Signed)
Pt to d/c home. Reviewed instructions, VSS.

## 2014-10-15 NOTE — Telephone Encounter (Signed)
-----   Message from Mena Goes, RN sent at 10/14/2014 10:15 AM EDT ----- Regarding: Schedule   ----- Message -----    From: Serafina Mitchell, MD    Sent: 10/14/2014   9:32 AM      To: [facility] Charge Pool  10/14/2014:  Procedure Performed:  1.  Ultrasound-guided access, right femoral artery  2.  Left carotid stenting with distal embolic protection   I will be the billing physician for all components of the procedure.   Schedule follow-up with me in one month with a carotid duplex

## 2014-10-15 NOTE — Progress Notes (Addendum)
       Mrs. Gloria Murray was admitted to [facility] to under go Carotid artery angiogram and stenting.  She can return to work full duty Monday 10/20/2014.  She will follow up with Dr. Rock Nephew Early in 2 weeks from surgery.   Myleah Cavendish MAUREEN PA-C

## 2014-10-15 NOTE — Care Management Note (Signed)
    Page 1 of 1   10/15/2014     11:45:13 AM CARE MANAGEMENT NOTE 10/15/2014  Patient:  Gloria Murray, Gloria Murray   Account Number:  [id number]  Date Initiated:  10/15/2014  Documentation initiated by:  COLE,ANGELA  Subjective/Objective Assessment:   PTA from home admitted with carotid stenosis. S/P (l) carotid artery stenting.     Action/Plan:   Return to home when medically stable.   Anticipated DC Date:  10/15/2014   Anticipated DC Plan:  [place]  CM consult      Choice offered to / List presented to:             Status of service:  Completed, signed off Medicare Important Message given?  NA - LOS <3 / Initial given by admissions (If response is "NO", the following Medicare IM given date fields will be blank) Date Medicare IM given:   Medicare IM given by:   Date Additional Medicare IM given:   Additional Medicare IM given by:    Discharge Disposition:  HOME/SELF CARE  Per UR Regulation:  Reviewed for med. necessity/level of care/duration of stay  If discussed at [place] of Stay Meetings, dates discussed:    Comments:

## 2014-10-15 NOTE — Telephone Encounter (Signed)
LM for pt re appt, dpm °

## 2014-10-16 ENCOUNTER — Telehealth: Payer: Self-pay | Admitting: Vascular Surgery

## 2014-10-16 NOTE — Telephone Encounter (Signed)
LM for pt with new MD time for TFE- not VWB, dpm

## 2014-10-16 NOTE — Telephone Encounter (Signed)
-----   Message from Mena Goes, RN sent at 10/15/2014 10:48 AM EDT ----- Regarding: Schedule   ----- Message -----    From: Ulyses Amor, PA-C    Sent: 10/15/2014  10:47 AM      To: Vvs Charge Pool  Sorry she needs to f/u with Dr. Donnetta Hutching in 2 weeks not Dr. Trula Slade thx [facility]

## 2014-10-17 NOTE — Discharge Summary (Signed)
[facility] Discharge Summary   Patient ID:  Gloria Murray MRN: [id number] DOB/AGE: 66-Dec-1952 66 y.o.  Admit date: 10/14/2014 Discharge date: 10/17/2014 Date of Surgery: 10/14/2014 Surgeon: Surgeon(s): Serafina Mitchell, MD Lorretta Harp, MD  Admission Diagnosis: carotid stenosis  Discharge Diagnoses:  carotid stenosis  Secondary Diagnoses: Past Medical History  Diagnosis Date  . Hypertension   . Hyperlipidemia   . Osteoporosis   . Thyroid disease   . Carotid artery occlusion   . Allergy     Procedure(s): CAROTID STENT INSERTION  Discharged Condition: good  HPI: This is a patient of Dr. early who is status post left carotid endarterectomy in May 2010. Ultrasound recently identified a velocity elevation. Dr. early performed carotid angiography which revealed a high-grade stenosis at the proximal patch. She was originally scheduled for left carotid stenting but had not started aspirin and Plavix regimen therefore procedure was canceled. She is here today for follow-up procedure 10/14/2014.   Hospital Course:  Gloria Murray is a 64 y.o. female is S/P Left Procedure(s): CAROTID STENT INSERTION Her stay was uneventful she was taking po's well and ambulating.  Discharge home in stable condition.  Consults:     Significant Diagnostic Studies: CBC Lab Results  Component Value Date   WBC 5.7 05/09/2014   HGB 16.3* 10/14/2014   HCT 48.0* 10/14/2014   MCV 91.6 05/09/2014   PLT 275.0 05/09/2014    BMET    Component Value Date/Time   NA 142 10/14/2014 0612   K 3.9 10/14/2014 0612   CL 104 10/14/2014 0612   CO2 27 05/09/2014 0915   GLUCOSE 112* 10/14/2014 0612   BUN 17 10/14/2014 0612   CREATININE 0.80 10/14/2014 0612   CALCIUM 9.6 05/09/2014 0915   GFRNONAA 77.65 10/12/2009 0904   GFRAA  12/12/2008 0440    >60        The eGFR has been calculated using the MDRD equation. This calculation has not been validated in all  clinical situations. eGFR's persistently <60 mL/min signify possible Chronic Kidney Disease.   COAG Lab Results  Component Value Date   INR 0.9 12/10/2008     Disposition:  Discharge to :Home Discharge Instructions    Call MD for:  redness, tenderness, or signs of infection (pain, swelling, bleeding, redness, odor or green/yellow discharge around incision site)    Complete by:  As directed      Call MD for:  severe or increased pain, loss or decreased feeling  in affected limb(s)    Complete by:  As directed      Call MD for:  temperature >100.5    Complete by:  As directed      Discharge instructions    Complete by:  As directed   You may shower     Increase activity slowly    Complete by:  As directed   Walk with assistance use walker or cane as needed     Resume previous diet    Complete by:  As directed             Medication List    TAKE these medications        Ascorbic Acid Gran  Take 1 tablet by mouth daily.     aspirin 81 MG tablet  Take 81 mg by mouth daily.     atenolol 50 MG tablet  Commonly known as:  TENORMIN  Take one by mouth daily.     Calcium-Vitamin D 600-200 MG-UNIT  Caps  Take 1 tablet by mouth 2 (two) times daily.     clopidogrel 75 MG tablet  Commonly known as:  PLAVIX  Take 1 tablet (75 mg total) by mouth daily.     Fish Oil 1000 MG Caps  Take 1 capsule (1,000 mg total) by mouth daily.     Garlic 7078 MG Caps  Take 1 capsule by mouth 2 (two) times daily.     hydrochlorothiazide 12.5 MG capsule  Commonly known as:  MICROZIDE  Take 1 capsule (12.5 mg total) by mouth daily.     levothyroxine 112 MCG tablet  Commonly known as:  SYNTHROID, LEVOTHROID  Take one by mouth daily     loratadine 10 MG tablet  Commonly known as:  CLARITIN  Take 10 mg by mouth as needed for allergies.     simvastatin 40 MG tablet  Commonly known as:  ZOCOR  Take one by mouth at bedtime.       Verbal and written Discharge instructions given to  the patient. Wound care per Discharge AVS F/U with Dr. Donnetta Hutching in 2 weeks  Signed: Laurence Slate [facility] 10/17/2014, 11:18 AM

## 2014-10-28 ENCOUNTER — Other Ambulatory Visit: Payer: Self-pay | Admitting: Family Medicine

## 2014-10-28 ENCOUNTER — Encounter: Payer: Commercial Managed Care - HMO | Admitting: Vascular Surgery

## 2014-11-13 ENCOUNTER — Other Ambulatory Visit: Payer: Self-pay | Admitting: Surgery

## 2014-11-13 ENCOUNTER — Ambulatory Visit (HOSPITAL_COMMUNITY)
Admit: 2014-11-13 | Discharge: 2014-11-13 | Disposition: A | Discharge status: Home / Self Care | Payer: Commercial Managed Care - HMO | Source: Ambulatory Visit | Attending: Vascular Surgery | Admitting: Vascular Surgery

## 2014-11-13 DIAGNOSIS — Z48812 Encounter for surgical aftercare following surgery on the circulatory system: Secondary | ICD-10-CM

## 2014-11-13 DIAGNOSIS — I6523 Occlusion and stenosis of bilateral carotid arteries: Secondary | ICD-10-CM

## 2014-11-13 DIAGNOSIS — Z959 Presence of cardiac and vascular implant and graft, unspecified: Secondary | ICD-10-CM

## 2014-11-17 ENCOUNTER — Encounter: Payer: Self-pay | Admitting: Vascular Surgery

## 2014-11-17 ENCOUNTER — Ambulatory Visit: Payer: Commercial Managed Care - HMO | Admitting: Surgery

## 2014-11-18 ENCOUNTER — Encounter: Payer: Self-pay | Admitting: Vascular Surgery

## 2014-11-18 ENCOUNTER — Ambulatory Visit (INDEPENDENT_AMBULATORY_CARE_PROVIDER_SITE_OTHER): Payer: Self-pay | Admitting: Vascular Surgery

## 2014-11-18 VITALS — BP 124/60 | HR 88 | Resp 18 | Ht 67.0 in | Wt 212.4 lb

## 2014-11-18 DIAGNOSIS — I6522 Occlusion and stenosis of left carotid artery: Secondary | ICD-10-CM

## 2014-11-18 DIAGNOSIS — I6523 Occlusion and stenosis of bilateral carotid arteries: Secondary | ICD-10-CM

## 2014-11-18 MED ORDER — CLOPIDOGREL BISULFATE 75 MG PO TABS: 75.0000 mg | ORAL_TABLET | Freq: Every day | ORAL | Status: DC

## 2014-11-18 NOTE — Progress Notes (Signed)
[facility] for Plavix 75 mg with 3 refills, called pt and offered to fax local pharmacy for a 30 day supply until mail order ships, pt declined this offer

## 2014-11-18 NOTE — Progress Notes (Signed)
Here today for follow-up of extracranial cerebrovascular occlusive disease. She is status post left carotid endarterectomy by myself in May 2010. She was found to have high-grade distal common carotid artery restenosis. She underwent uneventful stenting by Dr. Trula Slade on 07/23/2014 and is done well since this. She has had no neurologic deficits.  Most importantly she quit smoking following the stenting has not resumed this.  Past Medical History  Diagnosis Date  . Hypertension   . Hyperlipidemia   . Osteoporosis   . Thyroid disease   . Carotid artery occlusion   . Allergy     History  Substance Use Topics  . Smoking status: Former Smoker -- 0.50 packs/day    Types: Cigarettes, E-cigarettes    Quit date: 07/23/2014  . Smokeless tobacco: Never Used  . Alcohol Use: No    Family History  Problem Relation Age of Onset  . Diabetes Mother   . Colon cancer Neg Hx     Allergies  Allergen Reactions  . Propoxyphene Hcl Itching and Nausea Only    REACTION: nausea and itch  . Propoxyphene N-Acetaminophen Itching and Nausea Only    REACTION: nausea and  itch     Current outpatient prescriptions:  .  Ascorbic Acid GRAN, Take 1 tablet by mouth daily.  , Disp: , Rfl:  .  aspirin 81 MG tablet, Take 81 mg by mouth daily.  , Disp: , Rfl:  .  atenolol (TENORMIN) 50 MG tablet, TAKE 1 TABLET EVERY DAY, Disp: 90 tablet, Rfl: 1 .  Calcium Carbonate-Vitamin D (CALCIUM-VITAMIN D) 600-200 MG-UNIT CAPS, Take 1 tablet by mouth 2 (two) times daily.  , Disp: , Rfl:  .  Garlic 9767 MG CAPS, Take 1 capsule by mouth 2 (two) times daily.  , Disp: , Rfl:  .  hydrochlorothiazide (MICROZIDE) 12.5 MG capsule, TAKE 1 CAPSULE EVERY DAY, Disp: 90 capsule, Rfl: 1 .  levothyroxine (SYNTHROID, LEVOTHROID) 112 MCG tablet, TAKE 1 TABLET EVERY DAY, Disp: 90 tablet, Rfl: 1 .  loratadine (CLARITIN) 10 MG tablet, Take 10 mg by mouth as needed for allergies. , Disp: , Rfl:  .  Omega-3 Fatty Acids (FISH OIL) 1000 MG CAPS,  Take 1 capsule (1,000 mg total) by mouth daily., Disp: , Rfl:  .  simvastatin (ZOCOR) 40 MG tablet, Take one by mouth at bedtime., Disp: 90 tablet, Rfl: 3 .  clopidogrel (PLAVIX) 75 MG tablet, Take 1 tablet (75 mg total) by mouth daily. (Patient not taking: Reported on 11/18/2014), Disp: 30 tablet, Rfl: 1  Filed Vitals:   11/18/14 0950  BP: 124/60  Pulse: 88  Resp: 18  Height: 5\' 7"  (1.702 m)  Weight: 212 lb 6.4 oz (96.344 kg)    Body mass index is 33.26 kg/(m^2).       Physical exam is well-developed well-nourished white female no acute distress. Right arteries without bruits bilaterally with a well-healed left neck incision 2+ radial pulses bilaterally Grossly intact neurologically.  Carotid duplex today was reviewed with the patient. This shows no evidence of flow limitation in her left carotid stented area. Also no change in her moderate 40-50% right carotid stenosis  Stable status post carotid stenting of recurrent stenosis. Will continue usual activities. Again was congratulated on her smoking cessation. We'll see [place] in 6 months with repeat carotid duplex. Assuming this is normal we'll drop back to yearly intervals. Will continue on Plavix long-term.

## 2014-11-18 NOTE — Addendum Note (Signed)
Addended by: Reola Calkins on: 11/18/2014 03:28 PM   Modules accepted: Orders

## 2014-11-24 NOTE — Addendum Note (Signed)
Addended by: Mena Goes on: 11/24/2014 04:43 PM   Modules accepted: Orders

## 2015-05-09 ENCOUNTER — Other Ambulatory Visit: Payer: Self-pay | Admitting: Family Medicine

## 2015-05-11 NOTE — Telephone Encounter (Signed)
Lm on pts vm informing her OV required for additional refills. Last OV 05/2014

## 2015-05-14 ENCOUNTER — Other Ambulatory Visit: Payer: Self-pay

## 2015-05-14 DIAGNOSIS — Z1231 Encounter for screening mammogram for malignant neoplasm of breast: Secondary | ICD-10-CM

## 2015-05-28 ENCOUNTER — Encounter: Payer: Self-pay | Admitting: Family

## 2015-05-29 ENCOUNTER — Ambulatory Visit
Admission: RE | Admit: 2015-05-29 | Discharge: 2015-05-29 | Disposition: A | Discharge status: Home / Self Care | Payer: Commercial Managed Care - HMO | Source: Ambulatory Visit

## 2015-05-29 DIAGNOSIS — Z1231 Encounter for screening mammogram for malignant neoplasm of breast: Secondary | ICD-10-CM | POA: Diagnosis not present

## 2015-06-02 ENCOUNTER — Ambulatory Visit: Payer: Commercial Managed Care - HMO | Admitting: Family

## 2015-06-02 ENCOUNTER — Encounter: Payer: Self-pay | Admitting: Family

## 2015-06-02 ENCOUNTER — Ambulatory Visit (INDEPENDENT_AMBULATORY_CARE_PROVIDER_SITE_OTHER): Payer: Commercial Managed Care - HMO | Admitting: Family

## 2015-06-02 ENCOUNTER — Other Ambulatory Visit: Payer: Self-pay | Admitting: Vascular Surgery

## 2015-06-02 ENCOUNTER — Ambulatory Visit (HOSPITAL_COMMUNITY)
Admission: RE | Admit: 2015-06-02 | Discharge: 2015-06-02 | Disposition: A | Discharge status: Home / Self Care | Payer: Commercial Managed Care - HMO | Source: Ambulatory Visit | Attending: Family | Admitting: Family

## 2015-06-02 VITALS — BP 99/72 | HR 74 | Temp 97.9°F | Resp 16 | Ht 66.5 in | Wt 199.0 lb

## 2015-06-02 DIAGNOSIS — I6523 Occlusion and stenosis of bilateral carotid arteries: Secondary | ICD-10-CM | POA: Diagnosis not present

## 2015-06-02 DIAGNOSIS — Z48812 Encounter for surgical aftercare following surgery on the circulatory system: Secondary | ICD-10-CM

## 2015-06-02 DIAGNOSIS — E785 Hyperlipidemia, unspecified: Secondary | ICD-10-CM | POA: Insufficient documentation

## 2015-06-02 DIAGNOSIS — I708 Atherosclerosis of other arteries: Secondary | ICD-10-CM

## 2015-06-02 DIAGNOSIS — Z959 Presence of cardiac and vascular implant and graft, unspecified: Secondary | ICD-10-CM | POA: Diagnosis not present

## 2015-06-02 DIAGNOSIS — Z9889 Other specified postprocedural states: Secondary | ICD-10-CM | POA: Diagnosis not present

## 2015-06-02 DIAGNOSIS — I771 Stricture of artery: Secondary | ICD-10-CM

## 2015-06-02 DIAGNOSIS — I6522 Occlusion and stenosis of left carotid artery: Secondary | ICD-10-CM

## 2015-06-02 DIAGNOSIS — I10 Essential (primary) hypertension: Secondary | ICD-10-CM | POA: Insufficient documentation

## 2015-06-02 DIAGNOSIS — Z87891 Personal history of nicotine dependence: Secondary | ICD-10-CM

## 2015-06-02 NOTE — Patient Instructions (Signed)
Stroke Prevention Some medical conditions and behaviors are associated with an increased chance of having a stroke. You may prevent a stroke by making healthy choices and managing medical conditions. HOW CAN I REDUCE MY RISK OF HAVING A STROKE?   Stay physically active. Get at least 30 minutes of activity on most or all days.  Do not smoke. It may also be helpful to avoid exposure to secondhand smoke.  Limit alcohol use. Moderate alcohol use is considered to be:  No more than 2 drinks per day for men.  No more than 1 drink per day for nonpregnant women.  Eat healthy foods. This involves:  Eating 5 or more servings of fruits and vegetables a day.  Making dietary changes that address high blood pressure (hypertension), high cholesterol, diabetes, or obesity.  Manage your cholesterol levels.  Making food choices that are high in fiber and low in saturated fat, trans fat, and cholesterol may control cholesterol levels.  Take any prescribed medicines to control cholesterol as directed by your health care provider.  Manage your diabetes.  Controlling your carbohydrate and sugar intake is recommended to manage diabetes.  Take any prescribed medicines to control diabetes as directed by your health care provider.  Control your hypertension.  Making food choices that are low in salt (sodium), saturated fat, trans fat, and cholesterol is recommended to manage hypertension.  Ask your health care provider if you need treatment to lower your blood pressure. Take any prescribed medicines to control hypertension as directed by your health care provider.  If you are 18-39 years of age, have your blood pressure checked every 3-5 years. If you are 40 years of age or older, have your blood pressure checked every year.  Maintain a healthy weight.  Reducing calorie intake and making food choices that are low in sodium, saturated fat, trans fat, and cholesterol are recommended to manage  weight.  Stop drug abuse.  Avoid taking birth control pills.  Talk to your health care provider about the risks of taking birth control pills if you are over 35 years old, smoke, get migraines, or have ever had a blood clot.  Get evaluated for sleep disorders (sleep apnea).  Talk to your health care provider about getting a sleep evaluation if you snore a lot or have excessive sleepiness.  Take medicines only as directed by your health care provider.  For some people, aspirin or blood thinners (anticoagulants) are helpful in reducing the risk of forming abnormal blood clots that can lead to stroke. If you have the irregular heart rhythm of atrial fibrillation, you should be on a blood thinner unless there is a good reason you cannot take them.  Understand all your medicine instructions.  Make sure that other conditions (such as anemia or atherosclerosis) are addressed. SEEK IMMEDIATE MEDICAL CARE IF:   You have sudden weakness or numbness of the face, arm, or leg, especially on one side of the body.  Your face or eyelid droops to one side.  You have sudden confusion.  You have trouble speaking (aphasia) or understanding.  You have sudden trouble seeing in one or both eyes.  You have sudden trouble walking.  You have dizziness.  You have a loss of balance or coordination.  You have a sudden, severe headache with no known cause.  You have new chest pain or an irregular heartbeat. Any of these symptoms may represent a serious problem that is an emergency. Do not wait to see if the symptoms will   go away. Get medical help at once. Call your local emergency services (911 in [place] Do not drive yourself to the hospital.   This information is not intended to replace advice given to you by your health care provider. Make sure you discuss any questions you have with your health care provider.   Document Released: 08/25/2004 Document Revised: 08/08/2014 Document Reviewed:  01/18/2013 Elsevier Interactive Patient Education 2016 [employer]  

## 2015-06-02 NOTE — Progress Notes (Signed)
Established Carotid Patient   History of Present Illness  Gloria Murray is a 64 y.o. female patient of Dr. Donnetta Hutching who returns today for follow-up of extracranial cerebrovascular occlusive disease. She is status post left carotid endarterectomy in May 2010. She was found to have high-grade distal common carotid artery restenosis. She underwent uneventful left carotid stenting by Dr. Trula Slade on 10/14/14 and is done well since this. She has had no neurologic deficits.  Most importantly she quit smoking following the stenting has not resumed this.  The patient has no history of TIA or stroke symptoms.Specifically the patient denies a history of amaurosis fugax or monocular blindness, unilateral facial drooping, hemiplegia, or receptive or expressive aphasia.  She denies steal symptoms in either arm, denies dizziness.  She does lots of walking on her job.  Pt reports New Medical or Surgical History: none.  August 02, 2013 colonoscopy, normal per pt, normal mammogram She denies claudication symptoms with walking, denies non healing wounds, denies any cardiac problems.  Pt Diabetic: No Pt smoker: former smoker, started at age 60 yrs, quit  In March 2016  Pt meds include: Statin : Yes ASA: no Other anticoagulants/antiplatelets: Plavix since the carotid stent placed March 2016   Past Medical History  Diagnosis Date  . Hypertension   . Hyperlipidemia   . Osteoporosis   . Thyroid disease   . Carotid artery occlusion   . Allergy     Social History Social History  Substance Use Topics  . Smoking status: Former Smoker -- 0.50 packs/day    Types: Cigarettes, E-cigarettes    Quit date: 07/23/2014  . Smokeless tobacco: Never Used  . Alcohol Use: No    Family History Family History  Problem Relation Age of Onset  . Diabetes Mother   . Colon cancer Neg Hx     Surgical History Past Surgical History  Procedure Laterality Date  . Thyroidectomy    . Tubal ligation  1975   bilateral  . Dilation and curettage of uterus      X 2  . Carpal tunnel release      X 2, right wrist  . Carotid endarterectomy  12/11/2008    left  . Laceration hand Right     hand  . Colonoscopy  Jan. 2, 2015  . Carotid angiogram N/A 07/23/2014    Procedure: CAROTID ANGIOGRAM;  Surgeon: Rosetta Posner, MD;  Location: [facility] CATH LAB;  Service: Cardiovascular;  Laterality: N/A;  . Arch aortogram N/A 07/23/2014    Procedure: ARCH AORTOGRAM;  Surgeon: Rosetta Posner, MD;  Location: [facility] CATH LAB;  Service: Cardiovascular;  Laterality: N/A;  . Carotid stent insertion Left 10/14/2014    Procedure: CAROTID STENT INSERTION;  Surgeon: Serafina Mitchell, MD;  Location: [facility] CATH LAB;  Service: Cardiovascular;  Laterality: Left;   carotid    Allergies  Allergen Reactions  . Propoxyphene Hcl Itching and Nausea Only    REACTION: nausea and itch  . Propoxyphene N-Acetaminophen Itching and Nausea Only    REACTION: nausea and  itch    Current Outpatient Prescriptions  Medication Sig Dispense Refill  . Ascorbic Acid GRAN Take 1 tablet by mouth daily.      Marland Kitchen aspirin 81 MG tablet Take 81 mg by mouth daily.      Marland Kitchen atenolol (TENORMIN) 50 MG tablet TAKE 1 TABLET EVERY DAY 30 tablet 0  . Calcium Carbonate-Vitamin D (CALCIUM-VITAMIN D) 600-200 MG-UNIT CAPS Take 1 tablet by mouth 2 (two) times daily.      Marland Kitchen  clopidogrel (PLAVIX) 75 MG tablet Take 1 tablet (75 mg total) by mouth daily. 90 tablet 3  . Garlic 3825 MG CAPS Take 1 capsule by mouth 2 (two) times daily.      . hydrochlorothiazide (MICROZIDE) 12.5 MG capsule TAKE 1 CAPSULE EVERY DAY 30 capsule 0  . levothyroxine (SYNTHROID, LEVOTHROID) 112 MCG tablet TAKE 1 TABLET EVERY DAY 30 tablet 0  . loratadine (CLARITIN) 10 MG tablet Take 10 mg by mouth as needed for allergies.     . Omega-3 Fatty Acids (FISH OIL) 1000 MG CAPS Take 1 capsule (1,000 mg total) by mouth daily.    . simvastatin (ZOCOR) 40 MG tablet TAKE 1 TABLET AT BEDTIME 30 tablet 0   No current  facility-administered medications for this visit.    Review of Systems : See HPI for pertinent positives and negatives.  Physical Examination  Filed Vitals:   06/02/15 1009 06/02/15 1015  BP: 98/76 99/72  Pulse: 73 74  Temp:  97.9 F (36.6 C)  TempSrc:  Oral  Resp:  16  Height:  5' 6.5" (1.689 m)  Weight:  199 lb (90.266 kg)  SpO2:  98%   Body mass index is 31.64 kg/(m^2).  General: WDWN obese female in NAD GAIT: normal Eyes: PERRLA Pulmonary: Non-labored, CTAB, no rales, no rhonchi, & no wheezing.  Cardiac: regular rhythm, positive murmur.  VASCULAR EXAM Carotid Bruits Left Right   negative Positive   Aorta is not palpable. Radial pulses are 2+ palpable and equal.     Gastrointestinal: soft, nontender, BS WNL, no r/g,no palpable masses.  Musculoskeletal: Negative muscle atrophy/wasting. M/S 5/5 throughout, extremities without ischemic changes.  Neurologic: A&O X 3; Appropriate Affect, normal sensation, Speech is normal CN 2-12 intact, Pain and light touch intact in extremities, Motor exam as listed above.         Non-Invasive Vascular Imaging CAROTID DUPLEX 06/02/2015   CEREBROVASCULAR DUPLEX EVALUATION     INDICATION: Carotid stenosis    PREVIOUS INTERVENTION(S): Left mid to distal common carotid artery stent with extension into the bifurcation performed March 2016; Left carotid endarterectomy 12/11/2008.    DUPLEX EXAM:     RIGHT  LEFT  Peak Systolic Velocities (cm/s) End Diastolic Velocities (cm/s) Plaque LOCATION Peak Systolic Velocities (cm/s) End Diastolic Velocities (cm/s) Plaque  92 17  CCA PROXIMAL 66 17   76 18  CCA MID 81 22   58 18 CP CCA DISTAL 51 11   134 12 CP ECA 78 9   141 51 CP ICA PROXIMAL 77 25   86 27  ICA MID 82 33   62 18  ICA DISTAL 101 38      ICA / CCA Ratio (PSV) carotid  endarterectomy/stent  Bidirectional Vertebral Flow Antegrade  053 Brachial Systolic Pressure (mmHg) 976  Monophasic Brachial Artery Waveforms triphasic    Peak Systolic Velocities (cm/s) End Diastolic Velocities (cm/s) Plaque STENT (Left CCA 73/41/9379) Peak Systolic Velocities (cm/s) End Diastolic Velocities (cm/s) Plaque     PROXIMAL 89 25      MID 70 17      DISTAL 71 20     Plaque Morphology:  HM = Homogeneous, HT = Heterogeneous, CP = Calcific Plaque, SP = Smooth Plaque, IP = Irregular Plaque    ADDITIONAL FINDINGS: Right subclavian artery PSV456cm/sec; Left subclavian artery PSV112cm/sec     IMPRESSION: Elevated velocity present involving the right subclavian artery at origin suggestive of 75%-99% stenosis. Blood pressure gradient with bidirectional flow present involving the right  vertebral artery present. Right internal carotid artery stenosis present in the 40%-59% range. Left common carotid artery is patent with stent present, no hyperplasia or hemodynamically significant changes present. Left internal carotid artery is patent, less than 40% stenosis.    Compared to the previous exam:  Essentially unchanged since previous study on 11/24/2014.      Assessment: Gloria Murray is a 64 y.o. female who is status post left carotid endarterectomy in May 2010. She was found to have high-grade distal common carotid artery restenosis. She underwent uneventful left carotid stenting by Dr. Trula Slade on 10/14/14 and is done well since this. She has had no neurologic deficits. Most importantly she quit smoking following the stenting has not resumed this. She has no history of stroke or TIA.  Today's carotid duplex suggests 40%-59% right internal carotid artery stenosis. Left common carotid artery is patent with stent present, no hyperplasia or hemodynamically significant changes present. Left internal carotid artery is patent, less than 40% stenosis. Essentially unchanged since previous study  on 11/24/2014.   Dr. Luther Parody April 2016 note: Stable status post carotid stenting of recurrent stenosis. Will continue usual activities. Again was congratulated on her smoking cessation. We'll see her in 6 months with repeat carotid duplex. Assuming this is normal we'll drop back to yearly intervals. Will continue on Plavix long-term.  I discussed with Dr. Donnetta Hutching pt's asymptomatic 75-99% right subclavian stenosis. Since she is asymptomatic there is no need to address.    Plan: Follow-up in 1 year with Carotid Duplex.   I discussed in depth with the patient the nature of atherosclerosis, and emphasized the importance of maximal medical management including strict control of blood pressure, blood glucose, and lipid levels, obtaining regular exercise, and continued cessation of smoking.  The patient is aware that without maximal medical management the underlying atherosclerotic disease process will progress, limiting the benefit of any interventions. The patient was given information about stroke prevention and what symptoms should prompt the patient to seek immediate medical care. Thank you for allowing [place] to participate in this patient's care.  Clemon Chambers, RN, MSN, FNP-C [facility] Office: [phone]  Clinic Physician: Early  06/02/2015 10:24 AM

## 2015-06-30 ENCOUNTER — Encounter: Payer: Self-pay | Admitting: Family Medicine

## 2015-06-30 ENCOUNTER — Ambulatory Visit (INDEPENDENT_AMBULATORY_CARE_PROVIDER_SITE_OTHER): Payer: Commercial Managed Care - HMO | Admitting: Family Medicine

## 2015-06-30 VITALS — BP 132/72 | HR 83 | Temp 98.1°F | Ht 66.0 in | Wt 199.0 lb

## 2015-06-30 DIAGNOSIS — M899 Disorder of bone, unspecified: Secondary | ICD-10-CM

## 2015-06-30 DIAGNOSIS — Z0000 Encounter for general adult medical examination without abnormal findings: Secondary | ICD-10-CM

## 2015-06-30 DIAGNOSIS — E782 Mixed hyperlipidemia: Secondary | ICD-10-CM | POA: Diagnosis not present

## 2015-06-30 DIAGNOSIS — E039 Hypothyroidism, unspecified: Secondary | ICD-10-CM

## 2015-06-30 DIAGNOSIS — F17200 Nicotine dependence, unspecified, uncomplicated: Secondary | ICD-10-CM

## 2015-06-30 DIAGNOSIS — I10 Essential (primary) hypertension: Secondary | ICD-10-CM | POA: Diagnosis not present

## 2015-06-30 DIAGNOSIS — I6529 Occlusion and stenosis of unspecified carotid artery: Secondary | ICD-10-CM

## 2015-06-30 DIAGNOSIS — M949 Disorder of cartilage, unspecified: Secondary | ICD-10-CM

## 2015-06-30 DIAGNOSIS — Z23 Encounter for immunization: Secondary | ICD-10-CM | POA: Diagnosis not present

## 2015-06-30 LAB — CBC WITH DIFFERENTIAL/PLATELET
Basophils Absolute: 0 10*3/uL (ref 0.0–0.1)
Basophils Relative: 0.7 % (ref 0.0–3.0)
EOS ABS: 0.1 10*3/uL (ref 0.0–0.7)
Eosinophils Relative: 2.5 % (ref 0.0–5.0)
HCT: 45.4 % (ref 36.0–46.0)
HEMOGLOBIN: 15 g/dL (ref 12.0–15.0)
LYMPHS ABS: 1 10*3/uL (ref 0.7–4.0)
Lymphocytes Relative: 25.4 % (ref 12.0–46.0)
MCHC: 33 g/dL (ref 30.0–36.0)
MCV: 91.8 fl (ref 78.0–100.0)
MONO ABS: 0.3 10*3/uL (ref 0.1–1.0)
Monocytes Relative: 8.4 % (ref 3.0–12.0)
NEUTROS PCT: 63 % (ref 43.0–77.0)
Neutro Abs: 2.6 10*3/uL (ref 1.4–7.7)
Platelets: 286 10*3/uL (ref 150.0–400.0)
RBC: 4.95 Mil/uL (ref 3.87–5.11)
RDW: 12.9 % (ref 11.5–15.5)
WBC: 4.1 10*3/uL (ref 4.0–10.5)

## 2015-06-30 LAB — COMPREHENSIVE METABOLIC PANEL
ALBUMIN: 4.2 g/dL (ref 3.5–5.2)
ALK PHOS: 58 U/L (ref 39–117)
ALT: 18 U/L (ref 0–35)
AST: 18 U/L (ref 0–37)
BUN: 19 mg/dL (ref 6–23)
CHLORIDE: 105 meq/L (ref 96–112)
CO2: 31 mEq/L (ref 19–32)
Calcium: 9.6 mg/dL (ref 8.4–10.5)
Creatinine, Ser: 0.73 mg/dL (ref 0.40–1.20)
GFR: 84.74 mL/min (ref >60.00)
GLUCOSE: 107 mg/dL — AB (ref 70–99)
Potassium: 3.7 mEq/L (ref 3.5–5.1)
SODIUM: 142 meq/L (ref 135–145)
TOTAL PROTEIN: 6.9 g/dL (ref 6.0–8.3)
Total Bilirubin: 0.5 mg/dL (ref 0.2–1.2)

## 2015-06-30 LAB — T4, FREE: Free T4: 1.25 ng/dL (ref 0.60–1.60)

## 2015-06-30 LAB — LIPID PANEL
CHOLESTEROL: 157 mg/dL (ref 0–200)
HDL: 37.5 mg/dL — AB (ref >39.00)
LDL Cholesterol: 96 mg/dL (ref 0–99)
NonHDL: 119.84
TRIGLYCERIDES: 119 mg/dL (ref 0.0–149.0)
Total CHOL/HDL Ratio: 4
VLDL: 23.8 mg/dL (ref 0.0–40.0)

## 2015-06-30 LAB — TSH: TSH: 1.64 u[IU]/mL (ref 0.35–4.50)

## 2015-06-30 MED ORDER — LEVOTHYROXINE SODIUM 112 MCG PO TABS: 112.0000 ug | ORAL_TABLET | Freq: Every day | ORAL | Status: DC

## 2015-06-30 MED ORDER — ATENOLOL 50 MG PO TABS: 50.0000 mg | ORAL_TABLET | Freq: Every day | ORAL | Status: DC

## 2015-06-30 MED ORDER — SIMVASTATIN 40 MG PO TABS: 40.0000 mg | ORAL_TABLET | Freq: Every day | ORAL | Status: DC

## 2015-06-30 NOTE — Progress Notes (Signed)
64 yo pleasant female with h/o HLD, carotid stenosis, HTN, tobacco abuse, here for annual wellness visit and follow up of chronic medical conditions.  I have personally reviewed the Medicare Annual Wellness questionnaire and have noted 1. The patient's medical and social history 2. Their use of alcohol, tobacco or illicit drugs 3. Their current medications and supplements 4. The patient's functional ability including ADL's, fall risks, home safety risks and hearing or visual             impairment. 5. Diet and physical activities 6. Evidence for depression or mood disorders  End of life wishes discussed and updated in Social History.  The roster of all physicians providing medical care to patient - is listed in the CareTeams section of the chart.   Colonoscopy 1/2/15Henrene Pastor- recall in 5 years. Mammogram 05/29/15  Td 03/07/07 Pneumovax 05/19/14 Pap smear 05/17/13 (done by me).   No h/o abnormal pap smears. LMP 18 years ago.  No h/o post menopausal bleeding.    HLD- on Zocor 80 mg daily.  s/p left CEA and stent placement 07/2009 (Dr. Donnetta Hutching) and stenting by Dr. Trula Slade on 10/14/14. She also quit smoking following the stenting!  She has had no neurological deficits and doing well.  Last saw Clemon Chambers, NP on 06/02/15- note reviewed. Plan is 1 year follow up with Carotid Duplex.    Lab Results  Component Value Date   CHOL 131 05/09/2014   HDL 27.40* 05/09/2014   LDLCALC 69 05/09/2014   LDLDIRECT 95.9 05/17/2010   TRIG 171.0* 05/09/2014   CHOLHDL 5 05/09/2014    HTN- tolerating meds without side effects.  no HA or blurred vision.   Lab Results  Component Value Date   CREATININE 0.80 10/14/2014      Hypothyroidism-  Levothyroid 112 micrograms daily.  Denies any symptoms of hypo or hyperthyroidism. Lab Results  Component Value Date   TSH 0.63 05/09/2014    The PMH, PSH, Social History, Family History, Medications, and allergies have been reviewed in [facility], and have been  updated if relevant.  Review of Systems  Constitutional: Negative.   HENT: Negative.   Eyes: Negative.   Respiratory: Negative.   Cardiovascular: Negative.   Gastrointestinal: Negative.   Endocrine: Negative.   Genitourinary: Negative.   Musculoskeletal: Negative.   Skin: Negative.   Allergic/Immunologic: Negative.   Neurological: Negative.   Hematological: Negative.   Psychiatric/Behavioral: Negative.   All other systems reviewed and are negative.   Physical exam: BP 132/72 mmHg  Pulse 83  Temp(Src) 98.1 F (36.7 C) (Oral)  Ht 5\' 6"  (1.676 m)  Wt 199 lb (90.266 kg)  BMI 32.13 kg/m2  SpO2 98%   General:  Well-developed,well-nourished,in no acute distress; alert,appropriate and cooperative throughout examination Head:  normocephalic and atraumatic. Eyes:  vision grossly intact, pupils equal, pupils round, and pupils reactive to light.   Ears:  R ear normal and L ear normal.   Nose:  no external deformity.   Mouth:  good dentition.   Neck:  No deformities, masses, or tenderness noted. Lungs:  Normal respiratory effort, chest expands symmetrically. Lungs are clear to auscultation, no crackles or wheezes. Heart:  Normal rate and regular rhythm. S1 and S2 normal without gallop, murmur, click, rub or other extra sounds. Abdomen:  Bowel sounds positive,abdomen soft and non-tender without masses, organomegaly or hernias noted. Msk:  No deformity or scoliosis noted of thoracic or lumbar spine.   Extremities:  No clubbing, cyanosis, edema, or deformity noted with  normal full range of motion of all joints.   Neurologic:  alert & oriented X3 and gait normal.   Skin:  Intact without suspicious lesions or rashes Cervical Nodes:  No lymphadenopathy noted Axillary Nodes:  No palpable lymphadenopathy Psych:  Cognition and judgment appear intact. Alert and cooperative with normal attention span and concentration. No apparent delusions, illusions, hallucinations

## 2015-06-30 NOTE — Assessment & Plan Note (Signed)
Congratulated her on quitting! 

## 2015-06-30 NOTE — Assessment & Plan Note (Signed)
Well controlled. No changes made today. 

## 2015-06-30 NOTE — Assessment & Plan Note (Signed)
Due for labs today. Continue current dose of statin. 

## 2015-06-30 NOTE — Patient Instructions (Signed)
Great to see you. Happy Holidays.  We will call you with your lab results. 

## 2015-06-30 NOTE — Progress Notes (Signed)
Pre visit review using our clinic review tool, if applicable. No additional management support is needed unless otherwise documented below in the visit note. 

## 2015-06-30 NOTE — Assessment & Plan Note (Addendum)
The patients weight, height, BMI and visual acuity have been recorded in the chart.  Cognitive function assessed.   I have made referrals, counseling and provided education to the patient based review of the above and I have provided the pt with a written personalized care plan for preventive services.  Prevnar 13 and influenza vaccines given today.  

## 2015-06-30 NOTE — Addendum Note (Signed)
Addended by: Modena Nunnery on: 06/30/2015 09:48 AM   Modules accepted: Orders

## 2015-06-30 NOTE — Assessment & Plan Note (Addendum)
Followed by vascular, will follow. On statin and plavix.

## 2015-06-30 NOTE — Assessment & Plan Note (Signed)
Due for labs. No changes made to rxs today. 

## 2015-07-01 ENCOUNTER — Encounter: Payer: Self-pay | Admitting: *Deleted

## 2015-07-01 LAB — HEPATITIS C ANTIBODY: HCV AB: NEGATIVE

## 2015-07-09 ENCOUNTER — Other Ambulatory Visit: Payer: Self-pay

## 2015-07-09 MED ORDER — HYDROCHLOROTHIAZIDE 12.5 MG PO CAPS: 12.5000 mg | ORAL_CAPSULE | Freq: Every day | ORAL | Status: DC

## 2015-07-09 NOTE — Telephone Encounter (Signed)
Pt request refill HCTZ to [facility]. Last annual 06/30/15. Advised pt done.

## 2015-09-18 ENCOUNTER — Other Ambulatory Visit: Payer: Self-pay | Admitting: *Deleted

## 2015-09-18 DIAGNOSIS — I6522 Occlusion and stenosis of left carotid artery: Secondary | ICD-10-CM

## 2015-09-18 DIAGNOSIS — I6523 Occlusion and stenosis of bilateral carotid arteries: Secondary | ICD-10-CM

## 2015-09-18 MED ORDER — CLOPIDOGREL BISULFATE 75 MG PO TABS: 75.0000 mg | ORAL_TABLET | Freq: Every day | ORAL | Status: DC

## 2016-04-21 ENCOUNTER — Telehealth: Payer: Self-pay

## 2016-04-21 MED ORDER — METOPROLOL SUCCINATE ER 25 MG PO TB24: 25.0000 mg | ORAL_TABLET | Freq: Every day | ORAL | 3 refills | Status: DC

## 2016-04-21 NOTE — Telephone Encounter (Signed)
eRx sent for metoprolol to [facility].  Please ask her to follow up next week to recheck her blood pressure.

## 2016-04-21 NOTE — Addendum Note (Signed)
Addended by: Lucille Passy on: 04/21/2016 04:13 PM   Modules accepted: Orders

## 2016-04-21 NOTE — Telephone Encounter (Signed)
Please ask her to call her insurance to find out which beta blocker they will cover.

## 2016-04-21 NOTE — Telephone Encounter (Signed)
Pt left v/m; pt gets med thru [employer] order. Pt cannot get atenolol refilled due to not being available; pt request cb with what to do about getting atenolol filled. Last seen annual exam 06/27/15. Pt request cb.

## 2016-04-21 NOTE — Telephone Encounter (Signed)
Lm on pts vm and advised per Dr Deborra Medina. Requests pt contact office back when she determines which medications are, in fact, in stock and covered

## 2016-04-21 NOTE — Telephone Encounter (Addendum)
Patient returned Gloria Murray's call and said insurance company told her that all of the beta blockers are covered.  Patient said she hasn't had any medication for 3 days.  Patient wants to know if Dr.Aron needs to see her to discuss medication.

## 2016-04-22 ENCOUNTER — Ambulatory Visit (INDEPENDENT_AMBULATORY_CARE_PROVIDER_SITE_OTHER): Payer: Commercial Managed Care - HMO

## 2016-04-22 DIAGNOSIS — Z23 Encounter for immunization: Secondary | ICD-10-CM | POA: Diagnosis not present

## 2016-04-22 NOTE — Telephone Encounter (Signed)
Spoke to pt and advised while in office.States she will schedule OV on her way out

## 2016-04-28 ENCOUNTER — Encounter: Payer: Self-pay | Admitting: Family Medicine

## 2016-04-28 ENCOUNTER — Ambulatory Visit (INDEPENDENT_AMBULATORY_CARE_PROVIDER_SITE_OTHER): Payer: Commercial Managed Care - HMO | Admitting: Family Medicine

## 2016-04-28 VITALS — BP 118/82 | HR 106 | Temp 97.7°F | Wt 210.5 lb

## 2016-04-28 DIAGNOSIS — I10 Essential (primary) hypertension: Secondary | ICD-10-CM

## 2016-04-28 MED ORDER — ATENOLOL 50 MG PO TABS: 50.0000 mg | ORAL_TABLET | Freq: Every day | ORAL | 1 refills | Status: DC

## 2016-04-28 NOTE — Assessment & Plan Note (Signed)
Well controlled.  No changes made. 

## 2016-04-28 NOTE — Progress Notes (Signed)
Subjective:   Patient ID: Gloria Murray, female    DOB: 1951/06/28, 65 y.o.   MRN: [id number]  DRU VERDOORN is a pleasant 65 y.o. year old female who presents to clinic today with Follow-up (BP)  on 04/28/2016  HPI: HTN- has been compliant with BP meds but had to change rxs due to manufacturing issues with Atenolol. She was previously taking Atenolol 50 mg daily along with HCTZ 12.5 mg daily. D/c atenolol and started Toprol XL 25 mg daily while continuing HCTZ 12.5 mg daily. Denies HA, blurred vision, CP or SOB. No LE edema.  Lab Results  Component Value Date   CREATININE 0.73 06/30/2015   Current Outpatient Prescriptions on File Prior to Visit  Medication Sig Dispense Refill  . Ascorbic Acid GRAN Take 1 tablet by mouth daily.      Marland Kitchen aspirin 81 MG tablet Take 81 mg by mouth daily.      . Calcium Carbonate-Vitamin D (CALCIUM-VITAMIN D) 600-200 MG-UNIT CAPS Take 1 tablet by mouth 2 (two) times daily.      . clopidogrel (PLAVIX) 75 MG tablet Take 1 tablet (75 mg total) by mouth daily. 90 tablet 3  . Garlic 123XX123 MG CAPS Take 1 capsule by mouth 2 (two) times daily.      . hydrochlorothiazide (MICROZIDE) 12.5 MG capsule Take 1 capsule (12.5 mg total) by mouth daily. 90 capsule 3  . levothyroxine (SYNTHROID, LEVOTHROID) 112 MCG tablet Take 1 tablet (112 mcg total) by mouth daily. 90 tablet 3  . loratadine (CLARITIN) 10 MG tablet Take 10 mg by mouth as needed for allergies.     . metoprolol succinate (TOPROL-XL) 25 MG 24 hr tablet Take 1 tablet (25 mg total) by mouth daily. 30 tablet 3  . Omega-3 Fatty Acids (FISH OIL) 1000 MG CAPS Take 1 capsule (1,000 mg total) by mouth daily.    . simvastatin (ZOCOR) 40 MG tablet Take 1 tablet (40 mg total) by mouth at bedtime. 90 tablet 3   No current facility-administered medications on file prior to visit.     Allergies  Allergen Reactions  . Propoxyphene Hcl Itching and Nausea Only    REACTION: nausea and itch  . Propoxyphene N-Acetaminophen  Itching and Nausea Only    REACTION: nausea and  itch    Past Medical History:  Diagnosis Date  . Allergy   . Carotid artery occlusion   . Hyperlipidemia   . Hypertension   . Osteoporosis   . Thyroid disease     Past Surgical History:  Procedure Laterality Date  . ARCH AORTOGRAM N/A 07/23/2014   Procedure: ARCH AORTOGRAM;  Surgeon: Rosetta Posner, MD;  Location: [facility] CATH LAB;  Service: Cardiovascular;  Laterality: N/A;  . CAROTID ANGIOGRAM N/A 07/23/2014   Procedure: CAROTID ANGIOGRAM;  Surgeon: Rosetta Posner, MD;  Location: [facility] CATH LAB;  Service: Cardiovascular;  Laterality: N/A;  . CAROTID ENDARTERECTOMY  12/11/2008   left  . CAROTID STENT INSERTION Left 10/14/2014   Procedure: CAROTID STENT INSERTION;  Surgeon: Serafina Mitchell, MD;  Location: [facility] CATH LAB;  Service: Cardiovascular;  Laterality: Left;   carotid  . CARPAL TUNNEL RELEASE     X 2, right wrist  . COLONOSCOPY  Jan. 2, 2015  . DILATION AND CURETTAGE OF UTERUS     X 2  . laceration hand Right    hand  . THYROIDECTOMY    . TUBAL LIGATION  1975   bilateral    Family History  Problem Relation Age of Onset  . Diabetes Mother   . Colon cancer Neg Hx     Social History   Social History  . Marital status: Widowed    Spouse name: N/A  . Number of children: N/A  . Years of education: N/A   Occupational History  . [place] History Main Topics  . Smoking status: Former Smoker    Packs/day: 0.50    Types: Cigarettes, E-cigarettes    Quit date: 07/23/2014  . Smokeless tobacco: Never Used  . Alcohol use No  . Drug use: No  . Sexual activity: Not on file   Other Topics Concern  . Not on file   Social History Narrative   Does not have a living will.   Does not have any family.      [name] would "make decisions" for her-desires CPR, does not want prolonged life support if futile.      Both of her children- died in car accident at ages 48 and 46- husband went into a diabetic coma  while driving.   The PMH, PSH, Social History, Family History, Medications, and allergies have been reviewed in [facility], and have been updated if relevant.   Review of Systems  Constitutional: Negative.   Respiratory: Negative.   Cardiovascular: Negative.   Musculoskeletal: Negative.   Hematological: Negative.   Psychiatric/Behavioral: Negative.   All other systems reviewed and are negative.      Objective:    BP 118/82   Pulse (!) 106   Temp 97.7 F (36.5 C) (Oral)   Wt 210 lb 8 oz (95.5 kg)   SpO2 96%   BMI 33.98 kg/m   BP Readings from Last 3 Encounters:  04/28/16 118/82  06/30/15 132/72  06/02/15 99/72    Physical Exam  Constitutional: She is oriented to person, place, and time. She appears well-developed and well-nourished. No distress.  HENT:  Head: Normocephalic.  Eyes: Conjunctivae are normal.  Cardiovascular: Normal rate and regular rhythm.   Pulmonary/Chest: Effort normal and breath sounds normal.  Musculoskeletal: Normal range of motion. She exhibits no edema.  Neurological: She is alert and oriented to person, place, and time. No cranial nerve deficit.  Skin: Skin is warm and dry. She is not diaphoretic.  Psychiatric: She has a normal mood and affect. Her behavior is normal. Judgment and thought content normal.  Nursing note and vitals reviewed.         Assessment & Plan:   HYPERTENSION, BENIGN ESSENTIAL No Follow-up on file.

## 2016-04-28 NOTE — Progress Notes (Signed)
Pre visit review using our clinic review tool, if applicable. No additional management support is needed unless otherwise documented below in the visit note. 

## 2016-05-06 ENCOUNTER — Other Ambulatory Visit: Payer: Self-pay | Admitting: Pharmacist

## 2016-05-06 NOTE — Patient Outreach (Signed)
Outreach call to Gloria Murray regarding her request for follow up from the [facility] Medication Adherence Campaign. Left a HIPAA compliant message on the patient's voicemail.   Harlow Asa, PharmD Clinical Pharmacist [facility] [phone]

## 2016-05-31 ENCOUNTER — Telehealth: Payer: Self-pay

## 2016-05-31 NOTE — Telephone Encounter (Signed)
Called back and said she needs Levothyroxine, HCTZ, simvastatin, and metoprolol sent to [facility]

## 2016-05-31 NOTE — Telephone Encounter (Signed)
Pt called to ask for a refill of all her meds be sent to [facility]. I have have left a message for her asking her to be more specific.

## 2016-06-01 MED ORDER — METOPROLOL SUCCINATE ER 25 MG PO TB24: 25.0000 mg | ORAL_TABLET | Freq: Every day | ORAL | 3 refills | Status: DC

## 2016-06-01 MED ORDER — SIMVASTATIN 40 MG PO TABS: 40.0000 mg | ORAL_TABLET | Freq: Every day | ORAL | 0 refills | Status: DC

## 2016-06-01 MED ORDER — LEVOTHYROXINE SODIUM 112 MCG PO TABS: 112.0000 ug | ORAL_TABLET | Freq: Every day | ORAL | 0 refills | Status: DC

## 2016-06-01 MED ORDER — HYDROCHLOROTHIAZIDE 12.5 MG PO CAPS: 12.5000 mg | ORAL_CAPSULE | Freq: Every day | ORAL | 3 refills | Status: DC

## 2016-06-01 NOTE — Telephone Encounter (Signed)
Lm on pts vm and advised Rx sent to requested pharmacy but OV with labs will be required for any additional refills. Last labs for lipids and thyroid 06/2015

## 2016-06-03 ENCOUNTER — Encounter: Payer: Self-pay | Admitting: Family

## 2016-06-07 ENCOUNTER — Ambulatory Visit (INDEPENDENT_AMBULATORY_CARE_PROVIDER_SITE_OTHER): Payer: Commercial Managed Care - HMO | Admitting: Family

## 2016-06-07 ENCOUNTER — Encounter: Payer: Self-pay | Admitting: Family Medicine

## 2016-06-07 ENCOUNTER — Ambulatory Visit (HOSPITAL_COMMUNITY)
Admission: RE | Admit: 2016-06-07 | Discharge: 2016-06-07 | Disposition: A | Discharge status: Home / Self Care | Payer: Commercial Managed Care - HMO | Source: Ambulatory Visit | Attending: Family | Admitting: Family

## 2016-06-07 ENCOUNTER — Encounter: Payer: Self-pay | Admitting: Family

## 2016-06-07 ENCOUNTER — Ambulatory Visit (INDEPENDENT_AMBULATORY_CARE_PROVIDER_SITE_OTHER): Payer: Commercial Managed Care - HMO | Admitting: Family Medicine

## 2016-06-07 ENCOUNTER — Other Ambulatory Visit: Payer: Self-pay | Admitting: Family Medicine

## 2016-06-07 VITALS — BP 136/82 | HR 108 | Temp 98.1°F | Wt 206.0 lb

## 2016-06-07 VITALS — BP 122/85 | HR 84 | Temp 97.9°F | Ht 66.0 in

## 2016-06-07 DIAGNOSIS — Z9889 Other specified postprocedural states: Secondary | ICD-10-CM | POA: Diagnosis not present

## 2016-06-07 DIAGNOSIS — I6522 Occlusion and stenosis of left carotid artery: Secondary | ICD-10-CM

## 2016-06-07 DIAGNOSIS — Z1231 Encounter for screening mammogram for malignant neoplasm of breast: Secondary | ICD-10-CM

## 2016-06-07 DIAGNOSIS — Z48812 Encounter for surgical aftercare following surgery on the circulatory system: Secondary | ICD-10-CM | POA: Diagnosis not present

## 2016-06-07 DIAGNOSIS — Z959 Presence of cardiac and vascular implant and graft, unspecified: Secondary | ICD-10-CM

## 2016-06-07 DIAGNOSIS — E039 Hypothyroidism, unspecified: Secondary | ICD-10-CM

## 2016-06-07 DIAGNOSIS — I6523 Occlusion and stenosis of bilateral carotid arteries: Secondary | ICD-10-CM | POA: Insufficient documentation

## 2016-06-07 DIAGNOSIS — I10 Essential (primary) hypertension: Secondary | ICD-10-CM | POA: Diagnosis not present

## 2016-06-07 DIAGNOSIS — I771 Stricture of artery: Secondary | ICD-10-CM

## 2016-06-07 DIAGNOSIS — Z87891 Personal history of nicotine dependence: Secondary | ICD-10-CM | POA: Diagnosis not present

## 2016-06-07 DIAGNOSIS — E782 Mixed hyperlipidemia: Secondary | ICD-10-CM | POA: Diagnosis not present

## 2016-06-07 LAB — COMPREHENSIVE METABOLIC PANEL
ALK PHOS: 61 U/L (ref 39–117)
ALT: 12 U/L (ref 0–35)
AST: 14 U/L (ref 0–37)
Albumin: 4.2 g/dL (ref 3.5–5.2)
BILIRUBIN TOTAL: 0.6 mg/dL (ref 0.2–1.2)
BUN: 15 mg/dL (ref 6–23)
CO2: 32 meq/L (ref 19–32)
CREATININE: 0.71 mg/dL (ref 0.40–1.20)
Calcium: 9.7 mg/dL (ref 8.4–10.5)
Chloride: 105 mEq/L (ref 96–112)
GFR: 87.25 mL/min (ref >60.00)
GLUCOSE: 116 mg/dL — AB (ref 70–99)
Potassium: 3.6 mEq/L (ref 3.5–5.1)
SODIUM: 142 meq/L (ref 135–145)
TOTAL PROTEIN: 6.9 g/dL (ref 6.0–8.3)

## 2016-06-07 LAB — T4, FREE: Free T4: 1.3 ng/dL (ref 0.60–1.60)

## 2016-06-07 LAB — CBC WITH DIFFERENTIAL/PLATELET
BASOS ABS: 0 10*3/uL (ref 0.0–0.1)
Basophils Relative: 0.5 % (ref 0.0–3.0)
EOS ABS: 0.1 10*3/uL (ref 0.0–0.7)
Eosinophils Relative: 2.8 % (ref 0.0–5.0)
HCT: 43.6 % (ref 36.0–46.0)
Hemoglobin: 14.9 g/dL (ref 12.0–15.0)
LYMPHS ABS: 1.3 10*3/uL (ref 0.7–4.0)
Lymphocytes Relative: 29.9 % (ref 12.0–46.0)
MCHC: 34.1 g/dL (ref 30.0–36.0)
MCV: 90.3 fl (ref 78.0–100.0)
MONOS PCT: 7.2 % (ref 3.0–12.0)
Monocytes Absolute: 0.3 10*3/uL (ref 0.1–1.0)
NEUTROS PCT: 59.6 % (ref 43.0–77.0)
Neutro Abs: 2.5 10*3/uL (ref 1.4–7.7)
Platelets: 320 10*3/uL (ref 150.0–400.0)
RBC: 4.83 Mil/uL (ref 3.87–5.11)
RDW: 13.2 % (ref 11.5–15.5)
WBC: 4.2 10*3/uL (ref 4.0–10.5)

## 2016-06-07 LAB — TSH: TSH: 0.74 u[IU]/mL (ref 0.35–4.50)

## 2016-06-07 LAB — LIPID PANEL
CHOL/HDL RATIO: 4
Cholesterol: 159 mg/dL (ref 0–200)
HDL: 37.7 mg/dL — ABNORMAL LOW (ref >39.00)
LDL Cholesterol: 90 mg/dL (ref 0–99)
NONHDL: 121.37
Triglycerides: 155 mg/dL — ABNORMAL HIGH (ref 0.0–149.0)
VLDL: 31 mg/dL (ref 0.0–40.0)

## 2016-06-07 MED ORDER — CLOPIDOGREL BISULFATE 75 MG PO TABS: 75.0000 mg | ORAL_TABLET | Freq: Every day | ORAL | 3 refills | Status: DC

## 2016-06-07 NOTE — Progress Notes (Signed)
Subjective:   Patient ID: Gloria Murray, female    DOB: May 30, 1951, 65 y.o.   MRN: [id number]  Gloria Murray is a pleasant 65 y.o. year old female who presents to clinic today with Follow-up and Labs Only  on 06/07/2016  HPI:  HTN- had to change rxs in 04/2016 due to manufacturing issues with atenolol. Currently taking Toprol XL 25 mg daily and HCTZ 12.5 mg daily. BP has been controlled. Denies any CP, HA, blurred vision or LE edema.  Lab Results  Component Value Date   CREATININE 0.73 06/30/2015   Hypothyroidism- denies any symptoms of hypo or hyperthyroidism.  Currently taking synthroid 112 mcg daily.  Lab Results  Component Value Date   TSH 1.64 06/30/2015   HLD- Taking zocor 40 mg daily.  Lab Results  Component Value Date   CHOL 157 06/30/2015   HDL 37.50 (L) 06/30/2015   LDLCALC 96 06/30/2015   LDLDIRECT 95.9 05/17/2010   TRIG 119.0 06/30/2015   CHOLHDL 4 06/30/2015   Lab Results  Component Value Date   ALT 18 06/30/2015   AST 18 06/30/2015   ALKPHOS 58 06/30/2015   BILITOT 0.5 06/30/2015   Current Outpatient Prescriptions on File Prior to Visit  Medication Sig Dispense Refill  . Ascorbic Acid GRAN Take 1 tablet by mouth daily.      Marland Kitchen aspirin 81 MG tablet Take 81 mg by mouth daily.      . Calcium Carbonate-Vitamin D (CALCIUM-VITAMIN D) 600-200 MG-UNIT CAPS Take 1 tablet by mouth 2 (two) times daily.      . clopidogrel (PLAVIX) 75 MG tablet Take 1 tablet (75 mg total) by mouth daily. 90 tablet 3  . Garlic 123XX123 MG CAPS Take 1 capsule by mouth 2 (two) times daily.      . hydrochlorothiazide (MICROZIDE) 12.5 MG capsule Take 1 capsule (12.5 mg total) by mouth daily. 90 capsule 3  . levothyroxine (SYNTHROID, LEVOTHROID) 112 MCG tablet Take 1 tablet (112 mcg total) by mouth daily. 90 tablet 0  . loratadine (CLARITIN) 10 MG tablet Take 10 mg by mouth as needed for allergies.     . metoprolol succinate (TOPROL-XL) 25 MG 24 hr tablet Take 1 tablet (25 mg total) by mouth  daily. 90 tablet 3  . Omega-3 Fatty Acids (FISH OIL) 1000 MG CAPS Take 1 capsule (1,000 mg total) by mouth daily.    . simvastatin (ZOCOR) 40 MG tablet Take 1 tablet (40 mg total) by mouth at bedtime. 90 tablet 0   No current facility-administered medications on file prior to visit.     Allergies  Allergen Reactions  . Propoxyphene Hcl Itching and Nausea Only    REACTION: nausea and itch  . Propoxyphene N-Acetaminophen Itching and Nausea Only    REACTION: nausea and  itch    Past Medical History:  Diagnosis Date  . Allergy   . Carotid artery occlusion   . Hyperlipidemia   . Hypertension   . Osteoporosis   . Thyroid disease     Past Surgical History:  Procedure Laterality Date  . ARCH AORTOGRAM N/A 07/23/2014   Procedure: ARCH AORTOGRAM;  Surgeon: Rosetta Posner, MD;  Location: [facility] CATH LAB;  Service: Cardiovascular;  Laterality: N/A;  . CAROTID ANGIOGRAM N/A 07/23/2014   Procedure: CAROTID ANGIOGRAM;  Surgeon: Rosetta Posner, MD;  Location: [facility] CATH LAB;  Service: Cardiovascular;  Laterality: N/A;  . CAROTID ENDARTERECTOMY  12/11/2008   left  . CAROTID STENT INSERTION Left 10/14/2014  Procedure: CAROTID STENT INSERTION;  Surgeon: Serafina Mitchell, MD;  Location: [facility] CATH LAB;  Service: Cardiovascular;  Laterality: Left;   carotid  . CARPAL TUNNEL RELEASE     X 2, right wrist  . COLONOSCOPY  Jan. 2, 2015  . DILATION AND CURETTAGE OF UTERUS     X 2  . laceration hand Right    hand  . THYROIDECTOMY    . TUBAL LIGATION  1975   bilateral    Family History  Problem Relation Age of Onset  . Diabetes Mother   . Colon cancer Neg Hx     Social History   Social History  . Marital status: Widowed    Spouse name: N/A  . Number of children: N/A  . Years of education: N/A   Occupational History  . [place] History Main Topics  . Smoking status: Former Smoker    Packs/day: 0.50    Types: Cigarettes, E-cigarettes    Quit date: 07/23/2014  . Smokeless  tobacco: Never Used  . Alcohol use No  . Drug use: No  . Sexual activity: Not on file   Other Topics Concern  . Not on file   Social History Narrative   Does not have a living will.   Does not have any family.      Gloria Murray would "make decisions" for her-desires CPR, does not want prolonged life support if futile.      Both of her children- died in car accident at ages 58 and 40- husband went into a diabetic coma while driving.   The PMH, PSH, Social History, Family History, Medications, and allergies have been reviewed in [facility], and have been updated if relevant.   Review of Systems  Constitutional: Negative.   HENT: Negative.   Eyes: Negative.   Respiratory: Negative.   Cardiovascular: Negative.   Gastrointestinal: Negative.   Endocrine: Negative.   Genitourinary: Negative.   Musculoskeletal: Negative.   Skin: Negative.   Allergic/Immunologic: Negative.   Neurological: Negative.   Hematological: Negative.   Psychiatric/Behavioral: Negative.   All other systems reviewed and are negative.      Objective:    BP 136/82   Pulse (!) 108   Temp 98.1 F (36.7 C) (Oral)   Wt 206 lb (93.4 kg)   SpO2 96%   BMI 33.25 kg/m    Physical Exam  Constitutional: She is oriented to person, place, and time. She appears well-developed and well-nourished. No distress.  HENT:  Head: Normocephalic and atraumatic.  Eyes: Conjunctivae are normal.  Cardiovascular: Normal rate and regular rhythm.   Pulmonary/Chest: Effort normal and breath sounds normal.  Abdominal: Soft. Bowel sounds are normal. She exhibits no distension. There is no tenderness. There is no rebound.  Musculoskeletal: Normal range of motion.  Neurological: She is alert and oriented to person, place, and time. No cranial nerve deficit.  Skin: Skin is warm. She is not diaphoretic.  Psychiatric: She has a normal mood and affect. Her behavior is normal. Judgment and thought content normal.  Nursing note and vitals  reviewed.         Assessment & Plan:   Hypothyroidism, unspecified type  HYPERLIPIDEMIA, MIXED, MILD  HYPERTENSION, BENIGN ESSENTIAL No Follow-up on file.

## 2016-06-07 NOTE — Assessment & Plan Note (Signed)
Continue current rx. Due for labs.

## 2016-06-07 NOTE — Progress Notes (Signed)
Pre visit review using our clinic review tool, if applicable. No additional management support is needed unless otherwise documented below in the visit note. 

## 2016-06-07 NOTE — Patient Instructions (Signed)
Stroke Prevention Some medical conditions and behaviors are associated with an increased chance of having a stroke. You may prevent a stroke by making healthy choices and managing medical conditions. HOW CAN I REDUCE MY RISK OF HAVING A STROKE?   Stay physically active. Get at least 30 minutes of activity on most or all days.  Do not smoke. It may also be helpful to avoid exposure to secondhand smoke.  Limit alcohol use. Moderate alcohol use is considered to be:  No more than 2 drinks per day for men.  No more than 1 drink per day for nonpregnant women.  Eat healthy foods. This involves:  Eating 5 or more servings of fruits and vegetables a day.  Making dietary changes that address high blood pressure (hypertension), high cholesterol, diabetes, or obesity.  Manage your cholesterol levels.  Making food choices that are high in fiber and low in saturated fat, trans fat, and cholesterol may control cholesterol levels.  Take any prescribed medicines to control cholesterol as directed by your health care provider.  Manage your diabetes.  Controlling your carbohydrate and sugar intake is recommended to manage diabetes.  Take any prescribed medicines to control diabetes as directed by your health care provider.  Control your hypertension.  Making food choices that are low in salt (sodium), saturated fat, trans fat, and cholesterol is recommended to manage hypertension.  Ask your health care provider if you need treatment to lower your blood pressure. Take any prescribed medicines to control hypertension as directed by your health care provider.  If you are 18-39 years of age, have your blood pressure checked every 3-5 years. If you are 40 years of age or older, have your blood pressure checked every year.  Maintain a healthy weight.  Reducing calorie intake and making food choices that are low in sodium, saturated fat, trans fat, and cholesterol are recommended to manage  weight.  Stop drug abuse.  Avoid taking birth control pills.  Talk to your health care provider about the risks of taking birth control pills if you are over 35 years old, smoke, get migraines, or have ever had a blood clot.  Get evaluated for sleep disorders (sleep apnea).  Talk to your health care provider about getting a sleep evaluation if you snore a lot or have excessive sleepiness.  Take medicines only as directed by your health care provider.  For some people, aspirin or blood thinners (anticoagulants) are helpful in reducing the risk of forming abnormal blood clots that can lead to stroke. If you have the irregular heart rhythm of atrial fibrillation, you should be on a blood thinner unless there is a good reason you cannot take them.  Understand all your medicine instructions.  Make sure that other conditions (such as anemia or atherosclerosis) are addressed. SEEK IMMEDIATE MEDICAL CARE IF:   You have sudden weakness or numbness of the face, arm, or leg, especially on one side of the body.  Your face or eyelid droops to one side.  You have sudden confusion.  You have trouble speaking (aphasia) or understanding.  You have sudden trouble seeing in one or both eyes.  You have sudden trouble walking.  You have dizziness.  You have a loss of balance or coordination.  You have a sudden, severe headache with no known cause.  You have new chest pain or an irregular heartbeat. Any of these symptoms may represent a serious problem that is an emergency. Do not wait to see if the symptoms will   go away. Get medical help at once. Call your local emergency services (911 in [place] Do not drive yourself to the hospital.   This information is not intended to replace advice given to you by your health care provider. Make sure you discuss any questions you have with your health care provider.   Document Released: 08/25/2004 Document Revised: 08/08/2014 Document Reviewed:  01/18/2013 Elsevier Interactive Patient Education 2016 [employer]  

## 2016-06-07 NOTE — Progress Notes (Signed)
Chief Complaint: Follow up Extracranial Carotid Artery Stenosis   History of Present Illness  Gloria Murray is a 65 y.o. female patient of Dr. Donnetta Hutching who returns today for follow-up of extracranial cerebrovascular occlusive disease. She is status post left carotid endarterectomy in May 2010. She was found to have high-grade distal common carotid artery restenosis. She underwent uneventful left carotid stenting by Dr. Trula Slade on 10/14/14 and is done well since this. She has had no neurologic deficits.  Most importantly she quit smoking following the stenting has not resumed this.  The patient has no history of TIA or stroke symptoms.Specifically the patient denies a history of amaurosis fugax or monocular blindness, unilateral facial drooping, hemiplegia, or receptive or expressive aphasia.  She denies steal symptoms in either arm, denies dizziness.  She does lots of walking on her job.  August 02, 2013 colonoscopy, normal per pt, normal mammogram She denies claudication symptoms with walking, denies non healing wounds, denies any cardiac problems. She also denies chest pain, denies dyspnea, denies feeling light headed. Marland Kitchen   Pt Diabetic: No Pt smoker: former smoker, started at age 89 yrs, quit  In March 2016  Pt meds include: Statin : Yes ASA: yes Other anticoagulants/antiplatelets: Plavix since the carotid stent placed March 2016    Past Medical History:  Diagnosis Date  . Allergy   . Carotid artery occlusion   . Hyperlipidemia   . Hypertension   . Osteoporosis   . Thyroid disease     Social History Social History  Substance Use Topics  . Smoking status: Former Smoker    Packs/day: 0.50    Types: Cigarettes, E-cigarettes    Quit date: 07/23/2014  . Smokeless tobacco: Never Used  . Alcohol use No    Family History Family History  Problem Relation Age of Onset  . Diabetes Mother   . Colon cancer Neg Hx     Surgical History Past Surgical History:   Procedure Laterality Date  . ARCH AORTOGRAM N/A 07/23/2014   Procedure: ARCH AORTOGRAM;  Surgeon: Rosetta Posner, MD;  Location: [facility] CATH LAB;  Service: Cardiovascular;  Laterality: N/A;  . CAROTID ANGIOGRAM N/A 07/23/2014   Procedure: CAROTID ANGIOGRAM;  Surgeon: Rosetta Posner, MD;  Location: [facility] CATH LAB;  Service: Cardiovascular;  Laterality: N/A;  . CAROTID ENDARTERECTOMY  12/11/2008   left  . CAROTID STENT INSERTION Left 10/14/2014   Procedure: CAROTID STENT INSERTION;  Surgeon: Serafina Mitchell, MD;  Location: [facility] CATH LAB;  Service: Cardiovascular;  Laterality: Left;   carotid  . CARPAL TUNNEL RELEASE     X 2, right wrist  . COLONOSCOPY  Jan. 2, 2015  . DILATION AND CURETTAGE OF UTERUS     X 2  . laceration hand Right    hand  . THYROIDECTOMY    . TUBAL LIGATION  1975   bilateral    Allergies  Allergen Reactions  . Propoxyphene Hcl Itching and Nausea Only    REACTION: nausea and itch  . Propoxyphene N-Acetaminophen Itching and Nausea Only    REACTION: nausea and  itch    Current Outpatient Prescriptions  Medication Sig Dispense Refill  . Ascorbic Acid GRAN Take 1 tablet by mouth daily.      Marland Kitchen aspirin 81 MG tablet Take 81 mg by mouth daily.      . Calcium Carbonate-Vitamin D (CALCIUM-VITAMIN D) 600-200 MG-UNIT CAPS Take 1 tablet by mouth 2 (two) times daily.      . clopidogrel (PLAVIX) 75  MG tablet Take 1 tablet (75 mg total) by mouth daily. 90 tablet 3  . Garlic 123XX123 MG CAPS Take 1 capsule by mouth 2 (two) times daily.      . hydrochlorothiazide (MICROZIDE) 12.5 MG capsule Take 1 capsule (12.5 mg total) by mouth daily. 90 capsule 3  . levothyroxine (SYNTHROID, LEVOTHROID) 112 MCG tablet Take 1 tablet (112 mcg total) by mouth daily. 90 tablet 0  . loratadine (CLARITIN) 10 MG tablet Take 10 mg by mouth as needed for allergies.     . metoprolol succinate (TOPROL-XL) 25 MG 24 hr tablet Take 1 tablet (25 mg total) by mouth daily. 90 tablet 3  . Omega-3 Fatty Acids (FISH OIL) 1000  MG CAPS Take 1 capsule (1,000 mg total) by mouth daily.    . simvastatin (ZOCOR) 40 MG tablet Take 1 tablet (40 mg total) by mouth at bedtime. 90 tablet 0   No current facility-administered medications for this visit.     Review of Systems : See HPI for pertinent positives and negatives.  Physical Examination  Vitals:   06/07/16 1454 06/07/16 1501  BP: 134/87 122/85  Pulse: 98 84  Temp: 97.9 F (36.6 C)   SpO2: 94%   Height: 5\' 6"  (1.676 m)    Body mass index is 33.25 kg/m.  General: obese female in NAD GAIT: normal Eyes: PERRLA Pulmonary: Respirations are non-labored, CTAB, no rales, no rhonchi, & no wheezing.  Cardiac: regular rhythm, positive murmur.  VASCULAR EXAM Carotid Bruits Left Right   positive Positive    Aorta is not palpable. Radial pulses are 2+ palpable and equal.  Bilateral DP pulses are 2+ palpable    Gastrointestinal: soft, nontender, BS WNL, no r/g,no palpable masses.  Musculoskeletal: No muscle atrophy/wasting. M/S 5/5 throughout, extremities without ischemic changes.  Neurologic: A&O X 3; Appropriate Affect, normal sensation, Speech is normal CN 2-12 intact, Pain and light touch intact in extremities, Motor exam as listed above.     Assessment: Gloria Murray is a 65 y.o. female who is status post left carotid endarterectomy in May 2010. She was found to have high-grade distal common carotid artery restenosis. She underwent uneventful left carotid stenting by Dr. Trula Slade on 10/14/14 and is done well since this. She has had no neurologic deficits. Most importantly she quit smoking following the stenting has not resumed this. She has no history of stroke or TIA.   Dr. Luther Parody April 2016 note: Stable status post carotid stenting of recurrent stenosis. Will continue usual activities. Again was congratulated  on her smoking cessation. We'll see her in 6 months with repeat carotid duplex. Assuming this is normal we'll drop back to yearly intervals. Will continue on Plavix long-term.  At pt's visit on 06/02/15, I discussed with Dr. Donnetta Hutching pt's asymptomatic 75-99% right subclavian stenosis at that time. Since she is asymptomatic there was no need to address.   She denies bleeding or bruising issues, Plavix refilled at pt request.   DATA Today's carotid duplex suggests 40%-59% right internal carotid artery stenosis. Left common carotid artery is patent with stent present, no hyperplasia or hemodynamically significant changes present. Left internal carotid artery is patent, less than 40% stenosis. Right vertebral artery is retrograde, left is antegrade. Right subclavian artery is stenotic, left is normal and triphasic.  Essentially unchanged since previous study on 11/24/2014 and 06/02/15.   Plan: Plavix refilled for another year, 90 days supply with 3 refills. Follow-up in 1 year with Carotid Duplex scan.   I discussed  in depth with the patient the nature of atherosclerosis, and emphasized the importance of maximal medical management including strict control of blood pressure, blood glucose, and lipid levels, obtaining regular exercise, and continued cessation of smoking.  The patient is aware that without maximal medical management the underlying atherosclerotic disease process will progress, limiting the benefit of any interventions. The patient was given information about stroke prevention and what symptoms should prompt the patient to seek immediate medical care. Thank you for allowing [place] to participate in this patient's care.  Clemon Chambers, RN, MSN, FNP-C [facility] Office: [phone]  Clinic Physician: Early  06/07/16 3:04 PM

## 2016-06-07 NOTE — Assessment & Plan Note (Signed)
Well controlled with recent change in rxs. No changes made today.

## 2016-06-07 NOTE — Assessment & Plan Note (Signed)
Continue current dose of synthroid. Due for labs today. 

## 2016-06-08 ENCOUNTER — Encounter: Payer: Self-pay | Admitting: *Deleted

## 2016-06-21 ENCOUNTER — Ambulatory Visit: Payer: Commercial Managed Care - HMO

## 2016-06-22 ENCOUNTER — Ambulatory Visit
Admission: RE | Admit: 2016-06-22 | Discharge: 2016-06-22 | Disposition: A | Discharge status: Home / Self Care | Payer: Commercial Managed Care - HMO | Source: Ambulatory Visit | Attending: Family Medicine | Admitting: Family Medicine

## 2016-06-22 DIAGNOSIS — Z1231 Encounter for screening mammogram for malignant neoplasm of breast: Secondary | ICD-10-CM

## 2016-06-22 NOTE — Addendum Note (Signed)
Addended by: Lianne Cure A on: 06/22/2016 09:58 AM   Modules accepted: Orders

## 2016-07-21 ENCOUNTER — Encounter: Payer: Commercial Managed Care - HMO | Admitting: Family Medicine

## 2016-07-21 ENCOUNTER — Ambulatory Visit (INDEPENDENT_AMBULATORY_CARE_PROVIDER_SITE_OTHER): Payer: Commercial Managed Care - HMO

## 2016-07-21 ENCOUNTER — Ambulatory Visit (INDEPENDENT_AMBULATORY_CARE_PROVIDER_SITE_OTHER): Payer: Commercial Managed Care - HMO | Admitting: Family Medicine

## 2016-07-21 VITALS — BP 118/78 | HR 102 | Temp 98.6°F | Ht 65.5 in | Wt 204.5 lb

## 2016-07-21 DIAGNOSIS — E782 Mixed hyperlipidemia: Secondary | ICD-10-CM | POA: Diagnosis not present

## 2016-07-21 DIAGNOSIS — Z0000 Encounter for general adult medical examination without abnormal findings: Secondary | ICD-10-CM

## 2016-07-21 DIAGNOSIS — E039 Hypothyroidism, unspecified: Secondary | ICD-10-CM | POA: Diagnosis not present

## 2016-07-21 DIAGNOSIS — Z01419 Encounter for gynecological examination (general) (routine) without abnormal findings: Secondary | ICD-10-CM | POA: Diagnosis not present

## 2016-07-21 DIAGNOSIS — H6122 Impacted cerumen, left ear: Secondary | ICD-10-CM

## 2016-07-21 DIAGNOSIS — I10 Essential (primary) hypertension: Secondary | ICD-10-CM

## 2016-07-21 DIAGNOSIS — H6120 Impacted cerumen, unspecified ear: Secondary | ICD-10-CM | POA: Insufficient documentation

## 2016-07-21 MED ORDER — SIMVASTATIN 40 MG PO TABS: 40.0000 mg | ORAL_TABLET | Freq: Every day | ORAL | 3 refills | Status: DC

## 2016-07-21 MED ORDER — HYDROCHLOROTHIAZIDE 12.5 MG PO CAPS: 12.5000 mg | ORAL_CAPSULE | Freq: Every day | ORAL | 3 refills | Status: DC

## 2016-07-21 MED ORDER — METOPROLOL SUCCINATE ER 25 MG PO TB24: 25.0000 mg | ORAL_TABLET | Freq: Every day | ORAL | 3 refills | Status: DC

## 2016-07-21 MED ORDER — LEVOTHYROXINE SODIUM 112 MCG PO TABS: 112.0000 ug | ORAL_TABLET | Freq: Every day | ORAL | 3 refills | Status: DC

## 2016-07-21 NOTE — Addendum Note (Signed)
Addended by: Lucille Passy on: 07/21/2016 11:30 AM   Modules accepted: Orders

## 2016-07-21 NOTE — Progress Notes (Signed)
Pre visit review using our clinic review tool, if applicable. No additional management support is needed unless otherwise documented below in the visit note. 

## 2016-07-21 NOTE — Assessment & Plan Note (Signed)
Ceruminosis is noted.  Wax is removed by syringing and manual debridement. Instructions for home care to prevent wax buildup are given.  

## 2016-07-21 NOTE — Assessment & Plan Note (Signed)
Well controlled. No changes made to rxs. 

## 2016-07-21 NOTE — Progress Notes (Addendum)
Subjective:   Patient ID: Gloria Murray, female    DOB: 09-18-1950, 65 y.o.   MRN: [id number]  Gloria Murray is a pleasant 65 y.o. year old female who presents to clinic today with Annual Exam  on 07/21/2016  HPI:  Annual wellness visit with Candis Musa, RN this morning.  Note reviewed.  She is having difficult hearing from her left ear, also failed her hearing screen.   HTN- had to change rxs in 04/2016 due to manufacturing issues with atenolol. Currently taking Toprol XL 25 mg daily and HCTZ 12.5 mg daily. BP has been controlled. Denies any CP, HA, blurred vision or LE edema.  Lab Results  Component Value Date   CREATININE 0.71 06/07/2016    Hypothyroidism- denies any symptoms of hypo or hyperthyroidism.  Currently taking synthroid 112 mcg daily.  Lab Results  Component Value Date   TSH 0.74 06/07/2016  HLD- Taking zocor 40 mg daily. Lab Results  Component Value Date   CHOL 159 06/07/2016   HDL 37.70 (L) 06/07/2016   LDLCALC 90 06/07/2016   LDLDIRECT 95.9 05/17/2010   TRIG 155.0 (H) 06/07/2016   CHOLHDL 4 06/07/2016   Lab Results  Component Value Date   ALT 12 06/07/2016   AST 14 06/07/2016   ALKPHOS 61 06/07/2016   BILITOT 0.6 06/07/2016   Current Outpatient Prescriptions on File Prior to Visit  Medication Sig Dispense Refill  . Ascorbic Acid GRAN Take 1 tablet by mouth daily.      Marland Kitchen aspirin 81 MG tablet Take 81 mg by mouth daily.      . Calcium Carbonate-Vitamin D (CALCIUM-VITAMIN D) 600-200 MG-UNIT CAPS Take 1 tablet by mouth 2 (two) times daily.      . clopidogrel (PLAVIX) 75 MG tablet Take 1 tablet (75 mg total) by mouth daily. 90 tablet 3  . Garlic 123XX123 MG CAPS Take 1 capsule by mouth 2 (two) times daily.      . hydrochlorothiazide (MICROZIDE) 12.5 MG capsule Take 1 capsule (12.5 mg total) by mouth daily. 90 capsule 3  . levothyroxine (SYNTHROID, LEVOTHROID) 112 MCG tablet Take 1 tablet (112 mcg total) by mouth daily. 90 tablet 0  . loratadine  (CLARITIN) 10 MG tablet Take 10 mg by mouth as needed for allergies.     . metoprolol succinate (TOPROL-XL) 25 MG 24 hr tablet Take 1 tablet (25 mg total) by mouth daily. 90 tablet 3  . Omega-3 Fatty Acids (FISH OIL) 1000 MG CAPS Take 1 capsule (1,000 mg total) by mouth daily.    . simvastatin (ZOCOR) 40 MG tablet Take 1 tablet (40 mg total) by mouth at bedtime. 90 tablet 0   No current facility-administered medications on file prior to visit.     Allergies  Allergen Reactions  . Propoxyphene Hcl Itching and Nausea Only    REACTION: nausea and itch  . Propoxyphene N-Acetaminophen Itching and Nausea Only    REACTION: nausea and  itch    Past Medical History:  Diagnosis Date  . Allergy   . Carotid artery occlusion   . Hyperlipidemia   . Hypertension   . Osteoporosis   . Thyroid disease     Past Surgical History:  Procedure Laterality Date  . ARCH AORTOGRAM N/A 07/23/2014   Procedure: ARCH AORTOGRAM;  Surgeon: Rosetta Posner, MD;  Location: [facility] CATH LAB;  Service: Cardiovascular;  Laterality: N/A;  . CAROTID ANGIOGRAM N/A 07/23/2014   Procedure: CAROTID ANGIOGRAM;  Surgeon: Rosetta Posner, MD;  Location: [facility] CATH LAB;  Service: Cardiovascular;  Laterality: N/A;  . CAROTID ENDARTERECTOMY  12/11/2008   left  . CAROTID STENT INSERTION Left 10/14/2014   Procedure: CAROTID STENT INSERTION;  Surgeon: Serafina Mitchell, MD;  Location: [facility] CATH LAB;  Service: Cardiovascular;  Laterality: Left;   carotid  . CARPAL TUNNEL RELEASE     X 2, right wrist  . COLONOSCOPY  Jan. 2, 2015  . DILATION AND CURETTAGE OF UTERUS     X 2  . laceration hand Right    hand  . THYROIDECTOMY    . TUBAL LIGATION  1975   bilateral    Family History  Problem Relation Age of Onset  . Diabetes Mother   . Colon cancer Neg Hx     Social History   Social History  . Marital status: Widowed    Spouse name: N/A  . Number of children: N/A  . Years of education: N/A   Occupational History  . [place] History Main Topics  . Smoking status: Former Smoker    Packs/day: 0.50    Types: Cigarettes, E-cigarettes    Quit date: 07/23/2014  . Smokeless tobacco: Never Used  . Alcohol use No  . Drug use: No  . Sexual activity: No   Other Topics Concern  . Not on file   Social History Narrative   Does not have a living will.   Does not have any family.      [name] would "make decisions" for her-desires CPR, does not want prolonged life support if futile.      Both of her children- died in car accident at ages 50 and 35- husband went into a diabetic coma while driving.   The PMH, PSH, Social History, Family History, Medications, and allergies have been reviewed in [facility], and have been updated if relevant.   Review of Systems  Constitutional: Negative.   HENT: Positive for ear pain. Negative for ear discharge, facial swelling, hearing loss, mouth sores, nosebleeds and postnasal drip.   Respiratory: Negative.   Cardiovascular: Negative.   Gastrointestinal: Negative.   Musculoskeletal: Negative.   Neurological: Negative.   Hematological: Negative.   Psychiatric/Behavioral: Negative.   All other systems reviewed and are negative.      Objective:    BP 118/78 (BP Location: Left Arm, Patient Position: Sitting, Cuff Size: Normal)   Pulse (!) 102   Temp 98.6 F (37 C) (Oral)   Ht 5' 5.5" (1.664 m) Comment: no shoes  Wt 204 lb 8 oz (92.8 kg)   SpO2 96%   BMI 33.51 kg/m    Physical Exam   General:  Well-developed,well-nourished,in no acute distress; alert,appropriate and cooperative throughout examination Head:  normocephalic and atraumatic.   Eyes:  vision grossly intact, PERRL Ears: left cerumen impaction Nose:  no external deformity.   Mouth:  good dentition.   Neck:  No deformities, masses, or tenderness noted. Lungs:  Normal respiratory effort, chest expands symmetrically. Lungs are clear to auscultation, no crackles or wheezes. Heart:  Soft  murmur Abdomen:  Bowel sounds positive,abdomen soft and non-tender without masses, organomegaly or hernias noted. Msk:  No deformity or scoliosis noted of thoracic or lumbar spine.   Extremities:  No clubbing, cyanosis, edema, or deformity noted with normal full range of motion of all joints.   Neurologic:  alert & oriented X3 and gait normal.   Skin:  Intact without suspicious lesions or rashes Cervical Nodes:  No lymphadenopathy  noted Axillary Nodes:  No palpable lymphadenopathy Psych:  Cognition and judgment appear intact. Alert and cooperative with normal attention span and concentration. No apparent delusions, illusions, hallucinations       Assessment & Plan:   HYPERLIPIDEMIA, MIXED, MILD  Well woman exam  Hypothyroidism, unspecified type  HYPERTENSION, BENIGN ESSENTIAL No Follow-up on file.

## 2016-07-21 NOTE — Assessment & Plan Note (Signed)
Reviewed preventive care protocols, scheduled due services, and updated immunizations Discussed nutrition, exercise, diet, and healthy lifestyle.  

## 2016-07-21 NOTE — Progress Notes (Signed)
Subjective:   Gloria Murray is a 65 y.o. female who presents for Medicare Annual (Subsequent) preventive examination.  Review of Systems:  N/A Cardiac Risk Factors include: advanced age (>17men, >77 women);dyslipidemia;hypertension;obesity (BMI >30kg/m2)     Objective:     Vitals: BP 118/78 (BP Location: Left Arm, Patient Position: Sitting, Cuff Size: Normal)   Pulse (!) 102   Temp 98.6 F (37 C) (Oral)   Ht 5' 5.5" (1.664 m) Comment: no shoes  Wt 204 lb 8 oz (92.8 kg)   SpO2 96%   BMI 33.51 kg/m   Body mass index is 33.51 kg/m.   Tobacco History  Smoking Status  . Former Smoker  . Packs/day: 0.50  . Types: Cigarettes, E-cigarettes  . Quit date: 07/23/2014  Smokeless Tobacco  . Never Used     Counseling given: No   Past Medical History:  Diagnosis Date  . Allergy   . Carotid artery occlusion   . Hyperlipidemia   . Hypertension   . Osteoporosis   . Thyroid disease    Past Surgical History:  Procedure Laterality Date  . ARCH AORTOGRAM N/A 07/23/2014   Procedure: ARCH AORTOGRAM;  Surgeon: Rosetta Posner, MD;  Location: [facility] CATH LAB;  Service: Cardiovascular;  Laterality: N/A;  . CAROTID ANGIOGRAM N/A 07/23/2014   Procedure: CAROTID ANGIOGRAM;  Surgeon: Rosetta Posner, MD;  Location: [facility] CATH LAB;  Service: Cardiovascular;  Laterality: N/A;  . CAROTID ENDARTERECTOMY  12/11/2008   left  . CAROTID STENT INSERTION Left 10/14/2014   Procedure: CAROTID STENT INSERTION;  Surgeon: Serafina Mitchell, MD;  Location: [facility] CATH LAB;  Service: Cardiovascular;  Laterality: Left;   carotid  . CARPAL TUNNEL RELEASE     X 2, right wrist  . COLONOSCOPY  Jan. 2, 2015  . DILATION AND CURETTAGE OF UTERUS     X 2  . laceration hand Right    hand  . THYROIDECTOMY    . TUBAL LIGATION  1975   bilateral   Family History  Problem Relation Age of Onset  . Diabetes Mother   . Colon cancer Neg Hx    History  Sexual Activity  . Sexual activity: No    Outpatient Encounter  Prescriptions as of 07/21/2016  Medication Sig  . Ascorbic Acid GRAN Take 1 tablet by mouth daily.    Marland Kitchen aspirin 81 MG tablet Take 81 mg by mouth daily.    . Calcium Carbonate-Vitamin D (CALCIUM-VITAMIN D) 600-200 MG-UNIT CAPS Take 1 tablet by mouth 2 (two) times daily.    . clopidogrel (PLAVIX) 75 MG tablet Take 1 tablet (75 mg total) by mouth daily.  . Garlic 123XX123 MG CAPS Take 1 capsule by mouth 2 (two) times daily.    Marland Kitchen loratadine (CLARITIN) 10 MG tablet Take 10 mg by mouth as needed for allergies.   . Omega-3 Fatty Acids (FISH OIL) 1000 MG CAPS Take 1 capsule (1,000 mg total) by mouth daily.  . [DISCONTINUED] hydrochlorothiazide (MICROZIDE) 12.5 MG capsule Take 1 capsule (12.5 mg total) by mouth daily.  . [DISCONTINUED] levothyroxine (SYNTHROID, LEVOTHROID) 112 MCG tablet Take 1 tablet (112 mcg total) by mouth daily.  . [DISCONTINUED] metoprolol succinate (TOPROL-XL) 25 MG 24 hr tablet Take 1 tablet (25 mg total) by mouth daily.  . [DISCONTINUED] simvastatin (ZOCOR) 40 MG tablet Take 1 tablet (40 mg total) by mouth at bedtime.   No facility-administered encounter medications on file as of 07/21/2016.     Activities of Daily Living In  your present state of health, do you have any difficulty performing the following activities: 07/21/2016  Hearing? Y  Vision? Y  Difficulty concentrating or making decisions? N  Walking or climbing stairs? N  Dressing or bathing? N  Doing errands, shopping? N  Preparing Food and eating ? N  Using the Toilet? N  In the past six months, have you accidently leaked urine? N  Do you have problems with loss of bowel control? N  Managing your Medications? N  Managing your Finances? N  Housekeeping or managing your Housekeeping? N  Some recent data might be hidden    Patient Care Team: Lucille Passy, MD as PCP - General (Family Medicine) Irene Shipper, MD as Consulting Physician (Gastroenterology) Rosetta Posner, MD as Consulting Physician (Vascular  Surgery) Serafina Mitchell, MD as Consulting Physician (Vascular Surgery)    Assessment:     Hearing Screening   125Hz  250Hz  500Hz  1000Hz  2000Hz  3000Hz  4000Hz  6000Hz  8000Hz   Right ear:   40 40 40  40    Left ear:   0 0 40  0      Visual Acuity Screening   Right eye Left eye Both eyes  Without correction:     With correction: 20/30 20/25 20/30     Exercise Activities and Dietary recommendations Current Exercise Habits: The patient has a physically strenous job, but has no regular exercise apart from work.;Home exercise routine, Type of exercise: walking (pt walks daily on job and at home when walking dog), Time (Minutes): > 60, Frequency (Times/Week): 7, Weekly Exercise (Minutes/Week): 0, Intensity: Moderate, Exercise limited by: None identified  Goals    . Increase physical activity          Starting 07/21/2016, I will continue to walk at least 60 min daily.       Fall Risk Fall Risk  07/21/2016 06/30/2015 05/19/2014 05/17/2013  Falls in the past year? No No No No   Depression Screen PHQ 2/9 Scores 07/21/2016 06/30/2015 05/19/2014 05/17/2013  PHQ - 2 Score 0 0 0 0     Cognitive Function MMSE - Mini Mental State Exam 07/21/2016  Orientation to time 5  Orientation to Place 5  Registration 3  Attention/ Calculation 0  Recall 2  Recall-comments pt was unable to recall 1 of 3 words  Language- name 2 objects 0  Language- repeat 1  Language- follow 3 step command 3  Language- read & follow direction 0  Write a sentence 0  Copy design 0  Total score 19     PLEASE NOTE: A Mini-Cog screen was completed. Maximum score is 20. A value of 0 denotes this part of Folstein MMSE was not completed or the patient failed this part of the Mini-Cog screening.   Mini-Cog Screening Orientation to Time - Max 5 pts Orientation to Place - Max 5 pts Registration - Max 3 pts Recall - Max 3 pts Language Repeat - Max 1 pts Language Follow 3 Step Command - Max 3 pts     Immunization  History  Administered Date(s) Administered  . Influenza Split 04/16/2012  . Influenza Whole 05/07/2009, 05/17/2010, 04/11/2011  . Influenza,inj,Quad PF,36+ Mos 05/17/2013, 05/19/2014, 06/30/2015, 04/22/2016  . Pneumococcal Conjugate-13 06/30/2015  . Pneumococcal Polysaccharide-23 05/19/2014  . Td 03/07/2007   Screening Tests Health Maintenance  Topic Date Due  . ZOSTAVAX  07/21/2017 (Originally 05/10/2009)  . TETANUS/TDAP  03/06/2017  . MAMMOGRAM  06/22/2018  . COLONOSCOPY  08/02/2018  . INFLUENZA VACCINE  Completed  .  DEXA SCAN  Completed  . Hepatitis C Screening  Completed  . PNA vac Low Risk Adult  Completed      Plan:     I have personally reviewed and addressed the Medicare Annual Wellness questionnaire and have noted the following in the patient's chart:  A. Medical and social history B. Use of alcohol, tobacco or illicit drugs  C. Current medications and supplements D. Functional ability and status E.  Nutritional status F.  Physical activity G. Advance directives H. List of other physicians I.  Hospitalizations, surgeries, and ER visits in previous 12 months J.  [place] to include hearing, vision, cognitive, depression L. Referrals and appointments - none  In addition, I have reviewed and discussed with patient certain preventive protocols, quality metrics, and best practice recommendations. A written personalized care plan for preventive services as well as general preventive health recommendations were provided to patient.  See attached scanned questionnaire for additional information.   Signed,   Lindell Noe, MHA, BS, LPN Health Coach

## 2016-07-21 NOTE — Progress Notes (Signed)
PCP notes:   Health maintenance:  Shingles - postponed/insurance  Abnormal screenings:   Hearing - failed Mini-Cog score: 19/20  Patient concerns:   Pt is having pain in right thumb. Pain scale: 5/10. Pt reports having hearing concerns with left ear. PCP notified.  Nurse concerns:  None  Next PCP appt:   07/22/16 @ 1100

## 2016-07-21 NOTE — Progress Notes (Signed)
I reviewed health advisor's note, was available for consultation, and agree with documentation and plan.  

## 2016-07-21 NOTE — Patient Instructions (Signed)
Gloria Murray , Thank you for taking time to come for your Medicare Wellness Visit. I appreciate your ongoing commitment to your health goals. Please review the following plan we discussed and let me know if I can assist you in the future.   These are the goals we discussed: Goals    . Increase physical activity          Starting 07/21/2016, I will continue to walk at least 60 min daily.        This is a list of the screening recommended for you and due dates:  Health Maintenance  Topic Date Due  . Shingles Vaccine  07/21/2017*  . Tetanus Vaccine  03/06/2017  . Mammogram  06/22/2018  . Colon Cancer Screening  08/02/2018  . Flu Shot  Completed  . DEXA scan (bone density measurement)  Completed  .  Hepatitis C: One time screening is recommended by Center for Disease Control  (CDC) for  adults born from [birth year] through 1967.   Completed  . Pneumonia vaccines  Completed  *Topic was postponed. The date shown is not the original due date.   Preventive Care for Adults  A healthy lifestyle and preventive care can promote health and wellness. Preventive health guidelines for adults include the following key practices.  . A routine yearly physical is a good way to check with your health care provider about your health and preventive screening. It is a chance to share any concerns and updates on your health and to receive a thorough exam.  . Visit your dentist for a routine exam and preventive care every 6 months. Brush your teeth twice a day and floss once a day. Good oral hygiene prevents tooth decay and gum disease.  . The frequency of eye exams is based on your age, health, family medical history, use  of contact lenses, and other factors. Follow your health care provider's ecommendations for frequency of eye exams.  . Eat a healthy diet. Foods like vegetables, fruits, whole grains, low-fat dairy products, and lean protein foods contain the nutrients you need without too many calories. Decrease  your intake of foods high in solid fats, added sugars, and salt. Eat the right amount of calories for you. Get information about a proper diet from your health care provider, if necessary.  . Regular physical exercise is one of the most important things you can do for your health. Most adults should get at least 150 minutes of moderate-intensity exercise (any activity that increases your heart rate and causes you to sweat) each week. In addition, most adults need muscle-strengthening exercises on 2 or more days a week.  Silver Sneakers may be a benefit available to you. To determine eligibility, you may visit the website: www.silversneakers.com or contact program at (978)143-2397 Mon-Fri between 8AM-8PM.   . Maintain a healthy weight. The body mass index (BMI) is a screening tool to identify possible weight problems. It provides an estimate of body fat based on height and weight. Your health care provider can find your BMI and can help you achieve or maintain a healthy weight.   For adults 20 years and older: ? A BMI below 18.5 is considered underweight. ? A BMI of 18.5 to 24.9 is normal. ? A BMI of 25 to 29.9 is considered overweight. ? A BMI of 30 and above is considered obese.   . Maintain normal blood lipids and cholesterol levels by exercising and minimizing your intake of saturated fat. Eat a balanced diet with  plenty of fruit and vegetables. Blood tests for lipids and cholesterol should begin at age 69 and be repeated every 5 years. If your lipid or cholesterol levels are high, you are over 50, or you are at high risk for heart disease, you may need your cholesterol levels checked more frequently. Ongoing high lipid and cholesterol levels should be treated with medicines if diet and exercise are not working.  . If you smoke, find out from your health care provider how to quit. If you do not use tobacco, please do not start.  . If you choose to drink alcohol, please do not consume more than  2 drinks per day. One drink is considered to be 12 ounces (355 mL) of beer, 5 ounces (148 mL) of wine, or 1.5 ounces (44 mL) of liquor.  . If you are 84-62 years old, ask your health care provider if you should take aspirin to prevent strokes.  . Use sunscreen. Apply sunscreen liberally and repeatedly throughout the day. You should seek shade when your shadow is shorter than you. Protect yourself by wearing long sleeves, pants, a wide-brimmed hat, and sunglasses year round, whenever you are outdoors.  . Once a month, do a whole body skin exam, using a mirror to look at the skin on your back. Tell your health care provider of new moles, moles that have irregular borders, moles that are larger than a pencil eraser, or moles that have changed in shape or color.

## 2016-07-21 NOTE — Patient Instructions (Signed)
Great to see you. Merry Christmas!

## 2016-08-30 ENCOUNTER — Encounter: Payer: Self-pay | Admitting: Cardiology

## 2017-04-28 ENCOUNTER — Ambulatory Visit (INDEPENDENT_AMBULATORY_CARE_PROVIDER_SITE_OTHER): Payer: Medicare HMO

## 2017-04-28 DIAGNOSIS — Z23 Encounter for immunization: Secondary | ICD-10-CM | POA: Diagnosis not present

## 2017-06-08 ENCOUNTER — Ambulatory Visit (HOSPITAL_COMMUNITY)
Admission: RE | Admit: 2017-06-08 | Discharge: 2017-06-08 | Disposition: A | Discharge status: Home / Self Care | Payer: Medicare HMO | Source: Ambulatory Visit | Attending: Family | Admitting: Family

## 2017-06-08 ENCOUNTER — Encounter: Payer: Self-pay | Admitting: Family

## 2017-06-08 ENCOUNTER — Ambulatory Visit: Payer: Commercial Managed Care - HMO | Admitting: Family

## 2017-06-08 VITALS — BP 155/103 | HR 102 | Temp 97.7°F | Resp 18 | Ht 65.5 in | Wt 225.8 lb

## 2017-06-08 DIAGNOSIS — Z959 Presence of cardiac and vascular implant and graft, unspecified: Secondary | ICD-10-CM | POA: Diagnosis not present

## 2017-06-08 DIAGNOSIS — I6523 Occlusion and stenosis of bilateral carotid arteries: Secondary | ICD-10-CM

## 2017-06-08 DIAGNOSIS — Z95828 Presence of other vascular implants and grafts: Secondary | ICD-10-CM | POA: Diagnosis not present

## 2017-06-08 DIAGNOSIS — I771 Stricture of artery: Secondary | ICD-10-CM

## 2017-06-08 DIAGNOSIS — Z87891 Personal history of nicotine dependence: Secondary | ICD-10-CM

## 2017-06-08 DIAGNOSIS — Z9889 Other specified postprocedural states: Secondary | ICD-10-CM

## 2017-06-08 DIAGNOSIS — I6522 Occlusion and stenosis of left carotid artery: Secondary | ICD-10-CM | POA: Diagnosis not present

## 2017-06-08 LAB — VAS US CAROTID
LCCAPDIAS: 23 cm/s
LCCAPSYS: 73 cm/s
LEFT ECA DIAS: -20 cm/s
LEFT VERTEBRAL DIAS: 20 cm/s
LICADSYS: -70 cm/s
LICAPDIAS: -28 cm/s
Left ICA dist dias: -31 cm/s
Left ICA prox sys: -61 cm/s
RCCAPSYS: 100 cm/s
RIGHT CCA MID DIAS: 22 cm/s
RIGHT ECA DIAS: -24 cm/s
Right CCA prox dias: 30 cm/s
Right cca dist sys: -68 cm/s

## 2017-06-08 MED ORDER — CLOPIDOGREL BISULFATE 75 MG PO TABS: 75.0000 mg | ORAL_TABLET | Freq: Every day | ORAL | 3 refills | Status: DC

## 2017-06-08 NOTE — Progress Notes (Signed)
Chief Complaint: Follow up Extracranial Carotid Artery Stenosis   History of Present Illness  Gloria Murray is a 66 y.o. female who returns today for follow-up of extracranial cerebrovascular occlusive disease. She is status post left carotid endarterectomy in May 2010 by Dr. Donnetta Hutching. She was found to have high-grade distal common carotid artery restenosis. She underwent uneventful left carotid stenting by Dr. Trula Slade on 10/14/14 and is done well since this. She has had no neurologic deficits.  Most importantly she quit smoking following the stenting has not resumed this.  The patient has no history of TIA or stroke symptoms.Specifically the patient denies a history of amaurosis fugax or monocular blindness, unilateral facial drooping, hemiplegia, or receptive or expressive aphasia.  She denies steal symptoms in either arm, denies dizziness.  She does lots of walking on her job.  She denies claudication symptoms with walking, denies non healing wounds, denies any cardiac problems. She also denies chest pain, denies dyspnea, denies feeling light headed. Marland Kitchen   Pt Diabetic: No Pt smoker: formersmoker, started at age 67 yrs, quit In March 2016  Pt meds include: Statin : Yes ASA: yes Other anticoagulants/antiplatelets: Plavix since the carotid stent placed March 2016   Past Medical History:  Diagnosis Date  . Allergy   . Carotid artery occlusion   . Hyperlipidemia   . Hypertension   . Osteoporosis   . Thyroid disease     Social History Social History   Tobacco Use  . Smoking status: Former Smoker    Packs/day: 0.50    Types: Cigarettes, E-cigarettes    Last attempt to quit: 07/23/2014    Years since quitting: 2.8  . Smokeless tobacco: Never Used  Substance Use Topics  . Alcohol use: No    Alcohol/week: 0.0 oz  . Drug use: No    Family History Family History  Problem Relation Age of Onset  . Diabetes Mother   . Colon cancer Neg Hx     Surgical History Past  Surgical History:  Procedure Laterality Date  . CAROTID ENDARTERECTOMY  12/11/2008   left  . CARPAL TUNNEL RELEASE     X 2, right wrist  . COLONOSCOPY  Jan. 2, 2015  . DILATION AND CURETTAGE OF UTERUS     X 2  . laceration hand Right    hand  . THYROIDECTOMY    . TUBAL LIGATION  1975   bilateral    Allergies  Allergen Reactions  . Propoxyphene Hcl Itching and Nausea Only    REACTION: nausea and itch  . Propoxyphene N-Acetaminophen Itching and Nausea Only    REACTION: nausea and  itch    Current Outpatient Medications  Medication Sig Dispense Refill  . Ascorbic Acid GRAN Take 1 tablet by mouth daily.      Marland Kitchen aspirin 81 MG tablet Take 81 mg by mouth daily.      . Calcium Carbonate-Vitamin D (CALCIUM-VITAMIN D) 600-200 MG-UNIT CAPS Take 1 tablet by mouth 2 (two) times daily.      . clopidogrel (PLAVIX) 75 MG tablet Take 1 tablet (75 mg total) by mouth daily. 90 tablet 3  . Garlic 1660 MG CAPS Take 1 capsule by mouth 2 (two) times daily.      . hydrochlorothiazide (MICROZIDE) 12.5 MG capsule Take 1 capsule (12.5 mg total) by mouth daily. 90 capsule 3  . levothyroxine (SYNTHROID, LEVOTHROID) 112 MCG tablet Take 1 tablet (112 mcg total) by mouth daily. 90 tablet 3  . loratadine (CLARITIN) 10 MG  tablet Take 10 mg by mouth as needed for allergies.     . metoprolol succinate (TOPROL-XL) 25 MG 24 hr tablet Take 1 tablet (25 mg total) by mouth daily. 90 tablet 3  . Omega-3 Fatty Acids (FISH OIL) 1000 MG CAPS Take 1 capsule (1,000 mg total) by mouth daily.    . simvastatin (ZOCOR) 40 MG tablet Take 1 tablet (40 mg total) by mouth at bedtime. 90 tablet 3   No current facility-administered medications for this visit.     Review of Systems : See HPI for pertinent positives and negatives.  Physical Examination  Vitals:   06/08/17 1056 06/08/17 1057  BP: (!) 140/98 (!) 155/103  Pulse: (!) 102   Resp: 18   Temp: 97.7 F (36.5 C)   TempSrc: Oral   SpO2: 96%   Weight: 225 lb 12.8 oz  (102.4 kg)   Height: 5' 5.5" (1.664 m)    Body mass index is 37 kg/m.  General: obese female in NAD GAIT:normal Eyes: PERRLA Pulmonary: Respirations are non-labored, CTAB, no rales, rhonchi, or wheezing.  Cardiac: regular rhythm, positive murmur.  VASCULAR EXAM Carotid Bruits Left Right   positive Positive    Abdomina aortic pulse is not palpable. Radial pulses are 2+ palpable and equal.  Bilateral DP pulses are 2+ palpable    Gastrointestinal: soft, nontender, BS WNL, no r/g,no palpable masses.  Musculoskeletal: No muscle atrophy/wasting. M/S 5/5 throughout, extremities without ischemic changes.  Skin: No ulcers, no cellulitis, no rashes.   Neurologic: A&O X 3; appropriate affect, normal sensation, speech is normal, CN 2-12 intact, Pain and light touch intact in extremities, Motor exam as listed above.    Assessment: Gloria Murray is a 66 y.o. female who is status post left carotid endarterectomy in May 2010. She was found to have high-grade distal common carotid artery restenosis. She underwent uneventful left carotid stenting by Dr. Trula Slade on 10/14/14 and is done well since this. She has had no neurologic deficits. She has no history of stroke or TIA.   Dr. Luther Parody April 2016 note: Will continue on Plavix long-term.  At pt's visit on 06/02/15, I discussed with Dr. Donnetta Hutching pt's asymptomatic 75-99% right subclavian stenosis at that time. Since she is asymptomatic there was no need to address.  She denies bleeding or bruising issues, Plavix refilled at pt request for 1 year to her [facility] mail order pharmacy. Marland Kitchen   DATA Carotid Duplex (06/08/17): 40%-59% right internal carotid artery stenosis. Left common carotid artery is patent with stent present, no hyperplasia or hemodynamically significant changes present. Left internal carotid  with less than 40% stenosis. Right vertebral artery is retrograde, left is antegrade. Right subclavian artery is stenotic, left is normal and triphasic.  Essentially unchanged since previous studies on 11/24/2014, 06/02/15, and 06/07/16.   Plan: Follow-up in 1 year with Carotid Duplex scan.   I discussed in depth with the patient the nature of atherosclerosis, and emphasized the importance of maximal medical management including strict control of blood pressure, blood glucose, and lipid levels, obtaining regular exercise, and continued cessation of smoking.  The patient is aware that without maximal medical management the underlying atherosclerotic disease process will progress, limiting the benefit of any interventions. The patient was given information about stroke prevention and what symptoms should prompt the patient to seek immediate medical care. Thank you for allowing [place] to participate in this patient's care.  Clemon Chambers, RN, MSN, FNP-C [facility] Office: [facility]  Physician: Oneida Alar  06/08/17 11:17 AM

## 2017-06-08 NOTE — Patient Instructions (Signed)

## 2017-06-29 NOTE — Addendum Note (Signed)
Addended by: Lianne Cure A on: 06/29/2017 09:06 AM   Modules accepted: Orders

## 2017-07-07 ENCOUNTER — Encounter: Payer: Self-pay | Admitting: Internal Medicine

## 2017-07-07 ENCOUNTER — Ambulatory Visit (INDEPENDENT_AMBULATORY_CARE_PROVIDER_SITE_OTHER): Payer: Medicare HMO | Admitting: Internal Medicine

## 2017-07-07 DIAGNOSIS — I6523 Occlusion and stenosis of bilateral carotid arteries: Secondary | ICD-10-CM | POA: Diagnosis not present

## 2017-07-07 DIAGNOSIS — I10 Essential (primary) hypertension: Secondary | ICD-10-CM

## 2017-07-07 DIAGNOSIS — E782 Mixed hyperlipidemia: Secondary | ICD-10-CM | POA: Diagnosis not present

## 2017-07-07 DIAGNOSIS — M810 Age-related osteoporosis without current pathological fracture: Secondary | ICD-10-CM | POA: Insufficient documentation

## 2017-07-07 DIAGNOSIS — J302 Other seasonal allergic rhinitis: Secondary | ICD-10-CM

## 2017-07-07 DIAGNOSIS — E039 Hypothyroidism, unspecified: Secondary | ICD-10-CM

## 2017-07-07 MED ORDER — SIMVASTATIN 40 MG PO TABS: 40.0000 mg | ORAL_TABLET | Freq: Every day | ORAL | 0 refills | Status: DC

## 2017-07-07 MED ORDER — METOPROLOL SUCCINATE ER 25 MG PO TB24: 25.0000 mg | ORAL_TABLET | Freq: Every day | ORAL | 0 refills | Status: DC

## 2017-07-07 MED ORDER — HYDROCHLOROTHIAZIDE 12.5 MG PO CAPS: 12.5000 mg | ORAL_CAPSULE | Freq: Every day | ORAL | 0 refills | Status: DC

## 2017-07-07 MED ORDER — LEVOTHYROXINE SODIUM 112 MCG PO TABS: 112.0000 ug | ORAL_TABLET | Freq: Every day | ORAL | 0 refills | Status: DC

## 2017-07-07 NOTE — Assessment & Plan Note (Signed)
Will check CMET and lipid profile at [facility] Wellness exam Encouraged her to consume a low fat diet Continue Simvastatin and Fish Oil, refilled today

## 2017-07-07 NOTE — Assessment & Plan Note (Signed)
Encouraged her to continue Calcium and Vit D OTC Encouraged her to continue daily weight bearing exercise

## 2017-07-07 NOTE — Assessment & Plan Note (Signed)
Following with vascular Continue Plavix

## 2017-07-07 NOTE — Assessment & Plan Note (Addendum)
Will check TSH and Free T4 at [facility] Wellness Exam Continue current dose of Synthroid at this time, refilled today

## 2017-07-07 NOTE — Assessment & Plan Note (Signed)
Controlled on Metoprolol and HCTZ, refilled today Discussed DASH diet and exercise for weight loss

## 2017-07-07 NOTE — Progress Notes (Signed)
HPI  Pt presents to the clinic today to establish care and for management of the conditions listed below. She is transferring care from Dr. Deborra Medina.  Seasonal Allergies: Worse in the spring and fall. She is currently experiencing a lot of nasal congestion and sinus pressure. She takes Claritin as needed with good relief.  Carotid Disease: Ultrasound from 06/2017 reviewed. S/p left CEA 11/2008 with subsequent restenosis, stenting 09/2014. She is taking Plavix as prescribed. She recently saw Clemon Chambers, NP 06/2017- note reviewed.  HLD: Her last LDL was 90, 06/2016. She denies myalgias on Simvastatin. She is taking Fish Oil as well. She consumes a low fat diet.  HTN: Her BP today is 126/74. She is taking Metoprolol and  HCTZ as prescribed. ECG from 05/2014 reviewed.  Osteoporosis: Bone density from 11/2008 reviewed. She is taking a Calcium and Vit D supplement OTC. She tries to get 30 minutes of weight bearing exercise daily.  Hypothyroidism: Her thyroid levels were last checked 06/2016. She denies any issues on her current dose of Synthroid.  Flu: 04/2017 Tetanus: 03/2007 Pneumovax: 05/2014 Prevnar: 06/2015 Zostovax: unsure Shingrix: unsure Mammogram: 06/2016 Pap Smear: 05/2013 Bone Density: 11/2008 Colon Screening: 08/2013 Vision Screening: as needed Dentist: as needed  Past Medical History:  Diagnosis Date  . Allergy   . Carotid artery occlusion   . Hyperlipidemia   . Hypertension   . Osteoporosis   . Thyroid disease     Current Outpatient Medications  Medication Sig Dispense Refill  . Ascorbic Acid GRAN Take 1 tablet by mouth daily.      Marland Kitchen aspirin 81 MG tablet Take 81 mg by mouth daily.      . Calcium Carbonate-Vitamin D (CALCIUM-VITAMIN D) 600-200 MG-UNIT CAPS Take 1 tablet by mouth 2 (two) times daily.      . clopidogrel (PLAVIX) 75 MG tablet Take 1 tablet (75 mg total) daily by mouth. 90 tablet 3  . Garlic 1194 MG CAPS Take 1 capsule by mouth 2 (two) times daily.      .  hydrochlorothiazide (MICROZIDE) 12.5 MG capsule Take 1 capsule (12.5 mg total) by mouth daily. 90 capsule 3  . levothyroxine (SYNTHROID, LEVOTHROID) 112 MCG tablet Take 1 tablet (112 mcg total) by mouth daily. 90 tablet 3  . loratadine (CLARITIN) 10 MG tablet Take 10 mg by mouth as needed for allergies.     . metoprolol succinate (TOPROL-XL) 25 MG 24 hr tablet Take 1 tablet (25 mg total) by mouth daily. 90 tablet 3  . Omega-3 Fatty Acids (FISH OIL) 1000 MG CAPS Take 1 capsule (1,000 mg total) by mouth daily.    . simvastatin (ZOCOR) 40 MG tablet Take 1 tablet (40 mg total) by mouth at bedtime. 90 tablet 3   No current facility-administered medications for this visit.     Allergies  Allergen Reactions  . Propoxyphene Hcl Itching and Nausea Only    REACTION: nausea and itch  . Propoxyphene N-Acetaminophen Itching and Nausea Only    REACTION: nausea and  itch    Family History  Problem Relation Age of Onset  . Diabetes Mother   . Colon cancer Neg Hx     Social History   Socioeconomic History  . Marital status: Widowed    Spouse name: Not on file  . Number of children: Not on file  . Years of education: Not on file  . Highest education level: Not on file  Social Needs  . Financial resource strain: Not on file  . Food  insecurity - worry: Not on file  . Food insecurity - inability: Not on file  . Transportation needs - medical: Not on file  . Transportation needs - non-medical: Not on file  Occupational History  . Occupation: [employer]    Employer: [employer]  Tobacco Use  . Smoking status: Former Smoker    Packs/day: 0.50    Types: Cigarettes, E-cigarettes    Last attempt to quit: 07/23/2014    Years since quitting: 2.9  . Smokeless tobacco: Never Used  Substance and Sexual Activity  . Alcohol use: No    Alcohol/week: 0.0 oz  . Drug use: No  . Sexual activity: No  Other Topics Concern  . Not on file  Social History Narrative   Does not have a living will.   Does not  have any family.      [name] would "make decisions" for her-desires CPR, does not want prolonged life support if futile.      Both of her children- died in car accident at ages 69 and 56- husband went into a diabetic coma while driving.    ROS:  Constitutional: Denies fever, malaise, fatigue, headache or abrupt weight changes.  HEENT: Pt reports sinus pressure and nasal congestion. Denies eye pain, eye redness, ear pain, ringing in the ears, wax buildup, runny nose, bloody nose, or sore throat. Respiratory: Denies difficulty breathing, shortness of breath, cough or sputum production.   Cardiovascular: Denies chest pain, chest tightness, palpitations or swelling in the hands or feet.  Gastrointestinal: Denies abdominal pain, bloating, constipation, diarrhea or blood in the stool.  GU: Denies frequency, urgency, pain with urination, blood in urine, odor or discharge. Musculoskeletal: Denies decrease in range of motion, difficulty with gait, muscle pain or joint pain and swelling.  Skin: Denies redness, rashes, lesions or ulcercations.  Neurological: Denies dizziness, difficulty with memory, difficulty with speech or problems with balance and coordination.  Psych: Denies anxiety, depression, SI/HI.  No other specific complaints in a complete review of systems (except as listed in HPI above).  PE:  BP 126/74   Wt 229 lb (103.9 kg)   BMI 37.53 kg/m   Wt Readings from Last 3 Encounters:  06/08/17 225 lb 12.8 oz (102.4 kg)  07/21/16 204 lb 8 oz (92.8 kg)  07/21/16 204 lb 8 oz (92.8 kg)    General: Appears her stated age, obese in NAD. HEENT: Head: normal shape and size, no sinus tenderness noted; Eyes: sclera white, no icterus, conjunctiva pink, PERRLA and EOMs intact; Ears: Tm's gray and intact, normal light reflex; Throat/Mouth: Teeth present, mucosa pink and moist, no lesions or ulcerations noted.  Neck: Neck supple, trachea midline. No masses, lumps present.  Cardiovascular:  Normal rate and rhythm. S1,S2 noted.  Murmur noted. No JVD or BLE edema. Bilateral carotid bruits noted. Pulmonary/Chest: Normal effort and positive vesicular breath sounds. No respiratory distress. No wheezes, rales or ronchi noted.  Neurological: Alert and oriented. Cranial nerves II-XII grossly intact. Coordination normal.  Psychiatric: Mood and affect normal. Behavior is normal. Judgment and thought content normal.     BMET    Component Value Date/Time   NA 142 06/07/2016 1022   K 3.6 06/07/2016 1022   CL 105 06/07/2016 1022   CO2 32 06/07/2016 1022   GLUCOSE 116 (H) 06/07/2016 1022   BUN 15 06/07/2016 1022   CREATININE 0.71 06/07/2016 1022   CALCIUM 9.7 06/07/2016 1022   GFRNONAA 77.65 10/12/2009 0904   GFRAA  12/12/2008 0440    >  60        The eGFR has been calculated using the MDRD equation. This calculation has not been validated in all clinical situations. eGFR's persistently <60 mL/min signify possible Chronic Kidney Disease.    Lipid Panel     Component Value Date/Time   CHOL 159 06/07/2016 1022   TRIG 155.0 (H) 06/07/2016 1022   HDL 37.70 (L) 06/07/2016 1022   CHOLHDL 4 06/07/2016 1022   VLDL 31.0 06/07/2016 1022   LDLCALC 90 06/07/2016 1022    CBC    Component Value Date/Time   WBC 4.2 06/07/2016 1022   RBC 4.83 06/07/2016 1022   HGB 14.9 06/07/2016 1022   HCT 43.6 06/07/2016 1022   PLT 320.0 06/07/2016 1022   MCV 90.3 06/07/2016 1022   MCHC 34.1 06/07/2016 1022   RDW 13.2 06/07/2016 1022   LYMPHSABS 1.3 06/07/2016 1022   MONOABS 0.3 06/07/2016 1022   EOSABS 0.1 06/07/2016 1022   BASOSABS 0.0 06/07/2016 1022    Hgb A1C Lab Results  Component Value Date   HGBA1C 6.1 05/19/2014     Assessment and Plan:

## 2017-07-07 NOTE — Patient Instructions (Signed)
Fat and Cholesterol Restricted Diet Getting too much fat and cholesterol in your diet may cause health problems. Following this diet helps keep your fat and cholesterol at normal levels. This can keep you from getting sick. What types of fat should I choose?  Choose monosaturated and polyunsaturated fats. These are found in foods such as olive oil, canola oil, flaxseeds, walnuts, almonds, and seeds.  Eat more omega-3 fats. Good choices include salmon, mackerel, sardines, tuna, flaxseed oil, and ground flaxseeds.  Limit saturated fats. These are in animal products such as meats, butter, and cream. They can also be in plant products such as palm oil, palm kernel oil, and coconut oil.  Avoid foods with partially hydrogenated oils in them. These contain trans fats. Examples of foods that have trans fats are stick margarine, some tub margarines, cookies, crackers, and other baked goods. What general guidelines do I need to follow?  Check food labels. Look for the words "trans fat" and "saturated fat."  When preparing a meal: ? Fill half of your plate with vegetables and green salads. ? Fill one fourth of your plate with whole grains. Look for the word "whole" as the first word in the ingredient list. ? Fill one fourth of your plate with lean protein foods.  Eat more foods that have fiber, like apples, carrots, beans, peas, and barley.  Eat more home-cooked foods. Eat less at restaurants and buffets.  Limit or avoid alcohol.  Limit foods high in starch and sugar.  Limit fried foods.  Cook foods without frying them. Baking, boiling, grilling, and broiling are all great options.  Lose weight if you are overweight. Losing even a small amount of weight can help your overall health. It can also help prevent diseases such as diabetes and heart disease. What foods can I eat? Grains Whole grains, such as whole wheat or whole grain breads, crackers, cereals, and pasta. Unsweetened oatmeal,  bulgur, barley, quinoa, or brown rice. Corn or whole wheat flour tortillas. Vegetables Fresh or frozen vegetables (raw, steamed, roasted, or grilled). Green salads. Fruits All fresh, canned (in natural juice), or frozen fruits. Meat and Other Protein Products Ground beef (85% or leaner), grass-fed beef, or beef trimmed of fat. Skinless chicken or turkey. Ground chicken or turkey. Pork trimmed of fat. All fish and seafood. Eggs. Dried beans, peas, or lentils. Unsalted nuts or seeds. Unsalted canned or dry beans. Dairy Low-fat dairy products, such as skim or 1% milk, 2% or reduced-fat cheeses, low-fat ricotta or cottage cheese, or plain low-fat yogurt. Fats and Oils Tub margarines without trans fats. Light or reduced-fat mayonnaise and salad dressings. Avocado. Olive, canola, sesame, or safflower oils. Natural peanut or almond butter (choose ones without added sugar and oil). The items listed above may not be a complete list of recommended foods or beverages. Contact your dietitian for more options. What foods are not recommended? Grains White bread. White pasta. White rice. Cornbread. Bagels, pastries, and croissants. Crackers that contain trans fat. Vegetables White potatoes. Corn. Creamed or fried vegetables. Vegetables in a cheese sauce. Fruits Dried fruits. Canned fruit in light or heavy syrup. Fruit juice. Meat and Other Protein Products Fatty cuts of meat. Ribs, chicken wings, bacon, sausage, bologna, salami, chitterlings, fatback, hot dogs, bratwurst, and packaged luncheon meats. Liver and organ meats. Dairy Whole or 2% milk, cream, half-and-half, and cream cheese. Whole milk cheeses. Whole-fat or sweetened yogurt. Full-fat cheeses. Nondairy creamers and whipped toppings. Processed cheese, cheese spreads, or cheese curds. Sweets and Desserts Corn   syrup, sugars, honey, and molasses. Candy. Jam and jelly. Syrup. Sweetened cereals. Cookies, pies, cakes, donuts, muffins, and ice  cream. Fats and Oils Butter, stick margarine, lard, shortening, ghee, or bacon fat. Coconut, palm kernel, or palm oils. Beverages Alcohol. Sweetened drinks (such as sodas, lemonade, and fruit drinks or punches). The items listed above may not be a complete list of foods and beverages to avoid. Contact your dietitian for more information. This information is not intended to replace advice given to you by your health care provider. Make sure you discuss any questions you have with your health care provider. Document Released: 01/17/2012 Document Revised: 03/24/2016 Document Reviewed: 10/17/2013 Elsevier Interactive Patient Education  2018 [employer]  

## 2017-07-07 NOTE — Assessment & Plan Note (Signed)
Continue Claritin Advised her to add Flonase to her regimen for better control of symptoms Will monitor

## 2017-08-18 ENCOUNTER — Encounter: Payer: Medicare HMO | Admitting: Internal Medicine

## 2017-10-06 ENCOUNTER — Encounter: Payer: Self-pay | Admitting: Internal Medicine

## 2017-10-06 ENCOUNTER — Ambulatory Visit (INDEPENDENT_AMBULATORY_CARE_PROVIDER_SITE_OTHER): Payer: Medicare HMO | Admitting: Internal Medicine

## 2017-10-06 VITALS — BP 124/86 | HR 100 | Temp 98.2°F | Ht 66.0 in | Wt 230.0 lb

## 2017-10-06 DIAGNOSIS — E890 Postprocedural hypothyroidism: Secondary | ICD-10-CM | POA: Diagnosis not present

## 2017-10-06 DIAGNOSIS — M810 Age-related osteoporosis without current pathological fracture: Secondary | ICD-10-CM

## 2017-10-06 DIAGNOSIS — Z1239 Encounter for other screening for malignant neoplasm of breast: Secondary | ICD-10-CM

## 2017-10-06 DIAGNOSIS — E7800 Pure hypercholesterolemia, unspecified: Secondary | ICD-10-CM

## 2017-10-06 DIAGNOSIS — Z1231 Encounter for screening mammogram for malignant neoplasm of breast: Secondary | ICD-10-CM

## 2017-10-06 DIAGNOSIS — Z0000 Encounter for general adult medical examination without abnormal findings: Secondary | ICD-10-CM | POA: Diagnosis not present

## 2017-10-06 DIAGNOSIS — L989 Disorder of the skin and subcutaneous tissue, unspecified: Secondary | ICD-10-CM

## 2017-10-06 DIAGNOSIS — I10 Essential (primary) hypertension: Secondary | ICD-10-CM | POA: Diagnosis not present

## 2017-10-06 MED ORDER — LEVOTHYROXINE SODIUM 112 MCG PO TABS: 112.0000 ug | ORAL_TABLET | Freq: Every day | ORAL | 3 refills | Status: DC

## 2017-10-06 MED ORDER — METOPROLOL SUCCINATE ER 25 MG PO TB24: 25.0000 mg | ORAL_TABLET | Freq: Every day | ORAL | 3 refills | Status: DC

## 2017-10-06 MED ORDER — CLOPIDOGREL BISULFATE 75 MG PO TABS: 75.0000 mg | ORAL_TABLET | Freq: Every day | ORAL | 3 refills | Status: DC

## 2017-10-06 MED ORDER — HYDROCHLOROTHIAZIDE 12.5 MG PO CAPS: 12.5000 mg | ORAL_CAPSULE | Freq: Every day | ORAL | 3 refills | Status: DC

## 2017-10-06 MED ORDER — SIMVASTATIN 40 MG PO TABS: 40.0000 mg | ORAL_TABLET | Freq: Every day | ORAL | 3 refills | Status: DC

## 2017-10-06 NOTE — Patient Instructions (Signed)
Health Maintenance for Postmenopausal Women Menopause is a normal process in which your reproductive ability comes to an end. This process happens gradually over a span of months to years, usually between the ages of 22 and 9. Menopause is complete when you have missed 12 consecutive menstrual periods. It is important to talk with your health care provider about some of the most common conditions that affect postmenopausal women, such as heart disease, cancer, and bone loss (osteoporosis). Adopting a healthy lifestyle and getting preventive care can help to promote your health and wellness. Those actions can also lower your chances of developing some of these common conditions. What should I know about menopause? During menopause, you may experience a number of symptoms, such as:  Moderate-to-severe hot flashes.  Night sweats.  Decrease in sex drive.  Mood swings.  Headaches.  Tiredness.  Irritability.  Memory problems.  Insomnia.  Choosing to treat or not to treat menopausal changes is an individual decision that you make with your health care provider. What should I know about hormone replacement therapy and supplements? Hormone therapy products are effective for treating symptoms that are associated with menopause, such as hot flashes and night sweats. Hormone replacement carries certain risks, especially as you become older. If you are thinking about using estrogen or estrogen with progestin treatments, discuss the benefits and risks with your health care provider. What should I know about heart disease and stroke? Heart disease, heart attack, and stroke become more likely as you age. This may be due, in part, to the hormonal changes that your body experiences during menopause. These can affect how your body processes dietary fats, triglycerides, and cholesterol. Heart attack and stroke are both medical emergencies. There are many things that you can do to help prevent heart disease  and stroke:  Have your blood pressure checked at least every 1-2 years. High blood pressure causes heart disease and increases the risk of stroke.  If you are 53-22 years old, ask your health care provider if you should take aspirin to prevent a heart attack or a stroke.  Do not use any tobacco products, including cigarettes, chewing tobacco, or electronic cigarettes. If you need help quitting, ask your health care provider.  It is important to eat a healthy diet and maintain a healthy weight. ? Be sure to include plenty of vegetables, fruits, low-fat dairy products, and lean protein. ? Avoid eating foods that are high in solid fats, added sugars, or salt (sodium).  Get regular exercise. This is one of the most important things that you can do for your health. ? Try to exercise for at least 150 minutes each week. The type of exercise that you do should increase your heart rate and make you sweat. This is known as moderate-intensity exercise. ? Try to do strengthening exercises at least twice each week. Do these in addition to the moderate-intensity exercise.  Know your numbers.Ask your health care provider to check your cholesterol and your blood glucose. Continue to have your blood tested as directed by your health care provider.  What should I know about cancer screening? There are several types of cancer. Take the following steps to reduce your risk and to catch any cancer development as early as possible. Breast Cancer  Practice breast self-awareness. ? This means understanding how your breasts normally appear and feel. ? It also means doing regular breast self-exams. Let your health care provider know about any changes, no matter how small.  If you are 40  or older, have a clinician do a breast exam (clinical breast exam or CBE) every year. Depending on your age, family history, and medical history, it may be recommended that you also have a yearly breast X-ray (mammogram).  If you  have a family history of breast cancer, talk with your health care provider about genetic screening.  If you are at high risk for breast cancer, talk with your health care provider about having an MRI and a mammogram every year.  Breast cancer (BRCA) gene test is recommended for women who have family members with BRCA-related cancers. Results of the assessment will determine the need for genetic counseling and BRCA1 and for BRCA2 testing. BRCA-related cancers include these types: ? Breast. This occurs in males or females. ? Ovarian. ? Tubal. This may also be called fallopian tube cancer. ? Cancer of the abdominal or pelvic lining (peritoneal cancer). ? Prostate. ? Pancreatic.  Cervical, Uterine, and Ovarian Cancer Your health care provider may recommend that you be screened regularly for cancer of the pelvic organs. These include your ovaries, uterus, and vagina. This screening involves a pelvic exam, which includes checking for microscopic changes to the surface of your cervix (Pap test).  For women ages 21-65, health care providers may recommend a pelvic exam and a Pap test every three years. For women ages 79-65, they may recommend the Pap test and pelvic exam, combined with testing for human papilloma virus (HPV), every five years. Some types of HPV increase your risk of cervical cancer. Testing for HPV may also be done on women of any age who have unclear Pap test results.  Other health care providers may not recommend any screening for nonpregnant women who are considered low risk for pelvic cancer and have no symptoms. Ask your health care provider if a screening pelvic exam is right for you.  If you have had past treatment for cervical cancer or a condition that could lead to cancer, you need Pap tests and screening for cancer for at least 20 years after your treatment. If Pap tests have been discontinued for you, your risk factors (such as having a new sexual partner) need to be  reassessed to determine if you should start having screenings again. Some women have medical problems that increase the chance of getting cervical cancer. In these cases, your health care provider may recommend that you have screening and Pap tests more often.  If you have a family history of uterine cancer or ovarian cancer, talk with your health care provider about genetic screening.  If you have vaginal bleeding after reaching menopause, tell your health care provider.  There are currently no reliable tests available to screen for ovarian cancer.  Lung Cancer Lung cancer screening is recommended for adults 69-62 years old who are at high risk for lung cancer because of a history of smoking. A yearly low-dose CT scan of the lungs is recommended if you:  Currently smoke.  Have a history of at least 30 pack-years of smoking and you currently smoke or have quit within the past 15 years. A pack-year is smoking an average of one pack of cigarettes per day for one year.  Yearly screening should:  Continue until it has been 15 years since you quit.  Stop if you develop a health problem that would prevent you from having lung cancer treatment.  Colorectal Cancer  This type of cancer can be detected and can often be prevented.  Routine colorectal cancer screening usually begins at  age 42 and continues through age 45.  If you have risk factors for colon cancer, your health care provider may recommend that you be screened at an earlier age.  If you have a family history of colorectal cancer, talk with your health care provider about genetic screening.  Your health care provider may also recommend using home test kits to check for hidden blood in your stool.  A small camera at the end of a tube can be used to examine your colon directly (sigmoidoscopy or colonoscopy). This is done to check for the earliest forms of colorectal cancer.  Direct examination of the colon should be repeated every  5-10 years until age 71. However, if early forms of precancerous polyps or small growths are found or if you have a family history or genetic risk for colorectal cancer, you may need to be screened more often.  Skin Cancer  Check your skin from head to toe regularly.  Monitor any moles. Be sure to tell your health care provider: ? About any new moles or changes in moles, especially if there is a change in a mole's shape or color. ? If you have a mole that is larger than the size of a pencil eraser.  If any of your family members has a history of skin cancer, especially at a young age, talk with your health care provider about genetic screening.  Always use sunscreen. Apply sunscreen liberally and repeatedly throughout the day.  Whenever you are outside, protect yourself by wearing long sleeves, pants, a wide-brimmed hat, and sunglasses.  What should I know about osteoporosis? Osteoporosis is a condition in which bone destruction happens more quickly than new bone creation. After menopause, you may be at an increased risk for osteoporosis. To help prevent osteoporosis or the bone fractures that can happen because of osteoporosis, the following is recommended:  If you are 46-71 years old, get at least 1,000 mg of calcium and at least 600 mg of vitamin D per day.  If you are older than age 55 but younger than age 65, get at least 1,200 mg of calcium and at least 600 mg of vitamin D per day.  If you are older than age 54, get at least 1,200 mg of calcium and at least 800 mg of vitamin D per day.  Smoking and excessive alcohol intake increase the risk of osteoporosis. Eat foods that are rich in calcium and vitamin D, and do weight-bearing exercises several times each week as directed by your health care provider. What should I know about how menopause affects my mental health? Depression may occur at any age, but it is more common as you become older. Common symptoms of depression  include:  Low or sad mood.  Changes in sleep patterns.  Changes in appetite or eating patterns.  Feeling an overall lack of motivation or enjoyment of activities that you previously enjoyed.  Frequent crying spells.  Talk with your health care provider if you think that you are experiencing depression. What should I know about immunizations? It is important that you get and maintain your immunizations. These include:  Tetanus, diphtheria, and pertussis (Tdap) booster vaccine.  Influenza every year before the flu season begins.  Pneumonia vaccine.  Shingles vaccine.  Your health care provider may also recommend other immunizations. This information is not intended to replace advice given to you by your health care provider. Make sure you discuss any questions you have with your health care provider. Document Released: 09/09/2005  Document Revised: 02/05/2016 Document Reviewed: 04/21/2015 Elsevier Interactive Patient Education  [employer].

## 2017-10-06 NOTE — Progress Notes (Signed)
HPI:  Pt presents to the clinic today for her Medicare Wellness Exam  Past Medical History:  Diagnosis Date  . Allergy   . Carotid artery occlusion   . Hyperlipidemia   . Hypertension   . Osteoporosis   . Thyroid disease     Current Outpatient Medications  Medication Sig Dispense Refill  . Ascorbic Acid GRAN Take 1 tablet by mouth daily.      Marland Kitchen aspirin 81 MG tablet Take 81 mg by mouth daily.      . Calcium Carbonate-Vitamin D (CALCIUM-VITAMIN D) 600-200 MG-UNIT CAPS Take 1 tablet by mouth 2 (two) times daily.      . clopidogrel (PLAVIX) 75 MG tablet Take 1 tablet (75 mg total) daily by mouth. 90 tablet 3  . Garlic 2536 MG CAPS Take 1 capsule by mouth 2 (two) times daily.      . hydrochlorothiazide (MICROZIDE) 12.5 MG capsule Take 1 capsule (12.5 mg total) by mouth daily. 90 capsule 0  . levothyroxine (SYNTHROID, LEVOTHROID) 112 MCG tablet Take 1 tablet (112 mcg total) by mouth daily. 90 tablet 0  . loratadine (CLARITIN) 10 MG tablet Take 10 mg by mouth as needed for allergies.     . metoprolol succinate (TOPROL-XL) 25 MG 24 hr tablet Take 1 tablet (25 mg total) by mouth daily. 90 tablet 0  . Omega-3 Fatty Acids (FISH OIL) 1000 MG CAPS Take 1 capsule (1,000 mg total) by mouth daily.    . simvastatin (ZOCOR) 40 MG tablet Take 1 tablet (40 mg total) by mouth at bedtime. 90 tablet 0   No current facility-administered medications for this visit.     Allergies  Allergen Reactions  . Propoxyphene Hcl Itching and Nausea Only    REACTION: nausea and itch  . Propoxyphene N-Acetaminophen Itching and Nausea Only    REACTION: nausea and  itch    Family History  Problem Relation Age of Onset  . Diabetes Mother   . Colon cancer Neg Hx     Social History   Socioeconomic History  . Marital status: Widowed    Spouse name: Not on file  . Number of children: Not on file  . Years of education: Not on file  . Highest education level: Not on file  Social Needs  . Financial resource  strain: Not on file  . Food insecurity - worry: Not on file  . Food insecurity - inability: Not on file  . Transportation needs - medical: Not on file  . Transportation needs - non-medical: Not on file  Occupational History  . Occupation: [employer]    Employer: [employer]  Tobacco Use  . Smoking status: Former Smoker    Packs/day: 0.50    Types: Cigarettes, E-cigarettes    Last attempt to quit: 07/23/2014    Years since quitting: 3.2  . Smokeless tobacco: Never Used  Substance and Sexual Activity  . Alcohol use: No    Alcohol/week: 0.0 oz  . Drug use: No  . Sexual activity: No  Other Topics Concern  . Not on file  Social History Narrative   Does not have a living will.   Does not have any family.      [name] would "make decisions" for her-desires CPR, does not want prolonged life support if futile.      Both of her children- died in car accident at ages 90 and 43- husband went into a diabetic coma while driving.    Hospitiliaztions: None  Health Maintenance:  Flu: 04/2017  Tetanus: 03/2007  Pneumovax: 05/2014  Prevnar: 06/2015  Zostavax: never  Mammogram: 06/2016  Pap Smear: 05/2015  Bone Density: 11/2008  Colon Screening: 08/2015  Eye Doctor: as needed  Dental Exam: as needed   Providers:   PCP: Webb Silversmith, NP-C  Vascular: Nickel, NP  Cardiologist: Dr. Donnetta Hutching     I have personally reviewed and have noted:  1. The patient's medical and social history 2. Their use of alcohol, tobacco or illicit drugs 3. Their current medications and supplements 4. The patient's functional ability including ADL's, fall risks, home safety risks and hearing or visual impairment. 5. Diet and physical activities 6. Evidence for depression or mood disorder  Subjective:   Review of Systems:   Constitutional: Denies fever, malaise, fatigue, headache or abrupt weight changes.  HEENT: Denies eye pain, eye redness, ear pain, ringing in the ears, wax buildup, runny nose,  nasal congestion, bloody nose, or sore throat. Respiratory: Denies difficulty breathing, shortness of breath, cough or sputum production.   Cardiovascular: Denies chest pain, chest tightness, palpitations or swelling in the hands or feet.  Gastrointestinal: Denies abdominal pain, bloating, constipation, diarrhea or blood in the stool.  GU: Denies urgency, frequency, pain with urination, burning sensation, blood in urine, odor or discharge. Musculoskeletal: Denies decrease in range of motion, difficulty with gait, muscle pain or joint pain and swelling.  Skin: Pt reports skin lesion of hand. Denies redness, rashes, lesions or ulcercations.  Neurological: Denies dizziness, difficulty with memory, difficulty with speech or problems with balance and coordination.  Psych: Denies anxiety, depression, SI/HI.  No other specific complaints in a complete review of systems (except as listed in HPI above).  Objective:  PE:  BP 124/86   Pulse 100   Temp 98.2 F (36.8 C) (Oral)   Ht '5\' 6"'$  (1.676 m)   Wt 230 lb (104.3 kg)   SpO2 96%   BMI 37.12 kg/m   Wt Readings from Last 3 Encounters:  07/07/17 229 lb (103.9 kg)  06/08/17 225 lb 12.8 oz (102.4 kg)  07/21/16 204 lb 8 oz (92.8 kg)    General: Appears her stated age, obese in NAD. Skin: Warm, dry and intact. 2 raised, scaly lesions on erythematous base noted of posterior right hand. Neck: Neck supple, trachea midline. No masses, lumps present. Surgical incision noted over anterior neck. Cardiovascular: Normal rate and rhythm. S1,S2 noted.  Murmur noted. No JVD or BLE edema. Bilateral carotid bruits noted. Pulmonary/Chest: Normal effort and positive vesicular breath sounds. No respiratory distress. No wheezes, rales or ronchi noted.  Abdomen: Soft and nontender. Normal bowel sounds. No distention or masses noted.  Musculoskeletal: Strength 5/5 BUE/BLE. No difficulty with gait. Neurological: Alert and oriented. Psychiatric: Mood and affect  normal. Behavior is normal. Judgment and thought content normal.     BMET    Component Value Date/Time   NA 142 06/07/2016 1022   K 3.6 06/07/2016 1022   CL 105 06/07/2016 1022   CO2 32 06/07/2016 1022   GLUCOSE 116 (H) 06/07/2016 1022   BUN 15 06/07/2016 1022   CREATININE 0.71 06/07/2016 1022   CALCIUM 9.7 06/07/2016 1022   GFRNONAA 77.65 10/12/2009 0904   GFRAA  12/12/2008 0440    >60        The eGFR has been calculated using the MDRD equation. This calculation has not been validated in all clinical situations. eGFR's persistently <60 mL/min signify possible Chronic Kidney Disease.    Lipid Panel  Component Value Date/Time   CHOL 159 06/07/2016 1022   TRIG 155.0 (H) 06/07/2016 1022   HDL 37.70 (L) 06/07/2016 1022   CHOLHDL 4 06/07/2016 1022   VLDL 31.0 06/07/2016 1022   LDLCALC 90 06/07/2016 1022    CBC    Component Value Date/Time   WBC 4.2 06/07/2016 1022   RBC 4.83 06/07/2016 1022   HGB 14.9 06/07/2016 1022   HCT 43.6 06/07/2016 1022   PLT 320.0 06/07/2016 1022   MCV 90.3 06/07/2016 1022   MCHC 34.1 06/07/2016 1022   RDW 13.2 06/07/2016 1022   LYMPHSABS 1.3 06/07/2016 1022   MONOABS 0.3 06/07/2016 1022   EOSABS 0.1 06/07/2016 1022   BASOSABS 0.0 06/07/2016 1022    Hgb A1C Lab Results  Component Value Date   HGBA1C 6.1 05/19/2014      Assessment and Plan:   Medicare Annual Wellness Visit:  Diet: She does eat meat. She consumes fruits and veggies. She occasionally eats fried foods. She drinks mostly coffee, tea and water. Physical activity:  Walking the dog. Depression/mood screen: Negative Hearing: Trouble hearing out of her left ear. Visual acuity: Grossly normal ADLs: Capable Fall risk: None Home safety: Good Cognitive evaluation: Intact to orientation, naming, recall and repetition EOL planning: No adv directives, full code/ I agree  Preventative Medicine: Flu, prevnar, pneumovax UTD. She declines tetanus vaccine. She will get  her shingles vaccine at the pharmacy. Mammogram and bone density ordered.Pap smear and colon screening UTD. Encouraged her to consume a balanced diet and exercise regimen. Advised her to see an eye doctor and dentist annually. Will check CBC, CMET, Lipid, TSH, Free T4 and Vit D today.  Skin Lesion of Right Hand:  Referral to derm for further evaluation  Next appointment: 1 year, Medicare Wellness Exam   Webb Silversmith, NP

## 2017-10-07 LAB — COMPREHENSIVE METABOLIC PANEL
AG Ratio: 1.9 (calc) (ref 1.0–2.5)
ALT: 18 U/L (ref 6–29)
AST: 16 U/L (ref 10–35)
Albumin: 4.3 g/dL (ref 3.6–5.1)
Alkaline phosphatase (APISO): 63 U/L (ref 33–130)
BUN: 16 mg/dL (ref 7–25)
CHLORIDE: 106 mmol/L (ref 98–110)
CO2: 30 mmol/L (ref 20–32)
CREATININE: 0.89 mg/dL (ref 0.50–0.99)
Calcium: 9.9 mg/dL (ref 8.6–10.4)
GLUCOSE: 121 mg/dL — AB (ref 65–99)
Globulin: 2.3 g/dL (calc) (ref 1.9–3.7)
Potassium: 5 mmol/L (ref 3.5–5.3)
Sodium: 144 mmol/L (ref 135–146)
Total Bilirubin: 0.4 mg/dL (ref 0.2–1.2)
Total Protein: 6.6 g/dL (ref 6.1–8.1)

## 2017-10-07 LAB — CBC
HCT: 45.1 % — ABNORMAL HIGH (ref 35.0–45.0)
Hemoglobin: 15.2 g/dL (ref 11.7–15.5)
MCH: 30.3 pg (ref 27.0–33.0)
MCHC: 33.7 g/dL (ref 32.0–36.0)
MCV: 89.8 fL (ref 80.0–100.0)
MPV: 10.9 fL (ref 7.5–12.5)
PLATELETS: 285 10*3/uL (ref 140–400)
RBC: 5.02 10*6/uL (ref 3.80–5.10)
RDW: 12.5 % (ref 11.0–15.0)
WBC: 4.2 10*3/uL (ref 3.8–10.8)

## 2017-10-07 LAB — LIPID PANEL
CHOLESTEROL: 153 mg/dL (ref <200)
HDL: 36 mg/dL — AB (ref >50)
LDL CHOLESTEROL (CALC): 89 mg/dL
Non-HDL Cholesterol (Calc): 117 mg/dL (calc) (ref <130)
TRIGLYCERIDES: 179 mg/dL — AB (ref <150)
Total CHOL/HDL Ratio: 4.3 (calc) (ref <5.0)

## 2017-10-07 LAB — VITAMIN D 25 HYDROXY (VIT D DEFICIENCY, FRACTURES): Vit D, 25-Hydroxy: 7 ng/mL — ABNORMAL LOW (ref 30–100)

## 2017-10-07 LAB — TSH: TSH: 2.94 mIU/L (ref 0.40–4.50)

## 2017-10-07 LAB — T4, FREE: FREE T4: 1.6 ng/dL (ref 0.8–1.8)

## 2017-10-09 ENCOUNTER — Other Ambulatory Visit: Payer: Self-pay | Admitting: Internal Medicine

## 2017-10-09 DIAGNOSIS — Z1231 Encounter for screening mammogram for malignant neoplasm of breast: Secondary | ICD-10-CM

## 2017-10-09 DIAGNOSIS — E559 Vitamin D deficiency, unspecified: Secondary | ICD-10-CM

## 2017-10-09 MED ORDER — VITAMIN D (ERGOCALCIFEROL) 1.25 MG (50000 UNIT) PO CAPS: 50000.0000 [IU] | ORAL_CAPSULE | ORAL | 0 refills | Status: DC

## 2017-10-09 NOTE — Progress Notes (Signed)
erg ?

## 2017-10-17 ENCOUNTER — Telehealth: Payer: Self-pay

## 2017-10-17 NOTE — Telephone Encounter (Signed)
Patient is returning call. Please advise? 

## 2017-10-17 NOTE — Telephone Encounter (Signed)
left message for patient to call back to Gloria Murray in regards to a referral-

## 2017-10-27 DIAGNOSIS — D225 Melanocytic nevi of trunk: Secondary | ICD-10-CM | POA: Diagnosis not present

## 2017-10-27 DIAGNOSIS — L570 Actinic keratosis: Secondary | ICD-10-CM | POA: Diagnosis not present

## 2017-10-27 DIAGNOSIS — X32XXXA Exposure to sunlight, initial encounter: Secondary | ICD-10-CM | POA: Diagnosis not present

## 2017-10-27 DIAGNOSIS — B078 Other viral warts: Secondary | ICD-10-CM | POA: Diagnosis not present

## 2017-11-03 ENCOUNTER — Ambulatory Visit
Admission: RE | Admit: 2017-11-03 | Discharge: 2017-11-03 | Disposition: A | Discharge status: Home / Self Care | Payer: Medicare HMO | Source: Ambulatory Visit | Attending: Internal Medicine | Admitting: Internal Medicine

## 2017-11-03 DIAGNOSIS — Z1231 Encounter for screening mammogram for malignant neoplasm of breast: Secondary | ICD-10-CM

## 2017-11-03 DIAGNOSIS — Z780 Asymptomatic menopausal state: Secondary | ICD-10-CM | POA: Diagnosis not present

## 2017-11-03 DIAGNOSIS — M85851 Other specified disorders of bone density and structure, right thigh: Secondary | ICD-10-CM | POA: Diagnosis not present

## 2017-11-03 DIAGNOSIS — M810 Age-related osteoporosis without current pathological fracture: Secondary | ICD-10-CM

## 2018-06-13 ENCOUNTER — Telehealth (HOSPITAL_COMMUNITY): Payer: Self-pay | Admitting: Surgery

## 2018-06-13 NOTE — Telephone Encounter (Signed)
06/13/2018 10:50 am [place], RVT attempted to contact patient to confirm appointments on 06/14/2018

## 2018-06-14 ENCOUNTER — Other Ambulatory Visit: Payer: Self-pay

## 2018-06-14 ENCOUNTER — Ambulatory Visit (HOSPITAL_COMMUNITY)
Admission: RE | Admit: 2018-06-14 | Discharge: 2018-06-14 | Disposition: A | Discharge status: Home / Self Care | Payer: Medicare HMO | Source: Ambulatory Visit | Attending: Family | Admitting: Family

## 2018-06-14 ENCOUNTER — Ambulatory Visit: Payer: Medicare HMO | Admitting: Family

## 2018-06-14 ENCOUNTER — Encounter: Payer: Self-pay | Admitting: Family

## 2018-06-14 VITALS — BP 138/95 | HR 97 | Resp 18 | Ht 66.0 in | Wt 236.9 lb

## 2018-06-14 DIAGNOSIS — I6522 Occlusion and stenosis of left carotid artery: Secondary | ICD-10-CM | POA: Diagnosis not present

## 2018-06-14 DIAGNOSIS — I771 Stricture of artery: Secondary | ICD-10-CM

## 2018-06-14 DIAGNOSIS — I6523 Occlusion and stenosis of bilateral carotid arteries: Secondary | ICD-10-CM

## 2018-06-14 DIAGNOSIS — Z95828 Presence of other vascular implants and grafts: Secondary | ICD-10-CM | POA: Diagnosis not present

## 2018-06-14 DIAGNOSIS — Z9889 Other specified postprocedural states: Secondary | ICD-10-CM

## 2018-06-14 DIAGNOSIS — Z87891 Personal history of nicotine dependence: Secondary | ICD-10-CM

## 2018-06-14 NOTE — Progress Notes (Signed)
Chief Complaint: Follow up Extracranial Carotid Artery Stenosis   History of Present Illness  Gloria Murray is a 67 y.o. female who is status post left carotid endarterectomy in May 2010 by Dr. Donnetta Hutching. She was found to have high-grade distal common carotid artery restenosis. She underwent uneventful left carotid stenting by Dr. Trula Slade on 10/14/14 and is done well since this. She has had no neurologic deficits.  Most importantly she quit smoking following the stenting has not resumed this.  The patient has no history of TIA or stroke symptoms.Specifically the patient denies a history of amaurosis fugax or monocular blindness, unilateral facial drooping, hemiplegia, or receptive or expressive aphasia.  She denies steal symptoms in either arm, denies dizziness.  She does lots of walking on her job.  She denies claudication type symptoms with walking, denies non healing wounds, denies any cardiac problems. She also denies chest pain, denies dyspnea, denies feeling light headed.   In the last 2 weeks she has felt a tender lump on the left side of her neck, has post nasal drip, and some mild pain with swallowing; I advised her to notify her PCP of this.   Diabetic: No Tobacco use: formersmoker, started at age 34 yrs, quit In March 2016  Pt meds include: Statin : Yes ASA:yes Other anticoagulants/antiplatelets: Plavix since the carotid stent placed March 2016   Past Medical History:  Diagnosis Date  . Allergy   . Carotid artery occlusion   . Hyperlipidemia   . Hypertension   . Osteoporosis   . Thyroid disease     Social History Social History   Tobacco Use  . Smoking status: Former Smoker    Packs/day: 0.50    Types: Cigarettes, E-cigarettes    Last attempt to quit: 07/23/2014    Years since quitting: 3.8  . Smokeless tobacco: Never Used  Substance Use Topics  . Alcohol use: No    Alcohol/week: 0.0 standard drinks  . Drug use: No    Family History Family  History  Problem Relation Age of Onset  . Diabetes Mother   . Colon cancer Neg Hx     Surgical History Past Surgical History:  Procedure Laterality Date  . ARCH AORTOGRAM N/A 07/23/2014   Procedure: ARCH AORTOGRAM;  Surgeon: Rosetta Posner, MD;  Location: [facility] CATH LAB;  Service: Cardiovascular;  Laterality: N/A;  . CAROTID ANGIOGRAM N/A 07/23/2014   Procedure: CAROTID ANGIOGRAM;  Surgeon: Rosetta Posner, MD;  Location: [facility] CATH LAB;  Service: Cardiovascular;  Laterality: N/A;  . CAROTID ENDARTERECTOMY  12/11/2008   left  . CAROTID STENT INSERTION Left 10/14/2014   Procedure: CAROTID STENT INSERTION;  Surgeon: Serafina Mitchell, MD;  Location: [facility] CATH LAB;  Service: Cardiovascular;  Laterality: Left;   carotid  . CARPAL TUNNEL RELEASE     X 2, right wrist  . COLONOSCOPY  Jan. 2, 2015  . DILATION AND CURETTAGE OF UTERUS     X 2  . laceration hand Right    hand  . THYROIDECTOMY    . TUBAL LIGATION  1975   bilateral    Allergies  Allergen Reactions  . Propoxyphene Hcl Itching and Nausea Only    REACTION: nausea and itch  . Propoxyphene N-Acetaminophen Itching and Nausea Only    REACTION: nausea and  itch    Current Outpatient Medications  Medication Sig Dispense Refill  . Ascorbic Acid GRAN Take 1 tablet by mouth daily.      Marland Kitchen aspirin 81 MG  tablet Take 81 mg by mouth daily.      . Calcium Carbonate-Vitamin D (CALCIUM-VITAMIN D) 600-200 MG-UNIT CAPS Take 1 tablet by mouth 2 (two) times daily.      . clopidogrel (PLAVIX) 75 MG tablet Take 1 tablet (75 mg total) by mouth daily. 90 tablet 3  . Garlic 5093 MG CAPS Take 1 capsule by mouth 2 (two) times daily.      . hydrochlorothiazide (MICROZIDE) 12.5 MG capsule Take 1 capsule (12.5 mg total) by mouth daily. 90 capsule 3  . levothyroxine (SYNTHROID, LEVOTHROID) 112 MCG tablet Take 1 tablet (112 mcg total) by mouth daily. 90 tablet 3  . loratadine (CLARITIN) 10 MG tablet Take 10 mg by mouth as needed for allergies.     . metoprolol  succinate (TOPROL-XL) 25 MG 24 hr tablet Take 1 tablet (25 mg total) by mouth daily. 90 tablet 3  . Omega-3 Fatty Acids (FISH OIL) 1000 MG CAPS Take 1 capsule (1,000 mg total) by mouth daily.    . simvastatin (ZOCOR) 40 MG tablet Take 1 tablet (40 mg total) by mouth at bedtime. 90 tablet 3  . Vitamin D, Ergocalciferol, (DRISDOL) 50000 units CAPS capsule Take 1 capsule (50,000 Units total) by mouth every 7 (seven) days. 12 capsule 0   No current facility-administered medications for this visit.     Review of Systems : See HPI for pertinent positives and negatives.  Physical Examination  Vitals:   06/14/18 1209 06/14/18 1211  BP: (!) 125/94 (!) 138/95  Pulse: 97   Resp: 18   SpO2: 96%   Weight: 236 lb 14.4 oz (107.5 kg)   Height: 5\' 6"  (1.676 m)    Body mass index is 38.24 kg/m.  General: WDWN obese female in NAD GAIT: normal Eyes: PERRLA HENT: Enlarged lymph node left side of neck  Pulmonary:  Respirations are non-labored, fair air movement in all fields, CTAB, no rales, rhonchi, or wheezing. Cardiac: regular rhythm and rate, +murmur.  VASCULAR EXAM Carotid Bruits Right Left   Negative Negative     Abdominal aortic pulse is not palpable. Radial pulses are 2+ palpable and equal.                                                                                                                            LE Pulses Right Left       POPLITEAL  not palpable   not palpable       POSTERIOR TIBIAL   palpable    palpable        DORSALIS PEDIS      ANTERIOR TIBIAL  palpable   palpable     Gastrointestinal: soft, nontender, BS WNL, no r/g, no palpable masses. Musculoskeletal: no muscle atrophy/wasting. M/S 5/5 throughout, extremities without ischemic changes. Skin: No rashes, no ulcers, no cellulitis.   Neurologic:  A&O X 3; appropriate affect, sensation is normal; speech is normal, CN 2-12 intact, pain and light touch intact in  extremities, motor exam as listed  above. Psychiatric: Normal thought content, mood appropriate to clinical situation.    Assessment: Gloria Murray is a 67 y.o. female who is status post left carotid endarterectomy in May 2010. She was found to have high-grade distal common carotid artery restenosis. She underwent uneventful left carotid stenting by Dr. Trula Slade on 10/14/14 and is done well since this. She has had no neurologic deficits. She has no history of stroke or TIA.   She works in [employer], walks most of her 8 hour day.   Dr. Luther Parody April 2016 note: Will continue on Plavix long-term.  At pt's visit on 06/02/15,I discussed with Dr. Donnetta Hutching pt's asymptomatic 75-99% right subclavian stenosisat that time. Since she is asymptomatic therewas no need to address. She denies bleeding or bruising issues.  In the last 2 weeks she has felt a tender lump on the left side of her neck, has post nasal drip, and some mild pain with swallowing; I advised her to notify her PCP of this.     DATA Carotid Duplex (06-14-18): 40%-59% right internal carotid artery stenosis. Left common carotid artery is patent with stent present, no hyperplasia or hemodynamically significant changes present. Left internal carotid with 1-39% stenosis. Right vertebral artery is retrograde, left is antegrade. Right subclavian artery is dampened, left is stenotic. Essentially unchanged bilateral ICA since previous studies on 11/24/2014, 06/02/15, 06/07/16, and 06-08-17. Increased stenosis in left subclavian artery compared to the exam on 06-08-17.    Plan:  Follow-up in 1 year with Carotid Duplex scan.   I discussed in depth with the patient the nature of atherosclerosis, and emphasized the importance of maximal medical management including strict control of blood pressure, blood glucose, and lipid levels, obtaining regular exercise, and continued cessation of smoking.  The patient is aware that without maximal medical management the underlying  atherosclerotic disease process will progress, limiting the benefit of any interventions. The patient was given information about stroke prevention and what symptoms should prompt the patient to seek immediate medical care. Thank you for allowing [place] to participate in this patient's care.  Clemon Chambers, RN, MSN, FNP-C [facility] Office: [phone]  Clinic Physician: Oneida Alar  06/14/18 12:12 PM

## 2018-06-14 NOTE — Patient Instructions (Signed)

## 2018-08-24 ENCOUNTER — Ambulatory Visit (INDEPENDENT_AMBULATORY_CARE_PROVIDER_SITE_OTHER): Payer: Medicare HMO | Admitting: Internal Medicine

## 2018-08-24 ENCOUNTER — Encounter: Payer: Self-pay | Admitting: Internal Medicine

## 2018-08-24 VITALS — BP 132/94 | HR 104 | Temp 98.4°F | Wt 237.0 lb

## 2018-08-24 DIAGNOSIS — Z23 Encounter for immunization: Secondary | ICD-10-CM

## 2018-08-24 DIAGNOSIS — J3089 Other allergic rhinitis: Secondary | ICD-10-CM

## 2018-08-24 MED ORDER — METHYLPREDNISOLONE ACETATE 80 MG/ML IJ SUSP
80.0000 mg | Freq: Once | INTRAMUSCULAR | Status: AC
  Administered 2018-08-24: 80 mg via INTRAMUSCULAR

## 2018-08-24 NOTE — Addendum Note (Signed)
Addended by: Lindalou Hose Y on: 08/24/2018 05:00 PM   Modules accepted: Orders

## 2018-08-24 NOTE — Patient Instructions (Signed)

## 2018-08-24 NOTE — Progress Notes (Signed)
HPI  Pt presents to the clinic today with c/o ear fullness, sore throat and cough. She reports this started 3 weeks ago. She denies ear pain or decreased hearing. She is having some difficult swallowing. The cough is is nonproductive. She denies runny nose, nasal congestion, or shortness of breath. She denies fever, chills or body aches.  She has tried Claritin Ibuprofen without much relief. She has a history of allergies but denies breathing problems. She has not had sick contacts.  Review of Systems      Past Medical History:  Diagnosis Date  . Allergy   . Carotid artery occlusion   . Hyperlipidemia   . Hypertension   . Osteoporosis   . Thyroid disease     Family History  Problem Relation Age of Onset  . Diabetes Mother   . Colon cancer Neg Hx     Social History   Socioeconomic History  . Marital status: Widowed    Spouse name: Not on file  . Number of children: Not on file  . Years of education: Not on file  . Highest education level: Not on file  Occupational History  . Occupation: Retail buyer: [employer]  . Financial resource strain: Not on file  . Food insecurity:    Worry: Not on file    Inability: Not on file  . Transportation needs:    Medical: Not on file    Non-medical: Not on file  Tobacco Use  . Smoking status: Former Smoker    Packs/day: 0.50    Types: Cigarettes, E-cigarettes    Last attempt to quit: 07/23/2014    Years since quitting: 4.0  . Smokeless tobacco: Never Used  Substance and Sexual Activity  . Alcohol use: No    Alcohol/week: 0.0 standard drinks  . Drug use: No  . Sexual activity: Never  Lifestyle  . Physical activity:    Days per week: Not on file    Minutes per session: Not on file  . Stress: Not on file  Relationships  . Social connections:    Talks on phone: Not on file    Gets together: Not on file    Attends religious service: Not on file    Active member of club or organization: Not on file   Attends meetings of clubs or organizations: Not on file    Relationship status: Not on file  . Intimate partner violence:    Fear of current or ex partner: Not on file    Emotionally abused: Not on file    Physically abused: Not on file    Forced sexual activity: Not on file  Other Topics Concern  . Not on file  Social History Narrative   Does not have a living will.   Does not have any family.      [name] would "make decisions" for her-desires CPR, does not want prolonged life support if futile.      Both of her children- died in car accident at ages 67 and 6- husband went into a diabetic coma while driving.    Allergies  Allergen Reactions  . Propoxyphene Hcl Itching and Nausea Only    REACTION: nausea and itch  . Propoxyphene N-Acetaminophen Itching and Nausea Only    REACTION: nausea and  itch     Constitutional:  Denies headache, fatigue, fever or abrupt weight changes.  HEENT:  Positive ear fullness, sore throat. Denies eye redness, eye pain, pressure behind the  eyes, facial pain, nasal congestion, ear pain, ringing in the ears, wax buildup, runny nose or bloody nose. Respiratory: Positive cough. Denies difficulty breathing or shortness of breath.  Cardiovascular: Denies chest pain, chest tightness, palpitations or swelling in the hands or feet.   No other specific complaints in a complete review of systems (except as listed in HPI above).  Objective:   BP (!) 132/94   Pulse (!) 104   Temp 98.4 F (36.9 C) (Oral)   Wt 237 lb (107.5 kg)   SpO2 95%   BMI 38.25 kg/m   Wt Readings from Last 3 Encounters:  08/24/18 237 lb (107.5 kg)  06/14/18 236 lb 14.4 oz (107.5 kg)  10/06/17 230 lb (104.3 kg)     General: Appears her stated age, obese, in NAD. HEENT: Head: normal shape and size, mild frontal sinus tenderness noted;Ears: Tm's gray and intact, normal light reflex; Nose: mucosa pink and moist, turbinates swollen; Throat/Mouth: + PND. Teeth present, mucosa  erythematous and moist, no exudate noted, no lesions or ulcerations noted.  Neck: No cervical lymphadenopathy.  Cardiovascular: Normal rate and rhythm. Murmur noted.  Pulmonary/Chest: Normal effort and positive vesicular breath sounds. No respiratory distress. No wheezes, rales or ronchi noted.       Assessment & Plan:   Allergic Rhinitis:  Get some rest and drink plenty of water Do salt water gargles for the sore throat 80 mg Depo IM today Stop Claritin Start Allegra and Flonase OTC  RTC as needed or if symptoms persist.   Webb Silversmith, NP

## 2018-10-19 ENCOUNTER — Other Ambulatory Visit: Payer: Self-pay

## 2018-10-19 ENCOUNTER — Ambulatory Visit (INDEPENDENT_AMBULATORY_CARE_PROVIDER_SITE_OTHER): Payer: Medicare HMO | Admitting: Internal Medicine

## 2018-10-19 ENCOUNTER — Encounter: Payer: Medicare HMO | Admitting: Internal Medicine

## 2018-10-19 ENCOUNTER — Encounter: Payer: Self-pay | Admitting: Internal Medicine

## 2018-10-19 VITALS — BP 134/82 | HR 88 | Temp 98.0°F | Wt 232.0 lb

## 2018-10-19 DIAGNOSIS — H938X2 Other specified disorders of left ear: Secondary | ICD-10-CM | POA: Diagnosis not present

## 2018-10-19 DIAGNOSIS — R590 Localized enlarged lymph nodes: Secondary | ICD-10-CM

## 2018-10-19 DIAGNOSIS — R05 Cough: Secondary | ICD-10-CM | POA: Diagnosis not present

## 2018-10-19 DIAGNOSIS — J029 Acute pharyngitis, unspecified: Secondary | ICD-10-CM

## 2018-10-19 DIAGNOSIS — R0981 Nasal congestion: Secondary | ICD-10-CM | POA: Diagnosis not present

## 2018-10-19 DIAGNOSIS — R059 Cough, unspecified: Secondary | ICD-10-CM

## 2018-10-19 LAB — COMPREHENSIVE METABOLIC PANEL
ALT: 15 U/L (ref 0–35)
AST: 13 U/L (ref 0–37)
Albumin: 4.5 g/dL (ref 3.5–5.2)
Alkaline Phosphatase: 68 U/L (ref 39–117)
BUN: 15 mg/dL (ref 6–23)
CO2: 32 mEq/L (ref 19–32)
Calcium: 10.1 mg/dL (ref 8.4–10.5)
Chloride: 101 mEq/L (ref 96–112)
Creatinine, Ser: 0.8 mg/dL (ref 0.40–1.20)
GFR: 71.03 mL/min (ref >60.00)
Glucose, Bld: 131 mg/dL — ABNORMAL HIGH (ref 70–99)
Potassium: 4.1 mEq/L (ref 3.5–5.1)
Sodium: 141 mEq/L (ref 135–145)
Total Bilirubin: 0.5 mg/dL (ref 0.2–1.2)
Total Protein: 7.3 g/dL (ref 6.0–8.3)

## 2018-10-19 LAB — CBC
HCT: 44.9 % (ref 36.0–46.0)
Hemoglobin: 15.3 g/dL — ABNORMAL HIGH (ref 12.0–15.0)
MCHC: 34 g/dL (ref 30.0–36.0)
MCV: 93 fl (ref 78.0–100.0)
PLATELETS: 334 10*3/uL (ref 150.0–400.0)
RBC: 4.83 Mil/uL (ref 3.87–5.11)
RDW: 14.1 % (ref 11.5–15.5)
WBC: 5.5 10*3/uL (ref 4.0–10.5)

## 2018-10-19 LAB — MONONUCLEOSIS SCREEN: Mono Screen: NEGATIVE

## 2018-10-19 MED ORDER — SIMVASTATIN 40 MG PO TABS: 40.0000 mg | ORAL_TABLET | Freq: Every day | ORAL | 0 refills | Status: DC

## 2018-10-19 MED ORDER — METHYLPREDNISOLONE ACETATE 80 MG/ML IJ SUSP
80.0000 mg | Freq: Once | INTRAMUSCULAR | Status: AC
  Administered 2018-10-19: 80 mg via INTRAMUSCULAR

## 2018-10-19 MED ORDER — METOPROLOL SUCCINATE ER 25 MG PO TB24: 25.0000 mg | ORAL_TABLET | Freq: Every day | ORAL | 0 refills | Status: DC

## 2018-10-19 MED ORDER — HYDROCHLOROTHIAZIDE 12.5 MG PO CAPS: 12.5000 mg | ORAL_CAPSULE | Freq: Every day | ORAL | 0 refills | Status: DC

## 2018-10-19 MED ORDER — LEVOTHYROXINE SODIUM 112 MCG PO TABS: 112.0000 ug | ORAL_TABLET | Freq: Every day | ORAL | 0 refills | Status: DC

## 2018-10-19 NOTE — Patient Instructions (Signed)
Lymphadenopathy    Lymphadenopathy means that your lymph glands are swollen or larger than normal (enlarged). Lymph glands, also called lymph nodes, are collections of tissue that filter bacteria, viruses, and waste from your bloodstream. They are part of your body's disease-fighting system (immune system), which protects your body from germs.  There may be different causes of lymphadenopathy, depending on where it is in your body. Some types go away on their own. Lymphadenopathy can occur anywhere that you have lymph glands, including these areas:  · Neck (cervical lymphadenopathy).  · Chest (mediastinal lymphadenopathy).  · Lungs (hilar lymphadenopathy).  · Underarms (axillary lymphadenopathy).  · Groin (inguinal lymphadenopathy).  When your immune system responds to germs, infection-fighting cells and fluid build up in your lymph glands. This causes some swelling and enlargement. If the lymph glands do not go back to normal after you have an infection or disease, your health care provider may do tests. These tests help to monitor your condition and find the reason why the glands are still swollen and enlarged.  Follow these instructions at home:  · Get plenty of rest.  · Take over-the-counter and prescription medicines only as told by your health care provider. Your health care provider may recommend over-the-counter medicines for pain.  · If directed, apply heat to swollen lymph glands as often as told by your health care provider. Use the heat source that your health care provider recommends, such as a moist heat pack or a heating pad.  ? Place a towel between your skin and the heat source.  ? Leave the heat on for 20-30 minutes.  ? Remove the heat if your skin turns bright red. This is especially important if you are unable to feel pain, heat, or cold. You may have a greater risk of getting burned.  · Check your affected lymph glands every day for changes. Check other lymph gland areas as told by your health  care provider. Check for changes such as:  ? More swelling.  ? Sudden increase in size.  ? Redness or pain.  ? Hardness.  · Keep all follow-up visits as told by your health care provider. This is important.  Contact a health care provider if you have:  · Swelling that gets worse or spreads to other areas.  · Problems with breathing.  · Lymph glands that:  ? Are still swollen after 2 weeks.  ? Have suddenly gotten bigger.  ? Are red, painful, or hard.  · A fever or chills.  · Fatigue.  · A sore throat.  · Pain in your abdomen.  · Weight loss.  · Night sweats.  Get help right away if you have:  · Fluid leaking from an enlarged lymph gland.  · Severe pain.  · Chest pain.  · Shortness of breath.  Summary  · Lymphadenopathy means that your lymph glands are swollen or larger than normal (enlarged).  · Lymph glands (also called lymph nodes) are collections of tissue that filter bacteria, viruses, and waste from the bloodstream. They are part of your body's disease-fighting system (immune system).  · Lymphadenopathy can occur anywhere that you have lymph glands.  · If your enlarged and swollen lymph glands do not go back to normal after you have an infection or disease, your health care provider may do tests to monitor your condition and find the reason why the glands are still swollen and enlarged.  · Check your affected lymph glands every day for changes. Check other lymph   gland areas as told by your health care provider.  This information is not intended to replace advice given to you by your health care provider. Make sure you discuss any questions you have with your health care provider.  Document Released: 04/26/2008 Document Revised: 06/02/2017 Document Reviewed: 06/02/2017  Elsevier Interactive Patient Education © 2019 [employer] Inc[employer]

## 2018-10-19 NOTE — Addendum Note (Signed)
Addended by: Lurlean Nanny on: 10/19/2018 02:44 PM   Modules accepted: Orders

## 2018-10-19 NOTE — Progress Notes (Signed)
Subjective:    Patient ID: Gloria Murray, female    DOB: [birth year], 68 y.o.   MRN: [id number]  HPI  Pt presents to the clinic today with c/o watery eyes, nasal congestion, ear fullness, sore throat and cough. This started 3 weeks ago. She denies visual changes, eye  Itching or drainage. She is not blowing anything out of her nose. She denies ear pain, drainage or decreased hearing. Sheis having difficulty swallowing. The cough is productive of clear mucous. She denies shortness of breath. She denies fever, chills or body aches. She has not taken anything OTC for this. She has not had sick contacts that she is aware.  Review of Systems      Past Medical History:  Diagnosis Date  . Allergy   . Carotid artery occlusion   . Hyperlipidemia   . Hypertension   . Osteoporosis   . Thyroid disease     Current Outpatient Medications  Medication Sig Dispense Refill  . Ascorbic Acid GRAN Take 1 tablet by mouth daily.      Marland Kitchen aspirin 81 MG tablet Take 81 mg by mouth daily.      . Calcium Carbonate-Vitamin D (CALCIUM-VITAMIN D) 600-200 MG-UNIT CAPS Take 1 tablet by mouth 2 (two) times daily.      . clopidogrel (PLAVIX) 75 MG tablet Take 1 tablet (75 mg total) by mouth daily. 90 tablet 3  . Garlic 1540 MG CAPS Take 1 capsule by mouth 2 (two) times daily.      . hydrochlorothiazide (MICROZIDE) 12.5 MG capsule Take 1 capsule (12.5 mg total) by mouth daily. 90 capsule 0  . levothyroxine (SYNTHROID, LEVOTHROID) 112 MCG tablet Take 1 tablet (112 mcg total) by mouth daily. 90 tablet 0  . loratadine (CLARITIN) 10 MG tablet Take 10 mg by mouth as needed for allergies.     . metoprolol succinate (TOPROL-XL) 25 MG 24 hr tablet Take 1 tablet (25 mg total) by mouth daily. 90 tablet 0  . Omega-3 Fatty Acids (FISH OIL) 1000 MG CAPS Take 1 capsule (1,000 mg total) by mouth daily.    . simvastatin (ZOCOR) 40 MG tablet Take 1 tablet (40 mg total) by mouth at bedtime. 90 tablet 0   No current  facility-administered medications for this visit.     Allergies  Allergen Reactions  . Propoxyphene Hcl Itching and Nausea Only    REACTION: nausea and itch  . Propoxyphene N-Acetaminophen Itching and Nausea Only    REACTION: nausea and  itch    Family History  Problem Relation Age of Onset  . Diabetes Mother   . Colon cancer Neg Hx     Social History   Socioeconomic History  . Marital status: Widowed    Spouse name: Not on file  . Number of children: Not on file  . Years of education: Not on file  . Highest education level: Not on file  Occupational History  . Occupation: Retail buyer: [employer]  . Financial resource strain: Not on file  . Food insecurity:    Worry: Not on file    Inability: Not on file  . Transportation needs:    Medical: Not on file    Non-medical: Not on file  Tobacco Use  . Smoking status: Former Smoker    Packs/day: 0.50    Types: Cigarettes, E-cigarettes    Last attempt to quit: 07/23/2014    Years since quitting: 4.2  . Smokeless tobacco: Never Used  Substance and Sexual Activity  . Alcohol use: No    Alcohol/week: 0.0 standard drinks  . Drug use: No  . Sexual activity: Never  Lifestyle  . Physical activity:    Days per week: Not on file    Minutes per session: Not on file  . Stress: Not on file  Relationships  . Social connections:    Talks on phone: Not on file    Gets together: Not on file    Attends religious service: Not on file    Active member of club or organization: Not on file    Attends meetings of clubs or organizations: Not on file    Relationship status: Not on file  . Intimate partner violence:    Fear of current or ex partner: Not on file    Emotionally abused: Not on file    Physically abused: Not on file    Forced sexual activity: Not on file  Other Topics Concern  . Not on file  Social History Narrative   Does not have a living will.   Does not have any family.      [name] would "make decisions" for her-desires CPR, does not want prolonged life support if futile.      Both of her children- died in car accident at ages 70 and 35- husband went into a diabetic coma while driving.     Constitutional: Denies fever, malaise, fatigue, headache or abrupt weight changes.  HEENT: Pt reports nasal congestion, ear fullness, sore throat. Denies eye pain, eye redness, ear pain, ringing in the ears, wax buildup, runny nose, bloody nose. Respiratory: Pt reports cough. Denies difficulty breathing, shortness of breath.   Cardiovascular: Denies chest pain, chest tightness, palpitations or swelling in the hands or feet.   No other specific complaints in a complete review of systems (except as listed in HPI above).  Objective:   Physical Exam   BP 134/82   Pulse 88   Temp 98 F (36.7 C) (Oral)   Wt 232 lb (105.2 kg)   SpO2 97%   BMI 37.45 kg/m  Wt Readings from Last 3 Encounters:  10/19/18 232 lb (105.2 kg)  08/24/18 237 lb (107.5 kg)  06/14/18 236 lb 14.4 oz (107.5 kg)    General: Appears her stated age, obese, in NAD. Skin: Warm, dry and intact. No rashesnoted. HEENT: Head: normal shape and size, no sinus tenderness noted; Ears: Tm's gray and intact, normal light reflex; Nose: mucosa boggy and moist, turbinates swollen; Throat/Mouth: Teeth present, mucosa erythematous and moist, no exudate, lesions or ulcerations noted.  Neck:  Bilateral anterior cervical adenopathy, R>L. Cardiovascular: Normal rate and rhythm. Murmur noted. Pulmonary/Chest: Normal effort and positive vesicular breath sounds. No respiratory distress. No wheezes, rales or ronchi noted.  Neurological: Alert and oriented.  Psychiatric: Mood and affect normal. Behavior is normal. Judgment and thought content normal.    BMET    Component Value Date/Time   NA 144 10/06/2017 1504   K 5.0 10/06/2017 1504   CL 106 10/06/2017 1504   CO2 30 10/06/2017 1504   GLUCOSE 121 (H) 10/06/2017 1504    BUN 16 10/06/2017 1504   CREATININE 0.89 10/06/2017 1504   CALCIUM 9.9 10/06/2017 1504   GFRNONAA 77.65 10/12/2009 0904   GFRAA  12/12/2008 0440    >60        The eGFR has been calculated using the MDRD equation. This calculation has not been validated in all clinical situations. eGFR's persistently <60  mL/min signify possible Chronic Kidney Disease.    Lipid Panel     Component Value Date/Time   CHOL 153 10/06/2017 1504   TRIG 179 (H) 10/06/2017 1504   HDL 36 (L) 10/06/2017 1504   CHOLHDL 4.3 10/06/2017 1504   VLDL 31.0 06/07/2016 1022   LDLCALC 89 10/06/2017 1504    CBC    Component Value Date/Time   WBC 4.2 10/06/2017 1504   RBC 5.02 10/06/2017 1504   HGB 15.2 10/06/2017 1504   HCT 45.1 (H) 10/06/2017 1504   PLT 285 10/06/2017 1504   MCV 89.8 10/06/2017 1504   MCH 30.3 10/06/2017 1504   MCHC 33.7 10/06/2017 1504   RDW 12.5 10/06/2017 1504   LYMPHSABS 1.3 06/07/2016 1022   MONOABS 0.3 06/07/2016 1022   EOSABS 0.1 06/07/2016 1022   BASOSABS 0.0 06/07/2016 1022    Hgb A1C Lab Results  Component Value Date   HGBA1C 6.1 05/19/2014          Assessment & Plan:   Nasal Congestion, Ear Fullness, Sore Throat, Cough, Lymphadenopathy:  DDx include allergies, viral URI, strep pharyngitis, mono, mumps, parotitis Restart Allegra and Flonase RSt: negative 80 mg Depo IM today Will obtain CBC, CMET, Mono, Mumps IgG and IgM Discussed self isolation until results are back  Return precautions discussed Webb Silversmith, NP'

## 2018-10-22 LAB — MUMPS ANTIBODY, IGG: MUMPS IGG: 76.3 [AU]/ml

## 2018-10-22 LAB — MUMPS ANTIBODY, IGM: Mumps IgM Value: <1:20 {titer}

## 2018-10-23 ENCOUNTER — Other Ambulatory Visit: Payer: Self-pay | Admitting: Internal Medicine

## 2018-10-23 DIAGNOSIS — R221 Localized swelling, mass and lump, neck: Secondary | ICD-10-CM

## 2018-10-26 ENCOUNTER — Ambulatory Visit
Admission: RE | Admit: 2018-10-26 | Discharge: 2018-10-26 | Disposition: A | Discharge status: Home / Self Care | Payer: Medicare HMO | Source: Ambulatory Visit | Attending: Internal Medicine | Admitting: Internal Medicine

## 2018-10-26 ENCOUNTER — Telehealth: Payer: Self-pay

## 2018-10-26 ENCOUNTER — Other Ambulatory Visit: Payer: Self-pay

## 2018-10-26 DIAGNOSIS — R221 Localized swelling, mass and lump, neck: Secondary | ICD-10-CM | POA: Diagnosis not present

## 2018-10-26 DIAGNOSIS — R591 Generalized enlarged lymph nodes: Secondary | ICD-10-CM

## 2018-10-26 MED ORDER — IOPAMIDOL (ISOVUE-300) INJECTION 61%
75.0000 mL | Freq: Once | INTRAVENOUS | Status: AC | PRN
  Administered 2018-10-26: 75 mL via INTRAVENOUS

## 2018-10-26 MED ORDER — AMOXICILLIN-POT CLAVULANATE 875-125 MG PO TABS: 1.0000 | ORAL_TABLET | Freq: Two times a day (BID) | ORAL | 0 refills | Status: DC

## 2018-10-26 NOTE — Telephone Encounter (Signed)
Please review. [facility] radiology called in regards to CT soft tissue neck scan report. Please see below. Also result in the chart. Gloria Murray's patient. Not sure how to proceed.   IMPRESSION: 1. Bulky, partially necrotic bilateral cervical lymphadenopathy most concerning for metastatic disease, less likely unusual infection. 2. Prominent soft tissue at the tongue base with asymmetric left aryepiglottic fold thickening and slight fullness of the left palatine tonsil. Direct visualization is recommended to assess for a primary mucosal malignancy. 3.  Aortic Atherosclerosis (ICD10-I70.0).

## 2018-10-26 NOTE — Telephone Encounter (Signed)
Called patient.  The concern is for a cancer, d/w pt.  Needs ENT eval.   Please notify pt that I d/w Dr. Constance Holster with ENT.  He is working her into the schedule on Monday and I greatly appreciate his help.  He noted the possibility of infection and recommended the patient start augmentin in the meantime.   I sent rx for augmentin based on his rec.  I thank all involved.   Routed to PCP as FYI.

## 2018-10-29 DIAGNOSIS — J387 Other diseases of larynx: Secondary | ICD-10-CM | POA: Diagnosis not present

## 2018-10-29 DIAGNOSIS — Z7901 Long term (current) use of anticoagulants: Secondary | ICD-10-CM | POA: Diagnosis not present

## 2018-10-29 DIAGNOSIS — Z9089 Acquired absence of other organs: Secondary | ICD-10-CM | POA: Diagnosis not present

## 2018-10-29 DIAGNOSIS — Z87891 Personal history of nicotine dependence: Secondary | ICD-10-CM | POA: Diagnosis not present

## 2018-10-29 DIAGNOSIS — R590 Localized enlarged lymph nodes: Secondary | ICD-10-CM | POA: Diagnosis not present

## 2018-10-29 NOTE — Telephone Encounter (Signed)
Appt scheduled with Dr Constance Holster at [facility] for 10/29/18 at 9:15am, patient notified.

## 2018-10-31 ENCOUNTER — Encounter (HOSPITAL_BASED_OUTPATIENT_CLINIC_OR_DEPARTMENT_OTHER): Payer: Self-pay | Admitting: *Deleted

## 2018-10-31 ENCOUNTER — Other Ambulatory Visit: Payer: Self-pay | Admitting: Internal Medicine

## 2018-10-31 ENCOUNTER — Other Ambulatory Visit: Payer: Self-pay

## 2018-10-31 DIAGNOSIS — Z0000 Encounter for general adult medical examination without abnormal findings: Secondary | ICD-10-CM

## 2018-10-31 NOTE — H&P (Signed)
HPI:   Gloria Murray is a 68 y.o. female who presents as a consult Patient.   Referring Provider: Leonides Cave, MD  Chief complaint: Neck mass, hoarseness.  HPI: Back in January she developed bilateral neck masses. She had no other symptoms at the time. This lasted for a few weeks and then according to the patient all resolved. A few weeks later the neck masses returned and then she started having hoarseness and left ear pain. That has persisted now for 3-4 weeks. She had a CT scan recently which I have reviewed. She is quit smoking about 2 or 3 years ago when she had carotid endarterectomy. She is on Plavix. She is not having trouble swallowing.  PMH/Meds/All/SocHx/FamHx/ROS:   Past Medical History:  Diagnosis Date  . Heart murmur  . High cholesterol  . Hypertension  . Thyroid dysfunction   Past Surgical History:  Procedure Laterality Date  . STENT PLACEMENT  . WRIST SURGERY   No family history of bleeding disorders, wound healing problems or difficulty with anesthesia.   Social History   Socioeconomic History  . Marital status: Not on file  Spouse name: Not on file  . Number of children: Not on file  . Years of education: Not on file  . Highest education level: Not on file  Occupational History  . Not on file  Social Needs  . Financial resource strain: Not on file  . Food insecurity  Worry: Not on file  Inability: Not on file  . Transportation needs  Medical: Not on file  Non-medical: Not on file  Tobacco Use  . Smoking status: Former Research scientist (life sciences)  . Smokeless tobacco: Never Used  Substance and Sexual Activity  . Alcohol use: Not on file  . Drug use: Not on file  . Sexual activity: Not on file  Lifestyle  . Physical activity  Days per week: Not on file  Minutes per session: Not on file  . Stress: Not on file  Relationships  . Social Medical illustrator on phone: Not on file  Gets together: Not on file  Attends religious service: Not on file  Active member  of club or organization: Not on file  Attends meetings of clubs or organizations: Not on file  Relationship status: Not on file  Other Topics Concern  . Not on file  Social History Narrative  . Not on file   Current Outpatient Medications:  . hydroCHLOROthiazide (MICROZIDE) 12.5 mg capsule, Take 12.5 mg by mouth., Disp: , Rfl:  . levothyroxine (SYNTHROID) 112 MCG tablet, Take 112 mcg by mouth., Disp: , Rfl:  . metoPROLOL succinate (TOPROL-XL) 25 MG 24 hr tablet, Take 25 mg by mouth., Disp: , Rfl:  . simvastatin (ZOCOR) 40 MG tablet, Take 40 mg by mouth., Disp: , Rfl:  . clopidogreL (PLAVIX) 75 mg tablet *ANTIPLATELET*, Take 75 mg by mouth., Disp: , Rfl:   A complete ROS was performed with pertinent positives/negatives noted in the HPI. The remainder of the ROS are negative.   Physical Exam:   Ht 1.651 m (5\' 5" )  Wt 103.4 kg (228 lb)  BMI 37.94 kg/m   General: Healthy and alert, in no distress, breathing easily. Normal affect. In a pleasant mood. Voice is hoarse. Head: Normocephalic, atraumatic. No masses, or scars. Eyes: Pupils are equal, and reactive to light. Vision is grossly intact. No spontaneous or gaze nystagmus. Ears: Ear canals are clear. Tympanic membranes are intact, with normal landmarks and the middle ears are clear and healthy. Hearing:  Grossly normal. Nose: Nasal cavities are clear with healthy mucosa, no polyps or exudate. Airways are patent. Face: No masses or scars, facial nerve function is symmetric. Oral Cavity: No mucosal abnormalities are noted. Tongue with normal mobility. Dentition appears healthy. Oropharynx: Tonsils are symmetric. There are no mucosal masses identified. Tongue base appears normal and healthy. Larynx/Hypopharynx: Inadequate to visualize the larynx Chest: Deferred Neck: Multiple hard matted cervical nodes along the jugular chain, on both sides. Thyroid surgically absent. Thyroid scar from many years ago. Neuro: Cranial nerves II-XII with  normal function. Balance: Normal gate. Other findings: none.  Independent Review of Additional Tests or Records:  none  Procedures:  Procedure note: Flexible fiberoptic laryngoscopy  Details of the procedure were explained to the patient and all questions were answered.   Procedure:   After anesthetizing the nasal cavity with topical lidocaine and oxymetazoline drops, the flexible endoscope was introduced and passed through the nasal cavity into the nasopharynx. The scope was then advanced to the level of the oropharynx, then the hypopharynx and larynx.   Findings:   There is an exophytic and ulcerative mass that seems to arise from the left supraglottic larynx. Left cord mobility is at least impaired. Airway is patent. Cannot say for sure about the piriform sinus.  The scope was withdrawn from the nose. She tolerated the procedure well.   Impression & Plans:  Left supraglottic mass with bilateral adenopathy. This is worrisome for an advanced staged carcinoma. Recommend direct laryngoscopy with biopsy under anesthesia for surgical mapping, and for tissue diagnosis. We will discuss results after that and consider PET scan and consultation with medical and radiation oncology.

## 2018-11-02 NOTE — Telephone Encounter (Signed)
Please advise if pt is supposed to continue medication

## 2018-11-02 NOTE — Telephone Encounter (Signed)
Pt left v/m pt received refills except for plavix;pt is out of plavix since 10/29/18.pt has surgery of neck scheduled for 11/08/18. Pt wants to know if should be taking plavix and if so should pt take after the neck surgery. Pt request [facility] pharmacy.

## 2018-11-02 NOTE — Telephone Encounter (Signed)
She needs to discuss with surgeon because she will need to hold plavix prior to procedure.

## 2018-11-05 NOTE — Telephone Encounter (Signed)
Pt is aware, she just wanted to make sure she had refill, pt is aware Rx refilled last week and will get after procedure instructions on when to restart medication if they have advised her to stop

## 2018-11-07 ENCOUNTER — Encounter (HOSPITAL_COMMUNITY): Payer: Self-pay | Admitting: *Deleted

## 2018-11-07 ENCOUNTER — Other Ambulatory Visit: Payer: Self-pay

## 2018-11-07 NOTE — Progress Notes (Signed)
Pt denies SOB, chest pain, and being under the care of a cardiologist. Pt denies having a stress test, echo and cardiac cath. Pt denies having an EKG and chest x ray within the last year. Pt stated that last labs were 10/19/2018 (Epic). Pt stated that last dose of Plavix was 10/29/2018 as instructed by MD. Pt made aware to stop taking all vitamins, fish oil, Garlic and herbal medications. Do not take any NSAIDs ie: Ibuprofen, Advil, Naproxen (Aleve), Motrin, BC and Goody Powder.   Coronavirus Screening  Pt denies that she experienced the following symptoms:  Cough yes/no: No Fever (>100.78F)  yes/no: No Runny nose yes/no: No Sore throat yes/no: No Difficulty breathing/shortness of breath  yes/no: No  Have you or a family member traveled in the last 14 days and where? yes/no: No   Pt denies that her transport/caregiver has experienced to following CORONA symptoms; pt stated that she will re-assess friend this evening when she arrives home from work.  Pt reminded that hospital visitation restrictions are in effect and the importance of the restrictions.  Pt verbalized understanding of all pre-op instructions.

## 2018-11-08 ENCOUNTER — Ambulatory Visit (HOSPITAL_COMMUNITY)
Admission: RE | Admit: 2018-11-08 | Discharge: 2018-11-08 | Disposition: A | Discharge status: Home / Self Care | Payer: Medicare HMO | Attending: Otolaryngology | Admitting: Otolaryngology

## 2018-11-08 ENCOUNTER — Ambulatory Visit (HOSPITAL_COMMUNITY): Payer: Medicare HMO | Admitting: Registered Nurse

## 2018-11-08 ENCOUNTER — Other Ambulatory Visit: Payer: Self-pay

## 2018-11-08 ENCOUNTER — Encounter (HOSPITAL_COMMUNITY)
Admission: RE | Disposition: A | Discharge status: Home / Self Care | Payer: Self-pay | Source: Home / Self Care | Attending: Otolaryngology

## 2018-11-08 ENCOUNTER — Encounter (HOSPITAL_COMMUNITY): Payer: Self-pay | Admitting: Registered Nurse

## 2018-11-08 DIAGNOSIS — Z7982 Long term (current) use of aspirin: Secondary | ICD-10-CM | POA: Insufficient documentation

## 2018-11-08 DIAGNOSIS — C321 Malignant neoplasm of supraglottis: Secondary | ICD-10-CM | POA: Diagnosis not present

## 2018-11-08 DIAGNOSIS — R590 Localized enlarged lymph nodes: Secondary | ICD-10-CM | POA: Insufficient documentation

## 2018-11-08 DIAGNOSIS — C01 Malignant neoplasm of base of tongue: Secondary | ICD-10-CM | POA: Insufficient documentation

## 2018-11-08 DIAGNOSIS — Z7951 Long term (current) use of inhaled steroids: Secondary | ICD-10-CM | POA: Insufficient documentation

## 2018-11-08 DIAGNOSIS — I739 Peripheral vascular disease, unspecified: Secondary | ICD-10-CM | POA: Insufficient documentation

## 2018-11-08 DIAGNOSIS — Z79890 Hormone replacement therapy (postmenopausal): Secondary | ICD-10-CM | POA: Insufficient documentation

## 2018-11-08 DIAGNOSIS — I10 Essential (primary) hypertension: Secondary | ICD-10-CM | POA: Diagnosis not present

## 2018-11-08 DIAGNOSIS — C131 Malignant neoplasm of aryepiglottic fold, hypopharyngeal aspect: Secondary | ICD-10-CM | POA: Insufficient documentation

## 2018-11-08 DIAGNOSIS — C329 Malignant neoplasm of larynx, unspecified: Secondary | ICD-10-CM | POA: Diagnosis not present

## 2018-11-08 DIAGNOSIS — Z7902 Long term (current) use of antithrombotics/antiplatelets: Secondary | ICD-10-CM | POA: Diagnosis not present

## 2018-11-08 DIAGNOSIS — E785 Hyperlipidemia, unspecified: Secondary | ICD-10-CM | POA: Diagnosis not present

## 2018-11-08 DIAGNOSIS — Z79899 Other long term (current) drug therapy: Secondary | ICD-10-CM | POA: Insufficient documentation

## 2018-11-08 DIAGNOSIS — Z955 Presence of coronary angioplasty implant and graft: Secondary | ICD-10-CM | POA: Diagnosis not present

## 2018-11-08 DIAGNOSIS — J387 Other diseases of larynx: Secondary | ICD-10-CM | POA: Diagnosis not present

## 2018-11-08 DIAGNOSIS — C100 Malignant neoplasm of vallecula: Secondary | ICD-10-CM | POA: Diagnosis not present

## 2018-11-08 DIAGNOSIS — Z87891 Personal history of nicotine dependence: Secondary | ICD-10-CM | POA: Diagnosis not present

## 2018-11-08 DIAGNOSIS — E039 Hypothyroidism, unspecified: Secondary | ICD-10-CM | POA: Diagnosis not present

## 2018-11-08 HISTORY — DX: Presence of dental prosthetic device (complete) (partial): Z97.2

## 2018-11-08 HISTORY — PX: LARYNGOSCOPY: SHX5203

## 2018-11-08 HISTORY — DX: Other diseases of larynx: J38.7

## 2018-11-08 HISTORY — DX: Presence of spectacles and contact lenses: Z97.3

## 2018-11-08 HISTORY — PX: RIGID ESOPHAGOSCOPY: SHX5226

## 2018-11-08 HISTORY — DX: Hypothyroidism, unspecified: E03.9

## 2018-11-08 HISTORY — DX: Cardiac murmur, unspecified: R01.1

## 2018-11-08 LAB — CBC
HCT: 46.1 % — ABNORMAL HIGH (ref 36.0–46.0)
Hemoglobin: 15.4 g/dL — ABNORMAL HIGH (ref 12.0–15.0)
MCH: 31.8 pg (ref 26.0–34.0)
MCHC: 33.4 g/dL (ref 30.0–36.0)
MCV: 95.1 fL (ref 80.0–100.0)
Platelets: 352 10*3/uL (ref 150–400)
RBC: 4.85 MIL/uL (ref 3.87–5.11)
RDW: 13.2 % (ref 11.5–15.5)
WBC: 5.9 10*3/uL (ref 4.0–10.5)
nRBC: 0 % (ref 0.0–0.2)

## 2018-11-08 LAB — BASIC METABOLIC PANEL
Anion gap: 13 (ref 5–15)
BUN: 13 mg/dL (ref 8–23)
CO2: 27 mmol/L (ref 22–32)
Calcium: 9.9 mg/dL (ref 8.9–10.3)
Chloride: 102 mmol/L (ref 98–111)
Creatinine, Ser: 0.78 mg/dL (ref 0.44–1.00)
GFR calc Af Amer: >60 mL/min (ref >60)
GFR calc non Af Amer: >60 mL/min (ref >60)
Glucose, Bld: 136 mg/dL — ABNORMAL HIGH (ref 70–99)
Potassium: 3.8 mmol/L (ref 3.5–5.1)
Sodium: 142 mmol/L (ref 135–145)

## 2018-11-08 SURGERY — LARYNGOSCOPY
Anesthesia: General | Site: Throat

## 2018-11-08 MED ORDER — OXYCODONE HCL 5 MG/5ML PO SOLN: 5.0000 mg | Freq: Once | ORAL | Status: DC | PRN

## 2018-11-08 MED ORDER — SODIUM CHLORIDE 0.9 % IV SOLN
INTRAVENOUS | Status: DC | PRN
  Administered 2018-11-08: 15 ug/min via INTRAVENOUS

## 2018-11-08 MED ORDER — LACTATED RINGERS IV SOLN
INTRAVENOUS | Status: DC
  Administered 2018-11-08: 08:00:00 via INTRAVENOUS

## 2018-11-08 MED ORDER — FENTANYL CITRATE (PF) 250 MCG/5ML IJ SOLN
INTRAMUSCULAR | Status: AC
  Filled 2018-11-08: qty 5

## 2018-11-08 MED ORDER — ROCURONIUM BROMIDE 10 MG/ML (PF) SYRINGE
PREFILLED_SYRINGE | INTRAVENOUS | Status: DC | PRN
  Administered 2018-11-08: 40 mg via INTRAVENOUS

## 2018-11-08 MED ORDER — OXYCODONE HCL 5 MG PO TABS: 5.0000 mg | ORAL_TABLET | Freq: Once | ORAL | Status: DC | PRN

## 2018-11-08 MED ORDER — SUCCINYLCHOLINE CHLORIDE 200 MG/10ML IV SOSY
PREFILLED_SYRINGE | INTRAVENOUS | Status: DC | PRN
  Administered 2018-11-08: 140 mg via INTRAVENOUS

## 2018-11-08 MED ORDER — FENTANYL CITRATE (PF) 250 MCG/5ML IJ SOLN
INTRAMUSCULAR | Status: DC | PRN
  Administered 2018-11-08 (×3): 50 ug via INTRAVENOUS

## 2018-11-08 MED ORDER — PROPOFOL 10 MG/ML IV BOLUS
INTRAVENOUS | Status: DC | PRN
  Administered 2018-11-08: 150 mg via INTRAVENOUS

## 2018-11-08 MED ORDER — MIDAZOLAM HCL 2 MG/2ML IJ SOLN
INTRAMUSCULAR | Status: AC
  Filled 2018-11-08: qty 2

## 2018-11-08 MED ORDER — ONDANSETRON HCL 4 MG/2ML IJ SOLN
INTRAMUSCULAR | Status: DC | PRN
  Administered 2018-11-08: 4 mg via INTRAVENOUS

## 2018-11-08 MED ORDER — PHENYLEPHRINE 40 MCG/ML (10ML) SYRINGE FOR IV PUSH (FOR BLOOD PRESSURE SUPPORT)
PREFILLED_SYRINGE | INTRAVENOUS | Status: DC | PRN
  Administered 2018-11-08: 80 ug via INTRAVENOUS
  Administered 2018-11-08: 120 ug via INTRAVENOUS

## 2018-11-08 MED ORDER — 0.9 % SODIUM CHLORIDE (POUR BTL) OPTIME
TOPICAL | Status: DC | PRN
  Administered 2018-11-08: 1000 mL

## 2018-11-08 MED ORDER — PROMETHAZINE HCL 25 MG/ML IJ SOLN: 6.2500 mg | INTRAMUSCULAR | Status: DC | PRN

## 2018-11-08 MED ORDER — DEXAMETHASONE SODIUM PHOSPHATE 10 MG/ML IJ SOLN
INTRAMUSCULAR | Status: DC | PRN
  Administered 2018-11-08: 10 mg via INTRAVENOUS

## 2018-11-08 MED ORDER — LIDOCAINE 2% (20 MG/ML) 5 ML SYRINGE
INTRAMUSCULAR | Status: DC | PRN
  Administered 2018-11-08: 100 mg via INTRAVENOUS

## 2018-11-08 MED ORDER — LACTATED RINGERS IV SOLN
INTRAVENOUS | Status: DC | PRN
  Administered 2018-11-08: 10:00:00 via INTRAVENOUS

## 2018-11-08 MED ORDER — SUGAMMADEX SODIUM 200 MG/2ML IV SOLN
INTRAVENOUS | Status: DC | PRN
  Administered 2018-11-08: 300 mg via INTRAVENOUS
  Administered 2018-11-08: 100 mg via INTRAVENOUS

## 2018-11-08 MED ORDER — MIDAZOLAM HCL 5 MG/5ML IJ SOLN
INTRAMUSCULAR | Status: DC | PRN
  Administered 2018-11-08: 2 mg via INTRAVENOUS

## 2018-11-08 MED ORDER — PROPOFOL 10 MG/ML IV BOLUS
INTRAVENOUS | Status: AC
  Filled 2018-11-08: qty 20

## 2018-11-08 MED ORDER — FENTANYL CITRATE (PF) 100 MCG/2ML IJ SOLN: 25.0000 ug | INTRAMUSCULAR | Status: DC | PRN

## 2018-11-08 SURGICAL SUPPLY — 35 items
BALLN PULM 15 16.5 18X75 (BALLOONS)
BALLOON PULM 15 16.5 18X75 (BALLOONS) IMPLANT
BANDAGE EYE OVAL (MISCELLANEOUS) ×6 IMPLANT
BLADE SURG 15 STRL LF DISP TIS (BLADE) IMPLANT
BLADE SURG 15 STRL SS (BLADE)
CANISTER SUCT 3000ML PPV (MISCELLANEOUS) ×3 IMPLANT
COVER BACK TABLE 60X90IN (DRAPES) ×3 IMPLANT
COVER MAYO STAND STRL (DRAPES) ×3 IMPLANT
COVER WAND RF STERILE (DRAPES) ×3 IMPLANT
CRADLE DONUT ADULT HEAD (MISCELLANEOUS) IMPLANT
DRAPE HALF SHEET 40X57 (DRAPES) ×3 IMPLANT
GAUZE 4X4 16PLY RFD (DISPOSABLE) ×3 IMPLANT
GAUZE SPONGE 4X4 12PLY STRL (GAUZE/BANDAGES/DRESSINGS) ×3 IMPLANT
GLOVE BIO SURGEON STRL SZ7.5 (GLOVE) ×3 IMPLANT
GOWN STRL REUS W/ TWL LRG LVL3 (GOWN DISPOSABLE) ×4 IMPLANT
GOWN STRL REUS W/TWL LRG LVL3 (GOWN DISPOSABLE) ×6
GUARD TEETH (MISCELLANEOUS) IMPLANT
KIT BASIN OR (CUSTOM PROCEDURE TRAY) ×3 IMPLANT
KIT TURNOVER KIT B (KITS) ×3 IMPLANT
NDL HYPO 25GX1X1/2 BEV (NEEDLE) IMPLANT
NDL TRANS ORAL INJECTION (NEEDLE) ×2 IMPLANT
NEEDLE HYPO 25GX1X1/2 BEV (NEEDLE) IMPLANT
NEEDLE TRANS ORAL INJECTION (NEEDLE) ×3 IMPLANT
NS IRRIG 1000ML POUR BTL (IV SOLUTION) ×3 IMPLANT
PAD ARMBOARD 7.5X6 YLW CONV (MISCELLANEOUS) ×6 IMPLANT
PATTIES SURGICAL .5 X1 (DISPOSABLE) IMPLANT
PATTIES SURGICAL .5 X3 (DISPOSABLE) IMPLANT
SOLUTION ANTI FOG 6CC (MISCELLANEOUS) ×3 IMPLANT
STAPLER TA60 3.5 010317 (STAPLE) IMPLANT
SURGILUBE 2OZ TUBE FLIPTOP (MISCELLANEOUS) ×3 IMPLANT
SUT SILK 2 0 SH (SUTURE) IMPLANT
SYR INFLATE BILIARY GAUGE (MISCELLANEOUS) ×3 IMPLANT
TOWEL NATURAL 6PK STERILE (DISPOSABLE) ×3 IMPLANT
TOWEL OR 17X24 6PK STRL BLUE (TOWEL DISPOSABLE) ×3 IMPLANT
TUBE CONNECTING 12X1/4 (SUCTIONS) ×3 IMPLANT

## 2018-11-08 NOTE — H&P (Signed)
Gloria Murray is an 68 y.o. female.   Chief Complaint: Laryngeal mass HPI: 68 year old female developed bilateral neck masses in January.  They seemed to improve only to worsen again.  She has since developed hoarseness and left ear pain.  She denies dysphagia.  She quit smoking 2-3 years ago.  She normally takes Plavix but this has been held for the past week.  Fiberoptic exam in the office by Dr. Constance Holster demonstrated a non-obstructing supraglottic mass.  Past Medical History:  Diagnosis Date  . Allergy   . Carotid artery occlusion   . Heart murmur   . Hyperlipidemia   . Hypertension   . Hypothyroidism   . Laryngeal mass   . Osteoporosis   . Thyroid disease   . Wears dentures   . Wears glasses     Past Surgical History:  Procedure Laterality Date  . ARCH AORTOGRAM N/A 07/23/2014   Procedure: ARCH AORTOGRAM;  Surgeon: Rosetta Posner, MD;  Location: [facility] CATH LAB;  Service: Cardiovascular;  Laterality: N/A;  . CAROTID ANGIOGRAM N/A 07/23/2014   Procedure: CAROTID ANGIOGRAM;  Surgeon: Rosetta Posner, MD;  Location: [facility] CATH LAB;  Service: Cardiovascular;  Laterality: N/A;  . CAROTID ENDARTERECTOMY  12/11/2008   left  . CAROTID STENT INSERTION Left 10/14/2014   Procedure: CAROTID STENT INSERTION;  Surgeon: Serafina Mitchell, MD;  Location: [facility] CATH LAB;  Service: Cardiovascular;  Laterality: Left;   carotid  . CARPAL TUNNEL RELEASE     X 2, right wrist  . COLONOSCOPY  Jan. 2, 2015  . DILATION AND CURETTAGE OF UTERUS     X 2  . laceration hand Right    hand  . THYROIDECTOMY    . TUBAL LIGATION  1975   bilateral    Family History  Problem Relation Age of Onset  . Diabetes Mother   . Colon cancer Neg Hx    Social History:  reports that she quit smoking about 4 years ago. Her smoking use included cigarettes. She smoked 0.50 packs per day. She has never used smokeless tobacco. She reports that she does not drink alcohol or use drugs.  Allergies:  Allergies  Allergen Reactions  .  Propoxyphene Hcl Itching and Nausea Only    REACTION: nausea and itch  . Propoxyphene N-Acetaminophen Itching and Nausea Only    REACTION: nausea and  itch    Medications Prior to Admission  Medication Sig Dispense Refill  . Ascorbic Acid (VITAMIN C PO) Take 1 tablet by mouth daily.    Marland Kitchen aspirin EC 81 MG tablet Take 81 mg by mouth daily.    Marland Kitchen CALCIUM PO Take 1 tablet by mouth daily.    . Cholecalciferol (VITAMIN D3 PO) Take 1 tablet by mouth daily.    . fexofenadine (ALLEGRA) 180 MG tablet Take 180 mg by mouth at bedtime.     . fluticasone (FLONASE) 50 MCG/ACT nasal spray Place 1 spray into both nostrils daily.     . Garlic 6144 MG CAPS Take 1 capsule by mouth 2 (two) times daily.      . hydrochlorothiazide (MICROZIDE) 12.5 MG capsule Take 1 capsule (12.5 mg total) by mouth daily. 90 capsule 0  . levothyroxine (SYNTHROID, LEVOTHROID) 112 MCG tablet Take 1 tablet (112 mcg total) by mouth daily. 90 tablet 0  . metoprolol succinate (TOPROL-XL) 25 MG 24 hr tablet Take 1 tablet (25 mg total) by mouth daily. 90 tablet 0  . Omega-3 Fatty Acids (FISH OIL) 1000  MG CAPS Take 1 capsule (1,000 mg total) by mouth daily.    . simvastatin (ZOCOR) 40 MG tablet Take 1 tablet (40 mg total) by mouth at bedtime. (Patient taking differently: Take 40 mg by mouth daily. ) 90 tablet 0  . VITAMIN E PO Take 1 capsule by mouth daily.    Marland Kitchen amoxicillin-clavulanate (AUGMENTIN) 875-125 MG tablet Take 1 tablet by mouth 2 (two) times daily. (Patient not taking: Reported on 11/07/2018) 20 tablet 0  . clopidogrel (PLAVIX) 75 MG tablet TAKE 1 TABLET (75 MG TOTAL) BY MOUTH DAILY. 90 tablet 3    Results for orders placed or performed during the hospital encounter of 11/08/18 (from the past 48 hour(s))  Basic metabolic panel     Status: Abnormal   Collection Time: 11/08/18  7:21 AM  Result Value Ref Range   Sodium 142 135 - 145 mmol/L   Potassium 3.8 3.5 - 5.1 mmol/L   Chloride 102 98 - 111 mmol/L   CO2 27 22 - 32 mmol/L    Glucose, Bld 136 (H) 70 - 99 mg/dL   BUN 13 8 - 23 mg/dL   Creatinine, Ser 0.78 0.44 - 1.00 mg/dL   Calcium 9.9 8.9 - 10.3 mg/dL   GFR calc non Af Amer >60 >60 mL/min   GFR calc Af Amer >60 >60 mL/min   Anion gap 13 5 - 15    Comment: Performed at [facility], [place] [address], [place], [place] [address]  CBC     Status: Abnormal   Collection Time: 11/08/18  7:21 AM  Result Value Ref Range   WBC 5.9 4.0 - 10.5 K/uL   RBC 4.85 3.87 - 5.11 MIL/uL   Hemoglobin 15.4 (H) 12.0 - 15.0 g/dL   HCT 46.1 (H) 36.0 - 46.0 %   MCV 95.1 80.0 - 100.0 fL   MCH 31.8 26.0 - 34.0 pg   MCHC 33.4 30.0 - 36.0 g/dL   RDW 13.2 11.5 - 15.5 %   Platelets 352 150 - 400 K/uL   nRBC 0.0 0.0 - 0.2 %    Comment: Performed at [facility], [place] [address], [place], [place] [address]   No results found.  Review of Systems  All other systems reviewed and are negative.   Blood pressure 106/79, pulse 100, temperature 98.8 F (37.1 C), temperature source Oral, resp. rate 18, height 5\' 5"  (1.651 m), weight 104.3 kg, SpO2 96 %. Physical Exam  Constitutional: She is oriented to person, place, and time. She appears well-developed and well-nourished. No distress.  HENT:  Head: Normocephalic and atraumatic.  Right Ear: External ear normal.  Left Ear: External ear normal.  Nose: Nose normal.  Mouth/Throat: Oropharynx is clear and moist.  Raspy hoarseness  Eyes: Pupils are equal, round, and reactive to light. Conjunctivae and EOM are normal.  Neck: Normal range of motion. Neck supple.  Bilateral multiple hard cervical lymph nodes.  Absent thyroid.  Cardiovascular: Normal rate.  Respiratory: Effort normal.  Neurological: She is alert and oriented to person, place, and time. No cranial nerve deficit.  Skin: Skin is warm and dry.  Psychiatric: She has a normal mood and affect. Her behavior is normal. Judgment and thought content normal.     Assessment/Plan Laryngeal mass, hoarseness, and bilateral  cervical lymphadenopathy  To OR for direct laryngoscopy with biopsy.  Melida Quitter, MD 11/08/2018, 10:09 AM

## 2018-11-08 NOTE — Anesthesia Postprocedure Evaluation (Signed)
Anesthesia Post Note  Patient: Gloria Murray  Procedure(s) Performed: DIRECT MICROLARYNGOSCOPY WITH BIOPSY (N/A Throat) ESOPHAGOSCOPY (N/A Esophagus)     Patient location during evaluation: PACU Anesthesia Type: General Level of consciousness: awake and alert Pain management: pain level controlled Vital Signs Assessment: post-procedure vital signs reviewed and stable Respiratory status: spontaneous breathing, nonlabored ventilation, respiratory function stable and patient connected to nasal cannula oxygen Cardiovascular status: blood pressure returned to baseline and stable Postop Assessment: no apparent nausea or vomiting Anesthetic complications: no    Last Vitals:  Vitals:   11/08/18 1200 11/08/18 1215  BP: (!) 144/73 (!) 145/74  Pulse: 95 95  Resp: 18 18  Temp:    SpO2: 98% 95%    Last Pain:  Vitals:   11/08/18 1200  TempSrc:   PainSc: 0-No pain                 Aurelie Dicenzo S

## 2018-11-08 NOTE — Op Note (Signed)
NAME: Gloria [place], [place] VZ:[id number] ACCOUNT [id number] DATE OF BIRTH:[birth year] FACILITY: [facility] LOCATION: [facility] PHYSICIAN:Shabre Kreher D. Isreal Moline, MD  OPERATIVE REPORT  DATE OF PROCEDURE:  11/08/2018  PREOPERATIVE DIAGNOSES: 1.  Laryngeal mass.   2.  Hoarseness. 3.  Bilateral cervical adenopathy.  POSTOPERATIVE DIAGNOSES: 1.  Laryngeal mass.   2.  Hoarseness. 3.  Bilateral cervical adenopathy.  PROCEDURE: 1.  Microdirect laryngoscopy with biopsy. 2.  Esophagoscopy.  SURGEON:  Melida Quitter, MD  ANESTHESIA:  General endotracheal anesthesia.  COMPLICATIONS:  None.  INDICATIONS:  The patient is a 68 year old female who developed bilateral enlarged cervical lymph nodes in January and later hoarseness and some left ear pain.  She was found by fiberoptic exam to have a mass of the left supraglottic larynx and presents  to the operating room for biopsy.  FINDINGS:  There was a granular mass involving the left aryepiglottic fold on the endolaryngeal side as well as on the piriform sinus side to the piriform.  Sinus itself was free of tumor.  Tumor extended up the epiglottis involving the vallecular  surface as well as the vallecular surface of the tongue base on the left.  It seemed to taper at the midline and not really involve the right side.  The vocal folds themselves were free of tumor, and the tumor extended onto the false cord but not onto  the surface of the ventricle.  The subglottis and esophagus were normal.  DESCRIPTION OF PROCEDURE:  The patient was identified in the holding room, informed consent having been obtained with discussion of risks, benefits, and alternatives.  The patient was brought to the operative suite and put on the operative table in  supine position.  Anesthesia was induced.  The patient was intubated by the anesthesia team without difficulty using a GlideScope.  The eyes were taped closed and the bed was turned 90 degrees from anesthesia.  The  patient was given intravenous steroids  during the case.  A Dedo laryngoscope was then inserted and used to evaluate the various areas of the pharynx and larynx.  Findings noted above.  The scope was placed in suspension on the Mayo stand using the Lewy arm, and the 0-degree telescope was used  to make photographs of the tumor.  Biopsies were taken from the left endolaryngeal supraglottis, the top of the aryepiglottic fold on the left, the left vallecular tongue base, and the right vallecula epiglottis.  After this was completed, the airway  was suctioned.  The laryngoscope was taken out of suspension and removed from the patient's mouth.  A rigid cervical esophagoscope was then passed through the mouth and passed down the esophagus, keeping the lumen in view.  After it was fully inserted,  it was gradually backed out until fully removed.  After this was completed, the patient was turned back to anesthesia for wakeup and was extubated in the recovery room in stable condition.  LN/NUANCE  D:11/08/2018 T:11/08/2018 JOB:[id number]

## 2018-11-08 NOTE — Transfer of Care (Signed)
Immediate Anesthesia Transfer of Care Note  Patient: Gloria Murray  Procedure(s) Performed: DIRECT MICROLARYNGOSCOPY (N/A Throat) ESOPHAGOSCOPY WITH BIOPSY (N/A Esophagus)  Patient Location: PACU  Anesthesia Type:General  Level of Consciousness: awake, alert  and oriented  Airway & Oxygen Therapy: Patient Spontanous Breathing and Patient connected to face mask oxygen  Post-op Assessment: Report given to RN and Post -op Vital signs reviewed and stable  Post vital signs: Reviewed and stable  Last Vitals:  Vitals Value Taken Time  BP 139/79   Temp    Pulse 92 11/08/2018 11:14 AM  Resp 19 11/08/2018 11:14 AM  SpO2 98 % 11/08/2018 11:14 AM  Vitals shown include unvalidated device data.  Last Pain:  Vitals:   11/08/18 0744  TempSrc:   PainSc: 0-No pain      Patients Stated Pain Goal: 2 (53/79/43 [id number])  Complications: No apparent anesthesia complications

## 2018-11-08 NOTE — Anesthesia Preprocedure Evaluation (Addendum)
Anesthesia Evaluation  Patient identified by MRN, date of birth, ID band Patient awake    Reviewed: Allergy & Precautions, NPO status , Patient's Chart, lab work & pertinent test results  Airway Mallampati: III  TM Distance: <3 FB Neck ROM: Full   Comment: There is an exophytic and ulcerative mass that seems to arise from the left supraglottic larynx. Left cord mobility is at least impaired. Airway is patent. Cannot say for sure about the piriform sinus. Dental no notable dental hx.    Pulmonary neg pulmonary ROS, former smoker,    Pulmonary exam normal breath sounds clear to auscultation       Cardiovascular hypertension, Pt. on medications and Pt. on home beta blockers + Peripheral Vascular Disease   Rhythm:Regular Rate:Normal + Systolic murmurs    Neuro/Psych negative neurological ROS  negative psych ROS   GI/Hepatic negative GI ROS, Neg liver ROS,   Endo/Other  Hypothyroidism   Renal/GU negative Renal ROS  negative genitourinary   Musculoskeletal negative musculoskeletal ROS (+)   Abdominal   Peds negative pediatric ROS (+)  Hematology negative hematology ROS (+)   Anesthesia Other Findings   Reproductive/Obstetrics negative OB ROS                            Anesthesia Physical Anesthesia Plan  ASA: III  Anesthesia Plan: General   Post-op Pain Management:    Induction: Intravenous and Rapid sequence  PONV Risk Score and Plan: 3 and Ondansetron, Dexamethasone and Treatment may vary due to age or medical condition  Airway Management Planned: Oral ETT and Video Laryngoscope Planned  Additional Equipment:   Intra-op Plan:   Post-operative Plan: Extubation in OR  Informed Consent: I have reviewed the patients History and Physical, chart, labs and discussed the procedure including the risks, benefits and alternatives for the proposed anesthesia with the patient or authorized  representative who has indicated his/her understanding and acceptance.     Dental advisory given  Plan Discussed with: CRNA and Surgeon  Anesthesia Plan Comments: (There is an exophytic and ulcerative mass that seems to arise from the left supraglottic larynx. Left cord mobility is at least impaired. Airway is patent. Cannot say for sure about the piriform sinus)       Anesthesia Quick Evaluation

## 2018-11-08 NOTE — Anesthesia Procedure Notes (Signed)
Procedure Name: Intubation Date/Time: 11/08/2018 10:30 AM Performed by: Jearld Pies, CRNA Pre-anesthesia Checklist: Patient identified, Emergency Drugs available, Suction available and Patient being monitored Patient Re-evaluated:Patient Re-evaluated prior to induction Oxygen Delivery Method: Circle System Utilized Preoxygenation: Pre-oxygenation with 100% oxygen Induction Type: IV induction and Rapid sequence Laryngoscope Size: Glidescope and 4 Grade View: Grade II Tube type: Oral Tube size: 7.5 mm Number of attempts: 1 Airway Equipment and Method: Stylet and Oral airway Placement Confirmation: ETT inserted through vocal cords under direct vision,  positive ETCO2 and breath sounds checked- equal and bilateral Secured at: 22 cm Tube secured with: Tape Dental Injury: Teeth and Oropharynx as per pre-operative assessment  Comments: Glidescope utilized d/t laryngeal mass.

## 2018-11-08 NOTE — Brief Op Note (Signed)
11/08/2018  11:26 AM  PATIENT:  Gloria Murray  68 y.o. female  PRE-OPERATIVE DIAGNOSIS:  Laryngeal Mass  POST-OPERATIVE DIAGNOSIS:  Laryngeal Mass  PROCEDURE:  Procedure(s): DIRECT MICROLARYNGOSCOPY WITH BIOPSY (N/A) ESOPHAGOSCOPY (N/A)  SURGEON:  Surgeon(s) and Role:    * Melida Quitter, MD - Primary  PHYSICIAN ASSISTANT:   ASSISTANTS: none   ANESTHESIA:   general  EBL:  5 mL   BLOOD ADMINISTERED:none  DRAINS: none   LOCAL MEDICATIONS USED:  NONE  SPECIMEN:  Source of Specimen:  Left endolaryngeal supraglottis, left aryepiglottic fold, left vallecular tongue base, right vallecular epiglottis  DISPOSITION OF SPECIMEN:  PATHOLOGY  COUNTS:  YES  TOURNIQUET:  * No tourniquets in log *  DICTATION: .Other Dictation: Dictation Number [id number]  PLAN OF CARE: Discharge to home after PACU  PATIENT DISPOSITION:  PACU - hemodynamically stable.   Delay start of Pharmacological VTE agent (>24hrs) due to surgical blood loss or risk of bleeding: no

## 2018-11-09 ENCOUNTER — Encounter (HOSPITAL_COMMUNITY): Payer: Self-pay | Admitting: Otolaryngology

## 2018-11-15 ENCOUNTER — Telehealth: Payer: Self-pay | Admitting: Hematology

## 2018-11-15 ENCOUNTER — Encounter: Payer: Self-pay | Admitting: Hematology

## 2018-11-15 NOTE — Telephone Encounter (Signed)
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 Pt has been cld and scheduled to see Dr. Maylon Peppers on 4/22 at 1pm w/labs at 12:45pm. Letter mailed.

## 2018-11-16 ENCOUNTER — Other Ambulatory Visit: Payer: Self-pay | Admitting: Otolaryngology

## 2018-11-16 DIAGNOSIS — C321 Malignant neoplasm of supraglottis: Secondary | ICD-10-CM | POA: Insufficient documentation

## 2018-11-16 DIAGNOSIS — C329 Malignant neoplasm of larynx, unspecified: Secondary | ICD-10-CM

## 2018-11-16 NOTE — Progress Notes (Signed)
[place] CONSULT NOTE  Patient Care Team: Jearld Fenton, NP as PCP - General (Internal Medicine) Irene Shipper, MD as Consulting Physician (Gastroenterology) Early, Arvilla Meres, MD as Consulting Physician (Vascular Surgery) Serafina Mitchell, MD as Consulting Physician (Vascular Surgery) Eppie Gibson, MD as Attending Physician (Radiation Oncology) Leota Sauers, RN as Oncology Nurse Navigator (Oncology)  HEME/ONC OVERVIEW: 1. Squamous cell carcinoma of the supraglottic larynx, at least Stage IVB (cT2cN3bMx) -09/2018: CT neck showed thickening of the left aryepiglottic fold with bilateral cervical lymphadenopathy (eg. R Level II LN 5 x 3.3cm; L Level II LN 5.4 x 2.7cm w/ suspicion for ECE) -10/2018: DL with bx by Dr. Redmond Baseman showed a mass involving the left aryepiglottic fold extending up to the epiglottic and BOT; bx showed invasive squamous cell carcinoma  ASSESSMENT & PLAN:   Squamous cell carcinoma of the supraglottic larynx, at least Stage IVB (cT2cN3bMx) -I reviewed the patient's records in detail, including ENT clinic notes, lab studies, imaging results, and the pathology reports -I also independently reviewed the radiologic images of recent CT neck, and agree with findings as documented -In summary, patient presented to Dr. Constance Holster of ENT in 09/2018 for bilateral neck masses.  Laryngoscopy showed an exophytic supraglottic laryngeal mass, concerning for malignancy.  CT neck showed thickening of the left aryepiglottic fold with a bilateral bulky cervical adenopathy, including Level II LN's > 5cm suspicious for ECE.  Patient underwent DL with biopsy by Dr. Redmond Baseman in the [facility] in 10/2018, which showed invasive squamous cell carcinoma. -I discussed the imaging and pathology results in detail with the patient, as well as the NCCN guideline  -Given the locally advanced disease, it is important to rule out distant metastatic disease before discussing treatment options -Dr. Constance Holster has  ordered PET to complete the staging evaluation, currently scheduled on 12/05/2018  -I discussed the case with the H&N navigator, who will assist with possibly moving up the date of the PET scan appointment -Once we have the results from the PET scan, we will be able to determine the next step of plan  Polycythemia -Hgb just above 15, likely secondary to hx of smoking -We will monitor it for now  Cancer-related pain -Pain localized to the palate after recent DL; mild  -Recommend OTC Tylenol as needed for ow   No orders of the defined types were placed in this encounter.  A total of more than 60 minutes were spent face-to-face with the patient during this encounter and over half of that time was spent on counseling and coordination of care as outlined above.    All questions were answered. The patient knows to call the clinic with any problems, questions or concerns.  Tentatively return on 12/05/2018 for imaging results and clinic appt. Sooner pending the date of the PET scan.   Tish Men, MD 11/21/2018 1:58 PM  CHIEF COMPLAINTS/PURPOSE OF CONSULTATION:  "I am here to find out what next"  HISTORY OF PRESENTING ILLNESS:  Gloria Murray 68 y.o. female is here because of newly diagnosed cerumen cell carcinoma of the supraglottic larynx.  The patient reports that she first developed bilateral neck masses in 08/2018, but she denied any associated symptoms at that time.  The neck masses persisted for a few weeks, and then resolved without any intervention.  However, a few weeks later, these neck masses returned, and she began to experience hoarseness of voice and left-sided ear pain.  She presented to Dr. Constance Holster of ENT in 09/2018,  who performed an in-office laryngoscopy that showed supraglottic mass, concerning for malignancy.  CT neck showed bilateral bulky cervical adenopathy with suspicion for ECE.  She was taken to the OR by Dr. Redmond Baseman, who performed DL with biopsy that showed squamous cell  carcinoma.  Patient reports that she has had mild, localized pain to the back of the throat, where she recently had direct laryngoscopy with biopsy with Dr. Adonis Housekeeper.  The pain is persistent, nonradiating, and she has not needed to take any medication for the pain, such as Tylenol or ibuprofen.  It is intermittently exacerbated by eating solid food, such as meat, but she has been able to eat soft food to without any exacerbation of the pain.  In addition, she feels that since she first noticed the neck swelling, the left-sided neck swelling has improved somewhat while the right-sided neck swelling has gotten bigger.  It is nontender.  She has a mild intermittent bilateral ear ache smoking from May 2019.  Denies any alcohol or illicit drug use.  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  She lives by herself, does not have any family member that lives close by.  I have reviewed her chart and materials related to her cancer extensively and collaborated history with the patient. Summary of oncologic history is as follows:   Laryngeal cancer ([place])   10/26/2018 Imaging    CT neck w/ contrast: IMPRESSION: 1. Bulky, partially necrotic bilateral cervical lymphadenopathy most concerning for metastatic disease, less likely unusual infection. 2. Prominent soft tissue at the tongue base with asymmetric left aryepiglottic fold thickening and slight fullness of the left palatine tonsil. Direct visualization is recommended to assess for a primary mucosal malignancy. 3.  Aortic Atherosclerosis (ICD10-I70.0).    11/08/2018 Procedure    DL with biopsy    11/08/2018 Pathology Results    Accession: [id number] 8. Larynx, biopsy, Left Supraglottis - INVASIVE SQUAMOUS CELL CARCINOMA. 2. Larynx, biopsy, Left Ary Epiglottic Fold - INVASIVE SQUAMOUS CELL CARCINOMA. 3. Tongue, biopsy, Left Vallecular Base - INVASIVE SQUAMOUS CELL CARCINOMA. 4. Larynx, biopsy, Right Vallecular Epiglottis - INVASIVE  SQUAMOUS CELL CARCINOMA.    11/16/2018 Initial Diagnosis    Laryngeal cancer [facility])     MEDICAL HISTORY:  Past Medical History:  Diagnosis Date  . Allergy   . Carotid artery occlusion   . Heart murmur   . Hyperlipidemia   . Hypertension   . Hypothyroidism   . Laryngeal mass   . Osteoporosis   . Thyroid disease   . Wears dentures   . Wears glasses     SURGICAL HISTORY: Past Surgical History:  Procedure Laterality Date  . ARCH AORTOGRAM N/A 07/23/2014   Procedure: ARCH AORTOGRAM;  Surgeon: Rosetta Posner, MD;  Location: [facility] CATH LAB;  Service: Cardiovascular;  Laterality: N/A;  . CAROTID ANGIOGRAM N/A 07/23/2014   Procedure: CAROTID ANGIOGRAM;  Surgeon: Rosetta Posner, MD;  Location: [facility] CATH LAB;  Service: Cardiovascular;  Laterality: N/A;  . CAROTID ENDARTERECTOMY  12/11/2008   left  . CAROTID STENT INSERTION Left 10/14/2014   Procedure: CAROTID STENT INSERTION;  Surgeon: Serafina Mitchell, MD;  Location: [facility] CATH LAB;  Service: Cardiovascular;  Laterality: Left;   carotid  . CARPAL TUNNEL RELEASE     X 2, right wrist  . COLONOSCOPY  Jan. 2, 2015  . DILATION AND CURETTAGE OF UTERUS     X 2  . laceration hand Right    hand  . LARYNGOSCOPY N/A 11/08/2018  Procedure: DIRECT MICROLARYNGOSCOPY WITH BIOPSY;  Surgeon: Melida Quitter, MD;  Location: [place];  Service: ENT;  Laterality: N/A;  . RIGID ESOPHAGOSCOPY N/A 11/08/2018   Procedure: ESOPHAGOSCOPY;  Surgeon: Melida Quitter, MD;  Location: [place];  Service: ENT;  Laterality: N/A;  . THYROIDECTOMY    . TUBAL LIGATION  1975   bilateral    SOCIAL HISTORY: Social History   Socioeconomic History  . Marital status: Widowed    Spouse name: Not on file  . Number of children: Not on file  . Years of education: Not on file  . Highest education level: Not on file  Occupational History  . Occupation: Retail buyer: [employer]  . Financial resource strain: Not on file  . Food insecurity:    Worry: Not on file     Inability: Not on file  . Transportation needs:    Medical: Not on file    Non-medical: Not on file  Tobacco Use  . Smoking status: Former Smoker    Packs/day: 0.50    Types: Cigarettes    Last attempt to quit: 07/23/2014    Years since quitting: 4.3  . Smokeless tobacco: Never Used  . Tobacco comment: quit smoking cigarettes in " 2018-19"  Substance and Sexual Activity  . Alcohol use: No    Alcohol/week: 0.0 standard drinks  . Drug use: No  . Sexual activity: Never  Lifestyle  . Physical activity:    Days per week: Not on file    Minutes per session: Not on file  . Stress: Not on file  Relationships  . Social connections:    Talks on phone: Not on file    Gets together: Not on file    Attends religious service: Not on file    Active member of club or organization: Not on file    Attends meetings of clubs or organizations: Not on file    Relationship status: Not on file  . Intimate partner violence:    Fear of current or ex partner: Not on file    Emotionally abused: Not on file    Physically abused: Not on file    Forced sexual activity: Not on file  Other Topics Concern  . Not on file  Social History Narrative   Does not have a living will.   Does not have any family.      [place] Browns "make decisions" for her-desires CPR, does not want prolonged life support if futile.      Both of her children- died in car accident at ages 100 and 17- husband went into a diabetic coma while driving.    FAMILY HISTORY: Family History  Problem Relation Age of Onset  . Diabetes Mother   . Colon cancer Neg Hx     ALLERGIES:  is allergic to propoxyphene hcl and propoxyphene n-acetaminophen.  MEDICATIONS:  Current Outpatient Medications  Medication Sig Dispense Refill  . clopidogrel (PLAVIX) 75 MG tablet TAKE 1 TABLET (75 MG TOTAL) BY MOUTH DAILY. 90 tablet 3  . Ascorbic Acid (VITAMIN C PO) Take 1 tablet by mouth daily.    Marland Kitchen aspirin EC 81 MG tablet Take 81 mg by mouth daily.     Marland Kitchen CALCIUM PO Take 1 tablet by mouth daily.    . Cholecalciferol (VITAMIN D3 PO) Take 1 tablet by mouth daily.    . fexofenadine (ALLEGRA) 180 MG tablet Take 180 mg by mouth at bedtime.     Marland Kitchen  fluticasone (FLONASE) 50 MCG/ACT nasal spray Place 1 spray into both nostrils daily.     . Garlic 2376 MG CAPS Take 1 capsule by mouth 2 (two) times daily.      . hydrochlorothiazide (MICROZIDE) 12.5 MG capsule Take 1 capsule (12.5 mg total) by mouth daily. 90 capsule 0  . levothyroxine (SYNTHROID, LEVOTHROID) 112 MCG tablet Take 1 tablet (112 mcg total) by mouth daily. 90 tablet 0  . metoprolol succinate (TOPROL-XL) 25 MG 24 hr tablet Take 1 tablet (25 mg total) by mouth daily. 90 tablet 0  . Omega-3 Fatty Acids (FISH OIL) 1000 MG CAPS Take 1 capsule (1,000 mg total) by mouth daily.    . simvastatin (ZOCOR) 40 MG tablet Take 1 tablet (40 mg total) by mouth at bedtime. (Patient taking differently: Take 40 mg by mouth daily. ) 90 tablet 0  . VITAMIN E PO Take 1 capsule by mouth daily.     No current facility-administered medications for this visit.     REVIEW OF SYSTEMS:   Constitutional: ( - ) fevers, ( - )  chills , ( - ) night sweats Eyes: ( - ) blurriness of vision, ( - ) double vision, ( - ) watery eyes Ears, nose, mouth, throat, and face: ( - ) mucositis, ( - ) sore throat Respiratory: ( - ) cough, ( - ) dyspnea, ( - ) wheezes Cardiovascular: ( - ) palpitation, ( - ) chest discomfort, ( - ) lower extremity swelling Gastrointestinal:  ( - ) nausea, ( - ) heartburn, ( - ) change in bowel habits Skin: ( - ) abnormal skin rashes Lymphatics: ( + ) lymphadenopathy, ( - ) easy bruising Neurological: ( - ) numbness, ( - ) tingling, ( - ) new weaknesses Behavioral/Psych: ( - ) mood change, ( - ) new changes  All other systems were reviewed with the patient and are negative.  PHYSICAL EXAMINATION: ECOG PERFORMANCE STATUS: 1 - Symptomatic but completely ambulatory  Vitals:   11/21/18 1304  BP:  110/83  Pulse: (!) 117  Resp: 17  Temp: 98.6 F (37 C)  SpO2: 97%   Filed Weights   11/21/18 1304  Weight: 215 lb 6.4 oz (97.7 kg)    GENERAL: alert, no distress and comfortable, older than stated age  SKIN: skin color, texture, turgor are normal, no rashes or significant lesions EYES: conjunctiva are pink and non-injected, sclera clear OROPHARYNX: no exudate, no erythema; lips, buccal mucosa, and tongue normal  NECK: supple, non-tender LYMPH:  bilateral, bulky, firm, fixed, cervical adenopathy measuring >5cm  LUNGS: clear to auscultation with normal breathing effort HEART: regular rate & rhythm, no murmurs, no lower extremity edema ABDOMEN: soft, non-tender, non-distended, normal bowel sounds Musculoskeletal: no cyanosis of digits and no clubbing  PSYCH: alert & oriented x 3, fluent speech NEURO: no focal motor/sensory deficits  LABORATORY DATA:  I have reviewed the data as listed Lab Results  Component Value Date   WBC 6.2 11/21/2018   HGB 15.1 (H) 11/21/2018   HCT 45.4 11/21/2018   MCV 94.4 11/21/2018   PLT 340 11/21/2018   Lab Results  Component Value Date   NA 143 11/21/2018   K 3.9 11/21/2018   CL 103 11/21/2018   CO2 29 11/21/2018    RADIOGRAPHIC STUDIES: I have personally reviewed the radiological images as listed and agreed with the findings in the report. Ct Soft Tissue Neck W Contrast  Result Date: 10/26/2018 CLINICAL DATA:  Bilateral neck swelling. Laryngitis. Sinus drainage.  History of skin cancer and left carotid stenting. EXAM: CT NECK WITH CONTRAST TECHNIQUE: Multidetector CT imaging of the neck was performed using the standard protocol following the bolus administration of intravenous contrast. CONTRAST:  53mL ISOVUE-300 IOPAMIDOL (ISOVUE-300) INJECTION 61% COMPARISON:  None. FINDINGS: Pharynx and larynx: Bilateral tonsillar calcification. There is mildly prominent soft tissue at the tongue base/vallecula bilaterally which is partially inseparable from  the epiglottis with asymmetric thickening of the left aryepiglottic fold, however note that motion artifact mildly limits assessment of this region. There is asymmetric sclerosis of the left arytenoid cartilage. There is also slight asymmetric prominence of the left palatine tonsillar soft tissues without a discrete mass identified. The airway is patent. No retropharyngeal fluid collection is present. Salivary glands: No inflammation, mass, or stone. Thyroid: Status post thyroidectomy. Lymph nodes: Bilateral, partially cystic/necrotic level IIa lymph nodes measure 5.0 x 3.3 cm on the right and 5.4 x 2.7 cm on the left. These lymph nodes are partially inseparable from the sternocleidomastoid muscles with extracapsular extension and invasion not excluded. Smaller level II lymph nodes are present bilaterally, and a left level III lymph node measures 11 mm in short axis. Vascular: Heavy atherosclerotic calcification involving the aortic arch. Stent in the distal left common carotid artery. Encasement of the proximal left ICA by the nodal mass. Limited intracranial: Unremarkable. Visualized orbits: Unremarkable. Mastoids and visualized paranasal sinuses: Right maxillary sinus mucous retention cyst. Visualized mastoid air cells are clear. Skeleton: Advanced multilevel disc and facet degeneration in the cervical spine. No suspicious osseous lesion. Upper chest: Clear lung apices. Other: None. IMPRESSION: 1. Bulky, partially necrotic bilateral cervical lymphadenopathy most concerning for metastatic disease, less likely unusual infection. 2. Prominent soft tissue at the tongue base with asymmetric left aryepiglottic fold thickening and slight fullness of the left palatine tonsil. Direct visualization is recommended to assess for a primary mucosal malignancy. 3.  Aortic Atherosclerosis (ICD10-I70.0). These results will be called to the ordering clinician or representative by the Radiologist Assistant, and communication  documented in the PACS or zVision Dashboard. Electronically Signed   By: Logan Bores M.D.   On: 10/26/2018 14:35    PATHOLOGY: I have reviewed the pathology reports as documented in the oncologist history.

## 2018-11-20 ENCOUNTER — Telehealth: Payer: Self-pay | Admitting: *Deleted

## 2018-11-20 ENCOUNTER — Other Ambulatory Visit: Payer: Self-pay | Admitting: Hematology

## 2018-11-20 DIAGNOSIS — C329 Malignant neoplasm of larynx, unspecified: Secondary | ICD-10-CM

## 2018-11-20 NOTE — Progress Notes (Signed)
Head and Neck Cancer Location of Tumor / Histology:  11/08/18 Diagnosis 1. Larynx, biopsy, Left Supraglottis - INVASIVE SQUAMOUS CELL CARCINOMA. 2. Larynx, biopsy, Left Ary Epiglottic Fold - INVASIVE SQUAMOUS CELL CARCINOMA. 3. Tongue, biopsy, Left Vallecular Base - INVASIVE SQUAMOUS CELL CARCINOMA. 4. Larynx, biopsy, Right Vallecular Epiglottis - INVASIVE SQUAMOUS CELL CARCINOMA.  Patient presented months ago with symptoms of: per Dr. Janeice Robinson note 10/29/18: Back in January she developed bilateral neck masses. She had no other symptoms at the time. This lasted for a few weeks and then according to the patient all resolved. A few weeks later the neck masses returned and then she started having hoarseness and left ear pain. That has persisted now for 3-4 weeks.  Biopsies of left supraglottis, left Ary epiglottic fold, left vallecular base, right vallecular epiglottis revealed: Invasive cell carcinoma.   Nutrition Status Yes No Comments  Weight changes? [x]  []  She reports about a 10-15 weight loss since biopsy.   Swallowing concerns? [x]  []  She is swallowing ok.   PEG? []  [x]     Referrals Yes No Comments  Social Work? []  [x]    Dentistry? [x]  []  Dr. Enrique Sack 11/22/18  Swallowing therapy? []  []    Nutrition? []  []    Med/Onc? [x]  []  Dr. Maylon Peppers 11/21/18   Safety Issues Yes No Comments  Prior radiation? []  [x]    Pacemaker/ICD? []  [x]    Possible current pregnancy? []  [x]    Is the patient on methotrexate? []  [x]     Tobacco/Marijuana/Snuff/ETOH use: She quit smoking 2018-2019. She does not drink alcohol.   Past/Anticipated interventions by otolaryngology, if any: 10/29/18 Dr. Constance Holster Impression & Plans:  Left supraglottic mass with bilateral adenopathy. This is worrisome for an advanced staged carcinoma. Recommend direct laryngoscopy with biopsy under anesthesia for surgical mapping, and for tissue diagnosis. We will discuss results after that and consider PET scan and consultation with medical and  radiation oncology.  11/08/18 PROCEDURE: 1.  Microdirect laryngoscopy with biopsy. 2.  Esophagoscopy. SURGEON:  Melida Quitter, MD   Past/Anticipated interventions by medical oncology, if any:  Dr. Maylon Peppers 11/21/18 ASSESSMENT & PLAN:  Squamous cell carcinoma of the supraglottic larynx, at least Stage IVB (cT2cN3bMx) -I reviewed the patient's records in detail, including ENT clinic notes, lab studies, imaging results, and the pathology reports -I also independently reviewed the radiologic images of recent CT neck, and agree with findings as documented -In summary, patient presented to Dr. Constance Holster of ENT in 09/2018 for bilateral neck masses.  Laryngoscopy showed an exophytic supraglottic laryngeal mass, concerning for malignancy.  CT neck showed thickening of the left aryepiglottic fold with a bilateral bulky cervical adenopathy, including Level II LN's > 5cm suspicious for ECE.  Patient underwent DL with biopsy by Dr. Redmond Baseman in the [place] in 10/2018, which showed invasive squamous cell carcinoma. -I discussed the imaging and pathology results in detail with the patient, as well as the NCCN guideline  -Given the locally advanced disease, it is important to rule out distant metastatic disease before discussing treatment options -Dr. Constance Holster has ordered PET to complete the staging evaluation, currently scheduled on 12/05/2018  -I discussed the case with the H&N navigator, who will assist with possibly moving up the date of the PET scan appointment -Once we have the results from the PET scan, we will be able to determine the next step of plan    Current Complaints / other details:

## 2018-11-20 NOTE — Telephone Encounter (Signed)
Oncology Nurse Navigator Documentation  Placed introductory call to new referral patient Gloria Murray.  Introduced myself as the H&N oncology nurse navigator that works with Drs. Isidore Moos and Maylon Peppers to whom she has been referred by ENT Dr. Constance Holster.  She  confirmed understanding of referrals including referral to Dental Medicine.  Briefly explained my role as her navigator, provided my contact information.   Confirmed understanding of upcoming appt dates/times and [place] location, explained arrival and registration process for tomorrow's lab and consult with Dr. Maylon Peppers, Thursday's appt with Dr. Enrique Sack.  Explained Friday's appt with Dr. Isidore Moos will be by telephone and preceded by call from RN [facility].   I encouraged her to call with questions/concerns as he/she moves forward with appts and procedures.    She verbalized understanding of information provided, expressed appreciation for my call.  Navigator Needs Assessment . 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  I encouraged her to bring FMLA and STD paperwork to an upcoming appt. . Support system:  Anda Latina, noted as Contact. . Transportation: Has own car, will need transportation assistance as tmts progress.  I informed her  [facility]-coordinated LYFT service will be available when needed. Marland Kitchen PCP:  Webb Silversmith, [place].  Identified abnormality, referred her to ENT Constance Holster. Marland Kitchen PCD:  None.  Has only 2 teeth, R front; full upper plate, bottom partial.  Gayleen Orem, RN, BSN Head & Neck Oncology Nurse [place] at [place] [phone]

## 2018-11-21 ENCOUNTER — Encounter: Payer: Self-pay | Admitting: Hematology

## 2018-11-21 ENCOUNTER — Other Ambulatory Visit: Payer: Self-pay

## 2018-11-21 ENCOUNTER — Telehealth: Payer: Self-pay | Admitting: *Deleted

## 2018-11-21 ENCOUNTER — Inpatient Hospital Stay: Payer: Medicare HMO | Attending: Hematology | Admitting: Hematology

## 2018-11-21 ENCOUNTER — Telehealth: Payer: Self-pay | Admitting: Hematology

## 2018-11-21 ENCOUNTER — Inpatient Hospital Stay: Payer: Medicare HMO

## 2018-11-21 VITALS — BP 110/83 | HR 117 | Temp 98.6°F | Resp 17 | Ht 65.0 in | Wt 215.4 lb

## 2018-11-21 DIAGNOSIS — M810 Age-related osteoporosis without current pathological fracture: Secondary | ICD-10-CM | POA: Insufficient documentation

## 2018-11-21 DIAGNOSIS — Z7982 Long term (current) use of aspirin: Secondary | ICD-10-CM | POA: Insufficient documentation

## 2018-11-21 DIAGNOSIS — I10 Essential (primary) hypertension: Secondary | ICD-10-CM

## 2018-11-21 DIAGNOSIS — D751 Secondary polycythemia: Secondary | ICD-10-CM | POA: Diagnosis not present

## 2018-11-21 DIAGNOSIS — C329 Malignant neoplasm of larynx, unspecified: Secondary | ICD-10-CM

## 2018-11-21 DIAGNOSIS — R011 Cardiac murmur, unspecified: Secondary | ICD-10-CM | POA: Diagnosis not present

## 2018-11-21 DIAGNOSIS — Z87891 Personal history of nicotine dependence: Secondary | ICD-10-CM | POA: Diagnosis not present

## 2018-11-21 DIAGNOSIS — I700 Atherosclerosis of aorta: Secondary | ICD-10-CM | POA: Diagnosis not present

## 2018-11-21 DIAGNOSIS — E039 Hypothyroidism, unspecified: Secondary | ICD-10-CM | POA: Diagnosis not present

## 2018-11-21 DIAGNOSIS — G893 Neoplasm related pain (acute) (chronic): Secondary | ICD-10-CM | POA: Insufficient documentation

## 2018-11-21 DIAGNOSIS — C321 Malignant neoplasm of supraglottis: Secondary | ICD-10-CM | POA: Diagnosis not present

## 2018-11-21 DIAGNOSIS — Z79899 Other long term (current) drug therapy: Secondary | ICD-10-CM | POA: Diagnosis not present

## 2018-11-21 DIAGNOSIS — E785 Hyperlipidemia, unspecified: Secondary | ICD-10-CM

## 2018-11-21 DIAGNOSIS — Z85828 Personal history of other malignant neoplasm of skin: Secondary | ICD-10-CM | POA: Insufficient documentation

## 2018-11-21 DIAGNOSIS — R070 Pain in throat: Secondary | ICD-10-CM

## 2018-11-21 LAB — CBC WITH DIFFERENTIAL (CANCER CENTER ONLY)
Abs Immature Granulocytes: 0.02 10*3/uL (ref 0.00–0.07)
Basophils Absolute: 0 10*3/uL (ref 0.0–0.1)
Basophils Relative: 1 %
Eosinophils Absolute: 0.1 10*3/uL (ref 0.0–0.5)
Eosinophils Relative: 1 %
HCT: 45.4 % (ref 36.0–46.0)
Hemoglobin: 15.1 g/dL — ABNORMAL HIGH (ref 12.0–15.0)
Immature Granulocytes: 0 %
Lymphocytes Relative: 17 %
Lymphs Abs: 1.1 10*3/uL (ref 0.7–4.0)
MCH: 31.4 pg (ref 26.0–34.0)
MCHC: 33.3 g/dL (ref 30.0–36.0)
MCV: 94.4 fL (ref 80.0–100.0)
Monocytes Absolute: 0.4 10*3/uL (ref 0.1–1.0)
Monocytes Relative: 7 %
Neutro Abs: 4.6 10*3/uL (ref 1.7–7.7)
Neutrophils Relative %: 74 %
Platelet Count: 340 10*3/uL (ref 150–400)
RBC: 4.81 MIL/uL (ref 3.87–5.11)
RDW: 13.1 % (ref 11.5–15.5)
WBC Count: 6.2 10*3/uL (ref 4.0–10.5)
nRBC: 0 % (ref 0.0–0.2)

## 2018-11-21 LAB — CMP (CANCER CENTER ONLY)
ALT: 17 U/L (ref 0–44)
AST: 15 U/L (ref 15–41)
Albumin: 4.1 g/dL (ref 3.5–5.0)
Alkaline Phosphatase: 67 U/L (ref 38–126)
Anion gap: 11 (ref 5–15)
BUN: 18 mg/dL (ref 8–23)
CO2: 29 mmol/L (ref 22–32)
Calcium: 9.9 mg/dL (ref 8.9–10.3)
Chloride: 103 mmol/L (ref 98–111)
Creatinine: 0.82 mg/dL (ref 0.44–1.00)
GFR, Est AFR Am: >60 mL/min (ref >60)
GFR, Estimated: >60 mL/min (ref >60)
Glucose, Bld: 124 mg/dL — ABNORMAL HIGH (ref 70–99)
Potassium: 3.9 mmol/L (ref 3.5–5.1)
Sodium: 143 mmol/L (ref 135–145)
Total Bilirubin: 0.4 mg/dL (ref 0.3–1.2)
Total Protein: 7.2 g/dL (ref 6.5–8.1)

## 2018-11-21 NOTE — Telephone Encounter (Addendum)
Oncology Nurse Navigator Documentation  Called Gloria Murray to provide updated PET appt and to follow-up on her appt with Dr. Maylon Peppers earlier today.  Informed her PET now scheduled for this Friday, 3:00, [facility], with 2:30 arrival.  Reviewed low-carb meal guidance prior to NPO 6 hrs prior to 3:00 scan.  I again explained location of [facility], encouraged her to locate after tomorrow morning's Dental Medicine appt.  Reviewed her understanding of discussion with Dr. Maylon Peppers, including tmt options and importance of PET scan in determining treatment plan, addressed her questions re PEG and port-a-cath.  Informed her a New Patient Packet will be delivered to Dental Medicine tomorrow, explained I will call her to review contents which will include PEG and port-a-cath educational handouts.   She confirmed understanding she will receive calls at 8:30 and 9:00 on Friday from [employer] and Dr. Isidore Moos, respectively, vs physical visit to [facility]. She voiced understanding of information, expressed appreciation for my call.  Gayleen Orem, RN, BSN Head & Neck Oncology Nurse [place] at [place] [phone]

## 2018-11-21 NOTE — Patient Instructions (Signed)
Throat Cancer  Throat cancer (oropharyngeal cancer) is an abnormal growth of cancerous cells (malignant tumors) in the throat. Tumors may affect the tonsils, the back of the throat (pharynx), or the voice box (larynx). What are the causes? The exact cause of throat cancer is not known. What increases the risk? You are more likely to develop throat cancer if you:  Use any tobacco products, including cigarettes, chewing tobacco, and e-cigarettes.  Have a history of HPV (human papillomavirus).  Drink alcohol excessively.  Have combined lifestyle habits of alcohol and tobacco use.  Eat a diet that is low in fruits and vegetables.  Are African American.  Are female.  Are older than age 37.  Have been exposed to certain chemicals such as nickel, asbestos, or sulfuric acid fumes. What are the signs or symptoms? Symptoms may include:  Sore throat.  A lump, growth, or sore in the throat that does not get better.  Trouble swallowing.  Ear pain.  Cough.  Weight loss without trying (unintentional weight loss).  Hoarse voice that does not go away. How is this diagnosed? This condition may be diagnosed based on:  Your symptoms and medical history.  A physical exam of the throat. A tube with a light and a camera on the end of it (endoscope or laryngoscope) may be used to examine your throat.  Imaging tests, such as: ? CT scan of the throat. ? PET scan. ? MRI. ? X-rays of the chest and mouth.  Removal of a sample of tumor tissue to be tested (biopsy).  Tests to determine how well you are able to swallow, such as: ? Barium swallow. This is a procedure in which you swallow a solution (barium) before X-rays are done to evaluate your throat and other structures. The barium shows up well on X-rays, making it easier for your health care provider to see possible problems. ? Laryngeal videostroboscopy. During this exam, your health care provider uses an endoscope to take a video of  your vocal cords in motion. ? Fiberoptic endoscopic examination (FEES): During this procedure, a small, flexible endoscope is inserted through your nose to examine your ability to swallow. Your cancer will be assessed (staged) to determine how severe it is and how much it has spread (metastasized). How is this treated? Treatment for throat cancer depends on the type and stage of the cancer. Treatment may include one or more of the following:  Surgery to remove as much of the cancer as possible. This surgery may also involve removing lymph nodes in the area to be checked for cancer cells.  Medicines that kill cancer cells (chemotherapy).  High-energy rays that kill cancer cells (radiation therapy).  Targeted therapy. This targets specific parts of cancer cells and the area around them to block the growth and the spread of the cancer. Targeted therapy can help to limit the damage to healthy cells.  Medicines that help your body's disease-fighting system (immune system) fight cancer cells (immunotherapy). Follow these instructions at home: Lifestyle  Do not drink alcohol.  Do not use any products that contain nicotine or tobacco, such as cigarettes and e-cigarettes. If you need help quitting, ask your health care provider. Eating and drinking  Some of your treatments might affect your appetite and your ability to chew and swallow. If you are having problems eating, or if you do not have an appetite, meet with a diet and nutrition specialist (dietitian).  If you have side effects that affect eating, it may help to: ?  Eat smaller meals and snacks often. ? Drink high-nutrition and high-calorie shakes or supplements. ? Eat bland and soft foods that are easy to eat. ? Not eat foods that are hot, spicy, or hard to swallow. General instructions  Take over-the-counter and prescription medicines only as told by your health care provider.  Consider joining a support group for people who have  been diagnosed with throat cancer.  Work with your health care provider to manage any side effects of treatment.  Keep all follow-up visits as told by your health care provider. This is important. Where to find more information  American Cancer Society: www.cancer.[place] ([place]): www.cancer.gov Contact a health care provider if:  You have a fever.  You continue to lose weight without trying.  You have more problems chewing or swallowing.  You have new fatigue or weakness.  You have new symptoms, or your symptoms get worse. Get help right away if:  You cough up blood.  You have trouble breathing.  You faint.  You have pain that suddenly gets worse. Summary  Throat cancer (oropharyngeal cancer) is an abnormal growth of cancerous cells (malignant tumors) in the throat.  Tumors may affect the tonsils, the back of the throat (pharynx), or the voice box (larynx).  Your cancer will be assessed (staged) to determine how severe it is and how much it has spread (metastasized).  Work with your health care provider to manage any side effects of treatment. This information is not intended to replace advice given to you by your health care provider. Make sure you discuss any questions you have with your health care provider. Document Released: 06/30/2005 Document Revised: 05/01/2017 Document Reviewed: 05/01/2017 Elsevier Interactive Patient Education  2019 [name].

## 2018-11-21 NOTE — Telephone Encounter (Signed)
Scheduled appt per 4/22.

## 2018-11-22 ENCOUNTER — Encounter (HOSPITAL_COMMUNITY): Payer: Self-pay | Admitting: Dentistry

## 2018-11-22 ENCOUNTER — Ambulatory Visit (HOSPITAL_COMMUNITY): Payer: Self-pay | Admitting: Dentistry

## 2018-11-22 ENCOUNTER — Telehealth: Payer: Self-pay | Admitting: Hematology

## 2018-11-22 VITALS — BP 106/84 | HR 105 | Temp 98.5°F

## 2018-11-22 DIAGNOSIS — C321 Malignant neoplasm of supraglottis: Secondary | ICD-10-CM | POA: Diagnosis not present

## 2018-11-22 DIAGNOSIS — Z01818 Encounter for other preprocedural examination: Secondary | ICD-10-CM

## 2018-11-22 DIAGNOSIS — K036 Deposits [accretions] on teeth: Secondary | ICD-10-CM

## 2018-11-22 DIAGNOSIS — K06010 Localized gingival recession, unspecified: Secondary | ICD-10-CM

## 2018-11-22 DIAGNOSIS — J341 Cyst and mucocele of nose and nasal sinus: Secondary | ICD-10-CM

## 2018-11-22 DIAGNOSIS — C329 Malignant neoplasm of larynx, unspecified: Secondary | ICD-10-CM

## 2018-11-22 DIAGNOSIS — K0889 Other specified disorders of teeth and supporting structures: Secondary | ICD-10-CM

## 2018-11-22 DIAGNOSIS — Z972 Presence of dental prosthetic device (complete) (partial): Secondary | ICD-10-CM

## 2018-11-22 DIAGNOSIS — K0530 Chronic periodontitis, unspecified: Secondary | ICD-10-CM

## 2018-11-22 DIAGNOSIS — K08409 Partial loss of teeth, unspecified cause, unspecified class: Secondary | ICD-10-CM

## 2018-11-22 MED ORDER — SODIUM FLUORIDE 1.1 % DT CREA: TOPICAL_CREAM | DENTAL | 99 refills | Status: AC

## 2018-11-22 NOTE — Telephone Encounter (Signed)
Per 4/22 schedule message moved 5/6 appointment to 4/29. Left message for patient. Schedule mailed.

## 2018-11-22 NOTE — Patient Instructions (Signed)

## 2018-11-22 NOTE — Progress Notes (Signed)
DENTAL CONSULTATION  Date of Consultation:  11/22/2018 Patient Name:   Gloria Murray Date of Birth:   Oct 28, 1950 Medical Record Number: [id number]  VITALS: BP 106/84 (BP Location: Right Arm)   Pulse (!) 105   Temp 98.5 F (36.9 C)   CHIEF COMPLAINT: Patient referred by Dr. Isidore Moos for a dental consultation.  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 Patient with anticipated chemoradiation therapy. Patient is now seen as part of a pre-chemoradiation therapy dental protocol examination.  The patient currently denies acute toothaches, swellings, or abscesses. Patient was last seen 4-5 years ago for a dental extraction involving a lower left tooth. Patient then had addition of the denture tooth to the partial denture.  Patient indicates this was done at [facility].  Patient has an upper complete denture and lower cast partial denture. The dentures were previously fabricated approximately 10-20 years ago by report.  Patient indicates the upper denture or lower partial denture "fit fine".  Patient does indicate that the upper right complete denture flange has fractured out including denture tooth #2. This does not affect the retention and stability of the upper complete denture by patient report.  Patient does NOT seek regular dental care.  Patient denies having dental phobia.  PROBLEM LIST: Patient Active Problem List   Diagnosis Date Noted  . Laryngeal cancer ([place]) 11/16/2018    Priority: High  . Osteoporosis 07/07/2017  . Seasonal allergies 07/07/2017  . Carotid stenosis 10/14/2014  . Hypothyroidism 01/04/2007  . HYPERLIPIDEMIA, MIXED, MILD 01/04/2007  . HYPERTENSION, BENIGN ESSENTIAL 01/04/2007    PMH: Past Medical History:  Diagnosis Date  . Allergy   . Carotid artery occlusion   . Heart murmur   . Hyperlipidemia   . Hypertension   . Hypothyroidism   . Laryngeal mass   . Osteoporosis   . Thyroid  disease   . Wears dentures   . Wears glasses     PSH: Past Surgical History:  Procedure Laterality Date  . ARCH AORTOGRAM N/A 07/23/2014   Procedure: ARCH AORTOGRAM;  Surgeon: Rosetta Posner, MD;  Location: [facility] CATH LAB;  Service: Cardiovascular;  Laterality: N/A;  . CAROTID ANGIOGRAM N/A 07/23/2014   Procedure: CAROTID ANGIOGRAM;  Surgeon: Rosetta Posner, MD;  Location[facility] CATH LAB;  Service: Cardiovascular;  Laterality: N/A;  . CAROTID ENDARTERECTOMY  12/11/2008   left  . CAROTID STENT INSERTION Left 10/14/2014   Procedure: CAROTID STENT INSERTION;  Surgeon: Serafina Mitchell, MD;  Location: [facility] CATH LAB;  Service: Cardiovascular;  Laterality: Left;   carotid  . CARPAL TUNNEL RELEASE     X 2, right wrist  . COLONOSCOPY  Jan. 2, 2015  . DILATION AND CURETTAGE OF UTERUS     X 2  . laceration hand Right    hand  . LARYNGOSCOPY N/A 11/08/2018   Procedure: DIRECT MICROLARYNGOSCOPY WITH BIOPSY;  Surgeon: Melida Quitter, MD;  Location: [place];  Service: ENT;  Laterality: N/A;  . RIGID ESOPHAGOSCOPY N/A 11/08/2018   Procedure: ESOPHAGOSCOPY;  Surgeon: Melida Quitter, MD;  Location: [place];  Service: ENT;  Laterality: N/A;  . THYROIDECTOMY    . TUBAL LIGATION  1975   bilateral    ALLERGIES: Allergies  Allergen Reactions  . Propoxyphene Hcl Itching and Nausea Only    REACTION: nausea and itch  . Propoxyphene N-Acetaminophen Itching and Nausea Only    REACTION: nausea and  itch  MEDICATIONS: Current Outpatient Medications  Medication Sig Dispense Refill  . clopidogrel (PLAVIX) 75 MG tablet TAKE 1 TABLET (75 MG TOTAL) BY MOUTH DAILY. 90 tablet 3  . hydrochlorothiazide (MICROZIDE) 12.5 MG capsule Take 1 capsule (12.5 mg total) by mouth daily. 90 capsule 0  . levothyroxine (SYNTHROID, LEVOTHROID) 112 MCG tablet Take 1 tablet (112 mcg total) by mouth daily. 90 tablet 0  . metoprolol succinate (TOPROL-XL) 25 MG 24 hr tablet Take 1 tablet (25 mg total) by mouth daily. 90 tablet 0  . Ascorbic Acid  (VITAMIN C PO) Take 1 tablet by mouth daily.    Marland Kitchen aspirin EC 81 MG tablet Take 81 mg by mouth daily.    Marland Kitchen CALCIUM PO Take 1 tablet by mouth daily.    . Cholecalciferol (VITAMIN D3 PO) Take 1 tablet by mouth daily.    . fexofenadine (ALLEGRA) 180 MG tablet Take 180 mg by mouth at bedtime.     . fluticasone (FLONASE) 50 MCG/ACT nasal spray Place 1 spray into both nostrils daily.     . Garlic 8416 MG CAPS Take 1 capsule by mouth 2 (two) times daily.      . Omega-3 Fatty Acids (FISH OIL) 1000 MG CAPS Take 1 capsule (1,000 mg total) by mouth daily.    . simvastatin (ZOCOR) 40 MG tablet Take 1 tablet (40 mg total) by mouth at bedtime. (Patient taking differently: Take 40 mg by mouth daily. ) 90 tablet 0  . VITAMIN E PO Take 1 capsule by mouth daily.     No current facility-administered medications for this visit.     LABS: Lab Results  Component Value Date   WBC 6.2 11/21/2018   HGB 15.1 (H) 11/21/2018   HCT 45.4 11/21/2018   MCV 94.4 11/21/2018   PLT 340 11/21/2018      Component Value Date/Time   NA 143 11/21/2018 1251   K 3.9 11/21/2018 1251   CL 103 11/21/2018 1251   CO2 29 11/21/2018 1251   GLUCOSE 124 (H) 11/21/2018 1251   BUN 18 11/21/2018 1251   CREATININE 0.82 11/21/2018 1251   CREATININE 0.89 10/06/2017 1504   CALCIUM 9.9 11/21/2018 1251   GFRNONAA >60 11/21/2018 1251   GFRAA >60 11/21/2018 1251   Lab Results  Component Value Date   INR 0.9 12/10/2008   No results found for: PTT  SOCIAL HISTORY: Social History   Socioeconomic History  . Marital status: Widowed    Spouse name: Not on file  . Number of children: Not on file  . Years of education: Not on file  . Highest education level: Not on file  Occupational History  . Occupation: Retail buyer: [employer]  . Financial resource strain: Not on file  . Food insecurity:    Worry: Not on file    Inability: Not on file  . Transportation needs:    Medical: Not on file    Non-medical:  Not on file  Tobacco Use  . Smoking status: Former Smoker    Packs/day: 0.50    Types: Cigarettes    Last attempt to quit: 07/23/2014    Years since quitting: 4.3  . Smokeless tobacco: Never Used  . Tobacco comment: quit smoking cigarettes in " 2018-19"  Substance and Sexual Activity  . Alcohol use: No    Alcohol/week: 0.0 standard drinks  . Drug use: No  . Sexual activity: Never  Lifestyle  . Physical activity:    Days per  week: Not on file    Minutes per session: Not on file  . Stress: Not on file  Relationships  . Social connections:    Talks on phone: Not on file    Gets together: Not on file    Attends religious service: Not on file    Active member of club or organization: Not on file    Attends meetings of clubs or organizations: Not on file    Relationship status: Not on file  . Intimate partner violence:    Fear of current or ex partner: Not on file    Emotionally abused: Not on file    Physically abused: Not on file    Forced sexual activity: Not on file  Other Topics Concern  . Not on file  Social History Narrative   Does not have a living will.   Does not have any family.      [place] "make decisions" for her-desires CPR, does not want prolonged life support if futile.      Both of her children- died in car accident at ages 60 and 60- husband went into a diabetic coma while driving.    FAMILY HISTORY: Family History  Problem Relation Age of Onset  . Diabetes Mother   . Colon cancer Neg Hx     REVIEW OF SYSTEMS: Reviewed with the patient as per History of present illness. Psych: Patient denies having dental phobia.  DENTAL HISTORY: CHIEF COMPLAINT: Patient referred by Dr. Isidore Moos for a dental consultation.  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 Patient with anticipated chemoradiation therapy. Patient is now seen as part of a pre-chemoradiation  therapy  dental protocol examination.  The patient currently denies acute toothaches, swellings, or abscesses. Patient was last seen 4-5 years ago for a dental extraction involving a lower left tooth. Patient then had addition of the denture tooth to the partial denture.  Patient indicates this was done at [place].  Patient has an upper complete denture and lower cast partial denture. The dentures were previously fabricated approximately 10-20 years ago by report.  Patient indicates the upper denture or lower partial denture "fit fine".  Patient does indicate that the upper right complete denture flange has fractured out including denture tooth #2. This does not affect the retention and stability of the upper complete denture by patient report.  Patient does NOT seek regular dental care.  Patient denies having dental phobia.  DENTAL EXAMINATION: GENERAL:  The patient is a well-developed, well-nourished female in no acute distress. HEAD AND NECK:  Patient has significant right and left neck lymphadenopathy. The patient denies having any acute TMJ symptoms.  Patient has a maximum interincisal opening of 40 mm. INTRAORAL EXAM:  Patient has normal saliva. Patient has some soft tissue irritation in the area of the incisal papillae. Patient indicates that this occurred during her recent direct laryngoscopy and biopsy.  I will need to rule out denture irritation at this time. Patient does have some denture stomatitis along the alveolar ridge in the area of tooth numbers 22 through 25.  Patient denies taking her dentures out at bedtime. Patient was encouraged to do so and patient was given several denture cups to facilitate this practice. DENTITION: Patient is missing all teeth with the exception of tooth numbers 26 and 27. PERIODONTAL:  Patient has chronic periodontitis with plaque and calculus accumulations, gingival recession, and incipient tooth mobility as per  dental charting form. DENTAL CARIES/SUBOPTIMAL  RESTORATIONS: no dental caries are noted. ENDODONTIC: The patient denies acute pulpitis symptoms. I do not see any evidence of periapical pathology or radiolucency. CROWN AND BRIDGE:   There are no crown or bridge restorations. PROSTHODONTIC:  Patient has an upper complete denture and lower cast partial denture. These are both stable and retentive.  Both dentures have worn denture teeth.  The upper denture has a broken flange in the upper right quadrant including denture tooth #2.  Pressure indicating paste was applied to the incisive papilla area of the upper denture. This was adjusted minimally as needed. OCCLUSION:  The occlusion of the dentures is stable at this time.  RADIOGRAPHIC INTERPRETATION: An orthopantogram was taken and supplemented with 1 periapical radiograph in the area tooth numbers 26/27. There are multiple missing teeth with the exception of tooth numbers 26 and 27. There is moderate bone loss. There is radiographic calculus noted. There is atrophy of the edentulous alveolar ridges. There is no obvious periapical pathology or radiolucency. There is a mucus retention cyst involving the maxillary right sinus.the patient has a trabecular bone pattern consistent with osteoporosis. There is pneumatization of the bilateral maxillary sinuses.   ASSESSMENTS: 1. Squamous cell carcinoma of the supraglottic larynx 2. Pre-chemoradiation therapy dental protocol examination 3. Chronic periodontitis of bone loss 4. Gingival recession 5. Accretions 6. Incipient tooth mobility 7. Multiple missing teeth 8. Maxillary complete and mandibular cast partial denture 9. Trauma to the incisive papilla of the maxilla 10. Denture stomatitis involving the alveolar ridge in the area of tooth numbers 22 through 25.  11. Broken upper right denture flange of the upper complete denture including denture tooth #2 12.  Worn denture teeth of the upper complete and lower cast partial denture. 13. Stable denture  occlusion. 14. Risk for bleeding with invasive dental procedures due to current Plavix therapy 15. Mucus retention cyst involving the upper right and maxillary sinus.  PLAN/RECOMMENDATIONS: 1. I discussed the risks, benefits, and complications of various treatment options with the patient in relationship to her medical and dental conditions, anticipated chemoradiation therapy, and chemoradiation therapy side effects to include xerostomia, radiation caries, trismus, mucositis, taste changes, gum and jawbone changes, and risk for infection and osteoradionecrosis. We discussed various treatment options to include no treatment, extraction of teeth with alveoloplasty, pre-prosthetic surgery as indicated, periodontal therapy, dental restorations, root canal therapy, crown and bridge therapy, implant therapy, and replacement of missing teeth as indicated. The patient currently wishes to proceed with initial dental cleaning today of her remaining two lower teeth with denture adjustment of the upper complete denture as needed.  The patient does not wish to proceed with extraction of remaining teeth at this time. This is acceptable since these teeth are not in the primary field of radiation therapy. Patient will follow-up with [facility] for evaluation of extraction of remaining teeth and fabrication of new upper and lower complete dentures at least 3 months after the last radiation therapy has been provided.  A prescription for PreviDent 5000 has been sent to [place] at [facility] with refills for one year.  2. Discussion of findings with medical team and coordination of future medical and dental care as needed.  I spent in excess of  120 minutes during the conduct of this consultation and >50% of this time involved direct face-to-face encounter for counseling and/or coordination of the patient's care.    Lenn Cal, DDS

## 2018-11-22 NOTE — Progress Notes (Signed)
11/22/2018  Patient:            Gloria Murray Date of Birth:  09/01/1950 MRN:                [id number]  BP 106/84 (BP Location: Right Arm)   Pulse (!) 105   Temp 98.5 F (36.9 C)   Procedures: [id number] Gross debridement of remaining teeth numbers 26 and 27 with use of KaVo scaler and hand curettes. [place] Oral hygiene instruction provided. Patient was given a tooth brush and floss. Pressure indicating paste was then applied to the upper complete denture. Minimal adjustment was needed in the incisive papilla area where trauma occurred during the direct laryngoscopy procedure by patient report.This area of the denture was then polished.  Patient was instructed on keeping the denture out at bedtime. Patient was given to denture cups to achieve this processes. Patient is to clean her dentures with denture brush and soap and then soaked overnight and water. Patient also to consider use of Polident tablets to clean the dentures once a week.  [name]  [name]  Patient is aware of [name] Patient also is aware of her PET scan scheduled for tomorrow at [facility] [place], DDS

## 2018-11-23 ENCOUNTER — Encounter: Payer: Self-pay | Admitting: Radiation Oncology

## 2018-11-23 ENCOUNTER — Other Ambulatory Visit: Payer: Self-pay

## 2018-11-23 ENCOUNTER — Ambulatory Visit
Admission: RE | Admit: 2018-11-23 | Discharge: 2018-11-23 | Disposition: A | Discharge status: Home / Self Care | Payer: Medicare HMO | Source: Ambulatory Visit | Attending: Radiation Oncology | Admitting: Radiation Oncology

## 2018-11-23 ENCOUNTER — Encounter: Payer: Self-pay | Admitting: *Deleted

## 2018-11-23 ENCOUNTER — Ambulatory Visit (HOSPITAL_COMMUNITY)
Admission: RE | Admit: 2018-11-23 | Discharge: 2018-11-23 | Disposition: A | Discharge status: Home / Self Care | Payer: Medicare HMO | Source: Ambulatory Visit | Attending: Otolaryngology | Admitting: Otolaryngology

## 2018-11-23 DIAGNOSIS — Z79899 Other long term (current) drug therapy: Secondary | ICD-10-CM | POA: Diagnosis not present

## 2018-11-23 DIAGNOSIS — E279 Disorder of adrenal gland, unspecified: Secondary | ICD-10-CM | POA: Diagnosis not present

## 2018-11-23 DIAGNOSIS — C321 Malignant neoplasm of supraglottis: Secondary | ICD-10-CM | POA: Insufficient documentation

## 2018-11-23 DIAGNOSIS — C329 Malignant neoplasm of larynx, unspecified: Secondary | ICD-10-CM

## 2018-11-23 DIAGNOSIS — C760 Malignant neoplasm of head, face and neck: Secondary | ICD-10-CM | POA: Diagnosis not present

## 2018-11-23 DIAGNOSIS — C770 Secondary and unspecified malignant neoplasm of lymph nodes of head, face and neck: Secondary | ICD-10-CM | POA: Diagnosis not present

## 2018-11-23 DIAGNOSIS — Z87891 Personal history of nicotine dependence: Secondary | ICD-10-CM | POA: Diagnosis not present

## 2018-11-23 LAB — GLUCOSE, CAPILLARY: Glucose-Capillary: 125 mg/dL — ABNORMAL HIGH (ref 70–99)

## 2018-11-23 MED ORDER — FLUDEOXYGLUCOSE F - 18 (FDG) INJECTION
10.7000 | Freq: Once | INTRAVENOUS | Status: AC
  Administered 2018-11-23: 15:00:00 10.7 via INTRAVENOUS

## 2018-11-23 NOTE — Progress Notes (Signed)
Radiation Oncology         [phone] ________________________________  Initial Telephone Consultation  Name: Gloria Murray MRN: [id number]  Date: 11/23/2018  DOB: 1950-11-25  CC:Jearld Fenton, NP  Izora Gala, MD   REFERRING PHYSICIAN: Izora Gala, MD  DIAGNOSIS:    ICD-10-CM   1. Malignant neoplasm of supraglottis [facility]) C32.1    Cancer Staging Malignant neoplasm of supraglottis [facility]) Staging form: Larynx - Supraglottis, AJCC 8th Edition - Clinical stage from 11/27/2018: Stage IVB (cT2, cN3b, cM0) - Signed by Eppie Gibson, MD on 11/27/2018   CHIEF COMPLAINT: Here to discuss management of laryngeal cancer  HISTORY OF PRESENT ILLNESS::Gloria Murray is a 68 y.o. female who spoke with me via telephone today due to pandemic precautions.  She could not access WebEx. She presented with bilateral neck masses, hoarseness, and left-sided ear pain. She reports she first developed the neck masses in 08/2018 without any associated symptoms. These masses persisted for a few weeks and resolved without intervention. Unfortunately, they returned a few weeks later along with voice hoarseness and left-sided ear pain. She underwent neck CT scan on 10/26/2018, with results revealing: bulky, partially necrotic bilateral cervical lymphadenopathy (indicating likely ECE)  most concerning for metastatic disease; prominent soft tissue at the tongue base with asymmetric left aryepiglottic fold thickening and slight fullness of the left palatine tonsil.  Subsequently, the patient saw Dr. Constance Holster on 10/29/2018 who performed a laryngoscopy which revealed a supraglottic mass concerning for malignancy.  Biopsies of left supraglottis, left ary epiglottic fold, left vallecular base, and right vallecular epiglottis on 11/08/2018 revealed: invasive squamous cell carcinoma.  The patient met with Dr. Maylon Peppers on 11/21/2018 and with Dr. Enrique Sack on 11/22/2018.  Pertinent imaging thus far includes PET scan to be performed today,  11/23/2018 at 3 pm.   Swallowing issues, if any: she denies any issues  Weight Changes: she reports a 10-15 lb weight loss since her biopsy. Wt Readings from Last 3 Encounters:  11/21/18 215 lb 6.4 oz (97.7 kg)  11/08/18 230 lb (104.3 kg)  10/19/18 232 lb (105.2 kg)     Tobacco history, if any: former smoker, quit smoking around 2018-2019.  ETOH abuse, if any: none  S/p thyroidectomy, on supplements.  She doesn't vape or chew tobacco.   PREVIOUS RADIATION THERAPY: No  PAST MEDICAL HISTORY:  has a past medical history of Allergy, Carotid artery occlusion, Heart murmur, Hyperlipidemia, Hypertension, Hypothyroidism, Laryngeal mass, Osteoporosis, Thyroid disease, Wears glasses, and Wears upper complete and lower partial dentures.    PAST SURGICAL HISTORY: Past Surgical History:  Procedure Laterality Date  . ARCH AORTOGRAM N/A 07/23/2014   Procedure: ARCH AORTOGRAM;  Surgeon: Rosetta Posner, MD;  Location: [facility] CATH LAB;  Service: Cardiovascular;  Laterality: N/A;  . CAROTID ANGIOGRAM N/A 07/23/2014   Procedure: CAROTID ANGIOGRAM;  Surgeon: Rosetta Posner, MD;  Location: [facility] CATH LAB;  Service: Cardiovascular;  Laterality: N/A;  . CAROTID ENDARTERECTOMY  12/11/2008   left  . CAROTID STENT INSERTION Left 10/14/2014   Procedure: CAROTID STENT INSERTION;  Surgeon: Serafina Mitchell, MD;  Location: [facility] C[facility] LAB;  Service: Cardiovascular;  Laterality: Left;   carotid  . CARPAL TUNNEL RELEASE     X 2, right wrist  . COLONOSCOPY  Jan. 2, 2015  . DILATION AND CURETTAGE OF UTERUS     X 2  . laceration hand Right    hand  . LARYNGOSCOPY N/A 11/08/2018   Procedure: DIRECT MICROLARYNGOSCOPY WITH BIOPSY;  Surgeon: Redmond Baseman,  Orpah Greek, MD;  Location: [place];  Service: ENT;  Laterality: N/A;  . RIGID ESOPHAGOSCOPY N/A 11/08/2018   Procedure: ESOPHAGOSCOPY;  Surgeon: Melida Quitter, MD;  Location: [place];  Service: ENT;  Laterality: N/A;  . THYROIDECTOMY    . TUBAL LIGATION  1975   bilateral    FAMILY HISTORY:  family history includes Diabetes in her mother.  SOCIAL HISTORY:  reports that she quit smoking about 4 years ago. Her smoking use included cigarettes. She smoked 0.50 packs per day. She has never used smokeless tobacco. She reports that she does not drink alcohol or use drugs.  ALLERGIES: Propoxyphene hcl and Propoxyphene n-acetaminophen  MEDICATIONS:  Current Outpatient Medications  Medication Sig Dispense Refill  . Ascorbic Acid (VITAMIN C PO) Take 1 tablet by mouth daily.    Marland Kitchen aspirin EC 81 MG tablet Take 81 mg by mouth daily.    Marland Kitchen CALCIUM PO Take 1 tablet by mouth daily.    . Cholecalciferol (VITAMIN D3 PO) Take 1 tablet by mouth daily.    . clopidogrel (PLAVIX) 75 MG tablet TAKE 1 TABLET (75 MG TOTAL) BY MOUTH DAILY. 90 tablet 3  . fexofenadine (ALLEGRA) 180 MG tablet Take 180 mg by mouth at bedtime.     . fluticasone (FLONASE) 50 MCG/ACT nasal spray Place 1 spray into both nostrils daily.     . Garlic 3235 MG CAPS Take 1 capsule by mouth 2 (two) times daily.      . hydrochlorothiazide (MICROZIDE) 12.5 MG capsule Take 1 capsule (12.5 mg total) by mouth daily. 90 capsule 0  . levothyroxine (SYNTHROID, LEVOTHROID) 112 MCG tablet Take 1 tablet (112 mcg total) by mouth daily. 90 tablet 0  . metoprolol succinate (TOPROL-XL) 25 MG 24 hr tablet Take 1 tablet (25 mg total) by mouth daily. 90 tablet 0  . Omega-3 Fatty Acids (FISH OIL) 1000 MG CAPS Take 1 capsule (1,000 mg total) by mouth daily.    . simvastatin (ZOCOR) 40 MG tablet Take 1 tablet (40 mg total) by mouth at bedtime. (Patient taking differently: Take 40 mg by mouth daily. ) 90 tablet 0  . sodium fluoride (PREVIDENT 5000 PLUS) 1.1 % CREA dental cream Apply cream to tooth brush. Brush teeth for 2 minutes. Spit out excess. DO NOT rinse afterwards. Repeat nightly. 1 Tube prn  . VITAMIN E PO Take 1 capsule by mouth daily.     No current facility-administered medications for this encounter.     REVIEW OF SYSTEMS:  Notable for that  above.   PHYSICAL EXAM:  vitals were not taken for this visit.  hoarse, in NAD  LABORATORY DATA:  Lab Results  Component Value Date   WBC 6.2 11/21/2018   HGB 15.1 (H) 11/21/2018   HCT 45.4 11/21/2018   MCV 94.4 11/21/2018   PLT 340 11/21/2018   CMP     Component Value Date/Time   NA 143 11/21/2018 1251   K 3.9 11/21/2018 1251   CL 103 11/21/2018 1251   CO2 29 11/21/2018 1251   GLUCOSE 124 (H) 11/21/2018 1251   BUN 18 11/21/2018 1251   CREATININE 0.82 11/21/2018 1251   CREATININE 0.89 10/06/2017 1504   CALCIUM 9.9 11/21/2018 1251   PROT 7.2 11/21/2018 1251   ALBUMIN 4.1 11/21/2018 1251   AST 15 11/21/2018 1251   ALT 17 11/21/2018 1251   ALKPHOS 67 11/21/2018 1251   BILITOT 0.4 11/21/2018 1251   GFRNONAA >60 11/21/2018 1251   GFRAA >60 11/21/2018 1251  Lab Results  Component Value Date   TSH 2.94 10/06/2017     RADIOGRAPHY: Nm Pet Image Initial (pi) Skull Base To Thigh  Result Date: 11/23/2018 CLINICAL DATA:  Initial treatment strategy for head neck carcinoma. Original carcinoma. EXAM: NUCLEAR MEDICINE PET SKULL BASE TO THIGH TECHNIQUE: 10.7 mCi F-18 FDG was injected intravenously. Full-ring PET imaging was performed from the skull base to thigh after the radiotracer. CT data was obtained and used for attenuation correction and anatomic localization. Fasting blood glucose: 125 mg/dl COMPARISON:  Neck CT 10/26/2018 FINDINGS: Mediastinal blood pool activity: SUV max 3.7 NECK: Intensely hypermetabolic supraglottic tissue predominantly on the LEFT but extending across midline anteriorly. Activity is intense with SUV max equal 33. Metabolic activity inferiorly to potentially involve the larynx on the LEFT. Bilateral intensely hypermetabolic enlarged level 2 lymph nodes. Activity is intense and uniform SUV max equal 42 on the RIGHT and SUV max 38 on the LEFT. Smaller hypermetabolic lymph nodes extend inferiorly to the level 3 nodal station on LEFT and RIGHT. Hypermetabolic  lymph node LEFT level III with SUV max equal 15.6 measures 11 mm (image 51/4) adjacent to the carotid stent. No hypermetabolic supraclavicular nodes. No level I hypermetabolic lymph nodes. Incidental CT findings: none CHEST: No hypermetabolic mediastinal or hilar nodes. No suspicious pulmonary nodules on the CT scan. Incidental CT findings: none ABDOMEN/PELVIS: No abnormal hypermetabolic activity within the liver, pancreas, adrenal glands, or spleen. No hypermetabolic lymph nodes in the abdomen or pelvis. Incidental CT findings: Low-density enlargement of the LEFT adrenal gland without significant metabolic activity is favored benign. Lesion does not meet criteria for adenoma on noncontrast CT therefore favor lipid poor adenoma versus hyperplasia. SKELETON: No aggressive osseous lesion. Incidental CT findings: none IMPRESSION: 1. Intensely hypermetabolic supraglottic mass predominantly involving the LEFT aryepiglottic fold but extending across midline anteriorly. Inferiorly hypermetabolic activity extends to the level of the larynx on the LEFT. 2. Bilateral bulky intensely hypermetabolic metastatic level II lymph nodes. Metastatic adenopathy extends with smaller nodes in the level III nodal stations bilaterally. 3. No evidence of thoracic metastasis. 4. Enlarged LEFT adrenal gland consistent with hyperplasia or lipid poor adenoma. Electronically Signed   By: Suzy Bouchard M.D.   On: 11/23/2018 17:15      IMPRESSION/PLAN: Squamous cell carcinoma of the supraglottic larynx, at least Stage IVB Cancer Staging Malignant neoplasm of supraglottis [facility]) Staging form: Larynx - Supraglottis, AJCC 8th Edition - Clinical stage from 11/27/2018: Stage IVB (cT2, cN3b, cM0) - Signed by Eppie Gibson, MD on 11/27/2018   This is a delightful patient with head and neck cancer. I do recommend radiotherapy for this patient.  We discussed the potential risks, benefits, and side effects of radiotherapy. We talked in detail  about acute and late effects. We discussed that some of the most bothersome acute effects may be mucositis, dysgeusia, salivary changes, skin irritation, hair loss, dehydration, weight loss and fatigue. We talked about late effects which include but are not necessarily limited to dysphagia, dental issues, tissue injury, xerostomia, trismus, and neck edema. No guarantees of treatment were given.  The patient is enthusiastic about proceeding with treatment. I look forward to participating in the patient's care.    Simulation (treatment planning) will take place upon release from dentistry and PET completion  We also discussed that the treatment of head and neck cancer is a multidisciplinary process to maximize treatment outcomes and quality of life. For this reasons the following referrals have been or will be made:   Medical  oncology to discuss chemotherapy    Dentistry for dental evaluation, possible extractions in the radiation fields, and /or advice on reducing risk of cavities, osteoradionecrosis, or other oral issues.   Nutritionist for nutrition support during and after treatment.   Speech language pathology for swallowing and/or speech therapy.   Social work for social support.    This encounter was provided by telemedicine/telephone as she couldn't use WebEx at home. The time spent during this encounter was MORE than 30 minutes. The attendants for this meeting include Eppie Gibson  and [name]. Also, Gayleen Orem, RN, our Head and Neck Oncology Navigator. During the encounter, Eppie Gibson was located at [facility] Radiation Oncology Department.  [name] was located at home.  __________________________________________   Eppie Gibson, MD  This document serves as a record of services personally performed by Eppie Gibson, MD. It was created on her behalf by Wilburn Mylar, a trained medical scribe. The creation of this record is based on the scribe's personal  observations and the provider's statements to them. This document has been checked and approved by the attending provider.

## 2018-11-24 NOTE — Progress Notes (Addendum)
Oncology Nurse Navigator Documentation  Met with Ms. Harker during tele-Consult with Dr. Isidore Moos. She voiced understanding of:  Importance of this afternoon's PET in establishing treatment plan for her cancer  Previous discussion of PAC and PEG placement.  Referral to Nutrition, SLP, SW, her attendance at next Tuesday morning's tele-MDC.  CT SIM next week with RT start the first week in May. She confirmed she will need transportation assistance when treatments begin, voiced understanding I will coordinate with [facility] transportation scheduler. I encouraged her to call me with questions/concerns as she moves forward with appts/procedures.  Navigator Intervention IB sent to [employer].   Gayleen Orem, RN, BSN Head & Neck Oncology Nurse [place] at [place] [phone]

## 2018-11-26 ENCOUNTER — Telehealth: Payer: Self-pay | Admitting: Radiation Oncology

## 2018-11-26 ENCOUNTER — Other Ambulatory Visit: Payer: Self-pay | Admitting: *Deleted

## 2018-11-26 ENCOUNTER — Telehealth: Payer: Self-pay | Admitting: *Deleted

## 2018-11-26 DIAGNOSIS — C100 Malignant neoplasm of vallecula: Secondary | ICD-10-CM

## 2018-11-26 NOTE — Telephone Encounter (Signed)
Spoke with the patient regarding the transportation program and she stated that she would like to drive until she is told she shouldn't drive herself. I have her set up with the program and gave her my number to call when she needs help with transportation.

## 2018-11-26 NOTE — Telephone Encounter (Signed)
Oncology Nurse Navigator Documentation  Called Ms. Derringer to inform her of tomorrow's tele-MDC 10:45 SLP and 1:45 Nutrition calls.  LVMM with information, requested call back to confirm message receipt.  Gayleen Orem, RN, BSN Head & Neck Oncology Nurse [place] at [place] [phone] .

## 2018-11-27 ENCOUNTER — Encounter: Payer: Self-pay | Admitting: Radiation Oncology

## 2018-11-27 ENCOUNTER — Ambulatory Visit: Payer: Medicare HMO | Admitting: Nutrition

## 2018-11-27 ENCOUNTER — Ambulatory Visit: Payer: Medicare HMO | Attending: Radiation Oncology

## 2018-11-27 ENCOUNTER — Encounter: Payer: Self-pay | Admitting: *Deleted

## 2018-11-27 ENCOUNTER — Telehealth: Payer: Self-pay | Admitting: *Deleted

## 2018-11-27 DIAGNOSIS — R1310 Dysphagia, unspecified: Secondary | ICD-10-CM | POA: Insufficient documentation

## 2018-11-27 NOTE — Progress Notes (Signed)
[facility] OFFICE PROGRESS NOTE  Patient Care Team: Jearld Fenton, NP as PCP - General (Internal Medicine) Irene Shipper, MD as Consulting Physician (Gastroenterology) Early, Arvilla Meres, MD as Consulting Physician (Vascular Surgery) Serafina Mitchell, MD as Consulting Physician (Vascular Surgery) Eppie Gibson, MD as Attending Physician (Radiation Oncology) Leota Sauers, RN as Oncology Nurse Navigator (Oncology)  HEME/ONC OVERVIEW: 1. 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 R Level II LN 5 x 3.3cm; L Level II LN 5.4 x 2.7cm w/ suspicion for ECE) -10/2018: DL with bx by Dr. Redmond Baseman showed a mass involving the left aryepiglottic fold extending up to the epiglottic and BOT, bx showed invasive squamous cell carcinoma; PET showed FDG-avid supraglottic malignancy and bilateral bulky FDG-avid Level II and III cervical LN's, no mets  -Late 10/2018 - present: definitive chemoradiation with weekly carboplatin   2. Port and feeding tube TBD  TREATMENT REGIMEN:  12/10/2018 (tentative) - present: definitive chemoradiation with weekly carboplatin   ASSESSMENT & PLAN:   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 -I independently reviewed the radiologic images of PET, and agree with findings documented -PET showed the FDG avid supraglottic malignancy and bilateral bulky FDG-avid Level II and III cervical lymphadenopathy.  There was no evidence of distant metastasis. -I reviewed imaging results in detail with the patient, as well as the NCCN guidelines -Given the locally advanced disease, surgical resection would be associated with significant morbidities, and therefore definitive chemoradiation is a reasonable alternative. -In light of the patient's comorbidities, including carotid artery stenosis s/p endarterectomy, and  lack of social support, the toxicity profile of cisplatin may be very difficult for the patient to tolerate; therefore, I feel that weekly carboplatin is a reasonable substitute, recognizing that there is no head-to-head comparison between cisplatin and carboplatin -We discussed the role of chemotherapy. The intent is of curative intent. -We reviewed some of the risks, benefits, side-effects of carboplatin and its role as chemosensitizing agent. -The plan for weekly carboplatin AUC 2 for x 7 doses along with radiation treatment. -Some of the short term side-effects included, though not limited to, including weight loss, life threatening infections, risk of allergic reactions, need for transfusions of blood products, nausea, vomiting, change in bowel habits, loss of hair, admission to hospital for various reasons, and risks of death.  -Long term side-effects are also discussed including risks of infertility, permanent damage to nerve function, hearing loss, chronic fatigue, kidney damage with possibility needing hemodialysis, and rare secondary malignancy including bone marrow disorders. -The patient is aware that the response rates discussed earlier is not guaranteed.  After a long discussion, patient made an informed decision to proceed with the prescribed plan of care.  -In anticipation of chemotherapy, I have ordered port and feeding tube placement -I have also prescribed PRN anti-medics, including Zofran, Compazine, dexamethasone and Ativan -Radiation scheduled to start on 12/10/2018; we will tentatively schedule chemotherapy to start on 12/13/2018   Cancer-related pain -Secondary to laryngeal cancer -Pain minimal at this time  -Continue PRN Tylenol for now   Orders Placed This Encounter  Procedures  . IR IMAGING GUIDED PORT INSERTION    Standing Status:   Future    Standing Expiration Date:   01/28/2020    Order Specific Question:   Reason for Exam (SYMPTOM  OR DIAGNOSIS REQUIRED)    Answer:    H&N cancer needing  chemoradiation    Order Specific Question:   Preferred Imaging Location?    Answer:   [facility]  . CBC with Differential ([place] Only)    Standing Status:   Standing    Number of Occurrences:   20    Standing Expiration Date:   11/28/2019  . Basic Metabolic Panel - [place] Only    Standing Status:   Standing    Number of Occurrences:   20    Standing Expiration Date:   11/28/2019  . Magnesium    Standing Status:   Standing    Number of Occurrences:   20    Standing Expiration Date:   11/28/2019  . Ambulatory referral to General Surgery    Referral Priority:   Urgent    Referral Type:   Surgical    Referral Reason:   Specialty Services Required    Requested Specialty:   General Surgery    Number of Visits Requested:   1   All questions were answered. The patient knows to call the clinic with any problems, questions or concerns. No barriers to learning was detected.  A total of more than 40 minutes were spent face-to-face with the patient during this encounter and over half of that time was spent on counseling and coordination of care as outlined above.   Return on 12/12/2018 for port flush, labs, clinic appt and chemotherapy education. Chemo on 12/13/2018.   Tish Men, MD 11/28/2018 11:57 AM  CHIEF COMPLAINT: "I am doing alright "  INTERVAL HISTORY: [name] returns to clinic for follow-up of squamous cell carcinoma of the supraglottic larynx.  The patient reports that since last visit, she has been doing relatively well, and denies any fever, chill, night sweats, chest pain, dyspnea, cough, dysphagia, abdominal pain, nausea, vomiting, diarrhea.  She is eating and drinking relatively well.  He denies any new complaint today.  SUMMARY OF ONCOLOGIC HISTORY:   Malignant neoplasm of supraglottis ([place])   10/26/2018 Imaging    CT neck w/ contrast: IMPRESSION: 1. Bulky, partially necrotic bilateral cervical lymphadenopathy most concerning for  metastatic disease, less likely unusual infection. 2. Prominent soft tissue at the tongue base with asymmetric left aryepiglottic fold thickening and slight fullness of the left palatine tonsil. Direct visualization is recommended to assess for a primary mucosal malignancy. 3.  Aortic Atherosclerosis (ICD10-I70.0).    11/08/2018 Procedure    DL with biopsy    11/08/2018 Pathology Results    Accession: [id number] 7. Larynx, biopsy, Left Supraglottis - INVASIVE SQUAMOUS CELL CARCINOMA. 2. Larynx, biopsy, Left Ary Epiglottic Fold - INVASIVE SQUAMOUS CELL CARCINOMA. 3. Tongue, biopsy, Left Vallecular Base - INVASIVE SQUAMOUS CELL CARCINOMA. 4. Larynx, biopsy, Right Vallecular Epiglottis - INVASIVE SQUAMOUS CELL CARCINOMA.    11/16/2018 Initial Diagnosis    Laryngeal cancer ([place])    11/23/2018 Imaging    PET: IMPRESSION: 1. Intensely hypermetabolic supraglottic mass predominantly involving the LEFT aryepiglottic fold but extending across midline anteriorly. Inferiorly hypermetabolic activity extends to the level of the larynx on the LEFT. 2. Bilateral bulky intensely hypermetabolic metastatic level II lymph nodes. Metastatic adenopathy extends with smaller nodes in the level III nodal stations bilaterally. 3. No evidence of thoracic metastasis. 4. Enlarged LEFT adrenal gland consistent with hyperplasia or lipid poor adenoma.    11/27/2018 Cancer Staging    Staging form: Larynx - Supraglottis, AJCC 8th Edition - Clinical stage from 11/27/2018: Stage IVB (cT2, cN3b, cM0) - Signed by Eppie Gibson, MD on 11/27/2018  12/13/2018 -  Chemotherapy    The patient had palonosetron (ALOXI) injection 0.25 mg, 0.25 mg, Intravenous,  Once, 0 of 7 cycles CARBOplatin (PARAPLATIN) 210 mg in sodium chloride 0.9 % 100 mL chemo infusion, 210 mg (100 % of original dose 213.8 mg), Intravenous,  Once, 0 of 7 cycles Dose modification:   (original dose 213.8 mg, Cycle 1)  for chemotherapy treatment.       REVIEW OF SYSTEMS:   Constitutional: ( - ) fevers, ( - )  chills , ( - ) night sweats Eyes: ( - ) blurriness of vision, ( - ) double vision, ( - ) watery eyes Ears, nose, mouth, throat, and face: ( - ) mucositis, ( - ) sore throat Respiratory: ( - ) cough, ( - ) dyspnea, ( - ) wheezes Cardiovascular: ( - ) palpitation, ( - ) chest discomfort, ( - ) lower extremity swelling Gastrointestinal:  ( - ) nausea, ( - ) heartburn, ( - ) change in bowel habits Skin: ( - ) abnormal skin rashes Lymphatics: ( - ) new lymphadenopathy, ( - ) easy bruising Neurological: ( - ) numbness, ( - ) tingling, ( - ) new weaknesses Behavioral/Psych: ( - ) mood change, ( - ) new changes  All other systems were reviewed with the patient and are negative.  I have reviewed the past medical history, past surgical history, social history and family history with the patient and they are unchanged from previous note.  ALLERGIES:  is allergic to propoxyphene hcl and propoxyphene n-acetaminophen.  MEDICATIONS:  Current Outpatient Medications  Medication Sig Dispense Refill  . Ascorbic Acid (VITAMIN C PO) Take 1 tablet by mouth daily.    Marland Kitchen aspirin EC 81 MG tablet Take 81 mg by mouth daily.    Marland Kitchen CALCIUM PO Take 1 tablet by mouth daily.    . Cholecalciferol (VITAMIN D3 PO) Take 1 tablet by mouth daily.    . clopidogrel (PLAVIX) 75 MG tablet TAKE 1 TABLET (75 MG TOTAL) BY MOUTH DAILY. 90 tablet 3  . dexamethasone (DECADRON) 4 MG tablet Take 2 tablets (8 mg total) by mouth daily. Start the day after chemotherapy for 2 days. Take with food. 30 tablet 1  . fexofenadine (ALLEGRA) 180 MG tablet Take 180 mg by mouth at bedtime.     . fluticasone (FLONASE) 50 MCG/ACT nasal spray Place 1 spray into both nostrils daily.     . Garlic 0932 MG CAPS Take 1 capsule by mouth 2 (two) times daily.      . hydrochlorothiazide (MICROZIDE) 12.5 MG capsule Take 1 capsule (12.5 mg total) by mouth daily. 90 capsule 0  . levothyroxine  (SYNTHROID, LEVOTHROID) 112 MCG tablet Take 1 tablet (112 mcg total) by mouth daily. 90 tablet 0  . lidocaine-prilocaine (EMLA) cream Apply to affected area once 30 g 3  . LORazepam (ATIVAN) 0.5 MG tablet Take 1 tablet (0.5 mg total) by mouth every 6 (six) hours as needed (Nausea or vomiting). 30 tablet 0  . metoprolol succinate (TOPROL-XL) 25 MG 24 hr tablet Take 1 tablet (25 mg total) by mouth daily. 90 tablet 0  . Omega-3 Fatty Acids (FISH OIL) 1000 MG CAPS Take 1 capsule (1,000 mg total) by mouth daily.    . ondansetron (ZOFRAN) 8 MG tablet Take 1 tablet (8 mg total) by mouth 2 (two) times daily as needed for refractory nausea / vomiting. Start on day 3 after chemotherapy. 30 tablet 1  . prochlorperazine (COMPAZINE) 10 MG tablet Take  1 tablet (10 mg total) by mouth every 6 (six) hours as needed (Nausea or vomiting). 30 tablet 1  . simvastatin (ZOCOR) 40 MG tablet Take 1 tablet (40 mg total) by mouth at bedtime. (Patient taking differently: Take 40 mg by mouth daily. ) 90 tablet 0  . sodium fluoride (PREVIDENT 5000 PLUS) 1.1 % CREA dental cream Apply cream to tooth brush. Brush teeth for 2 minutes. Spit out excess. DO NOT rinse afterwards. Repeat nightly. 1 Tube prn  . VITAMIN E PO Take 1 capsule by mouth daily.     No current facility-administered medications for this visit.     PHYSICAL EXAMINATION: ECOG PERFORMANCE STATUS: 2 - Symptomatic, <50% confined to bed  Today's Vitals   11/28/18 0944 11/28/18 0956  BP: 122/89   Pulse: 98   Resp: 18   Temp: 98.3 F (36.8 C)   TempSrc: Oral   SpO2: 97%   Weight: 215 lb 4.8 oz (97.7 kg)   Height: 5\' 5"  (1.651 m)   PainSc:  0-No pain   Body mass index is 35.83 kg/m.  Filed Weights   11/28/18 0944  Weight: 215 lb 4.8 oz (97.7 kg)    GENERAL: alert, no distress, older than stated age  SKIN: skin color, texture, turgor are normal, no rashes or significant lesions EYES: conjunctiva are pink and non-injected, sclera clear OROPHARYNX: no  exudate, no erythema; lips, buccal mucosa, and tongue normal  NECK: supple, non-tender LYMPH:  bulky bilateral cervical adenopathy  LUNGS: clear to auscultation with normal breathing effort HEART: regular rate & rhythm and no murmurs and no lower extremity edema ABDOMEN: soft, non-tender, non-distended, normal bowel sounds Musculoskeletal: no cyanosis of digits and no clubbing  PSYCH: alert & oriented x 3, hoarse voice  NEURO: no focal motor/sensory deficits  LABORATORY DATA:  I have reviewed the data as listed    Component Value Date/Time   NA 143 11/21/2018 1251   K 3.9 11/21/2018 1251   CL 103 11/21/2018 1251   CO2 29 11/21/2018 1251   GLUCOSE 124 (H) 11/21/2018 1251   BUN 18 11/21/2018 1251   CREATININE 0.82 11/21/2018 1251   CREATININE 0.89 10/06/2017 1504   CALCIUM 9.9 11/21/2018 1251   PROT 7.2 11/21/2018 1251   ALBUMIN 4.1 11/21/2018 1251   AST 15 11/21/2018 1251   ALT 17 11/21/2018 1251   ALKPHOS 67 11/21/2018 1251   BILITOT 0.4 11/21/2018 1251   GFRNONAA >60 11/21/2018 1251   GFRAA >60 11/21/2018 1251    No results found for: SPEP, UPEP  Lab Results  Component Value Date   WBC 6.2 11/21/2018   NEUTROABS 4.6 11/21/2018   HGB 15.1 (H) 11/21/2018   HCT 45.4 11/21/2018   MCV 94.4 11/21/2018   PLT 340 11/21/2018      Chemistry      Component Value Date/Time   NA 143 11/21/2018 1251   K 3.9 11/21/2018 1251   CL 103 11/21/2018 1251   CO2 29 11/21/2018 1251   BUN 18 11/21/2018 1251   CREATININE 0.82 11/21/2018 1251   CREATININE 0.89 10/06/2017 1504      Component Value Date/Time   CALCIUM 9.9 11/21/2018 1251   ALKPHOS 67 11/21/2018 1251   AST 15 11/21/2018 1251   ALT 17 11/21/2018 1251   BILITOT 0.4 11/21/2018 1251       RADIOGRAPHIC STUDIES: I have personally reviewed the radiological images as listed below and agreed with the findings in the report. Nm Pet Image Initial (pi) Skull  Base To Thigh  Result Date: 11/23/2018 CLINICAL DATA:  Initial  treatment strategy for head neck carcinoma. Original carcinoma. EXAM: NUCLEAR MEDICINE PET SKULL BASE TO THIGH TECHNIQUE: 10.7 mCi F-18 FDG was injected intravenously. Full-ring PET imaging was performed from the skull base to thigh after the radiotracer. CT data was obtained and used for attenuation correction and anatomic localization. Fasting blood glucose: 125 mg/dl COMPARISON:  Neck CT 10/26/2018 FINDINGS: Mediastinal blood pool activity: SUV max 3.7 NECK: Intensely hypermetabolic supraglottic tissue predominantly on the LEFT but extending across midline anteriorly. Activity is intense with SUV max equal 33. Metabolic activity inferiorly to potentially involve the larynx on the LEFT. Bilateral intensely hypermetabolic enlarged level 2 lymph nodes. Activity is intense and uniform SUV max equal 42 on the RIGHT and SUV max 38 on the LEFT. Smaller hypermetabolic lymph nodes extend inferiorly to the level 3 nodal station on LEFT and RIGHT. Hypermetabolic lymph node LEFT level III with SUV max equal 15.6 measures 11 mm (image 51/4) adjacent to the carotid stent. No hypermetabolic supraclavicular nodes. No level I hypermetabolic lymph nodes. Incidental CT findings: none CHEST: No hypermetabolic mediastinal or hilar nodes. No suspicious pulmonary nodules on the CT scan. Incidental CT findings: none ABDOMEN/PELVIS: No abnormal hypermetabolic activity within the liver, pancreas, adrenal glands, or spleen. No hypermetabolic lymph nodes in the abdomen or pelvis. Incidental CT findings: Low-density enlargement of the LEFT adrenal gland without significant metabolic activity is favored benign. Lesion does not meet criteria for adenoma on noncontrast CT therefore favor lipid poor adenoma versus hyperplasia. SKELETON: No aggressive osseous lesion. Incidental CT findings: none IMPRESSION: 1. Intensely hypermetabolic supraglottic mass predominantly involving the LEFT aryepiglottic fold but extending across midline anteriorly.  Inferiorly hypermetabolic activity extends to the level of the larynx on the LEFT. 2. Bilateral bulky intensely hypermetabolic metastatic level II lymph nodes. Metastatic adenopathy extends with smaller nodes in the level III nodal stations bilaterally. 3. No evidence of thoracic metastasis. 4. Enlarged LEFT adrenal gland consistent with hyperplasia or lipid poor adenoma. Electronically Signed   By: Suzy Bouchard M.D.   On: 11/23/2018 17:15

## 2018-11-27 NOTE — Progress Notes (Signed)
RD working remotely.  68 year old female diagnosed with larynx cancer to  receive radiation therapy.  PMH includes HLD, HTN, Hypothyroidism, Osteoporosis, Upper and lower dentures.  Medications include Vitamin C, Calcium, Vitamin D3, Synthroid, Zocor and fish oil.  Labs include Glucose 124 on April 22.  Height: 66 inches. Weight: 215 pounds. UBW: 232 pounds March 20. BMI: 35.84  Patient reports she loves [place] food, hot wings and coffee. She eats almost all foods except liver and butter beans. She currently denies nutrition impact symptoms. Reports eating mostly soup and sandwiches currently. Did not tolerate carbonated beverages. She occasionally drinks a glass of water.  Nutrition Diagnosis: Unintended weight loss related to larynx cancer and associated treatments as evidenced by ~17 pound weight loss in one month.  Intervention: Educated patient to consume small, frequent meals and snacks. Recommended Ensure or Boost between meals. Will mail coupons. Reviewed soft foods. Encouraged increased fluid intake, especially water. Cautioned patient that spicy foods may not be tolerated during treatment. Questions were answered. Will mail fact sheets to patient and my contact information.  Monitoring, Evaluation, Goals: Patient will tolerate soft diet to minimize weight loss.  Next Visit: Weekly after treatment starts.

## 2018-11-27 NOTE — Progress Notes (Signed)
Has armband been applied?  Yes  Does patient have an allergy to IV contrast dye?: No   Has patient ever received premedication for IV contrast dye?:  N/A  Does patient take metformin?: No  If patient does take metformin when was the last dose: N/A  Date of lab work: 11/21/18 BUN: 18 CR: 0.82 EGFR: >60   IV site: Left AC  Has IV site been added to flowsheet?  Yes

## 2018-11-28 ENCOUNTER — Ambulatory Visit
Admission: RE | Admit: 2018-11-28 | Discharge: 2018-11-28 | Disposition: A | Discharge status: Home / Self Care | Payer: Medicare HMO | Source: Ambulatory Visit | Attending: Radiation Oncology | Admitting: Radiation Oncology

## 2018-11-28 ENCOUNTER — Other Ambulatory Visit: Payer: Self-pay

## 2018-11-28 ENCOUNTER — Encounter: Payer: Self-pay | Admitting: Hematology

## 2018-11-28 ENCOUNTER — Inpatient Hospital Stay (HOSPITAL_BASED_OUTPATIENT_CLINIC_OR_DEPARTMENT_OTHER): Payer: Medicare HMO | Admitting: Hematology

## 2018-11-28 ENCOUNTER — Telehealth: Payer: Self-pay | Admitting: *Deleted

## 2018-11-28 ENCOUNTER — Telehealth: Payer: Self-pay | Admitting: Hematology

## 2018-11-28 ENCOUNTER — Encounter: Payer: Self-pay | Admitting: Radiation Oncology

## 2018-11-28 VITALS — BP 122/89 | HR 98 | Temp 98.3°F | Resp 18 | Ht 65.0 in | Wt 215.3 lb

## 2018-11-28 VITALS — BP 97/70 | HR 105 | Temp 98.3°F | Wt 215.0 lb

## 2018-11-28 DIAGNOSIS — E890 Postprocedural hypothyroidism: Secondary | ICD-10-CM | POA: Diagnosis not present

## 2018-11-28 DIAGNOSIS — D751 Secondary polycythemia: Secondary | ICD-10-CM

## 2018-11-28 DIAGNOSIS — Z79890 Hormone replacement therapy (postmenopausal): Secondary | ICD-10-CM | POA: Insufficient documentation

## 2018-11-28 DIAGNOSIS — C321 Malignant neoplasm of supraglottis: Secondary | ICD-10-CM | POA: Diagnosis not present

## 2018-11-28 DIAGNOSIS — Z87891 Personal history of nicotine dependence: Secondary | ICD-10-CM | POA: Insufficient documentation

## 2018-11-28 DIAGNOSIS — Z79899 Other long term (current) drug therapy: Secondary | ICD-10-CM | POA: Insufficient documentation

## 2018-11-28 DIAGNOSIS — Z85828 Personal history of other malignant neoplasm of skin: Secondary | ICD-10-CM

## 2018-11-28 DIAGNOSIS — Z7902 Long term (current) use of antithrombotics/antiplatelets: Secondary | ICD-10-CM | POA: Diagnosis not present

## 2018-11-28 DIAGNOSIS — E039 Hypothyroidism, unspecified: Secondary | ICD-10-CM

## 2018-11-28 DIAGNOSIS — Z951 Presence of aortocoronary bypass graft: Secondary | ICD-10-CM | POA: Diagnosis not present

## 2018-11-28 DIAGNOSIS — G893 Neoplasm related pain (acute) (chronic): Secondary | ICD-10-CM | POA: Diagnosis not present

## 2018-11-28 DIAGNOSIS — M810 Age-related osteoporosis without current pathological fracture: Secondary | ICD-10-CM | POA: Diagnosis not present

## 2018-11-28 DIAGNOSIS — Z7982 Long term (current) use of aspirin: Secondary | ICD-10-CM

## 2018-11-28 DIAGNOSIS — E785 Hyperlipidemia, unspecified: Secondary | ICD-10-CM | POA: Diagnosis not present

## 2018-11-28 DIAGNOSIS — I10 Essential (primary) hypertension: Secondary | ICD-10-CM | POA: Diagnosis not present

## 2018-11-28 DIAGNOSIS — R011 Cardiac murmur, unspecified: Secondary | ICD-10-CM

## 2018-11-28 DIAGNOSIS — Z510 Encounter for antineoplastic radiation therapy: Secondary | ICD-10-CM | POA: Diagnosis not present

## 2018-11-28 DIAGNOSIS — I700 Atherosclerosis of aorta: Secondary | ICD-10-CM | POA: Diagnosis not present

## 2018-11-28 MED ORDER — LORAZEPAM 0.5 MG PO TABS: 0.5000 mg | ORAL_TABLET | Freq: Four times a day (QID) | ORAL | 0 refills | Status: AC | PRN

## 2018-11-28 MED ORDER — ONDANSETRON HCL 8 MG PO TABS: 8.0000 mg | ORAL_TABLET | Freq: Two times a day (BID) | ORAL | 1 refills | Status: DC | PRN

## 2018-11-28 MED ORDER — PROCHLORPERAZINE MALEATE 10 MG PO TABS: 10.0000 mg | ORAL_TABLET | Freq: Four times a day (QID) | ORAL | 1 refills | Status: DC | PRN

## 2018-11-28 MED ORDER — SODIUM CHLORIDE 0.9% FLUSH
10.0000 mL | Freq: Once | INTRAVENOUS | Status: AC
  Administered 2018-11-28: 10 mL via INTRAVENOUS

## 2018-11-28 MED ORDER — DEXAMETHASONE 4 MG PO TABS: 8.0000 mg | ORAL_TABLET | Freq: Every day | ORAL | 1 refills | Status: DC

## 2018-11-28 MED ORDER — LIDOCAINE-PRILOCAINE 2.5-2.5 % EX CREA: TOPICAL_CREAM | CUTANEOUS | 3 refills | Status: DC

## 2018-11-28 NOTE — Telephone Encounter (Signed)
Oncology Nurse Navigator Documentation  In follow-up to our earlier conversation this afternoon, [name] called to confirm her 7:00 arrival tomorrow morning for 7:15 IV Start/8:00  CT SIM.   She also noted productive [place] tele-visits with Nutrition and SLP. I informed her appts pending for port placement and referral to [facility] for PEG placement.  She voiced understanding. I encouraged her to call me with questions/concerns as she moves forward with appts/procedures.  She voiced agreement.  Gayleen Orem, RN, BSN Head & Neck Oncology Nurse [place] [facility] [place] [phone]

## 2018-11-28 NOTE — Progress Notes (Signed)
START ON PATHWAY REGIMEN - Head and Neck     A cycle is every 7 days:     Carboplatin   **Always confirm dose/schedule in your pharmacy ordering system**  Patient Characteristics: Larynx, Stage III, IVA, IVB; Unresectable Disease Classification: Larynx Current Disease Status: No Distant Metastases and No Recurrent Disease AJCC T Category: T2 AJCC 8 Stage Grouping: IVB AJCC N Category: cN3b AJCC M Category: M0 Intent of Therapy: Curative Intent, Discussed with Patient

## 2018-11-28 NOTE — Telephone Encounter (Signed)
Called and scheduled appt per 4/29 los,  Left a VM of scheduled appts.

## 2018-11-28 NOTE — Therapy (Addendum)
[facility] [address] [place], [place], [address] Phone: [phone]   Fax:  [phone]  Speech Language Pathology Evaluation  Patient Details  Name: Gloria Murray MRN: [id number] Date of Birth: [birth year] Referring Provider (SLP): Eppie Gibson MD   Encounter Date: 11/27/2018  End of Session - 11/28/18 1018    Visit Number  1    Number of Visits  3    Date for SLP Re-Evaluation  02/25/19    SLP Start Time  1100    SLP Stop Time   1145    SLP Time Calculation (min)  45 min    Activity Tolerance  Patient tolerated treatment well       Past Medical History:  Diagnosis Date  . Allergy   . Carotid artery occlusion   . Heart murmur   . Hyperlipidemia   . Hypertension   . Hypothyroidism   . Laryngeal mass   . Osteoporosis   . Thyroid disease   . Wears glasses   . Wears upper complete and lower partial dentures     Past Surgical History:  Procedure Laterality Date  . ARCH AORTOGRAM N/A 07/23/2014   Procedure: ARCH AORTOGRAM;  Surgeon: Rosetta Posner, MD;  Location: [facility] CATH LAB;  Service: Cardiovascular;  Laterality: N/A;  . CAROTID ANGIOGRAM N/A 07/23/2014   Procedure: CAROTID ANGIOGRAM;  Surgeon: Rosetta Posner, MD;  Location: [facility] CATH LAB;  Service: Cardiovascular;  Laterality: N/A;  . CAROTID ENDARTERECTOMY  12/11/2008   left  . CAROTID STENT INSERTION Left 10/14/2014   Procedure: CAROTID STENT INSERTION;  Surgeon: Serafina Mitchell, MD;  Location: [facility] CATH LAB;  Service: Cardiovascular;  Laterality: Left;   carotid  . CARPAL TUNNEL RELEASE     X 2, right wrist  . COLONOSCOPY  Jan. 2, 2015  . DILATION AND CURETTAGE OF UTERUS     X 2  . laceration hand Right    hand  . LARYNGOSCOPY N/A 11/08/2018   Procedure: DIRECT MICROLARYNGOSCOPY WITH BIOPSY;  Surgeon: Melida Quitter, MD;  Location: [facility];  Service: ENT;  Laterality: N/A;  . RIGID ESOPHAGOSCOPY N/A 11/08/2018   Procedure: ESOPHAGOSCOPY;  Surgeon: Melida Quitter, MD;  Location: [facility];  Service: ENT;  Laterality: N/A;  . THYROIDECTOMY    . TUBAL LIGATION  1975   bilateral    There were no vitals filed for this visit.  Subjective Assessment - 11/27/18 1717    Subjective  "I did some of these (swallow HEP) this morning."    Currently in Pain?  No/denies         SLP Evaluation [facility] - 11/27/18 1717      SLP Visit Information   SLP Received On  11/27/18    Referring Provider (SLP)  Eppie Gibson MD    Onset Date  March 2020    Medical Diagnosis  Supraglottic/tongue base ISCC      Subjective   Patient/Family Stated Goal  "I don't want (swallowing to get worse)."      General Information   HPI  Pt saw Dr. Constance Holster in March 2020 with bil neck masses. Laryngoscopy showed exophytic supraglottic laryngeal mass; CT showed thickening of lt aryepiglottic fold with bil bulky cervical adenopathy. PET November 23, 2018 confirmed localized disease including bil nodal involvement with no metastatic disease. Pt scheduled for chemoRT or RT only. Pt goes in for CT-sim 11-28-18.      Prior Functional Status   Cognitive/Linguistic Baseline  Within  functional limits      Oral Motor/Sensory Function   Overall Oral Motor/Sensory Function  Other (comment)   unable to assess - telephone visit     Motor Speech   Phonation  Hoarse   mod-severe hoarse/harsh quality     Pt reports currently tolerating what was described most closely to dys III diet with thin liquids. She tells SLP she is "staying away" from tougher meats. She denies overt s/s aspiration during meals but tells SLP she experiences coughing approx 30 minutes after meals. SLP inquired about a GERD diagnosis but pt denied she has this.  Because data states the risk for dysphagia during and after radiation treatment is high due to undergoing radiation tx, SLP taught pt about the possibility of reduced/limited ability for PO intake during rad tx. SLP encouraged pt to continue swallowing POs as far into  rad tx as possible, even ingesting POs and/or completing HEP shortly after administration of pain meds.   SLP educated pt re: changes to swallowing musculature after rad tx, and why adherence to dysphagia HEP provided today and PO consumption was necessary to reduce muscle fibrosis following rad tx. Pt demonstrated understanding of these things to SLP.    SLP then developed a HEP for pt and described each exercise for pt. SLP ensured, as best as possible over the telephone, pt performance was correct prior to moving on to next exercise. Pt was instructed to complete this program twice a day until 6 months after his last rad tx, then x2 a week after that.                 SLP Education - 11/28/18 1017    Education Details  HEP, late effects head/neck radiation  on swallow ability, use of prescribed pain meds for POs and HEP completion    Person(s) Educated  Patient    Methods  Explanation;Verbal cues    Comprehension  Verbalized understanding;Verbal cues required;Need further instruction       SLP Short Term Goals - 11/28/18 [place] #1   Title  pt will demonstrate understanding of how to accurately complete HEP    Time  1    Period  --   session   Status  New      SLP SHORT TERM GOAL #2   Title  pt will tell SLP 3 overt s/s aspiration PNA    Time  2    Period  --   visits   Status  New      SLP SHORT TERM GOAL #3   Title  pt will tell SLP how a food journal can assist return to safest pt diet    Time  2    Period  --   vistis   Status  New       SLP Long Term Goals - 11/28/18 1031      SLP LONG TERM GOAL #1   Title  pt will demo understanding how to complete HEP with accuracy over two visits    Time  3    Period  --   visits   Status  New      SLP LONG TERM GOAL #2   Title  pt will tell SLP when she can decr frequency of HEP to twice a week    Time  3    Period  --   visits   Status  New  Plan - 11/28/18 1019     Clinical Impression Statement  This visit was completed via telephone at the direction of medical director Dr. Eppie Gibson due to additional pt safety necessary from the COVID-19 pandemic. Pt has described oropharyngeal swallowing as functional/normal for dys III/regular diet. Pt expresses some difficulty in pharyngeal clearance with tougher meats.  There were overt s/s aspiration reported by pt, nor any overt s/s aspiration PNA reported by pt. The probability of swallowing difficulty increases dramatically with the initiation of chemo and radiation therapy. Pt will need to be followed by SLP for regular assessment of accurate HEP completion as well as for safety with POs both during and following treatment/s. Visits will be conducted via telephone or telehealth, remotely, until otherwise directed by medical director Dr. Eppie Gibson.    Speech Therapy Frequency  --   once every approx 4 weeks   Duration  --   3 visits   Treatment/Interventions  Aspiration precaution training;Pharyngeal strengthening exercises;Diet toleration management by SLP;Compensatory techniques;SLP instruction and feedback;Patient/family education;Compensatory strategies;Trials of upgraded texture/liquids    Potential to Achieve Goals  Good    SLP Home Exercise Plan  described to pt today    Consulted and Agree with Plan of Care  Patient       Patient will benefit from skilled therapeutic intervention in order to improve the following deficits and impairments:   Dysphagia, unspecified type - Plan: SLP plan of care cert/re-cert    Problem List Patient Active Problem List   Diagnosis Date Noted  . Malignant neoplasm of supraglottis ([place]) 11/16/2018  . Osteoporosis 07/07/2017  . Seasonal allergies 07/07/2017  . Carotid stenosis 10/14/2014  . Hypothyroidism 01/04/2007  . HYPERLIPIDEMIA, MIXED, MILD 01/04/2007  . HYPERTENSION, BENIGN ESSENTIAL 01/04/2007    [facility] ,MS, CCC-SLP  11/28/2018, 10:33 AM  [facility] [address] [place], [place], [address] Phone: [phone]   Fax:  [phone]  Name: Gloria Murray MRN: [id number] Date of Birth: [birth year]

## 2018-11-28 NOTE — Patient Instructions (Signed)
SWALLOWING EXERCISES Do these until 6 months after your last day of radiation, then 2 times per week afterwards  1. Effortful Swallows - Press your tongue against the roof of your mouth for 3 seconds, then squeeze          the muscles in your neck while you swallow your saliva or a sip of water - Repeat 15 times, 2-3 times a day, and use whenever you eat or drink  2. Masako Swallow - swallow with your tongue sticking out - Stick tongue out past your teeth and gently bite tongue with your teeth - Swallow, while holding your tongue with your teeth - Repeat 15 times, 2-3 times a day *use a wet spoon if your mouth gets dry*  3. Pitch Raise - Repeat "he", once per second in as high of a pitch as you can - Repeat 20 times, 2-3 times a day  4. Shaker Exercise - head lift - Lie flat on your back in your bed or on a couch without pillows - Raise your head and look at your feet - KEEP YOUR SHOULDERS DOWN - HOLD FOR 45-60 SECONDS, then lower your head back down - Repeat 3 times, 2-3 times a day  5. Mendelsohn Maneuver - "half swallow" exercise - Start to swallow, and keep your Adam's apple up by squeezing hard with the            muscles of the throat - Hold the squeeze for 5-7 seconds and then relax - Repeat 10-15 times, 2-3 times a day *use a wet spoon if your mouth gets dry*  6. Chin pushback - Open your mouth  - Place your fist UNDER your chin near your neck, and push back with your fist for 5 seconds - Repeat 10 times, 2-3 times a day

## 2018-11-28 NOTE — Telephone Encounter (Signed)
Oncology Nurse Navigator Documentation  In follow-up to surgical referral placed this morning by Dr. Maylon Peppers, spoke with Gloria Murray [facility], to coordinate consult appt for assessment of PEG placement.  She indicated consultation scheduled 5/6 2:45 with Dr. Dalbert Batman pending her confirmation with patient.  Dr. Maylon Peppers informed.  Gloria Orem, RN, BSN Head & Neck Oncology Nurse [place] at [place] [phone]

## 2018-11-29 ENCOUNTER — Telehealth: Payer: Self-pay | Admitting: *Deleted

## 2018-11-29 ENCOUNTER — Telehealth (HOSPITAL_COMMUNITY): Payer: Self-pay

## 2018-11-29 NOTE — Telephone Encounter (Signed)
Pt returned call regarding port placement. She stated that she was getting her port and gtube placed by [facility]. I called Rick (nurse navigator) to confirm this. Pt is aware that I will call her back if I need to schedule. AW

## 2018-11-29 NOTE — Telephone Encounter (Signed)
Called to schedule port placement, no answer, left vm. AW  

## 2018-11-29 NOTE — Telephone Encounter (Signed)
Oncology Nurse Navigator Documentation  Following calls to [facility], [facility], [facility] and Ms. Trettel, called her with the following clarification of appt schedule for next week:  12/05/2018:  2:20 arrival to [facility] for appt with Dr. Dalbert Batman.  I explained this appt is consultation for G-tube placement to be scheduled at later date and does not include port placement.  12/06/2018:  1:00 arrival to [facility] for port placement.  Instructed her per my discussion with [facility] to hold Plavix beginning Saturday, NPO beginning 8:00 am Thursday morning, meds with water sips only.  Needs someone to drive her home when DC'd at approximately 4:30.    She will be contacted by [facility] Gin[facility] Otto to schedule LYFT ride to [facility] for port appt.  Ms. Beckstrand confirmed friend available to take her home upon DC. Ms. Farnworth confirmed understanding of above, agreed to call me with questions/concerns.  Gayleen Orem, RN, BSN Head & Neck Oncology Nurse [place] at [place] [phone]

## 2018-11-30 ENCOUNTER — Other Ambulatory Visit: Payer: Self-pay | Admitting: Hematology

## 2018-11-30 ENCOUNTER — Telehealth: Payer: Self-pay | Admitting: *Deleted

## 2018-11-30 ENCOUNTER — Telehealth: Payer: Self-pay | Admitting: Radiation Oncology

## 2018-11-30 DIAGNOSIS — C321 Malignant neoplasm of supraglottis: Secondary | ICD-10-CM

## 2018-11-30 NOTE — Telephone Encounter (Signed)
Spoke with patient and verified transportation for appointments at the [facility]. Patient expressed understanding.

## 2018-11-30 NOTE — Telephone Encounter (Signed)
Oncology Nurse Navigator Documentation  LVMM reminder to hold Plavix starting tomorrow in preparation for next Thursday's port placement.  Requested call-back to confirm understanding.  Gayleen Orem, RN, BSN Head & Neck Oncology Nurse [place] at [place] [phone]

## 2018-12-04 DIAGNOSIS — Z87891 Personal history of nicotine dependence: Secondary | ICD-10-CM | POA: Diagnosis not present

## 2018-12-04 DIAGNOSIS — Z79899 Other long term (current) drug therapy: Secondary | ICD-10-CM | POA: Diagnosis not present

## 2018-12-04 DIAGNOSIS — I10 Essential (primary) hypertension: Secondary | ICD-10-CM | POA: Insufficient documentation

## 2018-12-04 DIAGNOSIS — Z7902 Long term (current) use of antithrombotics/antiplatelets: Secondary | ICD-10-CM | POA: Insufficient documentation

## 2018-12-04 DIAGNOSIS — Z951 Presence of aortocoronary bypass graft: Secondary | ICD-10-CM | POA: Insufficient documentation

## 2018-12-04 DIAGNOSIS — E890 Postprocedural hypothyroidism: Secondary | ICD-10-CM | POA: Diagnosis not present

## 2018-12-04 DIAGNOSIS — Z510 Encounter for antineoplastic radiation therapy: Secondary | ICD-10-CM | POA: Diagnosis not present

## 2018-12-04 DIAGNOSIS — E785 Hyperlipidemia, unspecified: Secondary | ICD-10-CM | POA: Diagnosis not present

## 2018-12-04 DIAGNOSIS — C321 Malignant neoplasm of supraglottis: Secondary | ICD-10-CM | POA: Insufficient documentation

## 2018-12-04 DIAGNOSIS — Z79890 Hormone replacement therapy (postmenopausal): Secondary | ICD-10-CM | POA: Diagnosis not present

## 2018-12-05 ENCOUNTER — Other Ambulatory Visit: Payer: Self-pay | Admitting: General Surgery

## 2018-12-05 ENCOUNTER — Ambulatory Visit: Payer: Self-pay | Admitting: Hematology

## 2018-12-05 ENCOUNTER — Ambulatory Visit (HOSPITAL_COMMUNITY): Payer: Medicare HMO

## 2018-12-05 ENCOUNTER — Other Ambulatory Visit: Payer: Self-pay | Admitting: Radiology

## 2018-12-05 DIAGNOSIS — I10 Essential (primary) hypertension: Secondary | ICD-10-CM | POA: Diagnosis not present

## 2018-12-05 DIAGNOSIS — C329 Malignant neoplasm of larynx, unspecified: Secondary | ICD-10-CM | POA: Diagnosis not present

## 2018-12-05 DIAGNOSIS — Z87898 Personal history of other specified conditions: Secondary | ICD-10-CM | POA: Diagnosis not present

## 2018-12-05 DIAGNOSIS — Z9889 Other specified postprocedural states: Secondary | ICD-10-CM | POA: Diagnosis not present

## 2018-12-05 DIAGNOSIS — Z9009 Acquired absence of other part of head and neck: Secondary | ICD-10-CM | POA: Diagnosis not present

## 2018-12-05 DIAGNOSIS — Z7901 Long term (current) use of anticoagulants: Secondary | ICD-10-CM | POA: Diagnosis not present

## 2018-12-05 DIAGNOSIS — Z87891 Personal history of nicotine dependence: Secondary | ICD-10-CM | POA: Diagnosis not present

## 2018-12-06 ENCOUNTER — Ambulatory Visit (HOSPITAL_COMMUNITY)
Admission: RE | Admit: 2018-12-06 | Discharge: 2018-12-06 | Disposition: A | Discharge status: Home / Self Care | Payer: Medicare HMO | Source: Ambulatory Visit | Attending: Hematology | Admitting: Hematology

## 2018-12-06 ENCOUNTER — Telehealth: Payer: Self-pay | Admitting: *Deleted

## 2018-12-06 ENCOUNTER — Telehealth: Payer: Self-pay

## 2018-12-06 ENCOUNTER — Other Ambulatory Visit: Payer: Self-pay | Admitting: General Surgery

## 2018-12-06 ENCOUNTER — Other Ambulatory Visit: Payer: Self-pay

## 2018-12-06 ENCOUNTER — Encounter (HOSPITAL_COMMUNITY): Payer: Self-pay

## 2018-12-06 DIAGNOSIS — E785 Hyperlipidemia, unspecified: Secondary | ICD-10-CM | POA: Insufficient documentation

## 2018-12-06 DIAGNOSIS — Z79899 Other long term (current) drug therapy: Secondary | ICD-10-CM | POA: Insufficient documentation

## 2018-12-06 DIAGNOSIS — Z888 Allergy status to other drugs, medicaments and biological substances status: Secondary | ICD-10-CM | POA: Insufficient documentation

## 2018-12-06 DIAGNOSIS — Z87891 Personal history of nicotine dependence: Secondary | ICD-10-CM | POA: Insufficient documentation

## 2018-12-06 DIAGNOSIS — M1990 Unspecified osteoarthritis, unspecified site: Secondary | ICD-10-CM | POA: Insufficient documentation

## 2018-12-06 DIAGNOSIS — Z95828 Presence of other vascular implants and grafts: Secondary | ICD-10-CM | POA: Diagnosis not present

## 2018-12-06 DIAGNOSIS — C329 Malignant neoplasm of larynx, unspecified: Secondary | ICD-10-CM | POA: Diagnosis not present

## 2018-12-06 DIAGNOSIS — C321 Malignant neoplasm of supraglottis: Secondary | ICD-10-CM

## 2018-12-06 DIAGNOSIS — I6529 Occlusion and stenosis of unspecified carotid artery: Secondary | ICD-10-CM | POA: Insufficient documentation

## 2018-12-06 DIAGNOSIS — Z9851 Tubal ligation status: Secondary | ICD-10-CM | POA: Diagnosis not present

## 2018-12-06 DIAGNOSIS — Z7982 Long term (current) use of aspirin: Secondary | ICD-10-CM | POA: Insufficient documentation

## 2018-12-06 DIAGNOSIS — Z452 Encounter for adjustment and management of vascular access device: Secondary | ICD-10-CM | POA: Diagnosis not present

## 2018-12-06 DIAGNOSIS — Z7902 Long term (current) use of antithrombotics/antiplatelets: Secondary | ICD-10-CM | POA: Diagnosis not present

## 2018-12-06 DIAGNOSIS — I10 Essential (primary) hypertension: Secondary | ICD-10-CM | POA: Diagnosis not present

## 2018-12-06 DIAGNOSIS — E039 Hypothyroidism, unspecified: Secondary | ICD-10-CM | POA: Diagnosis not present

## 2018-12-06 DIAGNOSIS — Z79890 Hormone replacement therapy (postmenopausal): Secondary | ICD-10-CM | POA: Insufficient documentation

## 2018-12-06 HISTORY — PX: IR IMAGING GUIDED PORT INSERTION: IMG5740

## 2018-12-06 LAB — CBC WITH DIFFERENTIAL/PLATELET
Abs Immature Granulocytes: 0.02 10*3/uL (ref 0.00–0.07)
Basophils Absolute: 0 10*3/uL (ref 0.0–0.1)
Basophils Relative: 1 %
Eosinophils Absolute: 0.1 10*3/uL (ref 0.0–0.5)
Eosinophils Relative: 1 %
HCT: 45.5 % (ref 36.0–46.0)
Hemoglobin: 15.1 g/dL — ABNORMAL HIGH (ref 12.0–15.0)
Immature Granulocytes: 0 %
Lymphocytes Relative: 18 %
Lymphs Abs: 1.1 10*3/uL (ref 0.7–4.0)
MCH: 31.5 pg (ref 26.0–34.0)
MCHC: 33.2 g/dL (ref 30.0–36.0)
MCV: 94.8 fL (ref 80.0–100.0)
Monocytes Absolute: 0.4 10*3/uL (ref 0.1–1.0)
Monocytes Relative: 7 %
Neutro Abs: 4.4 10*3/uL (ref 1.7–7.7)
Neutrophils Relative %: 73 %
Platelets: 362 10*3/uL (ref 150–400)
RBC: 4.8 MIL/uL (ref 3.87–5.11)
RDW: 12.7 % (ref 11.5–15.5)
WBC: 6 10*3/uL (ref 4.0–10.5)
nRBC: 0 % (ref 0.0–0.2)

## 2018-12-06 LAB — PROTIME-INR
INR: 1 (ref 0.8–1.2)
Prothrombin Time: 13 seconds (ref 11.4–15.2)

## 2018-12-06 MED ORDER — HEPARIN SOD (PORK) LOCK FLUSH 100 UNIT/ML IV SOLN
INTRAVENOUS | Status: AC | PRN
  Administered 2018-12-06: 500 [IU] via INTRAVENOUS

## 2018-12-06 MED ORDER — CEFAZOLIN SODIUM-DEXTROSE 2-4 GM/100ML-% IV SOLN
INTRAVENOUS | Status: AC
  Administered 2018-12-06: 15:00:00 2 g via INTRAVENOUS
  Filled 2018-12-06: qty 100

## 2018-12-06 MED ORDER — FENTANYL CITRATE (PF) 100 MCG/2ML IJ SOLN
INTRAMUSCULAR | Status: AC | PRN
  Administered 2018-12-06: 50 ug via INTRAVENOUS

## 2018-12-06 MED ORDER — SODIUM CHLORIDE 0.9 % IV SOLN
INTRAVENOUS | Status: DC
  Administered 2018-12-06: 14:00:00 via INTRAVENOUS

## 2018-12-06 MED ORDER — MIDAZOLAM HCL 2 MG/2ML IJ SOLN
INTRAMUSCULAR | Status: AC | PRN
  Administered 2018-12-06 (×3): 1 mg via INTRAVENOUS

## 2018-12-06 MED ORDER — MIDAZOLAM HCL 2 MG/2ML IJ SOLN
INTRAMUSCULAR | Status: AC
  Filled 2018-12-06: qty 4

## 2018-12-06 MED ORDER — HEPARIN SOD (PORK) LOCK FLUSH 100 UNIT/ML IV SOLN
INTRAVENOUS | Status: AC
  Filled 2018-12-06: qty 5

## 2018-12-06 MED ORDER — LIDOCAINE-EPINEPHRINE (PF) 1 %-1:200000 IJ SOLN
INTRAMUSCULAR | Status: AC | PRN
  Administered 2018-12-06: 10 mL

## 2018-12-06 MED ORDER — FENTANYL CITRATE (PF) 100 MCG/2ML IJ SOLN
INTRAMUSCULAR | Status: AC
  Filled 2018-12-06: qty 2

## 2018-12-06 MED ORDER — LIDOCAINE-EPINEPHRINE 1 %-1:100000 IJ SOLN
INTRAMUSCULAR | Status: AC
  Filled 2018-12-06: qty 1

## 2018-12-06 MED ORDER — CEFAZOLIN SODIUM-DEXTROSE 2-4 GM/100ML-% IV SOLN
2.0000 g | INTRAVENOUS | Status: AC
  Administered 2018-12-06: 15:00:00 2 g via INTRAVENOUS

## 2018-12-06 NOTE — Telephone Encounter (Signed)
Oncology Nurse Navigator Documentation  Returned Gloria Murray's VMM left last evening.  She reported she met with Dr. Dalbert Batman ([place]) yesterday, his nurse will be calling her and me with date for PEG placement.  I confirmed her understanding of today's 1:00 arrival to [facility] for port placement.  I confirmed her understanding LYFT ride has been scheduled to pick her so she will arrive to [facility] well in advance of appt time.  She stated "They're picking me up at 1:00."  I assured her ride would arrive much sooner, that I would have [place] call her to confirm correct time.  Gloria Orem, RN, BSN Head & Neck Oncology Nurse [place] at [place] [phone]

## 2018-12-06 NOTE — Telephone Encounter (Signed)
[facility] Night - Client Nonclinical Telephone Record [facility] Night - Client Client Site [place] Physician Webb Silversmith - NP Contact Type Call Who Is Calling Physician / Provider / Hospital Call Type Provider Call Message Only Reason for Call Request to send message to Office Initial Comment Caller states she is needing a medical clearance for orders. Jacky calling from [place] surgery [phone] Patient- Gloria Murray DOB [birth year] Additional Comment Call Closed By: Memory Argue Transaction Date/Time: 12/05/2018 5:05:07 PM (ET)

## 2018-12-06 NOTE — Discharge Instructions (Signed)
Moderate Conscious Sedation, Adult, Care After These instructions provide you with information about caring for yourself after your procedure. Your health care provider may also give you more specific instructions. Your treatment has been planned according to current medical practices, but problems sometimes occur. Call your health care provider if you have any problems or questions after your procedure. What can I expect after the procedure? After your procedure, it is common:  To feel sleepy for several hours.  To feel clumsy and have poor balance for several hours.  To have poor judgment for several hours.  To vomit if you eat too soon. Follow these instructions at home: For at least 24 hours after the procedure:   Do not: ? Participate in activities where you could fall or become injured. ? Drive. ? Use heavy machinery. ? Drink alcohol. ? Take sleeping pills or medicines that cause drowsiness. ? Make important decisions or sign legal documents. ? Take care of children on your own.  Rest. Eating and drinking  Follow the diet recommended by your health care provider.  If you vomit: ? Drink water, juice, or soup when you can drink without vomiting. ? Make sure you have little or no nausea before eating solid foods. General instructions  Have a responsible adult stay with you until you are awake and alert.  Take over-the-counter and prescription medicines only as told by your health care provider.  If you smoke, do not smoke without supervision.  Keep all follow-up visits as told by your health care provider. This is important. Contact a health care provider if:  You keep feeling nauseous or you keep vomiting.  You feel light-headed.  You develop a rash.  You have a fever. Get help right away if:  You have trouble breathing. This information is not intended to replace advice given to you by your health care provider. Make sure you discuss any questions you have  with your health care provider. Document Released: 05/08/2013 Document Revised: 12/21/2015 Document Reviewed: 11/07/2015 Elsevier Interactive Patient Education  2019 [place] Insertion, Care After This sheet gives you information about how to care for yourself after your procedure. Your health care provider may also give you more specific instructions. If you have problems or questions, contact your health care provider. What can I expect after the procedure? After the procedure, it is common to have:  Discomfort at the port insertion site.  Bruising on the skin over the port. This should improve over 3-4 days. Follow these instructions at home: [facility] care  After your port is placed, you will get a manufacturer's information card. The card has information about your port. Keep this card with you at all times.  Take care of the port as told by your health care provider. Ask your health care provider if you or a family member can get training for taking care of the port at home. A home health care nurse may also take care of the port.  Make sure to remember what type of port you have. Incision care      Follow instructions from your health care provider about how to take care of your port insertion site. Make sure you: ? Wash your hands with soap and water before and after you change your bandage (dressing). If soap and water are not available, use hand sanitizer. ? Change your dressing as told by your health care provider.  You may remove your dressing and shower tomorrow. ? Leave stitches (  sutures), skin glue, or adhesive strips in place. These skin closures may need to stay in place for 2 weeks or longer. If adhesive strip edges start to loosen and curl up, you may trim the loose edges. Do not remove adhesive strips completely unless your health care provider tells you to do that.  DO NOT use EMLA cream for 2 weeks after port placement as this cream as this cream  will remove surgical glue on your incision.  Check your port insertion site every day for signs of infection. Check for: ? Redness, swelling, or pain. ? Fluid or blood. ? Warmth. ? Pus or a bad smell. Activity  Return to your normal activities as told by your health care provider. Ask your health care provider what activities are safe for you.  Do not lift anything that is heavier than 10 lb (4.5 kg), or the limit that you are told, until your health care provider says that it is safe. General instructions  Take over-the-counter and prescription medicines only as told by your health care provider.  Do not take baths, swim, or use a hot tub until your health care provider approves. Ask your health care provider if you may take showers. You may only be allowed to take sponge baths.  Do not drive for 24 hours if you were given a sedative during your procedure.  Wear a medical alert bracelet in case of an emergency. This will tell any health care providers that you have a port.  Keep all follow-up visits as told by your health care provider. This is important. Contact a health care provider if:  You cannot flush your port with saline as directed, or you cannot draw blood from the port.  You have a fever or chills.  You have redness, swelling, or pain around your port insertion site.  You have fluid or blood coming from your port insertion site.  Your port insertion site feels warm to the touch.  You have pus or a bad smell coming from the port insertion site. Get help right away if:  You have chest pain or shortness of breath.  You have bleeding from your port that you cannot control. Summary  Take care of the port as told by your health care provider. Keep the manufacturer's information card with you at all times.  Change your dressing as told by your health care provider.  Contact a health care provider if you have a fever or chills or if you have redness, swelling, or  pain around your port insertion site.  Keep all follow-up visits as told by your health care provider. This information is not intended to replace advice given to you by your health care provider. Make sure you discuss any questions you have with your health care provider. Document Released: 05/08/2013 Document Revised: 02/13/2018 Document Reviewed: 02/13/2018 Elsevier Interactive Patient Education  [employer].

## 2018-12-06 NOTE — H&P (Signed)
Chief Complaint: Patient was seen in consultation today for supraglottic cancer  Referring Physician(s): [place]  Supervising Physician: Jacqulynn Cadet  Patient Status: [facility] - Out-pt  History of Present Illness: Gloria Murray is a 68 y.o. female with past medical history of HTN, carotid artery occlusion s/p carotid endarterectomy with stent placement who was recently found to have a laryngeal mass.  Patient diagnosed with supraglottic invasive squamous cell carcinoma. She has plans for upcoming chemotherapy and is in need of durable venous access. She presents to [facility] today for Port-A-Cath placement.   Patient is on Plavix s/p stent placement 3 years ago.  She has appropriately held this.  She is NPO.  Denies fever, chills, cough, shortness of breath, nausea, vomiting, abdominal pain.   Past Medical History:  Diagnosis Date   Allergy    Carotid artery occlusion    Heart murmur    Hyperlipidemia    Hypertension    Hypothyroidism    Laryngeal mass    Osteoporosis    Thyroid disease    Wears glasses    Wears upper complete and lower partial dentures     Past Surgical History:  Procedure Laterality Date   ARCH AORTOGRAM N/A 07/23/2014   Procedure: ARCH AORTOGRAM;  Surgeon: Rosetta Posner, MD;  Location: [facility] CATH LAB;  Service: Cardiovascular;  Laterality: N/A;   CAROTID ANGIOGRAM N/A 07/23/2014   Procedure: CAROTID ANGIOGRAM;  Surgeon: Rosetta Posner, MD;  Location: [facility] CATH LAB;  Service: Cardiovascular;  Laterality: N/A;   CAROTID ENDARTERECTOMY  12/11/2008   left   CAROTID STENT INSERTION Left 10/14/2014   Procedure: CAROTID STENT INSERTION;  Surgeon: Serafina Mitchell, MD;  Location: [facility] CATH LAB;  Service: Cardiovascular;  Laterality: Left;   carotid   CARPAL TUNNEL RELEASE     X 2, right wrist   COLONOSCOPY  Jan. 2, 2015   DILATION AND CURETTAGE OF UTERUS     X 2   laceration hand Right    hand   LARYNGOSCOPY N/A 11/08/2018   Procedure:  DIRECT MICROLARYNGOSCOPY WITH BIOPSY;  Surgeon: Melida Quitter, MD;  Location: [place];  Service: ENT;  Laterality: N/A;   RIGID ESOPHAGOSCOPY N/A 11/08/2018   Procedure: ESOPHAGOSCOPY;  Surgeon: Melida Quitter, MD;  Location: [place];  Service: ENT;  Laterality: N/A;   THYROIDECTOMY     TUBAL LIGATION  1975   bilateral    Allergies: Propoxyphene hcl and Propoxyphene n-acetaminophen  Medications: Prior to Admission medications   Medication Sig Start Date End Date Taking? Authorizing Provider  Ascorbic Acid (VITAMIN C PO) Take 1 tablet by mouth daily.   Yes [provider]  aspirin EC 81 MG tablet Take 81 mg by mouth daily.   Yes [provider]  CALCIUM PO Take 1 tablet by mouth daily.   Yes [provider]  Cholecalciferol (VITAMIN D3 PO) Take 1 tablet by mouth daily.   Yes [provider]  fexofenadine (ALLEGRA) 180 MG tablet Take 180 mg by mouth at bedtime.    Yes [provider]  fluticasone (FLONASE) 50 MCG/ACT nasal spray Place 1 spray into both nostrils daily.    Yes [provider]  Garlic 4132 MG CAPS Take 1 capsule by mouth 2 (two) times daily.     Yes [provider]  hydrochlorothiazide (MICROZIDE) 12.5 MG capsule Take 1 capsule (12.5 mg total) by mouth daily. 10/19/18  Yes Baity, Coralie Keens, NP  levothyroxine (SYNTHROID, LEVOTHROID) 112 MCG tablet Take 1 tablet (  112 mcg total) by mouth daily. 10/19/18  Yes Jearld Fenton, NP  metoprolol succinate (TOPROL-XL) 25 MG 24 hr tablet Take 1 tablet (25 mg total) by mouth daily. 10/19/18  Yes Baity, Coralie Keens, NP  Omega-3 Fatty Acids (FISH OIL) 1000 MG CAPS Take 1 capsule (1,000 mg total) by mouth daily. 04/11/11  Yes Lucille Passy, MD  simvastatin (ZOCOR) 40 MG tablet Take 1 tablet (40 mg total) by mouth at bedtime. Patient taking differently: Take 40 mg by mouth daily.  10/19/18  Yes Baity, Coralie Keens, NP  sodium fluoride (PREVIDENT 5000 PLUS) 1.1 % CREA dental cream Apply cream to  tooth brush. Brush teeth for 2 minutes. Spit out excess. DO NOT rinse afterwards. Repeat nightly. 11/22/18  Yes Lenn Cal, DDS  VITAMIN E PO Take 1 capsule by mouth daily.   Yes [provider]  clopidogrel (PLAVIX) 75 MG tablet TAKE 1 TABLET (75 MG TOTAL) BY MOUTH DAILY. 11/02/18   Jearld Fenton, NP  dexamethasone (DECADRON) 4 MG tablet Take 2 tablets (8 mg total) by mouth daily. Start the day after chemotherapy for 2 days. Take with food. 11/28/18   Tish Men, MD  lidocaine-prilocaine (EMLA) cream Apply to affected area once 11/28/18   Tish Men, MD  LORazepam (ATIVAN) 0.5 MG tablet Take 1 tablet (0.5 mg total) by mouth every 6 (six) hours as needed (Nausea or vomiting). 11/28/18   Tish Men, MD  ondansetron (ZOFRAN) 8 MG tablet Take 1 tablet (8 mg total) by mouth 2 (two) times daily as needed for refractory nausea / vomiting. Start on day 3 after chemotherapy. 11/28/18   Tish Men, MD  prochlorperazine (COMPAZINE) 10 MG tablet Take 1 tablet (10 mg total) by mouth every 6 (six) hours as needed (Nausea or vomiting). 11/28/18   Tish Men, MD     Family History  Problem Relation Age of Onset   Diabetes Mother    Colon cancer Neg Hx     Social History   Socioeconomic History   Marital status: Widowed    Spouse name: Not on file   Number of children: Not on file   Years of education: Not on file   Highest education level: Not on file  Occupational History   Occupation: [employer]    Employer: [employer] resource strain: Not on file   Food insecurity:    Worry: Not on file    Inability: Not on file   Transportation needs:    Medical: No    Non-medical: No  Tobacco Use   Smoking status: Former Smoker    Packs/day: 0.50    Types: Cigarettes    Last attempt to quit: 07/23/2014    Years since quitting: 4.3   Smokeless tobacco: Never Used   Tobacco comment: quit smoking cigarettes in " 2018-19"  Substance and Sexual Activity    Alcohol use: No    Alcohol/week: 0.0 standard drinks   Drug use: No   Sexual activity: Never  Lifestyle   Physical activity:    Days per week: Not on file    Minutes per session: Not on file   Stress: Not on file  Relationships   Social connections:    Talks on phone: Not on file    Gets together: Not on file    Attends religious service: Not on file    Active member of club or organization: Not on file    Attends meetings of clubs or  organizations: Not on file    Relationship status: Not on file  Other Topics Concern   Not on file  Social History Narrative   Does not have a living will.   Does not have any family.      [place] "make decisions" for her-desires CPR, does not want prolonged life support if futile.      Both of her children- died in car accident at ages 33 and 11- husband went into a diabetic coma while driving.     Review of Systems: A 12 point ROS discussed and pertinent positives are indicated in the HPI above.  All other systems are negative.  Review of Systems  Constitutional: Negative for fatigue and fever.  Respiratory: Negative for cough and shortness of breath.   Cardiovascular: Negative for chest pain.  Gastrointestinal: Negative for abdominal pain, nausea and vomiting.  Musculoskeletal: Negative for back pain.  Psychiatric/Behavioral: Negative for behavioral problems and confusion.    Vital Signs: BP 106/80    Pulse (!) 109    Temp 98.6 F (37 C) (Oral)    Resp 18    SpO2 97%   Physical Exam Vitals signs and nursing note reviewed.  Constitutional:      General: She is not in acute distress.    Appearance: Normal appearance.  Cardiovascular:     Rate and Rhythm: Normal rate and regular rhythm.     Heart sounds: Murmur present.  Pulmonary:     Effort: Pulmonary effort is normal. No respiratory distress.     Breath sounds: Normal breath sounds.  Abdominal:     General: Abdomen is flat. There is no distension.     Palpations:  Abdomen is soft.  Skin:    General: Skin is warm and dry.  Neurological:     General: No focal deficit present.     Mental Status: She is alert and oriented to person, place, and time. Mental status is at baseline.  Psychiatric:        Mood and Affect: Mood normal.        Behavior: Behavior normal.        Thought Content: Thought content normal.        Judgment: Judgment normal.      MD Evaluation Airway: WNL Heart: WNL Abdomen: WNL Chest/ Lungs: WNL ASA  Classification: 3 Mallampati/Airway Score: Two   Imaging: Nm Pet Image Initial (pi) Skull Base To Thigh  Result Date: 11/23/2018 CLINICAL DATA:  Initial treatment strategy for head neck carcinoma. Original carcinoma. EXAM: NUCLEAR MEDICINE PET SKULL BASE TO THIGH TECHNIQUE: 10.7 mCi F-18 FDG was injected intravenously. Full-ring PET imaging was performed from the skull base to thigh after the radiotracer. CT data was obtained and used for attenuation correction and anatomic localization. Fasting blood glucose: 125 mg/dl COMPARISON:  Neck CT 10/26/2018 FINDINGS: Mediastinal blood pool activity: SUV max 3.7 NECK: Intensely hypermetabolic supraglottic tissue predominantly on the LEFT but extending across midline anteriorly. Activity is intense with SUV max equal 33. Metabolic activity inferiorly to potentially involve the larynx on the LEFT. Bilateral intensely hypermetabolic enlarged level 2 lymph nodes. Activity is intense and uniform SUV max equal 42 on the RIGHT and SUV max 38 on the LEFT. Smaller hypermetabolic lymph nodes extend inferiorly to the level 3 nodal station on LEFT and RIGHT. Hypermetabolic lymph node LEFT level III with SUV max equal 15.6 measures 11 mm (image 51/4) adjacent to the carotid stent. No hypermetabolic supraclavicular nodes. No level I hypermetabolic lymph  nodes. Incidental CT findings: none CHEST: No hypermetabolic mediastinal or hilar nodes. No suspicious pulmonary nodules on the CT scan. Incidental CT  findings: none ABDOMEN/PELVIS: No abnormal hypermetabolic activity within the liver, pancreas, adrenal glands, or spleen. No hypermetabolic lymph nodes in the abdomen or pelvis. Incidental CT findings: Low-density enlargement of the LEFT adrenal gland without significant metabolic activity is favored benign. Lesion does not meet criteria for adenoma on noncontrast CT therefore favor lipid poor adenoma versus hyperplasia. SKELETON: No aggressive osseous lesion. Incidental CT findings: none IMPRESSION: 1. Intensely hypermetabolic supraglottic mass predominantly involving the LEFT aryepiglottic fold but extending across midline anteriorly. Inferiorly hypermetabolic activity extends to the level of the larynx on the LEFT. 2. Bilateral bulky intensely hypermetabolic metastatic level II lymph nodes. Metastatic adenopathy extends with smaller nodes in the level III nodal stations bilaterally. 3. No evidence of thoracic metastasis. 4. Enlarged LEFT adrenal gland consistent with hyperplasia or lipid poor adenoma. Electronically Signed   By: Suzy Bouchard M.D.   On: 11/23/2018 17:15    Labs:  CBC: Recent Labs    10/19/18 1417 11/08/18 0721 11/21/18 1251 12/06/18 1322  WBC 5.5 5.9 6.2 6.0  HGB 15.3* 15.4* 15.1* 15.1*  HCT 44.9 46.1* 45.4 45.5  PLT 334.0 352 340 362    COAGS: Recent Labs    12/06/18 1322  INR 1.0    BMP: Recent Labs    10/19/18 1417 11/08/18 0721 11/21/18 1251  NA 141 142 143  K 4.1 3.8 3.9  CL 101 102 103  CO2 32 27 29  GLUCOSE 131* 136* 124*  BUN 15 13 18   CALCIUM 10.1 9.9 9.9  CREATININE 0.80 0.78 0.82  GFRNONAA  --  >60 >60  GFRAA  --  >60 >60    LIVER FUNCTION TESTS: Recent Labs    10/19/18 1417 11/21/18 1251  BILITOT 0.5 0.4  AST 13 15  ALT 15 17  ALKPHOS 68 67  PROT 7.3 7.2  ALBUMIN 4.5 4.1    TUMOR MARKERS: No results for input(s): AFPTM, CEA, CA199, CHROMGRNA in the last 8760 hours.  Assessment and Plan: Patient with past medical history of  carotid artery occlusion s/p repair presents with complaint of new diagnosis of supraglottic squamous cell carcinoma.  IR consulted for Port-A-Cath placement at the request of Dr. Maylon Peppers. Case reviewed by Dr. Laurence Ferrari who approves patient for procedure.  Patient presents today in their usual state of health.  She has been NPO and is not currently on blood thinners as she has appropriately held her Plavix.  Risks and benefits of image guided port-a-catheter placement was discussed with the patient including, but not limited to bleeding, infection, pneumothorax, or fibrin sheath development and need for additional procedures.  All of the patient's questions were answered, patient is agreeable to proceed. Consent signed and in chart.  Thank you for this interesting consult.  I greatly enjoyed meeting Gloria Murray and look forward to participating in their care.  A copy of this report was sent to the requesting provider on this date.  Electronically Signed: Docia Barrier, PA 12/06/2018, 2:09 PM   I spent a total of  30 Minutes   in face to face in clinical consultation, greater than 50% of which was counseling/coordinating care for supraglottic cancer.

## 2018-12-06 NOTE — Telephone Encounter (Signed)
I left msg for pt to call the office for scheduling. I called central [place] to update office

## 2018-12-06 NOTE — Procedures (Signed)
Interventional Radiology Procedure Note  Procedure: Placement of a right IJ approach single lumen PowerPort.  Tip is positioned at the superior cavoatrial junction and catheter is ready for immediate use.  Complications: No immediate Recommendations:  - Ok to shower tomorrow - Do not submerge for 7 days - Routine line care   Signed,  Vicente Weidler K. Haevyn Ury, MD   

## 2018-12-06 NOTE — Telephone Encounter (Signed)
Acute appt on 10/19/18.Please advise.

## 2018-12-07 ENCOUNTER — Telehealth: Payer: Self-pay | Admitting: *Deleted

## 2018-12-07 NOTE — Telephone Encounter (Addendum)
Oncology Nurse Navigator Documentation  Called Ms. Narayan following my conversation with [place], [place], [employer].  Ms Mccreadie asked that Jeani Hawking call me re setting up surgical clearance appt with PCP Dr. Garnette Gunner in preparation for scheduling of G-tube placement at [place].    Per Jeani Hawking, I informed Ms. Schuff she has 10:00 appt with Dr. Garnette Gunner this coming Monday, explained that if she has cough, fever, sore throat, loss of taste or smell, she should call Dr. Reginold Agent office.  She voiced understanding she will be screened for these COVID-19 symptoms upon arrival.  In follow-up to Ms. Quickel's call earlier today re re-starting Plavix s/p yesterday's port placement, informed her per my call to PA [place], she can begin today.  I reviewed the registration/arrival procedures for Monday's first RT appointment.  She voiced understanding.  Gayleen Orem, RN, BSN Head & Neck Oncology Nurse [place] [facility] [place] [phone]

## 2018-12-07 NOTE — Telephone Encounter (Signed)
[place] Night - Client Nonclinical Telephone Record [facility] Night - Client Client Site [place] - Night Contact Type Call Who Is Calling Patient / Member / Family / Caregiver Caller Name soleia badolato Phone Number [phone] Call Type Message Only Information Provided Reason for Call Returning a Call from the Office Initial Comment returning call to the office to regina regarding referrals. pls call back Additional Comment since she has the port put in she is unable to drive today, can start to drive again tomorrow

## 2018-12-07 NOTE — Telephone Encounter (Signed)
I spoke with pt and was advised to call her navigator, Gayleen Orem at [place] to set up. [phone] I have left msg for Liliane Channel, if he calls back please set up appt for surgical clearance with Webb Silversmith, NP.

## 2018-12-10 ENCOUNTER — Telehealth: Payer: Self-pay | Admitting: Radiation Oncology

## 2018-12-10 ENCOUNTER — Other Ambulatory Visit: Payer: Self-pay

## 2018-12-10 ENCOUNTER — Ambulatory Visit: Payer: Medicare HMO | Admitting: Internal Medicine

## 2018-12-10 ENCOUNTER — Ambulatory Visit
Admission: RE | Admit: 2018-12-10 | Discharge: 2018-12-10 | Disposition: A | Discharge status: Home / Self Care | Payer: Medicare HMO | Source: Ambulatory Visit | Attending: Radiation Oncology | Admitting: Radiation Oncology

## 2018-12-10 ENCOUNTER — Other Ambulatory Visit: Payer: Self-pay | Admitting: Radiation Oncology

## 2018-12-10 DIAGNOSIS — Z79899 Other long term (current) drug therapy: Secondary | ICD-10-CM | POA: Diagnosis not present

## 2018-12-10 DIAGNOSIS — C321 Malignant neoplasm of supraglottis: Secondary | ICD-10-CM

## 2018-12-10 DIAGNOSIS — Z7902 Long term (current) use of antithrombotics/antiplatelets: Secondary | ICD-10-CM | POA: Diagnosis not present

## 2018-12-10 DIAGNOSIS — Z87891 Personal history of nicotine dependence: Secondary | ICD-10-CM | POA: Diagnosis not present

## 2018-12-10 DIAGNOSIS — Z79890 Hormone replacement therapy (postmenopausal): Secondary | ICD-10-CM | POA: Diagnosis not present

## 2018-12-10 DIAGNOSIS — Z951 Presence of aortocoronary bypass graft: Secondary | ICD-10-CM | POA: Diagnosis not present

## 2018-12-10 DIAGNOSIS — E890 Postprocedural hypothyroidism: Secondary | ICD-10-CM | POA: Diagnosis not present

## 2018-12-10 DIAGNOSIS — I10 Essential (primary) hypertension: Secondary | ICD-10-CM | POA: Diagnosis not present

## 2018-12-10 DIAGNOSIS — Z510 Encounter for antineoplastic radiation therapy: Secondary | ICD-10-CM | POA: Diagnosis not present

## 2018-12-10 MED ORDER — LIDOCAINE VISCOUS HCL 2 % MT SOLN: OROMUCOSAL | 5 refills | Status: AC

## 2018-12-10 MED ORDER — SONAFINE EX EMUL
1.0000 "application " | Freq: Once | CUTANEOUS | Status: AC
  Administered 2018-12-10: 1 via TOPICAL

## 2018-12-10 NOTE — Telephone Encounter (Signed)
Spoke with patient regarding her ride today. Told her to not hesitate to call me if she has any questions about her rides.

## 2018-12-10 NOTE — Progress Notes (Signed)
[place] OFFICE PROGRESS NOTE  Patient Care Team: Jearld Fenton, NP as PCP - General (Internal Medicine) Irene Shipper, MD as Consulting Physician (Gastroenterology) Early, Arvilla Meres, MD as Consulting Physician (Vascular Surgery) Serafina Mitchell, MD as Consulting Physician (Vascular Surgery) Eppie Gibson, MD as Attending Physician (Radiation Oncology) Leota Sauers, RN as Oncology Nurse Navigator (Oncology)  HEME/ONC OVERVIEW: 1. 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 R Level II LN 5 x 3.3cm; L Level II LN 5.4 x 2.7cm w/ suspicion for ECE) -10/2018: DL with bx by Dr. Redmond Baseman showed a mass involving the left aryepiglottic fold extending up to the epiglottic and BOT, bx showed invasive squamous cell carcinoma; PET showed FDG-avid supraglottic malignancy and bilateral bulky FDG-avid Level II and III cervical LN's, no mets  -Late 10/2018 - present: definitive chemoradiation with weekly carboplatin   2. 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 -RT started on 12/10/2018; 1st dose of chemotherapy scheduled on 12/13/2018 -Due to the patient's comorbidities and lack of social support, we elected to proceed with weekly carboplatin -I reinforced some of the benefits and potential toxicities of chemotherapy today, including but not limited to, nausea, vomiting, diarrhea, increased risk infection, and fatigue -Patient had some misunderstanding regarding the purpose of feeding tube and was trying to defer the placement until after chemoradiation due to living by herself and not having someone to stay with her after the feeding tube placement -I had a  very lengthy discussion with her regarding the purpose of the feeding tube, including maintaining nutrition and hydration, and why it needed to be done as soon as possible, rather than waiting until the completion of chemoradiation; after the lengthy discussion, patient expressed understanding and would call her friend to see when her friend would be able to stay with her -I have also reached out to the H&N navigator so that he can ensure that the patient gets the feeding tube ASAP  -Labs adequate, proceed with Cycle 1 of weekly carboplatin -PRN anti-emetics: Zofran, Compazine, Ativan, and dexamethasone  Cancer-related pain -Secondary to laryngeal cancer, as well as some jaw pain from recent dental manipulation -Currently on PRN Tylenol  -Given the anticipated radiation-associated mucositis, and that Tylenol may mask potential fever, I counseled the patient on minimizing Tylenol usage -Patient was concerned about opioid medication and its potential dependence; I counseled the patient on the purpose of opioid medication, including its short term usage to control pain during chemoradiation -She expressed understanding, and will contact us when she develops more symptoms that may require opioid medication  Hypomagnesemia -Mg 1.6 today, patient is asymptomatic -I have ordered 4g IV Mg sulfate to be given with chemotherapy on 12/13/2018 -In addition, I have prescribed oral Mg oxide 400mg  BID   No orders of the defined types were placed in this encounter.  All questions were answered. The patient knows to call the clinic with any problems, questions or concerns. No barriers to learning was detected.  A total of more than 40 minutes were spent face-to-face with the patient during this encounter and over half of that time was spent on counseling and coordination of care as outlined above.   Return in 1 week for labs, port flush, clinic  appt and Cycle 2 of weekly carboplatin.   Tish Men, MD 12/12/2018  10:46 AM  CHIEF COMPLAINT: "I am alright"  INTERVAL HISTORY: Ms. Soria returns to clinic for follow-up of squamous cell carcinoma of the supraglottic larynx on definitive chemoradiation.  Patient started her radiation on 12/10/2018, and so far has tolerated well without any significant side effects.  She reports that she has some mild intermittent left-sided jaw pain after recent dental manipulation, was 1 dose of Tylenol this morning with adequate pain relief.  She otherwise denies any other significant pain.  She has not set a date for feeding tube placement, and wants to put off the feeding tube until the end of chemoradiation because she doesn't have anyone to stay with her after the procedure. I had a very lengthy discussion with the patient about the importance of feeding tube placement to maintain nutrition and hydration during chemoradiation.   SUMMARY OF ONCOLOGIC HISTORY:   Malignant neoplasm of supraglottis ([place])   10/26/2018 Imaging    CT neck w/ contrast: IMPRESSION: 1. Bulky, partially necrotic bilateral cervical lymphadenopathy most concerning for metastatic disease, less likely unusual infection. 2. Prominent soft tissue at the tongue base with asymmetric left aryepiglottic fold thickening and slight fullness of the left palatine tonsil. Direct visualization is recommended to assess for a primary mucosal malignancy. 3.  Aortic Atherosclerosis (ICD10-I70.0).    11/08/2018 Procedure    DL with biopsy    11/08/2018 Pathology Results    Accession: [id number] 1. Larynx, biopsy, Left Supraglottis - INVASIVE SQUAMOUS CELL CARCINOMA. 2. Larynx, biopsy, Left Ary Epiglottic Fold - INVASIVE SQUAMOUS CELL CARCINOMA. 3. Tongue, biopsy, Left Vallecular Base - INVASIVE SQUAMOUS CELL CARCINOMA. 4. Larynx, biopsy, Right Vallecular Epiglottis - INVASIVE SQUAMOUS CELL CARCINOMA.    11/16/2018 Initial Diagnosis    Laryngeal cancer ([place])    11/23/2018 Imaging    PET: IMPRESSION: 1.  Intensely hypermetabolic supraglottic mass predominantly involving the LEFT aryepiglottic fold but extending across midline anteriorly. Inferiorly hypermetabolic activity extends to the level of the larynx on the LEFT. 2. Bilateral bulky intensely hypermetabolic metastatic level II lymph nodes. Metastatic adenopathy extends with smaller nodes in the level III nodal stations bilaterally. 3. No evidence of thoracic metastasis. 4. Enlarged LEFT adrenal gland consistent with hyperplasia or lipid poor adenoma.    11/27/2018 Cancer Staging    Staging form: Larynx - Supraglottis, AJCC 8th Edition - Clinical stage from 11/27/2018: Stage IVB (cT2, cN3b, cM0) - Signed by Eppie Gibson, MD on 11/27/2018    12/13/2018 -  Chemotherapy    The patient had palonosetron (ALOXI) injection 0.25 mg, 0.25 mg, Intravenous,  Once, 0 of 7 cycles CARBOplatin (PARAPLATIN) 210 mg in sodium chloride 0.9 % 100 mL chemo infusion, 210 mg (100 % of original dose 213.8 mg), Intravenous,  Once, 0 of 7 cycles Dose modification:   (original dose 213.8 mg, Cycle 1)  for chemotherapy treatment.      REVIEW OF SYSTEMS:   Constitutional: ( - ) fevers, ( - )  chills , ( - ) night sweats Eyes: ( - ) blurriness of vision, ( - ) double vision, ( - ) watery eyes Ears, nose, mouth, throat, and face: ( - ) mucositis, ( - ) sore throat Respiratory: ( - ) cough, ( - ) dyspnea, ( - ) wheezes Cardiovascular: ( - ) palpitation, ( - ) chest discomfort, ( - ) lower extremity swelling Gastrointestinal:  ( - ) nausea, ( - ) heartburn, ( - ) change in  bowel habits Skin: ( - ) abnormal skin rashes Lymphatics: ( - ) new lymphadenopathy, ( - ) easy bruising Neurological: ( - ) numbness, ( - ) tingling, ( - ) new weaknesses Behavioral/Psych: ( - ) mood change, ( - ) new changes  All other systems were reviewed with the patient and are negative.  I have reviewed the past medical history, past surgical history, social history and family history  with the patient and they are unchanged from previous note.  ALLERGIES:  is allergic to propoxyphene hcl and propoxyphene n-acetaminophen.  MEDICATIONS:  Current Outpatient Medications  Medication Sig Dispense Refill  . Ascorbic Acid (VITAMIN C PO) Take 1 tablet by mouth daily.    Marland Kitchen aspirin EC 81 MG tablet Take 81 mg by mouth daily.    Marland Kitchen CALCIUM PO Take 1 tablet by mouth daily.    . Cholecalciferol (VITAMIN D3 PO) Take 1 tablet by mouth daily.    . clopidogrel (PLAVIX) 75 MG tablet TAKE 1 TABLET (75 MG TOTAL) BY MOUTH DAILY. 90 tablet 3  . dexamethasone (DECADRON) 4 MG tablet Take 2 tablets (8 mg total) by mouth daily. Start the day after chemotherapy for 2 days. Take with food. 30 tablet 1  . fexofenadine (ALLEGRA) 180 MG tablet Take 180 mg by mouth at bedtime.     . fluticasone (FLONASE) 50 MCG/ACT nasal spray Place 1 spray into both nostrils daily.     . Garlic 2683 MG CAPS Take 1 capsule by mouth 2 (two) times daily.      . hydrochlorothiazide (MICROZIDE) 12.5 MG capsule Take 1 capsule (12.5 mg total) by mouth daily. 90 capsule 0  . levothyroxine (SYNTHROID, LEVOTHROID) 112 MCG tablet Take 1 tablet (112 mcg total) by mouth daily. 90 tablet 0  . lidocaine (XYLOCAINE) 2 % solution Patient: Mix 1part 2% viscous lidocaine, 1part H20. Swish & swallow 69mL of diluted mixture, 73min before meals and at bedtime, up to QID 100 mL 5  . lidocaine-prilocaine (EMLA) cream Apply to affected area once 30 g 3  . LORazepam (ATIVAN) 0.5 MG tablet Take 1 tablet (0.5 mg total) by mouth every 6 (six) hours as needed (Nausea or vomiting). 30 tablet 0  . magnesium oxide (MAG-OX) 400 (241.3 Mg) MG tablet Take 1 tablet (400 mg total) by mouth 2 (two) times daily for 30 days. 60 tablet 5  . metoprolol succinate (TOPROL-XL) 25 MG 24 hr tablet Take 1 tablet (25 mg total) by mouth daily. 90 tablet 0  . Omega-3 Fatty Acids (FISH OIL) 1000 MG CAPS Take 1 capsule (1,000 mg total) by mouth daily.    . ondansetron  (ZOFRAN) 8 MG tablet Take 1 tablet (8 mg total) by mouth 2 (two) times daily as needed for refractory nausea / vomiting. Start on day 3 after chemotherapy. 30 tablet 1  . prochlorperazine (COMPAZINE) 10 MG tablet Take 1 tablet (10 mg total) by mouth every 6 (six) hours as needed (Nausea or vomiting). 30 tablet 1  . simvastatin (ZOCOR) 40 MG tablet Take 1 tablet (40 mg total) by mouth at bedtime. (Patient taking differently: Take 40 mg by mouth daily. ) 90 tablet 0  . sodium fluoride (PREVIDENT 5000 PLUS) 1.1 % CREA dental cream Apply cream to tooth brush. Brush teeth for 2 minutes. Spit out excess. DO NOT rinse afterwards. Repeat nightly. 1 Tube prn  . VITAMIN E PO Take 1 capsule by mouth daily.     No current facility-administered medications for this visit.  PHYSICAL EXAMINATION: ECOG PERFORMANCE STATUS: 2 - Symptomatic, <50% confined to bed  Today's Vitals   12/12/18 0954  BP: 123/86  Pulse: 96  Resp: 19  Temp: 98.5 F (36.9 C)  TempSrc: Oral  SpO2: 100%  Weight: 211 lb 6.4 oz (95.9 kg)  Height: 5\' 5"  (1.651 m)   Body mass index is 35.18 kg/m.  Filed Weights   12/12/18 0954  Weight: 211 lb 6.4 oz (95.9 kg)    GENERAL: alert, no distress and comfortable, older than stated age SKIN: skin color, texture, turgor are normal, no rashes or significant lesions EYES: conjunctiva are pink and non-injected, sclera clear OROPHARYNX: no exudate, no erythema; lips, buccal mucosa, and tongue normal  NECK: supple, non-tender LUNGS: clear to auscultation with normal breathing effort HEART: regular rate & rhythm and no murmurs and no lower extremity edema ABDOMEN: soft, non-tender, non-distended, normal bowel sounds Musculoskeletal: no cyanosis of digits and no clubbing  PSYCH: alert & oriented x 3, fluent speech NEURO: no focal motor/sensory deficits  LABORATORY DATA:  I have reviewed the data as listed    Component Value Date/Time   NA 140 12/12/2018 0928   K 4.0 12/12/2018 0928    CL 102 12/12/2018 0928   CO2 28 12/12/2018 0928   GLUCOSE 104 (H) 12/12/2018 0928   BUN 14 12/12/2018 0928   CREATININE 0.79 12/12/2018 0928   CREATININE 0.89 10/06/2017 1504   CALCIUM 10.1 12/12/2018 0928   PROT 7.2 11/21/2018 1251   ALBUMIN 4.1 11/21/2018 1251   AST 15 11/21/2018 1251   ALT 17 11/21/2018 1251   ALKPHOS 67 11/21/2018 1251   BILITOT 0.4 11/21/2018 1251   GFRNONAA >60 12/12/2018 0928   GFRAA >60 12/12/2018 0928    No results found for: SPEP, UPEP  Lab Results  Component Value Date   WBC 6.6 12/12/2018   NEUTROABS 4.9 12/12/2018   HGB 14.8 12/12/2018   HCT 44.9 12/12/2018   MCV 93.5 12/12/2018   PLT 344 12/12/2018      Chemistry      Component Value Date/Time   NA 140 12/12/2018 0928   K 4.0 12/12/2018 0928   CL 102 12/12/2018 0928   CO2 28 12/12/2018 0928   BUN 14 12/12/2018 0928   CREATININE 0.79 12/12/2018 0928   CREATININE 0.89 10/06/2017 1504      Component Value Date/Time   CALCIUM 10.1 12/12/2018 0928   ALKPHOS 67 11/21/2018 1251   AST 15 11/21/2018 1251   ALT 17 11/21/2018 1251   BILITOT 0.4 11/21/2018 1251       RADIOGRAPHIC STUDIES: I have personally reviewed the radiological images as listed below and agreed with the findings in the report. Nm Pet Image Initial (pi) Skull Base To Thigh  Result Date: 11/23/2018 CLINICAL DATA:  Initial treatment strategy for head neck carcinoma. Original carcinoma. EXAM: NUCLEAR MEDICINE PET SKULL BASE TO THIGH TECHNIQUE: 10.7 mCi F-18 FDG was injected intravenously. Full-ring PET imaging was performed from the skull base to thigh after the radiotracer. CT data was obtained and used for attenuation correction and anatomic localization. Fasting blood glucose: 125 mg/dl COMPARISON:  Neck CT 10/26/2018 FINDINGS: Mediastinal blood pool activity: SUV max 3.7 NECK: Intensely hypermetabolic supraglottic tissue predominantly on the LEFT but extending across midline anteriorly. Activity is intense with SUV max  equal 33. Metabolic activity inferiorly to potentially involve the larynx on the LEFT. Bilateral intensely hypermetabolic enlarged level 2 lymph nodes. Activity is intense and uniform SUV max equal 42 on the  RIGHT and SUV max 38 on the LEFT. Smaller hypermetabolic lymph nodes extend inferiorly to the level 3 nodal station on LEFT and RIGHT. Hypermetabolic lymph node LEFT level III with SUV max equal 15.6 measures 11 mm (image 51/4) adjacent to the carotid stent. No hypermetabolic supraclavicular nodes. No level I hypermetabolic lymph nodes. Incidental CT findings: none CHEST: No hypermetabolic mediastinal or hilar nodes. No suspicious pulmonary nodules on the CT scan. Incidental CT findings: none ABDOMEN/PELVIS: No abnormal hypermetabolic activity within the liver, pancreas, adrenal glands, or spleen. No hypermetabolic lymph nodes in the abdomen or pelvis. Incidental CT findings: Low-density enlargement of the LEFT adrenal gland without significant metabolic activity is favored benign. Lesion does not meet criteria for adenoma on noncontrast CT therefore favor lipid poor adenoma versus hyperplasia. SKELETON: No aggressive osseous lesion. Incidental CT findings: none IMPRESSION: 1. Intensely hypermetabolic supraglottic mass predominantly involving the LEFT aryepiglottic fold but extending across midline anteriorly. Inferiorly hypermetabolic activity extends to the level of the larynx on the LEFT. 2. Bilateral bulky intensely hypermetabolic metastatic level II lymph nodes. Metastatic adenopathy extends with smaller nodes in the level III nodal stations bilaterally. 3. No evidence of thoracic metastasis. 4. Enlarged LEFT adrenal gland consistent with hyperplasia or lipid poor adenoma. Electronically Signed   By: Suzy Bouchard M.D.   On: 11/23/2018 17:15   [facility] Guided Port Insertion  Result Date: 12/06/2018 INDICATION: 68 year old female with history of laryngeal cancer. She requires durable venous access  for chemotherapy. EXAM: IMPLANTED PORT A CATH PLACEMENT WITH ULTRASOUND AND FLUOROSCOPIC GUIDANCE MEDICATIONS: 2 g Ancef; The antibiotic was administered within an appropriate time interval prior to skin puncture. ANESTHESIA/SEDATION: Versed 4 mg IV; Fentanyl 100 mcg IV; Moderate Sedation Time:  22 minutes The patient was continuously monitored during the procedure by the interventional radiology nurse under my direct supervision. FLUOROSCOPY TIME:  0 minutes, 42 seconds (11 mGy) COMPLICATIONS: None immediate. PROCEDURE: The right neck and chest was prepped with chlorhexidine, and draped in the usual sterile fashion using maximum barrier technique (cap and mask, sterile gown, sterile gloves, large sterile sheet, hand hygiene and cutaneous antiseptic). Local anesthesia was attained by infiltration with 1% lidocaine with epinephrine. Ultrasound demonstrated patency of the right internal jugular vein, and this was documented with an image. Under real-time ultrasound guidance, this vein was accessed with a 21 gauge micropuncture needle and image documentation was performed. A small dermatotomy was made at the access site with an 11 scalpel. A 0.018" wire was advanced into the SVC and the access needle exchanged for a 14F micropuncture vascular sheath. The 0.018" wire was then removed and a 0.035" wire advanced into the IVC. An appropriate location for the subcutaneous reservoir was selected below the clavicle and an incision was made through the skin and underlying soft tissues. The subcutaneous tissues were then dissected using a combination of blunt and sharp surgical technique and a pocket was formed. A single lumen power injectable portacatheter was then tunneled through the subcutaneous tissues from the pocket to the dermatotomy and the port reservoir placed within the subcutaneous pocket. The venous access site was then serially dilated and a peel away vascular sheath placed over the wire. The wire was removed and  the port catheter advanced into position under fluoroscopic guidance. The catheter tip is positioned in the superior cavoatrial junction. This was documented with a spot image. The portacatheter was then tested and found to flush and aspirate well. The port was flushed with saline followed by 100 units/mL heparinized  saline. The pocket was then closed in two layers using first subdermal inverted interrupted absorbable sutures followed by a running subcuticular suture. The epidermis was then sealed with Dermabond. The dermatotomy at the venous access site was also closed with Dermabond. IMPRESSION: Successful placement of a right IJ approach Power Port with ultrasound and fluoroscopic guidance. The catheter is ready for use. Electronically Signed   By: Jacqulynn Cadet M.D.   On: 12/06/2018 15:31

## 2018-12-10 NOTE — Progress Notes (Signed)

## 2018-12-10 NOTE — Progress Notes (Deleted)
Subjective:    Patient ID: Gloria Murray, female    DOB: [birth year], 68 y.o.   MRN: [id number]  HPI  Pt due for surgical clearance. She has laryngeal cancer. She is having a port placed. She is having a G-tube place. She is currently undergoing radiation.  Review of Systems      Past Medical History:  Diagnosis Date  . Allergy   . Carotid artery occlusion   . Heart murmur   . Hyperlipidemia   . Hypertension   . Hypothyroidism   . Laryngeal mass   . Osteoporosis   . Thyroid disease   . Wears glasses   . Wears upper complete and lower partial dentures     Current Outpatient Medications  Medication Sig Dispense Refill  . Ascorbic Acid (VITAMIN C PO) Take 1 tablet by mouth daily.    Marland Kitchen aspirin EC 81 MG tablet Take 81 mg by mouth daily.    Marland Kitchen CALCIUM PO Take 1 tablet by mouth daily.    . Cholecalciferol (VITAMIN D3 PO) Take 1 tablet by mouth daily.    . clopidogrel (PLAVIX) 75 MG tablet TAKE 1 TABLET (75 MG TOTAL) BY MOUTH DAILY. 90 tablet 3  . dexamethasone (DECADRON) 4 MG tablet Take 2 tablets (8 mg total) by mouth daily. Start the day after chemotherapy for 2 days. Take with food. 30 tablet 1  . fexofenadine (ALLEGRA) 180 MG tablet Take 180 mg by mouth at bedtime.     . fluticasone (FLONASE) 50 MCG/ACT nasal spray Place 1 spray into both nostrils daily.     . Garlic 7517 MG CAPS Take 1 capsule by mouth 2 (two) times daily.      . hydrochlorothiazide (MICROZIDE) 12.5 MG capsule Take 1 capsule (12.5 mg total) by mouth daily. 90 capsule 0  . levothyroxine (SYNTHROID, LEVOTHROID) 112 MCG tablet Take 1 tablet (112 mcg total) by mouth daily. 90 tablet 0  . lidocaine-prilocaine (EMLA) cream Apply to affected area once 30 g 3  . LORazepam (ATIVAN) 0.5 MG tablet Take 1 tablet (0.5 mg total) by mouth every 6 (six) hours as needed (Nausea or vomiting). 30 tablet 0  . metoprolol succinate (TOPROL-XL) 25 MG 24 hr tablet Take 1 tablet (25 mg total) by mouth daily. 90 tablet 0  . Omega-3  Fatty Acids (FISH OIL) 1000 MG CAPS Take 1 capsule (1,000 mg total) by mouth daily.    . ondansetron (ZOFRAN) 8 MG tablet Take 1 tablet (8 mg total) by mouth 2 (two) times daily as needed for refractory nausea / vomiting. Start on day 3 after chemotherapy. 30 tablet 1  . prochlorperazine (COMPAZINE) 10 MG tablet Take 1 tablet (10 mg total) by mouth every 6 (six) hours as needed (Nausea or vomiting). 30 tablet 1  . simvastatin (ZOCOR) 40 MG tablet Take 1 tablet (40 mg total) by mouth at bedtime. (Patient taking differently: Take 40 mg by mouth daily. ) 90 tablet 0  . sodium fluoride (PREVIDENT 5000 PLUS) 1.1 % CREA dental cream Apply cream to tooth brush. Brush teeth for 2 minutes. Spit out excess. DO NOT rinse afterwards. Repeat nightly. 1 Tube prn  . VITAMIN E PO Take 1 capsule by mouth daily.     No current facility-administered medications for this visit.     Allergies  Allergen Reactions  . Propoxyphene Hcl Itching and Nausea Only    REACTION: nausea and itch  . Propoxyphene N-Acetaminophen Itching and Nausea Only    REACTION: nausea and  itch    Family History  Problem Relation Age of Onset  . Diabetes Mother   . Colon cancer Neg Hx     Social History   Socioeconomic History  . Marital status: Widowed    Spouse name: Not on file  . Number of children: Not on file  . Years of education: Not on file  . Highest education level: Not on file  Occupational History  . Occupation: Retail buyer: [employer]  . Financial resource strain: Not on file  . Food insecurity:    Worry: Not on file    Inability: Not on file  . Transportation needs:    Medical: No    Non-medical: No  Tobacco Use  . Smoking status: Former Smoker    Packs/day: 0.50    Types: Cigarettes    Last attempt to quit: 07/23/2014    Years since quitting: 4.3  . Smokeless tobacco: Never Used  . Tobacco comment: quit smoking cigarettes in " 2018-19"  Substance and Sexual Activity  .  Alcohol use: No    Alcohol/week: 0.0 standard drinks  . Drug use: No  . Sexual activity: Never  Lifestyle  . Physical activity:    Days per week: Not on file    Minutes per session: Not on file  . Stress: Not on file  Relationships  . Social connections:    Talks on phone: Not on file    Gets together: Not on file    Attends religious service: Not on file    Active member of club or organization: Not on file    Attends meetings of clubs or organizations: Not on file    Relationship status: Not on file  . Intimate partner violence:    Fear of current or ex partner: No    Emotionally abused: No    Physically abused: No    Forced sexual activity: No  Other Topics Concern  . Not on file  Social History Narrative   Does not have a living will.   Does not have any family.      [place] "make decisions" for her-desires CPR, does not want prolonged life support if futile.      Both of her children- died in car accident at ages 50 and 73- husband went into a diabetic coma while driving.     Constitutional: Denies fever, malaise, fatigue, headache or abrupt weight changes.  HEENT: Denies eye pain, eye redness, ear pain, ringing in the ears, wax buildup, runny nose, nasal congestion, bloody nose, or sore throat. Respiratory: Denies difficulty breathing, shortness of breath, cough or sputum production.   Cardiovascular: Denies chest pain, chest tightness, palpitations or swelling in the hands or feet.  Gastrointestinal: Denies abdominal pain, bloating, constipation, diarrhea or blood in the stool.  GU: Denies urgency, frequency, pain with urination, burning sensation, blood in urine, odor or discharge. Musculoskeletal: Denies decrease in range of motion, difficulty with gait, muscle pain or joint pain and swelling.  Skin: Denies redness, rashes, lesions or ulcercations.  Neurological: Denies dizziness, difficulty with memory, difficulty with speech or problems with balance and  coordination.  Psych: Denies anxiety, depression, SI/HI.  No other specific complaints in a complete review of systems (except as listed in HPI above).  Objective:   Physical Exam        Assessment & Plan:   Encounter for Surgical Clearance:

## 2018-12-11 ENCOUNTER — Ambulatory Visit
Admission: RE | Admit: 2018-12-11 | Discharge: 2018-12-11 | Disposition: A | Discharge status: Home / Self Care | Payer: Medicare HMO | Source: Ambulatory Visit | Attending: Radiation Oncology | Admitting: Radiation Oncology

## 2018-12-11 ENCOUNTER — Telehealth: Payer: Self-pay | Admitting: Radiation Oncology

## 2018-12-11 ENCOUNTER — Other Ambulatory Visit: Payer: Self-pay

## 2018-12-11 DIAGNOSIS — Z951 Presence of aortocoronary bypass graft: Secondary | ICD-10-CM | POA: Diagnosis not present

## 2018-12-11 DIAGNOSIS — E890 Postprocedural hypothyroidism: Secondary | ICD-10-CM | POA: Diagnosis not present

## 2018-12-11 DIAGNOSIS — Z87891 Personal history of nicotine dependence: Secondary | ICD-10-CM | POA: Diagnosis not present

## 2018-12-11 DIAGNOSIS — Z510 Encounter for antineoplastic radiation therapy: Secondary | ICD-10-CM | POA: Diagnosis not present

## 2018-12-11 DIAGNOSIS — Z7902 Long term (current) use of antithrombotics/antiplatelets: Secondary | ICD-10-CM | POA: Diagnosis not present

## 2018-12-11 DIAGNOSIS — Z79899 Other long term (current) drug therapy: Secondary | ICD-10-CM | POA: Diagnosis not present

## 2018-12-11 DIAGNOSIS — I10 Essential (primary) hypertension: Secondary | ICD-10-CM | POA: Diagnosis not present

## 2018-12-11 DIAGNOSIS — Z79890 Hormone replacement therapy (postmenopausal): Secondary | ICD-10-CM | POA: Diagnosis not present

## 2018-12-11 DIAGNOSIS — C321 Malignant neoplasm of supraglottis: Secondary | ICD-10-CM | POA: Diagnosis not present

## 2018-12-11 NOTE — Telephone Encounter (Signed)
Spoke with the patient and let her know about her ride today. I auto confirmed her rides for her because she is having difficulty with that. Gave her her pick up times for her appointments.

## 2018-12-12 ENCOUNTER — Ambulatory Visit
Admission: RE | Admit: 2018-12-12 | Discharge: 2018-12-12 | Disposition: A | Discharge status: Home / Self Care | Payer: Medicare HMO | Source: Ambulatory Visit | Attending: Radiation Oncology | Admitting: Radiation Oncology

## 2018-12-12 ENCOUNTER — Other Ambulatory Visit: Payer: Medicare HMO

## 2018-12-12 ENCOUNTER — Other Ambulatory Visit: Payer: Self-pay

## 2018-12-12 ENCOUNTER — Ambulatory Visit: Payer: Medicare HMO | Admitting: Hematology

## 2018-12-12 ENCOUNTER — Encounter: Payer: Self-pay | Admitting: Hematology

## 2018-12-12 ENCOUNTER — Telehealth: Payer: Self-pay | Admitting: *Deleted

## 2018-12-12 ENCOUNTER — Inpatient Hospital Stay: Payer: Medicare HMO | Attending: Hematology

## 2018-12-12 ENCOUNTER — Inpatient Hospital Stay: Payer: Medicare HMO

## 2018-12-12 ENCOUNTER — Inpatient Hospital Stay (HOSPITAL_BASED_OUTPATIENT_CLINIC_OR_DEPARTMENT_OTHER): Payer: Medicare HMO | Admitting: Hematology

## 2018-12-12 ENCOUNTER — Telehealth: Payer: Self-pay | Admitting: Hematology

## 2018-12-12 VITALS — BP 123/86 | HR 96 | Temp 98.5°F | Resp 19 | Ht 65.0 in | Wt 211.4 lb

## 2018-12-12 DIAGNOSIS — Z5111 Encounter for antineoplastic chemotherapy: Secondary | ICD-10-CM | POA: Diagnosis not present

## 2018-12-12 DIAGNOSIS — Z95828 Presence of other vascular implants and grafts: Secondary | ICD-10-CM

## 2018-12-12 DIAGNOSIS — C321 Malignant neoplasm of supraglottis: Secondary | ICD-10-CM

## 2018-12-12 DIAGNOSIS — D701 Agranulocytosis secondary to cancer chemotherapy: Secondary | ICD-10-CM | POA: Diagnosis not present

## 2018-12-12 DIAGNOSIS — Z7982 Long term (current) use of aspirin: Secondary | ICD-10-CM

## 2018-12-12 DIAGNOSIS — E890 Postprocedural hypothyroidism: Secondary | ICD-10-CM | POA: Diagnosis not present

## 2018-12-12 DIAGNOSIS — I10 Essential (primary) hypertension: Secondary | ICD-10-CM | POA: Diagnosis not present

## 2018-12-12 DIAGNOSIS — C779 Secondary and unspecified malignant neoplasm of lymph node, unspecified: Secondary | ICD-10-CM

## 2018-12-12 DIAGNOSIS — I700 Atherosclerosis of aorta: Secondary | ICD-10-CM | POA: Insufficient documentation

## 2018-12-12 DIAGNOSIS — G893 Neoplasm related pain (acute) (chronic): Secondary | ICD-10-CM | POA: Insufficient documentation

## 2018-12-12 DIAGNOSIS — Z7902 Long term (current) use of antithrombotics/antiplatelets: Secondary | ICD-10-CM | POA: Diagnosis not present

## 2018-12-12 DIAGNOSIS — Z510 Encounter for antineoplastic radiation therapy: Secondary | ICD-10-CM | POA: Diagnosis not present

## 2018-12-12 DIAGNOSIS — Z87891 Personal history of nicotine dependence: Secondary | ICD-10-CM | POA: Diagnosis not present

## 2018-12-12 DIAGNOSIS — T451X5A Adverse effect of antineoplastic and immunosuppressive drugs, initial encounter: Secondary | ICD-10-CM | POA: Diagnosis not present

## 2018-12-12 DIAGNOSIS — Z79899 Other long term (current) drug therapy: Secondary | ICD-10-CM

## 2018-12-12 DIAGNOSIS — Z951 Presence of aortocoronary bypass graft: Secondary | ICD-10-CM | POA: Diagnosis not present

## 2018-12-12 DIAGNOSIS — Z79890 Hormone replacement therapy (postmenopausal): Secondary | ICD-10-CM | POA: Diagnosis not present

## 2018-12-12 LAB — CBC WITH DIFFERENTIAL (CANCER CENTER ONLY)
Abs Immature Granulocytes: 0.02 10*3/uL (ref 0.00–0.07)
Basophils Absolute: 0.1 10*3/uL (ref 0.0–0.1)
Basophils Relative: 1 %
Eosinophils Absolute: 0.1 10*3/uL (ref 0.0–0.5)
Eosinophils Relative: 1 %
HCT: 44.9 % (ref 36.0–46.0)
Hemoglobin: 14.8 g/dL (ref 12.0–15.0)
Immature Granulocytes: 0 %
Lymphocytes Relative: 15 %
Lymphs Abs: 1 10*3/uL (ref 0.7–4.0)
MCH: 30.8 pg (ref 26.0–34.0)
MCHC: 33 g/dL (ref 30.0–36.0)
MCV: 93.5 fL (ref 80.0–100.0)
Monocytes Absolute: 0.5 10*3/uL (ref 0.1–1.0)
Monocytes Relative: 8 %
Neutro Abs: 4.9 10*3/uL (ref 1.7–7.7)
Neutrophils Relative %: 75 %
Platelet Count: 344 10*3/uL (ref 150–400)
RBC: 4.8 MIL/uL (ref 3.87–5.11)
RDW: 12.8 % (ref 11.5–15.5)
WBC Count: 6.6 10*3/uL (ref 4.0–10.5)
nRBC: 0 % (ref 0.0–0.2)

## 2018-12-12 LAB — BASIC METABOLIC PANEL - CANCER CENTER ONLY
Anion gap: 10 (ref 5–15)
BUN: 14 mg/dL (ref 8–23)
CO2: 28 mmol/L (ref 22–32)
Calcium: 10.1 mg/dL (ref 8.9–10.3)
Chloride: 102 mmol/L (ref 98–111)
Creatinine: 0.79 mg/dL (ref 0.44–1.00)
GFR, Est AFR Am: >60 mL/min (ref >60)
GFR, Estimated: >60 mL/min (ref >60)
Glucose, Bld: 104 mg/dL — ABNORMAL HIGH (ref 70–99)
Potassium: 4 mmol/L (ref 3.5–5.1)
Sodium: 140 mmol/L (ref 135–145)

## 2018-12-12 LAB — MAGNESIUM: Magnesium: 1.6 mg/dL — ABNORMAL LOW (ref 1.7–2.4)

## 2018-12-12 MED ORDER — HEPARIN SOD (PORK) LOCK FLUSH 100 UNIT/ML IV SOLN
500.0000 [IU] | Freq: Once | INTRAVENOUS | Status: AC
  Administered 2018-12-12: 500 [IU] via INTRAVENOUS
  Filled 2018-12-12: qty 5

## 2018-12-12 MED ORDER — MAGNESIUM OXIDE 400 (241.3 MG) MG PO TABS: 400.0000 mg | ORAL_TABLET | Freq: Two times a day (BID) | ORAL | 5 refills | Status: DC

## 2018-12-12 MED ORDER — SODIUM CHLORIDE 0.9% FLUSH
10.0000 mL | INTRAVENOUS | Status: DC | PRN
  Administered 2018-12-12: 10 mL via INTRAVENOUS
  Filled 2018-12-12: qty 10

## 2018-12-12 NOTE — Telephone Encounter (Signed)
Per 5/13 los, no change in appts

## 2018-12-12 NOTE — Telephone Encounter (Signed)
Received approval form from [facility] re:  Lorazepam  0.5 mg tablet from  12/11/18 -  08/01/19. Form sent for scan in Epic.

## 2018-12-12 NOTE — Progress Notes (Signed)
MD requested IV Mg Sulf 4 g IV over 2 hrs be added during tx 12/13/18. Orders entered in tx plan under hydration. This is compatible w/ Carboplatin.  Stable for up to 4 hours per [facility]. Kennith [place], Pharm.D., CPP 12/12/2018@11 :28 AM

## 2018-12-13 ENCOUNTER — Encounter: Payer: Self-pay | Admitting: Hematology

## 2018-12-13 ENCOUNTER — Other Ambulatory Visit: Payer: Self-pay

## 2018-12-13 ENCOUNTER — Inpatient Hospital Stay: Payer: Medicare HMO

## 2018-12-13 ENCOUNTER — Ambulatory Visit
Admission: RE | Admit: 2018-12-13 | Discharge: 2018-12-13 | Disposition: A | Discharge status: Home / Self Care | Payer: Medicare HMO | Source: Ambulatory Visit | Attending: Radiation Oncology | Admitting: Radiation Oncology

## 2018-12-13 VITALS — BP 115/83 | HR 98 | Temp 99.1°F | Resp 20 | Wt 210.0 lb

## 2018-12-13 DIAGNOSIS — Z510 Encounter for antineoplastic radiation therapy: Secondary | ICD-10-CM | POA: Diagnosis not present

## 2018-12-13 DIAGNOSIS — Z79899 Other long term (current) drug therapy: Secondary | ICD-10-CM | POA: Diagnosis not present

## 2018-12-13 DIAGNOSIS — Z87891 Personal history of nicotine dependence: Secondary | ICD-10-CM | POA: Diagnosis not present

## 2018-12-13 DIAGNOSIS — Z7902 Long term (current) use of antithrombotics/antiplatelets: Secondary | ICD-10-CM | POA: Diagnosis not present

## 2018-12-13 DIAGNOSIS — Z5111 Encounter for antineoplastic chemotherapy: Secondary | ICD-10-CM | POA: Diagnosis not present

## 2018-12-13 DIAGNOSIS — C779 Secondary and unspecified malignant neoplasm of lymph node, unspecified: Secondary | ICD-10-CM | POA: Diagnosis not present

## 2018-12-13 DIAGNOSIS — D701 Agranulocytosis secondary to cancer chemotherapy: Secondary | ICD-10-CM | POA: Diagnosis not present

## 2018-12-13 DIAGNOSIS — T451X5A Adverse effect of antineoplastic and immunosuppressive drugs, initial encounter: Secondary | ICD-10-CM | POA: Diagnosis not present

## 2018-12-13 DIAGNOSIS — I700 Atherosclerosis of aorta: Secondary | ICD-10-CM | POA: Diagnosis not present

## 2018-12-13 DIAGNOSIS — G893 Neoplasm related pain (acute) (chronic): Secondary | ICD-10-CM | POA: Diagnosis not present

## 2018-12-13 DIAGNOSIS — C321 Malignant neoplasm of supraglottis: Secondary | ICD-10-CM | POA: Diagnosis not present

## 2018-12-13 DIAGNOSIS — Z951 Presence of aortocoronary bypass graft: Secondary | ICD-10-CM | POA: Diagnosis not present

## 2018-12-13 DIAGNOSIS — I10 Essential (primary) hypertension: Secondary | ICD-10-CM | POA: Diagnosis not present

## 2018-12-13 DIAGNOSIS — Z79890 Hormone replacement therapy (postmenopausal): Secondary | ICD-10-CM | POA: Diagnosis not present

## 2018-12-13 DIAGNOSIS — E890 Postprocedural hypothyroidism: Secondary | ICD-10-CM | POA: Diagnosis not present

## 2018-12-13 MED ORDER — SODIUM CHLORIDE 0.9% FLUSH
10.0000 mL | INTRAVENOUS | Status: DC | PRN
  Administered 2018-12-13: 11:00:00 10 mL
  Filled 2018-12-13: qty 10

## 2018-12-13 MED ORDER — DEXAMETHASONE SODIUM PHOSPHATE 10 MG/ML IJ SOLN
10.0000 mg | Freq: Once | INTRAMUSCULAR | Status: AC
  Administered 2018-12-13: 09:00:00 10 mg via INTRAVENOUS

## 2018-12-13 MED ORDER — PALONOSETRON HCL INJECTION 0.25 MG/5ML
INTRAVENOUS | Status: AC
  Filled 2018-12-13: qty 5

## 2018-12-13 MED ORDER — PALONOSETRON HCL INJECTION 0.25 MG/5ML
0.2500 mg | Freq: Once | INTRAVENOUS | Status: AC
  Administered 2018-12-13: 0.25 mg via INTRAVENOUS

## 2018-12-13 MED ORDER — SODIUM CHLORIDE 0.9 % IV SOLN
213.8000 mg | Freq: Once | INTRAVENOUS | Status: AC
  Administered 2018-12-13: 10:00:00 210 mg via INTRAVENOUS
  Filled 2018-12-13: qty 21

## 2018-12-13 MED ORDER — DEXAMETHASONE SODIUM PHOSPHATE 10 MG/ML IJ SOLN
INTRAMUSCULAR | Status: AC
  Filled 2018-12-13: qty 1

## 2018-12-13 MED ORDER — HEPARIN SOD (PORK) LOCK FLUSH 100 UNIT/ML IV SOLN
500.0000 [IU] | Freq: Once | INTRAVENOUS | Status: AC | PRN
  Administered 2018-12-13: 11:00:00 500 [IU]
  Filled 2018-12-13: qty 5

## 2018-12-13 MED ORDER — MAGNESIUM SULFATE 4 GM/100ML IV SOLN
4.0000 g | Freq: Once | INTRAVENOUS | Status: AC
  Administered 2018-12-13: 09:00:00 4 g via INTRAVENOUS
  Filled 2018-12-13: qty 100

## 2018-12-13 MED ORDER — SODIUM CHLORIDE 0.9 % IV SOLN
Freq: Once | INTRAVENOUS | Status: AC
  Administered 2018-12-13: 09:00:00 via INTRAVENOUS
  Filled 2018-12-13: qty 250

## 2018-12-13 NOTE — Patient Instructions (Signed)
[facility] Discharge Instructions for Patients Receiving Chemotherapy  Today you received the following chemotherapy agents Carboplatin  To help prevent nausea and vomiting after your treatment, we encourage you to take your nausea medication: As directed by your MD   If you develop nausea and vomiting that is not controlled by your nausea medication, call the clinic.   BELOW ARE SYMPTOMS THAT SHOULD BE REPORTED IMMEDIATELY:  *FEVER GREATER THAN 100.5 F  *CHILLS WITH OR WITHOUT FEVER  NAUSEA AND VOMITING THAT IS NOT CONTROLLED WITH YOUR NAUSEA MEDICATION  *UNUSUAL SHORTNESS OF BREATH  *UNUSUAL BRUISING OR BLEEDING  TENDERNESS IN MOUTH AND THROAT WITH OR WITHOUT PRESENCE OF ULCERS  *URINARY PROBLEMS  *BOWEL PROBLEMS  UNUSUAL RASH Items with * indicate a potential emergency and should be followed up as soon as possible.  Feel free to call the clinic should you have any questions or concerns. The clinic phone number is [phone].  Please show the CHEMO ALERT CARD at check-in to the Emergency Department and triage nurse.  Coronavirus (COVID-19) Are you at risk?  Are you at risk for the Coronavirus (COVID-19)?  To be considered HIGH RISK for Coronavirus (COVID-19), you have to meet the following criteria:  . Traveled to [place], [place], [place], [place] or [place]; or in the [place] to [place], [place], [place], or [place]; and have fever, cough, and shortness of breath within the last 2 weeks of travel OR . Been in close contact with a person diagnosed with COVID-19 within the last 2 weeks and have fever, cough, and shortness of breath . IF YOU DO NOT MEET THESE CRITERIA, YOU ARE CONSIDERED LOW RISK FOR COVID-19.  What to do if you are HIGH RISK for COVID-19?  . If you are having a medical emergency, call 911. . Seek medical care right away. Before you go to a doctor's office, urgent care or emergency department, call ahead and tell them  about your recent travel, contact with someone diagnosed with COVID-19, and your symptoms. You should receive instructions from your physician's office regarding next steps of care.  . When you arrive at healthcare provider, tell the healthcare staff immediately you have returned from visiting [place], [place], [place], [place] or [place]; or traveled in the [place] to [place], [place], [place], or [place]; in the last two weeks or you have been in close contact with a person diagnosed with COVID-19 in the last 2 weeks.   . Tell the health care staff about your symptoms: fever, cough and shortness of breath. . After you have been seen by a medical provider, you will be either: o Tested for (COVID-19) and discharged home on quarantine except to seek medical care if symptoms worsen, and asked to  - Stay home and avoid contact with others until you get your results (4-5 days)  - Avoid travel on public transportation if possible (such as bus, train, or airplane) or o Sent to the Emergency Department by EMS for evaluation, COVID-19 testing, and possible admission depending on your condition and test results.  What to do if you are LOW RISK for COVID-19?  Reduce your risk of any infection by using the same precautions used for avoiding the common cold or flu:  . Wash your hands often with soap and warm water for at least 20 seconds.  If soap and water are not readily available, use an alcohol-based hand sanitizer with at least 60% alcohol.  . If   coughing or sneezing, cover your mouth and nose by coughing or sneezing into the elbow areas of your shirt or coat, into a tissue or into your sleeve (not your hands). . Avoid shaking hands with others and consider head nods or verbal greetings only. . Avoid touching your eyes, nose, or mouth with unwashed hands.  . Avoid close contact with people who are sick. . Avoid places or events with large numbers of people in one location, like concerts or  sporting events. . Carefully consider travel plans you have or are making. . If you are planning any travel outside or inside the [place], visit the CDC's Travelers' Health webpage for the latest health notices. . If you have some symptoms but not all symptoms, continue to monitor at home and seek medical attention if your symptoms worsen. . If you are having a medical emergency, call 911.   ADDITIONAL HEALTHCARE OPTIONS FOR PATIENTS  [facility] / e-Visit: [email or web address]         [facility]: [phone]  [facility]: [phone]                   [facility]: [phone]    

## 2018-12-13 NOTE — Progress Notes (Signed)
Met w/ pt to introduce myself as her Arboriculturist.  Unfortunately there aren't any foundations offering copay assistance for her Dx.  I offered the [place], went over what it covers and gave her the income requirement. She qualifies for the grant and was approved for the $700 [employer].  She has my card for any questions or concerns she may have in the future.

## 2018-12-14 ENCOUNTER — Other Ambulatory Visit: Payer: Self-pay

## 2018-12-14 ENCOUNTER — Telehealth: Payer: Self-pay | Admitting: *Deleted

## 2018-12-14 ENCOUNTER — Ambulatory Visit
Admission: RE | Admit: 2018-12-14 | Discharge: 2018-12-14 | Disposition: A | Discharge status: Home / Self Care | Payer: Medicare HMO | Source: Ambulatory Visit | Attending: Radiation Oncology | Admitting: Radiation Oncology

## 2018-12-14 ENCOUNTER — Telehealth: Payer: Self-pay | Admitting: Internal Medicine

## 2018-12-14 DIAGNOSIS — Z79890 Hormone replacement therapy (postmenopausal): Secondary | ICD-10-CM | POA: Diagnosis not present

## 2018-12-14 DIAGNOSIS — C321 Malignant neoplasm of supraglottis: Secondary | ICD-10-CM | POA: Diagnosis not present

## 2018-12-14 DIAGNOSIS — Z7902 Long term (current) use of antithrombotics/antiplatelets: Secondary | ICD-10-CM | POA: Diagnosis not present

## 2018-12-14 DIAGNOSIS — Z951 Presence of aortocoronary bypass graft: Secondary | ICD-10-CM | POA: Diagnosis not present

## 2018-12-14 DIAGNOSIS — I10 Essential (primary) hypertension: Secondary | ICD-10-CM | POA: Diagnosis not present

## 2018-12-14 DIAGNOSIS — R0981 Nasal congestion: Secondary | ICD-10-CM

## 2018-12-14 DIAGNOSIS — Z79899 Other long term (current) drug therapy: Secondary | ICD-10-CM | POA: Diagnosis not present

## 2018-12-14 DIAGNOSIS — E890 Postprocedural hypothyroidism: Secondary | ICD-10-CM | POA: Diagnosis not present

## 2018-12-14 DIAGNOSIS — Z87891 Personal history of nicotine dependence: Secondary | ICD-10-CM | POA: Diagnosis not present

## 2018-12-14 DIAGNOSIS — Z510 Encounter for antineoplastic radiation therapy: Secondary | ICD-10-CM | POA: Diagnosis not present

## 2018-12-14 NOTE — Telephone Encounter (Signed)
Attempted to call for chemo follow up. Mailbox full could not leave message

## 2018-12-14 NOTE — Telephone Encounter (Signed)
-----   Message from Lester [place], RN sent at 12/13/2018 10:20 AM EDT ----- Regarding: Dr. Maylon Peppers first time Carboplatin Pt. received first time Carboplatin and Magnesium replacement dose and tolerated well.

## 2018-12-17 ENCOUNTER — Other Ambulatory Visit: Payer: Self-pay

## 2018-12-17 ENCOUNTER — Other Ambulatory Visit: Payer: Self-pay | Admitting: Radiation Oncology

## 2018-12-17 ENCOUNTER — Ambulatory Visit
Admission: RE | Admit: 2018-12-17 | Discharge: 2018-12-17 | Disposition: A | Discharge status: Home / Self Care | Payer: Medicare HMO | Source: Ambulatory Visit | Attending: Radiation Oncology | Admitting: Radiation Oncology

## 2018-12-17 DIAGNOSIS — Z87891 Personal history of nicotine dependence: Secondary | ICD-10-CM | POA: Diagnosis not present

## 2018-12-17 DIAGNOSIS — I10 Essential (primary) hypertension: Secondary | ICD-10-CM | POA: Diagnosis not present

## 2018-12-17 DIAGNOSIS — Z951 Presence of aortocoronary bypass graft: Secondary | ICD-10-CM | POA: Diagnosis not present

## 2018-12-17 DIAGNOSIS — Z510 Encounter for antineoplastic radiation therapy: Secondary | ICD-10-CM | POA: Diagnosis not present

## 2018-12-17 DIAGNOSIS — E890 Postprocedural hypothyroidism: Secondary | ICD-10-CM | POA: Diagnosis not present

## 2018-12-17 DIAGNOSIS — Z7902 Long term (current) use of antithrombotics/antiplatelets: Secondary | ICD-10-CM | POA: Diagnosis not present

## 2018-12-17 DIAGNOSIS — Z79890 Hormone replacement therapy (postmenopausal): Secondary | ICD-10-CM | POA: Diagnosis not present

## 2018-12-17 DIAGNOSIS — Z79899 Other long term (current) drug therapy: Secondary | ICD-10-CM | POA: Diagnosis not present

## 2018-12-17 DIAGNOSIS — C321 Malignant neoplasm of supraglottis: Secondary | ICD-10-CM | POA: Diagnosis not present

## 2018-12-17 NOTE — Progress Notes (Signed)
[facility] OFFICE PROGRESS NOTE  Patient Care Team: Jearld Fenton, NP as PCP - General (Internal Medicine) Irene Shipper, MD as Consulting Physician (Gastroenterology) Early, Arvilla Meres, MD as Consulting Physician (Vascular Surgery) Serafina Mitchell, MD as Consulting Physician (Vascular Surgery) Eppie Gibson, MD as Attending Physician (Radiation Oncology) Leota Sauers, RN as Oncology Nurse Navigator (Oncology)  HEME/ONC OVERVIEW: 1. 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 R Level II LN 5 x 3.3cm; L Level II LN 5.4 x 2.7cm w/ suspicion for ECE) -10/2018: DL with bx by Dr. Redmond Baseman showed a mass involving the left aryepiglottic fold extending up to the epiglottic and BOT, bx showed invasive squamous cell carcinoma; PET showed FDG-avid supraglottic malignancy and bilateral bulky FDG-avid Level II and III cervical LN's, no mets  -Late 10/2018 - present: definitive chemoradiation with weekly carboplatin   2. 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 -S/p 1 cycle of weekly carboplatin concurrent with RT in the definitive setting  -Labs adequate today, proceed with Cycle 2 of chemotherapy -I had a very lengthy discussion with the patient during the last visit regarding the importance of feeding tube, but the patient lived alone and didn't know when her friend would be able to stay with her post procedure, especially after her friend became sick recently -I discussed with the patient that given the extenuating circumstances, we will continue treatment without feeding tube, but she needs to continue to maintain adequate oral intake  -Should she develop  any difficulty with eating or drinking, then we may have to re-visit the feeding tube  -PRN anti-emetics: Zofran, Compazine, Ativan and dexamethasone  Cancer-related pain -Secondary to laryngeal cancer and recent dental manipulation -Despite being counseled against taking Tylenol at the last visit, patient continues to take Tylenol every 4-6 hours -Counseled the patient again on importance of avoiding Tylenol use, as it can mask a fever; I have prescribed tramadol 50 mg q8hrs PRN for pain   Hypomagnesemia -On PO mag oxide but patient has not picked up the prescription due to lack of transportation  -Given the difficulty with transportation, I have added IV Mg 4g x 1 with the 2nd cycle of carboplatin   No orders of the defined types were placed in this encounter.  All questions were answered. The patient knows to call the clinic with any problems, questions or concerns. No barriers to learning was detected.  A total of more than 40 minutes were spent face-to-face with the patient during this encounter and over half of that time was spent on counseling and coordination of care as outlined above.   Return in 1 week for labs, port flush, clinic appt and Cycle 3 of carboplatin.  Tish Men, MD 12/19/2018 2:35 PM  CHIEF COMPLAINT: "I am doing fine so far"  INTERVAL HISTORY: Ms. Lorincz returns to clinic for follow-up of squamous of carcinoma of the supraglottic larynx on definitive chemoradiation with weekly carboplatin.  Patient reports that she has been tolerating her treatment relatively well so far and has not noticed any decrease in her oral intake.  She has not yet set up a date for her feeding tube placement, as a friend who normally provides her transportation has been sick, and  the patient has not even been able to pick up her prescriptions.  She still has intermittent pain in the left jaw area after dental manipulation, for which she has been taking Tylenol every 4-6 hours, despite being  counseled at the last visit about avoiding Tylenol.  She otherwise denies any other complaint today.  SUMMARY OF ONCOLOGIC HISTORY:   Malignant neoplasm of supraglottis ([place])   10/26/2018 Imaging    CT neck w/ contrast: IMPRESSION: 1. Bulky, partially necrotic bilateral cervical lymphadenopathy most concerning for metastatic disease, less likely unusual infection. 2. Prominent soft tissue at the tongue base with asymmetric left aryepiglottic fold thickening and slight fullness of the left palatine tonsil. Direct visualization is recommended to assess for a primary mucosal malignancy. 3.  Aortic Atherosclerosis (ICD10-I70.0).    11/08/2018 Procedure    DL with biopsy    11/08/2018 Pathology Results    Accession: [id number] 1. Larynx, biopsy, Left Supraglottis - INVASIVE SQUAMOUS CELL CARCINOMA. 2. Larynx, biopsy, Left Ary Epiglottic Fold - INVASIVE SQUAMOUS CELL CARCINOMA. 3. Tongue, biopsy, Left Vallecular Base - INVASIVE SQUAMOUS CELL CARCINOMA. 4. Larynx, biopsy, Right Vallecular Epiglottis - INVASIVE SQUAMOUS CELL CARCINOMA.    11/16/2018 Initial Diagnosis    Laryngeal cancer ([place])    11/23/2018 Imaging    PET: IMPRESSION: 1. Intensely hypermetabolic supraglottic mass predominantly involving the LEFT aryepiglottic fold but extending across midline anteriorly. Inferiorly hypermetabolic activity extends to the level of the larynx on the LEFT. 2. Bilateral bulky intensely hypermetabolic metastatic level II lymph nodes. Metastatic adenopathy extends with smaller nodes in the level III nodal stations bilaterally. 3. No evidence of thoracic metastasis. 4. Enlarged LEFT adrenal gland consistent with hyperplasia or lipid poor adenoma.    11/27/2018 Cancer Staging    Staging form: Larynx - Supraglottis, AJCC 8th Edition - Clinical stage from 11/27/2018: Stage IVB (cT2, cN3b, cM0) - Signed by Eppie Gibson, MD on 11/27/2018    12/13/2018 -  Chemotherapy    The patient had  palonosetron (ALOXI) injection 0.25 mg, 0.25 mg, Intravenous,  Once, 1 of 7 cycles Administration: 0.25 mg (12/13/2018) CARBOplatin (PARAPLATIN) 210 mg in sodium chloride 0.9 % 250 mL chemo infusion, 210 mg (100 % of original dose 213.8 mg), Intravenous,  Once, 1 of 7 cycles Dose modification:   (original dose 213.8 mg, Cycle 1) Administration: 210 mg (12/13/2018)  for chemotherapy treatment.      REVIEW OF SYSTEMS:   Constitutional: ( - ) fevers, ( - )  chills , ( - ) night sweats Eyes: ( - ) blurriness of vision, ( - ) double vision, ( - ) watery eyes Ears, nose, mouth, throat, and face: ( - ) mucositis, ( - ) sore throat Respiratory: ( - ) cough, ( - ) dyspnea, ( - ) wheezes Cardiovascular: ( - ) palpitation, ( - ) chest discomfort, ( - ) lower extremity swelling Gastrointestinal:  ( - ) nausea, ( - ) heartburn, ( - ) change in bowel habits Skin: ( - ) abnormal skin rashes Lymphatics: ( - ) new lymphadenopathy, ( - ) easy bruising Neurological: ( - ) numbness, ( - ) tingling, ( - ) new weaknesses Behavioral/Psych: ( - ) mood change, ( - ) new changes  All other systems were reviewed with the patient and are negative.  I have reviewed the past medical history, past surgical history, social history and family history with the patient and they are unchanged from previous note.  ALLERGIES:  is allergic to propoxyphene hcl and propoxyphene  n-acetaminophen.  MEDICATIONS:  Current Outpatient Medications  Medication Sig Dispense Refill  . Ascorbic Acid (VITAMIN C PO) Take 1 tablet by mouth daily.    Marland Kitchen aspirin EC 81 MG tablet Take 81 mg by mouth daily.    Marland Kitchen CALCIUM PO Take 1 tablet by mouth daily.    . Cholecalciferol (VITAMIN D3 PO) Take 1 tablet by mouth daily.    . clopidogrel (PLAVIX) 75 MG tablet TAKE 1 TABLET (75 MG TOTAL) BY MOUTH DAILY. 90 tablet 3  . fexofenadine (ALLEGRA) 180 MG tablet Take 180 mg by mouth at bedtime.     . fluticasone (FLONASE) 50 MCG/ACT nasal spray Place 1  spray into both nostrils daily.     . Garlic 7564 MG CAPS Take 1 capsule by mouth 2 (two) times daily.      . hydrochlorothiazide (MICROZIDE) 12.5 MG capsule TAKE 1 CAPSULE EVERY DAY 90 capsule 0  . levothyroxine (SYNTHROID) 112 MCG tablet TAKE 1 TABLET EVERY DAY 90 tablet 0  . lidocaine (XYLOCAINE) 2 % solution Patient: Mix 1part 2% viscous lidocaine, 1part H20. Swish & swallow 88mL of diluted mixture, 35min before meals and at bedtime, up to QID 100 mL 5  . lidocaine-prilocaine (EMLA) cream Apply to affected area once 30 g 3  . magnesium oxide (MAG-OX) 400 (241.3 Mg) MG tablet Take 1 tablet (400 mg total) by mouth 2 (two) times daily for 30 days. Please mail to the patient 60 tablet 5  . metoprolol succinate (TOPROL-XL) 25 MG 24 hr tablet TAKE 1 TABLET EVERY DAY 90 tablet 0  . Omega-3 Fatty Acids (FISH OIL) 1000 MG CAPS Take 1 capsule (1,000 mg total) by mouth daily.    . simvastatin (ZOCOR) 40 MG tablet TAKE 1 TABLET AT BEDTIME 90 tablet 0  . sodium fluoride (PREVIDENT 5000 PLUS) 1.1 % CREA dental cream Apply cream to tooth brush. Brush teeth for 2 minutes. Spit out excess. DO NOT rinse afterwards. Repeat nightly. 1 Tube prn  . VITAMIN E PO Take 1 capsule by mouth daily.    Marland Kitchen dexamethasone (DECADRON) 4 MG tablet Take 2 tablets (8 mg total) by mouth daily. Start the day after chemotherapy for 2 days. Take with food. (Patient not taking: Reported on 12/19/2018) 30 tablet 1  . LORazepam (ATIVAN) 0.5 MG tablet Take 1 tablet (0.5 mg total) by mouth every 6 (six) hours as needed (Nausea or vomiting). (Patient not taking: Reported on 12/19/2018) 30 tablet 0  . ondansetron (ZOFRAN) 8 MG tablet Take 1 tablet (8 mg total) by mouth 2 (two) times daily as needed for refractory nausea / vomiting. Start on day 3 after chemotherapy. (Patient not taking: Reported on 12/19/2018) 30 tablet 1  . prochlorperazine (COMPAZINE) 10 MG tablet Take 1 tablet (10 mg total) by mouth every 6 (six) hours as needed (Nausea or  vomiting). (Patient not taking: Reported on 12/19/2018) 30 tablet 1  . traMADol (ULTRAM) 50 MG tablet Take 1 tablet (50 mg total) by mouth every 8 (eight) hours as needed. Mail to the patient please 60 tablet 1   No current facility-administered medications for this visit.     PHYSICAL EXAMINATION: ECOG PERFORMANCE STATUS: 1 - Symptomatic but completely ambulatory  Today's Vitals   12/19/18 1350 12/19/18 1403  BP: 113/73   Pulse: 89   Resp: 18   Temp: 98.3 F (36.8 C)   TempSrc: Oral   SpO2: 97%   Weight: 207 lb 14.4 oz (94.3 kg)   Height: 5\' 5"  (1.651  m)   PainSc:  0-No pain   Body mass index is 34.6 kg/m.  Filed Weights   12/19/18 1350  Weight: 207 lb 14.4 oz (94.3 kg)    GENERAL: alert, no distress and comfortable SKIN: skin color, texture, turgor are normal, no rashes or significant lesions EYES: conjunctiva are pink and non-injected, sclera clear OROPHARYNX: no exudate, no erythema; lips, buccal mucosa, and tongue normal  NECK: supple, non-tender LYMPH: bulky bilateral cervical adenopathy, R > L  LUNGS: clear to auscultation with normal breathing effort HEART: regular rate & rhythm and no murmurs and no lower extremity edema ABDOMEN: soft, non-tender, non-distended, normal bowel sounds Musculoskeletal: no cyanosis of digits and no clubbing  PSYCH: alert & oriented x 3, fluent speech NEURO: no focal motor/sensory deficits  LABORATORY DATA:  I have reviewed the data as listed    Component Value Date/Time   NA 139 12/19/2018 1322   K 4.1 12/19/2018 1322   CL 101 12/19/2018 1322   CO2 28 12/19/2018 1322   GLUCOSE 91 12/19/2018 1322   BUN 14 12/19/2018 1322   CREATININE 0.76 12/19/2018 1322   CREATININE 0.89 10/06/2017 1504   CALCIUM 9.8 12/19/2018 1322   PROT 7.2 11/21/2018 1251   ALBUMIN 4.1 11/21/2018 1251   AST 15 11/21/2018 1251   ALT 17 11/21/2018 1251   ALKPHOS 67 11/21/2018 1251   BILITOT 0.4 11/21/2018 1251   GFRNONAA >60 12/19/2018 1322   GFRAA  >60 12/19/2018 1322    No results found for: SPEP, UPEP  Lab Results  Component Value Date   WBC 5.9 12/19/2018   NEUTROABS 4.5 12/19/2018   HGB 14.3 12/19/2018   HCT 42.8 12/19/2018   MCV 93.0 12/19/2018   PLT 340 12/19/2018      Chemistry      Component Value Date/Time   NA 139 12/19/2018 1322   K 4.1 12/19/2018 1322   CL 101 12/19/2018 1322   CO2 28 12/19/2018 1322   BUN 14 12/19/2018 1322   CREATININE 0.76 12/19/2018 1322   CREATININE 0.89 10/06/2017 1504      Component Value Date/Time   CALCIUM 9.8 12/19/2018 1322   ALKPHOS 67 11/21/2018 1251   AST 15 11/21/2018 1251   ALT 17 11/21/2018 1251   BILITOT 0.4 11/21/2018 1251       RADIOGRAPHIC STUDIES: I have personally reviewed the radiological images as listed below and agreed with the findings in the report. Nm Pet Image Initial (pi) Skull Base To Thigh  Result Date: 11/23/2018 CLINICAL DATA:  Initial treatment strategy for head neck carcinoma. Original carcinoma. EXAM: NUCLEAR MEDICINE PET SKULL BASE TO THIGH TECHNIQUE: 10.7 mCi F-18 FDG was injected intravenously. Full-ring PET imaging was performed from the skull base to thigh after the radiotracer. CT data was obtained and used for attenuation correction and anatomic localization. Fasting blood glucose: 125 mg/dl COMPARISON:  Neck CT 10/26/2018 FINDINGS: Mediastinal blood pool activity: SUV max 3.7 NECK: Intensely hypermetabolic supraglottic tissue predominantly on the LEFT but extending across midline anteriorly. Activity is intense with SUV max equal 33. Metabolic activity inferiorly to potentially involve the larynx on the LEFT. Bilateral intensely hypermetabolic enlarged level 2 lymph nodes. Activity is intense and uniform SUV max equal 42 on the RIGHT and SUV max 38 on the LEFT. Smaller hypermetabolic lymph nodes extend inferiorly to the level 3 nodal station on LEFT and RIGHT. Hypermetabolic lymph node LEFT level III with SUV max equal 15.6 measures 11 mm (image  51/4) adjacent to the  carotid stent. No hypermetabolic supraclavicular nodes. No level I hypermetabolic lymph nodes. Incidental CT findings: none CHEST: No hypermetabolic mediastinal or hilar nodes. No suspicious pulmonary nodules on the CT scan. Incidental CT findings: none ABDOMEN/PELVIS: No abnormal hypermetabolic activity within the liver, pancreas, adrenal glands, or spleen. No hypermetabolic lymph nodes in the abdomen or pelvis. Incidental CT findings: Low-density enlargement of the LEFT adrenal gland without significant metabolic activity is favored benign. Lesion does not meet criteria for adenoma on noncontrast CT therefore favor lipid poor adenoma versus hyperplasia. SKELETON: No aggressive osseous lesion. Incidental CT findings: none IMPRESSION: 1. Intensely hypermetabolic supraglottic mass predominantly involving the LEFT aryepiglottic fold but extending across midline anteriorly. Inferiorly hypermetabolic activity extends to the level of the larynx on the LEFT. 2. Bilateral bulky intensely hypermetabolic metastatic level II lymph nodes. Metastatic adenopathy extends with smaller nodes in the level III nodal stations bilaterally. 3. No evidence of thoracic metastasis. 4. Enlarged LEFT adrenal gland consistent with hyperplasia or lipid poor adenoma. Electronically Signed   By: Suzy Bouchard M.D.   On: 11/23/2018 17:15   Ir Imaging Guided Port Insertion  Result Date: 12/06/2018 INDICATION: 68 year old female with history of laryngeal cancer. She requires durable venous access for chemotherapy. EXAM: IMPLANTED PORT A CATH PLACEMENT WITH ULTRASOUND AND FLUOROSCOPIC GUIDANCE MEDICATIONS: 2 g Ancef; The antibiotic was administered within an appropriate time interval prior to skin puncture. ANESTHESIA/SEDATION: Versed 4 mg IV; Fentanyl 100 mcg IV; Moderate Sedation Time:  22 minutes The patient was continuously monitored during the procedure by the interventional radiology nurse under my direct  supervision. FLUOROSCOPY TIME:  0 minutes, 42 seconds (11 mGy) COMPLICATIONS: None immediate. PROCEDURE: The right neck and chest was prepped with chlorhexidine, and draped in the usual sterile fashion using maximum barrier technique (cap and mask, sterile gown, sterile gloves, large sterile sheet, hand hygiene and cutaneous antiseptic). Local anesthesia was attained by infiltration with 1% lidocaine with epinephrine. Ultrasound demonstrated patency of the right internal jugular vein, and this was documented with an image. Under real-time ultrasound guidance, this vein was accessed with a 21 gauge micropuncture needle and image documentation was performed. A small dermatotomy was made at the access site with an 11 scalpel. A 0.018" wire was advanced into the SVC and the access needle exchanged for a 65F micropuncture vascular sheath. The 0.018" wire was then removed and a 0.035" wire advanced into the IVC. An appropriate location for the subcutaneous reservoir was selected below the clavicle and an incision was made through the skin and underlying soft tissues. The subcutaneous tissues were then dissected using a combination of blunt and sharp surgical technique and a pocket was formed. A single lumen power injectable portacatheter was then tunneled through the subcutaneous tissues from the pocket to the dermatotomy and the port reservoir placed within the subcutaneous pocket. The venous access site was then serially dilated and a peel away vascular sheath placed over the wire. The wire was removed and the port catheter advanced into position under fluoroscopic guidance. The catheter tip is positioned in the superior cavoatrial junction. This was documented with a spot image. The portacatheter was then tested and found to flush and aspirate well. The port was flushed with saline followed by 100 units/mL heparinized saline. The pocket was then closed in two layers using first subdermal inverted interrupted absorbable  sutures followed by a running subcuticular suture. The epidermis was then sealed with Dermabond. The dermatotomy at the venous access site was also closed with Dermabond. IMPRESSION: Successful  placement of a right IJ approach Power Port with ultrasound and fluoroscopic guidance. The catheter is ready for use. Electronically Signed   By: Jacqulynn Cadet M.D.   On: 12/06/2018 15:31

## 2018-12-18 ENCOUNTER — Ambulatory Visit
Admission: RE | Admit: 2018-12-18 | Discharge: 2018-12-18 | Disposition: A | Discharge status: Home / Self Care | Payer: Medicare HMO | Source: Ambulatory Visit | Attending: Radiation Oncology | Admitting: Radiation Oncology

## 2018-12-18 ENCOUNTER — Other Ambulatory Visit: Payer: Self-pay

## 2018-12-18 ENCOUNTER — Inpatient Hospital Stay: Payer: Medicare HMO | Admitting: Nutrition

## 2018-12-18 DIAGNOSIS — Z79899 Other long term (current) drug therapy: Secondary | ICD-10-CM | POA: Diagnosis not present

## 2018-12-18 DIAGNOSIS — Z7902 Long term (current) use of antithrombotics/antiplatelets: Secondary | ICD-10-CM | POA: Diagnosis not present

## 2018-12-18 DIAGNOSIS — C321 Malignant neoplasm of supraglottis: Secondary | ICD-10-CM | POA: Diagnosis not present

## 2018-12-18 DIAGNOSIS — Z510 Encounter for antineoplastic radiation therapy: Secondary | ICD-10-CM | POA: Diagnosis not present

## 2018-12-18 DIAGNOSIS — I10 Essential (primary) hypertension: Secondary | ICD-10-CM | POA: Diagnosis not present

## 2018-12-18 DIAGNOSIS — Z951 Presence of aortocoronary bypass graft: Secondary | ICD-10-CM | POA: Diagnosis not present

## 2018-12-18 DIAGNOSIS — Z79890 Hormone replacement therapy (postmenopausal): Secondary | ICD-10-CM | POA: Diagnosis not present

## 2018-12-18 DIAGNOSIS — E890 Postprocedural hypothyroidism: Secondary | ICD-10-CM | POA: Diagnosis not present

## 2018-12-18 DIAGNOSIS — Z87891 Personal history of nicotine dependence: Secondary | ICD-10-CM | POA: Diagnosis not present

## 2018-12-18 NOTE — Progress Notes (Signed)
RD working remotely.  Nutrition follow up completed with patient. She is receiving weekly carboplatin and daily radiation for larynx cancer. She reports she is not having any nutrition impact symptoms. She is eating normally for her. Noted weight decreased to 211 pounds on May 13.  She has not tried oral nutrition shakes yet. States she does not have anyway to get to the grocery store. She lives alone and does not have family support. She may need to have a feeding tube.  Nutrition Diagnosis: Unintended weight loss continues.  Intervention:  Patient educated to continue soft foods as tolerated. Recommended increased calories and protein to minimize weight loss. Answered questions.  Monitoring, Evaluation, Goals: Patient will tolerate increased calories and protein to minimize weight loss.  Next Visit: Tuesday, May 26.

## 2018-12-18 NOTE — Telephone Encounter (Signed)
Rx sent through e-scribe Pt is aware as instructed

## 2018-12-18 NOTE — Telephone Encounter (Signed)
Pt left v/m ;that [facility] does not have approval for pts 5 meds and pt request cb.

## 2018-12-19 ENCOUNTER — Telehealth: Payer: Self-pay | Admitting: Hematology

## 2018-12-19 ENCOUNTER — Telehealth: Payer: Self-pay | Admitting: *Deleted

## 2018-12-19 ENCOUNTER — Telehealth: Payer: Self-pay

## 2018-12-19 ENCOUNTER — Inpatient Hospital Stay (HOSPITAL_BASED_OUTPATIENT_CLINIC_OR_DEPARTMENT_OTHER): Payer: Medicare HMO | Admitting: Hematology

## 2018-12-19 ENCOUNTER — Encounter: Payer: Self-pay | Admitting: Hematology

## 2018-12-19 ENCOUNTER — Inpatient Hospital Stay: Payer: Medicare HMO

## 2018-12-19 ENCOUNTER — Encounter: Payer: Self-pay | Admitting: Nutrition

## 2018-12-19 ENCOUNTER — Ambulatory Visit
Admission: RE | Admit: 2018-12-19 | Discharge: 2018-12-19 | Disposition: A | Discharge status: Home / Self Care | Payer: Medicare HMO | Source: Ambulatory Visit | Attending: Radiation Oncology | Admitting: Radiation Oncology

## 2018-12-19 ENCOUNTER — Other Ambulatory Visit: Payer: Self-pay

## 2018-12-19 ENCOUNTER — Other Ambulatory Visit: Payer: Self-pay | Admitting: Hematology

## 2018-12-19 VITALS — BP 113/73 | HR 89 | Temp 98.3°F | Resp 18 | Ht 65.0 in | Wt 207.9 lb

## 2018-12-19 DIAGNOSIS — I700 Atherosclerosis of aorta: Secondary | ICD-10-CM | POA: Diagnosis not present

## 2018-12-19 DIAGNOSIS — Z7902 Long term (current) use of antithrombotics/antiplatelets: Secondary | ICD-10-CM | POA: Diagnosis not present

## 2018-12-19 DIAGNOSIS — G893 Neoplasm related pain (acute) (chronic): Secondary | ICD-10-CM | POA: Diagnosis not present

## 2018-12-19 DIAGNOSIS — Z951 Presence of aortocoronary bypass graft: Secondary | ICD-10-CM | POA: Diagnosis not present

## 2018-12-19 DIAGNOSIS — Z95828 Presence of other vascular implants and grafts: Secondary | ICD-10-CM | POA: Insufficient documentation

## 2018-12-19 DIAGNOSIS — T451X5A Adverse effect of antineoplastic and immunosuppressive drugs, initial encounter: Secondary | ICD-10-CM | POA: Diagnosis not present

## 2018-12-19 DIAGNOSIS — Z510 Encounter for antineoplastic radiation therapy: Secondary | ICD-10-CM | POA: Diagnosis not present

## 2018-12-19 DIAGNOSIS — C321 Malignant neoplasm of supraglottis: Secondary | ICD-10-CM | POA: Diagnosis not present

## 2018-12-19 DIAGNOSIS — Z5111 Encounter for antineoplastic chemotherapy: Secondary | ICD-10-CM

## 2018-12-19 DIAGNOSIS — Z87891 Personal history of nicotine dependence: Secondary | ICD-10-CM | POA: Diagnosis not present

## 2018-12-19 DIAGNOSIS — D701 Agranulocytosis secondary to cancer chemotherapy: Secondary | ICD-10-CM | POA: Diagnosis not present

## 2018-12-19 DIAGNOSIS — Z79890 Hormone replacement therapy (postmenopausal): Secondary | ICD-10-CM | POA: Diagnosis not present

## 2018-12-19 DIAGNOSIS — C779 Secondary and unspecified malignant neoplasm of lymph node, unspecified: Secondary | ICD-10-CM | POA: Diagnosis not present

## 2018-12-19 DIAGNOSIS — Z79899 Other long term (current) drug therapy: Secondary | ICD-10-CM | POA: Diagnosis not present

## 2018-12-19 DIAGNOSIS — I10 Essential (primary) hypertension: Secondary | ICD-10-CM | POA: Diagnosis not present

## 2018-12-19 DIAGNOSIS — E890 Postprocedural hypothyroidism: Secondary | ICD-10-CM | POA: Diagnosis not present

## 2018-12-19 DIAGNOSIS — Z7982 Long term (current) use of aspirin: Secondary | ICD-10-CM

## 2018-12-19 LAB — BASIC METABOLIC PANEL - CANCER CENTER ONLY
Anion gap: 10 (ref 5–15)
BUN: 14 mg/dL (ref 8–23)
CO2: 28 mmol/L (ref 22–32)
Calcium: 9.8 mg/dL (ref 8.9–10.3)
Chloride: 101 mmol/L (ref 98–111)
Creatinine: 0.76 mg/dL (ref 0.44–1.00)
GFR, Est AFR Am: >60 mL/min (ref >60)
GFR, Estimated: >60 mL/min (ref >60)
Glucose, Bld: 91 mg/dL (ref 70–99)
Potassium: 4.1 mmol/L (ref 3.5–5.1)
Sodium: 139 mmol/L (ref 135–145)

## 2018-12-19 LAB — CBC WITH DIFFERENTIAL (CANCER CENTER ONLY)
Abs Immature Granulocytes: 0.02 K/uL (ref 0.00–0.07)
Basophils Absolute: 0 K/uL (ref 0.0–0.1)
Basophils Relative: 1 %
Eosinophils Absolute: 0.1 K/uL (ref 0.0–0.5)
Eosinophils Relative: 2 %
HCT: 42.8 % (ref 36.0–46.0)
Hemoglobin: 14.3 g/dL (ref 12.0–15.0)
Immature Granulocytes: 0 %
Lymphocytes Relative: 13 %
Lymphs Abs: 0.8 K/uL (ref 0.7–4.0)
MCH: 31.1 pg (ref 26.0–34.0)
MCHC: 33.4 g/dL (ref 30.0–36.0)
MCV: 93 fL (ref 80.0–100.0)
Monocytes Absolute: 0.4 K/uL (ref 0.1–1.0)
Monocytes Relative: 7 %
Neutro Abs: 4.5 K/uL (ref 1.7–7.7)
Neutrophils Relative %: 77 %
Platelet Count: 340 K/uL (ref 150–400)
RBC: 4.6 MIL/uL (ref 3.87–5.11)
RDW: 12.7 % (ref 11.5–15.5)
WBC Count: 5.9 K/uL (ref 4.0–10.5)
nRBC: 0 % (ref 0.0–0.2)

## 2018-12-19 LAB — MAGNESIUM: Magnesium: 1.7 mg/dL (ref 1.7–2.4)

## 2018-12-19 MED ORDER — MAGNESIUM OXIDE 400 (241.3 MG) MG PO TABS: 400.0000 mg | ORAL_TABLET | Freq: Two times a day (BID) | ORAL | 5 refills | Status: AC

## 2018-12-19 MED ORDER — HEPARIN SOD (PORK) LOCK FLUSH 100 UNIT/ML IV SOLN
500.0000 [IU] | Freq: Once | INTRAVENOUS | Status: AC
  Administered 2018-12-19: 500 [IU]
  Filled 2018-12-19: qty 5

## 2018-12-19 MED ORDER — SODIUM CHLORIDE 0.9% FLUSH
10.0000 mL | Freq: Once | INTRAVENOUS | Status: AC
  Administered 2018-12-19: 10 mL
  Filled 2018-12-19: qty 10

## 2018-12-19 MED ORDER — MAGNESIUM OXIDE 400 (241.3 MG) MG PO TABS: 400.0000 mg | ORAL_TABLET | Freq: Two times a day (BID) | ORAL | 5 refills | Status: DC

## 2018-12-19 MED ORDER — TRAMADOL HCL 50 MG PO TABS: 50.0000 mg | ORAL_TABLET | Freq: Three times a day (TID) | ORAL | 1 refills | Status: AC | PRN

## 2018-12-19 MED ORDER — TRAMADOL HCL 50 MG PO TABS: 50.0000 mg | ORAL_TABLET | Freq: Three times a day (TID) | ORAL | 1 refills | Status: DC | PRN

## 2018-12-19 MED FILL — MAGNESIUM OXIDE 400 MG TAB: 400 (240 MG | 60 days supply | Qty: 120 | Fill #0

## 2018-12-19 MED FILL — traMADol HCL 50 MG TABS: 50 | 20 days supply | Qty: 60 | Fill #0

## 2018-12-19 NOTE — Telephone Encounter (Signed)
Per 5/20 los No change in appts. °

## 2018-12-19 NOTE — Telephone Encounter (Signed)
Oncology Nurse Navigator Documentation  Called Gloria Murray to check on her well-being.  She reported:  RT and chemotherapy going well.  With exception of thick saliva, denied concerns/issues.  No problem with LYFT rides.  She stated she does not have an appt for feeding tube placement, has not received call from [facility] to schedule.  Interventions:  I explained I had spoken with [facility] last week Thursday, they were to call her.  I encouraged her to call, she confirmed she has phone #.  I encouraged frequent salt water/baking soda rinses to help manage thick saliva. I encouraged her to call me as needed.  Gayleen Orem, RN, BSN Head & Neck Oncology Nurse [facility] [phone]

## 2018-12-19 NOTE — Progress Notes (Signed)
Per Dr. Maylon Peppers to give magnesium 4g IV with treatment on 12/20/2018. Order has been added to treatment plan.   Jalene Mullet, PharmD PGY2 Hematology/ Oncology Pharmacy Resident 12/19/2018 2:35 PM

## 2018-12-19 NOTE — Telephone Encounter (Signed)
Abigail Butts nurse at [facility] in Dr Audie Pinto office left v/m; Abigail Butts faxed over clearance form to [phone] and was going to ask Dr Dalbert Batman about pts need of an EKG. Then Abigail Butts saw note from [place] at [facility] on 12/06/18 that pt needed updated EKG before sign off on clearance. Abigail Butts is not sure who [facility] Hawking communicated with. Is this still true? Abigail Butts request cb.

## 2018-12-19 NOTE — Telephone Encounter (Signed)
Copied from [place] [id number]. Topic: General - Other >> Dec 18, 2018  4:16 PM Percell Belt A wrote: Reason for CRM:  Kennyth Lose with Dr Dalbert Batman office called in and stated that pt has to have a EKG before she can be sch for surgery.  They wanted to check the status on the surgical clearance and the ekg.  Please advise   Best number  [phone]

## 2018-12-19 NOTE — Progress Notes (Signed)
Provided 1 complementary case of Ensure Enlive. 

## 2018-12-19 NOTE — Telephone Encounter (Signed)
Called Dr Dalbert Batman office and they stated they were not sure if the EKG from 11/08/2018 will be okay and will the fax the form to my fax [phone]... also will call me back if the previous EKG will not suffice

## 2018-12-20 ENCOUNTER — Other Ambulatory Visit: Payer: Self-pay

## 2018-12-20 ENCOUNTER — Inpatient Hospital Stay: Payer: Medicare HMO

## 2018-12-20 ENCOUNTER — Ambulatory Visit
Admission: RE | Admit: 2018-12-20 | Discharge: 2018-12-20 | Disposition: A | Discharge status: Home / Self Care | Payer: Medicare HMO | Source: Ambulatory Visit | Attending: Radiation Oncology | Admitting: Radiation Oncology

## 2018-12-20 DIAGNOSIS — G893 Neoplasm related pain (acute) (chronic): Secondary | ICD-10-CM

## 2018-12-20 DIAGNOSIS — Z5111 Encounter for antineoplastic chemotherapy: Secondary | ICD-10-CM | POA: Diagnosis not present

## 2018-12-20 DIAGNOSIS — C321 Malignant neoplasm of supraglottis: Secondary | ICD-10-CM

## 2018-12-20 DIAGNOSIS — Z510 Encounter for antineoplastic radiation therapy: Secondary | ICD-10-CM | POA: Diagnosis not present

## 2018-12-20 DIAGNOSIS — Z79899 Other long term (current) drug therapy: Secondary | ICD-10-CM | POA: Diagnosis not present

## 2018-12-20 DIAGNOSIS — Z87891 Personal history of nicotine dependence: Secondary | ICD-10-CM | POA: Diagnosis not present

## 2018-12-20 DIAGNOSIS — E890 Postprocedural hypothyroidism: Secondary | ICD-10-CM | POA: Diagnosis not present

## 2018-12-20 DIAGNOSIS — I10 Essential (primary) hypertension: Secondary | ICD-10-CM | POA: Diagnosis not present

## 2018-12-20 DIAGNOSIS — Z7902 Long term (current) use of antithrombotics/antiplatelets: Secondary | ICD-10-CM | POA: Diagnosis not present

## 2018-12-20 DIAGNOSIS — C779 Secondary and unspecified malignant neoplasm of lymph node, unspecified: Secondary | ICD-10-CM | POA: Diagnosis not present

## 2018-12-20 DIAGNOSIS — Z79890 Hormone replacement therapy (postmenopausal): Secondary | ICD-10-CM | POA: Diagnosis not present

## 2018-12-20 DIAGNOSIS — Z951 Presence of aortocoronary bypass graft: Secondary | ICD-10-CM | POA: Diagnosis not present

## 2018-12-20 DIAGNOSIS — D701 Agranulocytosis secondary to cancer chemotherapy: Secondary | ICD-10-CM | POA: Diagnosis not present

## 2018-12-20 DIAGNOSIS — T451X5A Adverse effect of antineoplastic and immunosuppressive drugs, initial encounter: Secondary | ICD-10-CM | POA: Diagnosis not present

## 2018-12-20 DIAGNOSIS — I700 Atherosclerosis of aorta: Secondary | ICD-10-CM | POA: Diagnosis not present

## 2018-12-20 MED ORDER — PALONOSETRON HCL INJECTION 0.25 MG/5ML
0.2500 mg | Freq: Once | INTRAVENOUS | Status: AC
  Administered 2018-12-20: 10:00:00 0.25 mg via INTRAVENOUS

## 2018-12-20 MED ORDER — SODIUM CHLORIDE 0.9% FLUSH
10.0000 mL | INTRAVENOUS | Status: DC | PRN
  Administered 2018-12-20: 12:00:00 10 mL
  Filled 2018-12-20: qty 10

## 2018-12-20 MED ORDER — HEPARIN SOD (PORK) LOCK FLUSH 100 UNIT/ML IV SOLN
500.0000 [IU] | Freq: Once | INTRAVENOUS | Status: AC | PRN
  Administered 2018-12-20: 12:00:00 500 [IU]
  Filled 2018-12-20: qty 5

## 2018-12-20 MED ORDER — MAGNESIUM SULFATE 4 GM/100ML IV SOLN
4.0000 g | Freq: Once | INTRAVENOUS | Status: AC
  Administered 2018-12-20: 10:00:00 4 g via INTRAVENOUS
  Filled 2018-12-20: qty 100

## 2018-12-20 MED ORDER — SODIUM CHLORIDE 0.9 % IV SOLN
213.8000 mg | Freq: Once | INTRAVENOUS | Status: AC
  Administered 2018-12-20: 10:00:00 210 mg via INTRAVENOUS
  Filled 2018-12-20: qty 21

## 2018-12-20 MED ORDER — PALONOSETRON HCL INJECTION 0.25 MG/5ML
INTRAVENOUS | Status: AC
  Filled 2018-12-20: qty 5

## 2018-12-20 MED ORDER — SODIUM CHLORIDE 0.9 % IV SOLN
Freq: Once | INTRAVENOUS | Status: AC
  Administered 2018-12-20: 09:00:00 via INTRAVENOUS
  Filled 2018-12-20: qty 250

## 2018-12-20 MED ORDER — DEXAMETHASONE SODIUM PHOSPHATE 10 MG/ML IJ SOLN
INTRAMUSCULAR | Status: AC
  Filled 2018-12-20: qty 1

## 2018-12-20 MED ORDER — DEXAMETHASONE SODIUM PHOSPHATE 10 MG/ML IJ SOLN
10.0000 mg | Freq: Once | INTRAMUSCULAR | Status: AC
  Administered 2018-12-20: 10:00:00 10 mg via INTRAVENOUS

## 2018-12-20 MED ORDER — SODIUM CHLORIDE 0.9 % IV SOLN
INTRAVENOUS | Status: DC
  Administered 2018-12-20: 10:00:00 via INTRAVENOUS
  Filled 2018-12-20: qty 250

## 2018-12-20 NOTE — Patient Instructions (Signed)
[facility] Discharge Instructions for Patients Receiving Chemotherapy  Today you received the following chemotherapy agents Carboplatin  To help prevent nausea and vomiting after your treatment, we encourage you to take your nausea medication: As directed by your MD   If you develop nausea and vomiting that is not controlled by your nausea medication, call the clinic.   BELOW ARE SYMPTOMS THAT SHOULD BE REPORTED IMMEDIATELY:  *FEVER GREATER THAN 100.5 F  *CHILLS WITH OR WITHOUT FEVER  NAUSEA AND VOMITING THAT IS NOT CONTROLLED WITH YOUR NAUSEA MEDICATION  *UNUSUAL SHORTNESS OF BREATH  *UNUSUAL BRUISING OR BLEEDING  TENDERNESS IN MOUTH AND THROAT WITH OR WITHOUT PRESENCE OF ULCERS  *URINARY PROBLEMS  *BOWEL PROBLEMS  UNUSUAL RASH Items with * indicate a potential emergency and should be followed up as soon as possible.  Feel free to call the clinic should you have any questions or concerns. The clinic phone number is [phone].  Please show the [facility] at check-in to the Emergency Department and triage nurse.   Magnesium Sulfate injection What is this medicine? MAGNESIUM SULFATE (mag NEE zee um SUL fate) is an electrolyte injection commonly used to treat low magnesium levels in your blood. It is also used to prevent or control seizures in women with preeclampsia or eclampsia. This medicine may be used for other purposes; ask your health care provider or pharmacist if you have questions. What should I tell my health care provider before I take this medicine? They need to know if you have any of these conditions: -heart disease -history of irregular heart beat -kidney disease -an unusual or allergic reaction to magnesium sulfate, medicines, foods, dyes, or preservatives -pregnant or trying to get pregnant -breast-feeding How should I use this medicine? This medicine is for infusion into a vein. It is given by a health care professional in a  hospital or clinic setting. Talk to your pediatrician regarding the use of this medicine in children. While this drug may be prescribed for selected conditions, precautions do apply. Overdosage: If you think you have taken too much of this medicine contact a poison control center or emergency room at once. NOTE: This medicine is only for you. Do not share this medicine with others. What if I miss a dose? This does not apply. What may interact with this medicine? This medicine may interact with the following medications: -certain medicines for anxiety or sleep -certain medicines for seizures like phenobarbital -digoxin -medicines that relax muscles for surgery -narcotic medicines for pain This list may not describe all possible interactions. Give your health care provider a list of all the medicines, herbs, non-prescription drugs, or dietary supplements you use. Also tell them if you smoke, drink alcohol, or use illegal drugs. Some items may interact with your medicine. What should I watch for while using this medicine? Your condition will be monitored carefully while you are receiving this medicine. You may need blood work done while you are receiving this medicine. What side effects may I notice from receiving this medicine? Side effects that you should report to your doctor or health care professional as soon as possible: -allergic reactions like skin rash, itching or hives, swelling of the face, lips, or tongue -facial flushing -muscle weakness -signs and symptoms of low blood pressure like dizziness; feeling faint or lightheaded, falls; unusually weak or tired -signs and symptoms of a dangerous change in heartbeat or heart rhythm like chest pain; dizziness; fast or irregular heartbeat; palpitations; breathing problems -sweating  This list may not describe all possible side effects. Call your doctor for medical advice about side effects. You may report side effects to FDA at  1-800-FDA-1088. Where should I keep my medicine? This drug is given in a hospital or clinic and will not be stored at home. NOTE: This sheet is a summary. It may not cover all possible information. If you have questions about this medicine, talk to your doctor, pharmacist, or health care provider.  2019 Elsevier/Gold Standard (2016-02-03 12:31:42)   Coronavirus (COVID-19) Are you at risk?  Are you at risk for the Coronavirus (COVID-19)?  To be considered HIGH RISK for Coronavirus (COVID-19), you have to meet the following criteria:  . Traveled to [place], [place], [place], [place] or [place]; or in the [place] to [place], [place], [place], or [place]; and have fever, cough, and shortness of breath within the last 2 weeks of travel OR . Been in close contact with a person diagnosed with COVID-19 within the last 2 weeks and have fever, cough, and shortness of breath . IF YOU DO NOT MEET THESE CRITERIA, YOU ARE CONSIDERED LOW RISK FOR COVID-19.  What to do if you are HIGH RISK for COVID-19?  Marland Kitchen If you are having a medical emergency, call 911. . Seek medical care right away. Before you go to a doctor's office, urgent care or emergency department, call ahead and tell them about your recent travel, contact with someone diagnosed with COVID-19, and your symptoms. You should receive instructions from your physician's office regarding next steps of care.  . When you arrive at healthcare provider, tell the healthcare staff immediately you have returned from visiting [place], [place], [place], [place] or [place]; or traveled in the [place] to [place], [employer], [place], or [place]; in the last two weeks or you have been in close contact with a person diagnosed with COVID-19 in the last 2 weeks.   . Tell the health care staff about your symptoms: fever, cough and shortness of breath. . After you have been seen by a medical provider, you will be either: o Tested for (COVID-19) and  discharged home on quarantine except to seek medical care if symptoms worsen, and asked to  - Stay home and avoid contact with others until you get your results (4-5 days)  - Avoid travel on public transportation if possible (such as bus, train, or airplane) or o Sent to the Emergency Department by EMS for evaluation, COVID-19 testing, and possible admission depending on your condition and test results.  What to do if you are LOW RISK for COVID-19?  Reduce your risk of any infection by using the same precautions used for avoiding the common cold or flu:  Marland Kitchen Wash your hands often with soap and warm water for at least 20 seconds.  If soap and water are not readily available, use an alcohol-based hand sanitizer with at least 60% alcohol.  . If coughing or sneezing, cover your mouth and nose by coughing or sneezing into the elbow areas of your shirt or coat, into a tissue or into your sleeve (not your hands). . Avoid shaking hands with others and consider head nods or verbal greetings only. . Avoid touching your eyes, nose, or mouth with unwashed hands.  . Avoid close contact with people who are sick. . Avoid places or events with large numbers of people in one location, like concerts or sporting events. . Carefully consider travel plans you have or are  making. . If you are planning any travel outside or inside the [place], visit the CDC's Travelers' Health webpage for the latest health notices. . If you have some symptoms but not all symptoms, continue to monitor at home and seek medical attention if your symptoms worsen. . If you are having a medical emergency, call 911.   [place] / e-Visit: [email or web address]         [facility]: [facility]: [phone]                   [facility]: [phone]

## 2018-12-21 ENCOUNTER — Ambulatory Visit
Admission: RE | Admit: 2018-12-21 | Discharge: 2018-12-21 | Disposition: A | Discharge status: Home / Self Care | Payer: Medicare HMO | Source: Ambulatory Visit | Attending: Radiation Oncology | Admitting: Radiation Oncology

## 2018-12-21 ENCOUNTER — Telehealth: Payer: Self-pay | Admitting: *Deleted

## 2018-12-21 ENCOUNTER — Other Ambulatory Visit: Payer: Self-pay

## 2018-12-21 DIAGNOSIS — I10 Essential (primary) hypertension: Secondary | ICD-10-CM | POA: Diagnosis not present

## 2018-12-21 DIAGNOSIS — C321 Malignant neoplasm of supraglottis: Secondary | ICD-10-CM | POA: Diagnosis not present

## 2018-12-21 DIAGNOSIS — Z951 Presence of aortocoronary bypass graft: Secondary | ICD-10-CM | POA: Diagnosis not present

## 2018-12-21 DIAGNOSIS — Z79890 Hormone replacement therapy (postmenopausal): Secondary | ICD-10-CM | POA: Diagnosis not present

## 2018-12-21 DIAGNOSIS — Z510 Encounter for antineoplastic radiation therapy: Secondary | ICD-10-CM | POA: Diagnosis not present

## 2018-12-21 DIAGNOSIS — E890 Postprocedural hypothyroidism: Secondary | ICD-10-CM | POA: Diagnosis not present

## 2018-12-21 DIAGNOSIS — Z79899 Other long term (current) drug therapy: Secondary | ICD-10-CM | POA: Diagnosis not present

## 2018-12-21 DIAGNOSIS — Z7902 Long term (current) use of antithrombotics/antiplatelets: Secondary | ICD-10-CM | POA: Diagnosis not present

## 2018-12-21 DIAGNOSIS — Z87891 Personal history of nicotine dependence: Secondary | ICD-10-CM | POA: Diagnosis not present

## 2018-12-22 NOTE — Telephone Encounter (Signed)
Oncology Nurse Navigator Documentation  Called Ms. Cater to check on her well-being since starting RT and chemotherapy last week, to check on status of g-tube placement with [facility].  She denied and concerns/issues with treatments thus far.  She noted saliva is thick and mouth dry.  I encouraged frequent salt water/baking soda rinses to help manage thick saliva, frequent sips of water.  She stated she has not scheduled g-tube placement yet b/c friend who is going to spend HS with her post-placement has been sick.  She indicated she will schedule ASAP when friend is better.  I encouraged her to call me when placement scheduled.  She denied issues with [facility] transportation. I encouraged her to call me with questions/concerns.    Gayleen Orem, RN, BSN Head & Neck Oncology Nurse [place] [facility] [place] [phone]

## 2018-12-25 ENCOUNTER — Other Ambulatory Visit: Payer: Self-pay

## 2018-12-25 ENCOUNTER — Ambulatory Visit
Admission: RE | Admit: 2018-12-25 | Discharge: 2018-12-25 | Disposition: A | Discharge status: Home / Self Care | Payer: Medicare HMO | Source: Ambulatory Visit | Attending: Radiation Oncology | Admitting: Radiation Oncology

## 2018-12-25 ENCOUNTER — Inpatient Hospital Stay: Payer: Medicare HMO | Admitting: Nutrition

## 2018-12-25 DIAGNOSIS — Z79899 Other long term (current) drug therapy: Secondary | ICD-10-CM | POA: Diagnosis not present

## 2018-12-25 DIAGNOSIS — T451X5A Adverse effect of antineoplastic and immunosuppressive drugs, initial encounter: Secondary | ICD-10-CM | POA: Diagnosis not present

## 2018-12-25 DIAGNOSIS — Z7982 Long term (current) use of aspirin: Secondary | ICD-10-CM | POA: Diagnosis not present

## 2018-12-25 DIAGNOSIS — D701 Agranulocytosis secondary to cancer chemotherapy: Secondary | ICD-10-CM | POA: Diagnosis not present

## 2018-12-25 DIAGNOSIS — Z79890 Hormone replacement therapy (postmenopausal): Secondary | ICD-10-CM | POA: Diagnosis not present

## 2018-12-25 DIAGNOSIS — E890 Postprocedural hypothyroidism: Secondary | ICD-10-CM | POA: Diagnosis not present

## 2018-12-25 DIAGNOSIS — I700 Atherosclerosis of aorta: Secondary | ICD-10-CM | POA: Diagnosis not present

## 2018-12-25 DIAGNOSIS — Z7902 Long term (current) use of antithrombotics/antiplatelets: Secondary | ICD-10-CM | POA: Diagnosis not present

## 2018-12-25 DIAGNOSIS — Z510 Encounter for antineoplastic radiation therapy: Secondary | ICD-10-CM | POA: Diagnosis not present

## 2018-12-25 DIAGNOSIS — I10 Essential (primary) hypertension: Secondary | ICD-10-CM | POA: Diagnosis not present

## 2018-12-25 DIAGNOSIS — Z87891 Personal history of nicotine dependence: Secondary | ICD-10-CM | POA: Diagnosis not present

## 2018-12-25 DIAGNOSIS — C321 Malignant neoplasm of supraglottis: Secondary | ICD-10-CM | POA: Diagnosis not present

## 2018-12-25 DIAGNOSIS — G893 Neoplasm related pain (acute) (chronic): Secondary | ICD-10-CM | POA: Diagnosis not present

## 2018-12-25 DIAGNOSIS — Z951 Presence of aortocoronary bypass graft: Secondary | ICD-10-CM | POA: Diagnosis not present

## 2018-12-25 DIAGNOSIS — Z5111 Encounter for antineoplastic chemotherapy: Secondary | ICD-10-CM | POA: Diagnosis not present

## 2018-12-25 DIAGNOSIS — C779 Secondary and unspecified malignant neoplasm of lymph node, unspecified: Secondary | ICD-10-CM | POA: Diagnosis not present

## 2018-12-25 NOTE — Progress Notes (Signed)
[facility] OFFICE PROGRESS NOTE  Patient Care Team: Jearld Fenton, NP as PCP - General (Internal Medicine) Irene Shipper, MD as Consulting Physician (Gastroenterology) Early, Arvilla Meres, MD as Consulting Physician (Vascular Surgery) Serafina Mitchell, MD as Consulting Physician (Vascular Surgery) Eppie Gibson, MD as Attending Physician (Radiation Oncology) Leota Sauers, RN as Oncology Nurse Navigator (Oncology) Karie Mainland, RD as Dietitian (Nutrition) Sharen Counter, CCC-SLP as Speech Language Pathologist (Speech Pathology) Kennith Center, LCSW as Social Worker  HEME/ONC OVERVIEW: 1. 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 R Level II LN 5 x 3.3cm; L Level II LN 5.4 x 2.7cm w/ suspicion for ECE) -10/2018: DL with bx by Dr. Redmond Baseman showed a mass involving the left aryepiglottic fold extending up to the epiglottic and BOT, bx showed invasive squamous cell carcinoma; PET showed FDG-avid supraglottic malignancy and bilateral bulky FDG-avid Level II and III cervical LN's, no mets  -Late 10/2018 - present: definitive chemoradiation with weekly carboplatin   2. 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 -S/p 3 cycles of weekly carboplatin concurrent with RT in the definitive setting -Labs adequate today, proceed with Cycle 4 of chemotherapy -We have had multiple discussions regarding feeding tube; due to lack of social support, patient does not have anyone to stay with her after feeding tube placement per surgery requirement, and therefore feeding tube has been on hold -If she develops any difficulty with PO intake or  dehydration, we will have to re-visit the feeding tube, possibly by IR  -PRN anti-emetics: Zofran, Compazine, Ativan and dexamethasone  Chemotherapy-associated leukopenia -Secondary to chemotherapy -WBC 3.7k with ANC 2900, stable -Patient denies any symptoms of infection -We will monitor for now; no indication for dose adjustment -If leukopenia worsens in the future, we will consider delaying chemotherapy or adjusting chemotherapy dose  Chemotherapy-associated nausea  -Secondary to chemotherapy -Symptoms relatively well controlled  -Continue PRN-anti-emetics   Protein malnutrition -Secondary to chemotherapy vs. chemoradiation -Weight down by 8 lbs since the last visit; she reports eating solids without difficulty but is currently drinking one to one and a half bottles of 16 oz water per day -No feeding tube due to logistical difficulties and lack of social support -I counseled the patient on the importance of maintaining adequate nutrition and hydration, given the progressive weight loss, including Boosts/Ensure/Carnation and 4-6 bottles of 16 oz water per day   -If her weight continues to drop precipitously, then we will have to revisit the feeding tube  -Meanwhile, I have ordered IV fluid support (1L NS) for the reminder of the week and next Monday, Tuesday  -We will re-assess her oral intake next week to see if she requires additional IV fluid support   No orders of the defined types were placed in this encounter.  All questions were answered. The patient knows to call the clinic with any problems, questions or concerns. No barriers to learning was detected.  Return in 1 week for port flush, labs, clinic appointment, and Cycle 5 of weekly carboplatin.  Tish Men, MD 01/02/2019 12:26 PM  CHIEF COMPLAINT: "I am doing okay so far"  INTERVAL HISTORY: Ms. Helene Kelp returns to clinic for follow-up of squamous  cell carcinoma of the supraglottic larynx on definitive chemoradiation.  Patient  reports that she had one episode of nausea with emesis, which consisted mostly of phlegm, and she took 1 dose of the anti-medic with resolution of the symptom.  She has not had any nausea or vomiting prior to today.  She is able to eat most foods without any difficulty, including sandwiches and soup, but he is only drinking 1 to 1-1/2 bottles of 16 ounce water per day due to early satiety.  She took 1 dose of tramadol for headache, which helped, but she has not had to take any tramadol for pain with swallowing.  Her neck swelling is overall improving.  She is doing salt/soda rinses about twice a day.  She denies any other complaint today.  SUMMARY OF ONCOLOGIC HISTORY:   Malignant neoplasm of supraglottis ([place])   10/26/2018 Imaging    CT neck w/ contrast: IMPRESSION: 1. Bulky, partially necrotic bilateral cervical lymphadenopathy most concerning for metastatic disease, less likely unusual infection. 2. Prominent soft tissue at the tongue base with asymmetric left aryepiglottic fold thickening and slight fullness of the left palatine tonsil. Direct visualization is recommended to assess for a primary mucosal malignancy. 3.  Aortic Atherosclerosis (ICD10-I70.0).    11/08/2018 Procedure    DL with biopsy    11/08/2018 Pathology Results    Accession: [id number] 6. Larynx, biopsy, Left Supraglottis - INVASIVE SQUAMOUS CELL CARCINOMA. 2. Larynx, biopsy, Left Ary Epiglottic Fold - INVASIVE SQUAMOUS CELL CARCINOMA. 3. Tongue, biopsy, Left Vallecular Base - INVASIVE SQUAMOUS CELL CARCINOMA. 4. Larynx, biopsy, Right Vallecular Epiglottis - INVASIVE SQUAMOUS CELL CARCINOMA.    11/16/2018 Initial Diagnosis    Laryngeal cancer ([place])    11/23/2018 Imaging    PET: IMPRESSION: 1. Intensely hypermetabolic supraglottic mass predominantly involving the LEFT aryepiglottic fold but extending across midline anteriorly. Inferiorly hypermetabolic activity extends to the level of the larynx on the LEFT. 2.  Bilateral bulky intensely hypermetabolic metastatic level II lymph nodes. Metastatic adenopathy extends with smaller nodes in the level III nodal stations bilaterally. 3. No evidence of thoracic metastasis. 4. Enlarged LEFT adrenal gland consistent with hyperplasia or lipid poor adenoma.    11/27/2018 Cancer Staging    Staging form: Larynx - Supraglottis, AJCC 8th Edition - Clinical stage from 11/27/2018: Stage IVB (cT2, cN3b, cM0) - Signed by Eppie Gibson, MD on 11/27/2018    12/13/2018 -  Chemotherapy    The patient had palonosetron (ALOXI) injection 0.25 mg, 0.25 mg, Intravenous,  Once, 3 of 7 cycles Administration: 0.25 mg (12/13/2018), 0.25 mg (12/20/2018), 0.25 mg (12/27/2018) CARBOplatin (PARAPLATIN) 210 mg in sodium chloride 0.9 % 250 mL chemo infusion, 210 mg (100 % of original dose 213.8 mg), Intravenous,  Once, 3 of 7 cycles Dose modification:   (original dose 213.8 mg, Cycle 1) Administration: 210 mg (12/13/2018), 210 mg (12/20/2018), 210 mg (12/27/2018)  for chemotherapy treatment.      REVIEW OF SYSTEMS:   Constitutional: ( - ) fevers, ( - )  chills , ( - ) night sweats Eyes: ( - ) blurriness of vision, ( - ) double vision, ( - ) watery eyes Ears, nose, mouth, throat, and face: ( - ) mucositis, ( - ) sore throat Respiratory: ( - ) cough, ( - ) dyspnea, ( - ) wheezes Cardiovascular: ( - ) palpitation, ( - ) chest discomfort, ( - ) lower extremity swelling Gastrointestinal:  ( + ) nausea, ( - ) heartburn, ( - ) change in bowel habits  Skin: ( - ) abnormal skin rashes Lymphatics: ( - ) new lymphadenopathy, ( - ) easy bruising Neurological: ( - ) numbness, ( - ) tingling, ( - ) new weaknesses Behavioral/Psych: ( - ) mood change, ( - ) new changes  All other systems were reviewed with the patient and are negative.  I have reviewed the past medical history, past surgical history, social history and family history with the patient and they are unchanged from previous  note.  ALLERGIES:  is allergic to propoxyphene hcl and propoxyphene n-acetaminophen.  MEDICATIONS:  Current Outpatient Medications  Medication Sig Dispense Refill  . Ascorbic Acid (VITAMIN C PO) Take 1 tablet by mouth daily.    Marland Kitchen aspirin EC 81 MG tablet Take 81 mg by mouth daily.    Marland Kitchen CALCIUM PO Take 1 tablet by mouth daily.    . Cholecalciferol (VITAMIN D3 PO) Take 1 tablet by mouth daily.    . clopidogrel (PLAVIX) 75 MG tablet TAKE 1 TABLET (75 MG TOTAL) BY MOUTH DAILY. 90 tablet 3  . dexamethasone (DECADRON) 4 MG tablet Take 2 tablets (8 mg total) by mouth daily. Start the day after chemotherapy for 2 days. Take with food. (Patient not taking: Reported on 12/26/2018) 30 tablet 1  . fexofenadine (ALLEGRA) 180 MG tablet Take 180 mg by mouth at bedtime.     . fluticasone (FLONASE) 50 MCG/ACT nasal spray Place 1 spray into both nostrils daily.     . Garlic 1751 MG CAPS Take 1 capsule by mouth 2 (two) times daily.      . hydrochlorothiazide (MICROZIDE) 12.5 MG capsule TAKE 1 CAPSULE EVERY DAY 90 capsule 0  . levothyroxine (SYNTHROID) 112 MCG tablet TAKE 1 TABLET EVERY DAY 90 tablet 0  . lidocaine (XYLOCAINE) 2 % solution Patient: Mix 1part 2% viscous lidocaine, 1part H20. Swish & swallow 17mL of diluted mixture, 56min before meals and at bedtime, up to QID 100 mL 5  . lidocaine-prilocaine (EMLA) cream Apply to affected area once 30 g 3  . LORazepam (ATIVAN) 0.5 MG tablet Take 1 tablet (0.5 mg total) by mouth every 6 (six) hours as needed (Nausea or vomiting). 30 tablet 0  . magnesium oxide (MAG-OX) 400 (241.3 Mg) MG tablet Take 1 tablet (400 mg total) by mouth 2 (two) times daily for 30 days. Please mail to the patient 60 tablet 5  . metoprolol succinate (TOPROL-XL) 25 MG 24 hr tablet TAKE 1 TABLET EVERY DAY 90 tablet 0  . Omega-3 Fatty Acids (FISH OIL) 1000 MG CAPS Take 1 capsule (1,000 mg total) by mouth daily.    . ondansetron (ZOFRAN) 8 MG tablet Take 1 tablet (8 mg total) by mouth 2 (two)  times daily as needed for refractory nausea / vomiting. Start on day 3 after chemotherapy. 30 tablet 1  . prochlorperazine (COMPAZINE) 10 MG tablet Take 1 tablet (10 mg total) by mouth every 6 (six) hours as needed (Nausea or vomiting). 30 tablet 1  . simvastatin (ZOCOR) 40 MG tablet TAKE 1 TABLET AT BEDTIME 90 tablet 0  . sodium fluoride (PREVIDENT 5000 PLUS) 1.1 % CREA dental cream Apply cream to tooth brush. Brush teeth for 2 minutes. Spit out excess. DO NOT rinse afterwards. Repeat nightly. 1 Tube prn  . traMADol (ULTRAM) 50 MG tablet Take 1 tablet (50 mg total) by mouth every 8 (eight) hours as needed. Mail to the patient please. Patient has [place] of $700.00. 60 tablet 1  . VITAMIN E PO Take 1 capsule  by mouth daily.     No current facility-administered medications for this visit.     PHYSICAL EXAMINATION: ECOG PERFORMANCE STATUS: 2 - Symptomatic, <50% confined to bed  Today's Vitals   01/02/19 1156 01/02/19 1158  BP:  119/84  Pulse:  (!) 117  Resp:  16  Temp:  98.2 F (36.8 C)  TempSrc:  Oral  SpO2:  90%  Weight:  197 lb 1.6 oz (89.4 kg)  Height:  5\' 5"  (1.651 m)  PainSc: 0-No pain    Body mass index is 32.8 kg/m.  Filed Weights   01/02/19 1158  Weight: 197 lb 1.6 oz (89.4 kg)    GENERAL: alert, no distress and comfortable, slightly disheveled  SKIN: skin color, texture, turgor are normal, no rashes or significant lesions EYES: conjunctiva are pink and non-injected, sclera clear OROPHARYNX: no exudate, no erythema; lips, buccal mucosa, and tongue normal  NECK: supple, non-tender LYMPH:  Bulky right cervical adenopathy, improving LUNGS: clear to auscultation with normal breathing effort HEART: regular rate & rhythm and no murmurs and no lower extremity edema ABDOMEN: soft, non-tender, non-distended, normal bowel sounds Musculoskeletal: no cyanosis of digits and no clubbing  PSYCH: alert & oriented x 3, fluent speech NEURO: no focal motor/sensory  deficits  LABORATORY DATA:  I have reviewed the data as listed    Component Value Date/Time   NA 141 12/26/2018 1039   K 4.3 12/26/2018 1039   CL 102 12/26/2018 1039   CO2 30 12/26/2018 1039   GLUCOSE 118 (H) 12/26/2018 1039   BUN 11 12/26/2018 1039   CREATININE 0.78 12/26/2018 1039   CREATININE 0.89 10/06/2017 1504   CALCIUM 9.9 12/26/2018 1039   PROT 7.2 11/21/2018 1251   ALBUMIN 4.1 11/21/2018 1251   AST 15 11/21/2018 1251   ALT 17 11/21/2018 1251   ALKPHOS 67 11/21/2018 1251   BILITOT 0.4 11/21/2018 1251   GFRNONAA >60 12/26/2018 1039   GFRAA >60 12/26/2018 1039    No results found for: SPEP, UPEP  Lab Results  Component Value Date   WBC 3.7 (L) 01/02/2019   NEUTROABS 2.9 01/02/2019   HGB 14.4 01/02/2019   HCT 43.4 01/02/2019   MCV 93.1 01/02/2019   PLT 191 01/02/2019      Chemistry      Component Value Date/Time   NA 141 12/26/2018 1039   K 4.3 12/26/2018 1039   CL 102 12/26/2018 1039   CO2 30 12/26/2018 1039   BUN 11 12/26/2018 1039   CREATININE 0.78 12/26/2018 1039   CREATININE 0.89 10/06/2017 1504      Component Value Date/Time   CALCIUM 9.9 12/26/2018 1039   ALKPHOS 67 11/21/2018 1251   AST 15 11/21/2018 1251   ALT 17 11/21/2018 1251   BILITOT 0.4 11/21/2018 1251       RADIOGRAPHIC STUDIES: I have personally reviewed the radiological images as listed below and agreed with the findings in the report. Ir Imaging Guided Port Insertion  Result Date: 12/06/2018 INDICATION: 68 year old female with history of laryngeal cancer. She requires durable venous access for chemotherapy. EXAM: IMPLANTED PORT A CATH PLACEMENT WITH ULTRASOUND AND FLUOROSCOPIC GUIDANCE MEDICATIONS: 2 g Ancef; The antibiotic was administered within an appropriate time interval prior to skin puncture. ANESTHESIA/SEDATION: Versed 4 mg IV; Fentanyl 100 mcg IV; Moderate Sedation Time:  22 minutes The patient was continuously monitored during the procedure by the interventional radiology  nurse under my direct supervision. FLUOROSCOPY TIME:  0 minutes, 42 seconds (11 mGy) COMPLICATIONS: None immediate.  PROCEDURE: The right neck and chest was prepped with chlorhexidine, and draped in the usual sterile fashion using maximum barrier technique (cap and mask, sterile gown, sterile gloves, large sterile sheet, hand hygiene and cutaneous antiseptic). Local anesthesia was attained by infiltration with 1% lidocaine with epinephrine. Ultrasound demonstrated patency of the right internal jugular vein, and this was documented with an image. Under real-time ultrasound guidance, this vein was accessed with a 21 gauge micropuncture needle and image documentation was performed. A small dermatotomy was made at the access site with an 11 scalpel. A 0.018" wire was advanced into the SVC and the access needle exchanged for a 71F micropuncture vascular sheath. The 0.018" wire was then removed and a 0.035" wire advanced into the IVC. An appropriate location for the subcutaneous reservoir was selected below the clavicle and an incision was made through the skin and underlying soft tissues. The subcutaneous tissues were then dissected using a combination of blunt and sharp surgical technique and a pocket was formed. A single lumen power injectable portacatheter was then tunneled through the subcutaneous tissues from the pocket to the dermatotomy and the port reservoir placed within the subcutaneous pocket. The venous access site was then serially dilated and a peel away vascular sheath placed over the wire. The wire was removed and the port catheter advanced into position under fluoroscopic guidance. The catheter tip is positioned in the superior cavoatrial junction. This was documented with a spot image. The portacatheter was then tested and found to flush and aspirate well. The port was flushed with saline followed by 100 units/mL heparinized saline. The pocket was then closed in two layers using first subdermal inverted  interrupted absorbable sutures followed by a running subcuticular suture. The epidermis was then sealed with Dermabond. The dermatotomy at the venous access site was also closed with Dermabond. IMPRESSION: Successful placement of a right IJ approach Power Port with ultrasound and fluoroscopic guidance. The catheter is ready for use. Electronically Signed   By: Jacqulynn Cadet M.D.   On: 12/06/2018 15:31

## 2018-12-25 NOTE — Progress Notes (Signed)
[facility] OFFICE PROGRESS NOTE  Patient Care Team: Jearld Fenton, NP as PCP - General (Internal Medicine) Irene Shipper, MD as Consulting Physician (Gastroenterology) Early, Arvilla Meres, MD as Consulting Physician (Vascular Surgery) Serafina Mitchell, MD as Consulting Physician (Vascular Surgery) Eppie Gibson, MD as Attending Physician (Radiation Oncology) Leota Sauers, RN as Oncology Nurse Navigator (Oncology) Karie Mainland, RD as Dietitian (Nutrition) Sharen Counter, CCC-SLP as Speech Language Pathologist (Speech Pathology) Kennith Center, LCSW as Social Worker  HEME/ONC OVERVIEW: 1. 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 R Level II LN 5 x 3.3cm; L Level II LN 5.4 x 2.7cm w/ suspicion for ECE) -10/2018: DL with bx by Dr. Redmond Baseman showed a mass involving the left aryepiglottic fold extending up to the epiglottic and BOT, bx showed invasive squamous cell carcinoma; PET showed FDG-avid supraglottic malignancy and bilateral bulky FDG-avid Level II and III cervical LN's, no mets  -Late 10/2018 - present: definitive chemoradiation with weekly carboplatin   2. 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 -S/p 2 cycles of weekly carboplatin concurrent with RT in the definitive setting -Labs adequate today, proceed with Cycle 3 of chemotherapy -We have had multiple discussions regarding feeding tube; due to lack of social support, patient does not have anyone to stay with her after feeding tube placement per surgery requirement, and therefore feeding tube has been on hold -If she develops any difficulty with PO intake, progressive  weight loss or dehydration, we will have to re-visit the feeding tube, possibly by IR  -PRN anti-emetics: Zofran, Compazine, Ativan and dexamethasone  Cancer-related pain -Secondary to laryngeal cancer and jaw pain from dental manipulation -Currently on tramadol 50mg  q8hrs PRN for pain -We will monitor it closely and adjust the pain regimen as needed   Chemotherapy-associated leukopenia -Secondary to chemotherapy -WBC 3.9k with ANC 3000, slightly lower than last week  -Patient denies any symptoms of infection -We will monitor for now; no indication for dose adjustment -If leukopenia worsens in the future, we will consider delaying chemotherapy or adjusting chemotherapy dose  Intermittent coughing -Likely due to underlying COPD -No suspicious symptoms for infection, such as chest pain, dyspnea, change in sputum color/quantity, or fever -We will monitor it for now   No orders of the defined types were placed in this encounter.  All questions were answered. The patient knows to call the clinic with any problems, questions or concerns. No barriers to learning was detected.  Return in 1 week for labs, port flush, clinic appt and Cycle 4 of weekly carboplatin.   Tish Men, MD 12/26/2018 12:11 PM  CHIEF COMPLAINT: "I am doing okay so far"  INTERVAL HISTORY: Ms. Helene Kelp returns to clinic for follow-up of squamous cell carcinoma of the supraglottic larynx on definitive chemoradiation with carboplatin.  Patient reports that she has mild sore throat, for which she has not yet had to take any pain medication.,  The throat was usually triggered by periodic coughing, associated with scant clear sputum production, but she denies any fever, chest pain, dyspnea, change in sputum color/quantity, or hemoptysis.  She is still able to eat and drink without any difficulty.  Her weight has been overall stable.  She  denies any complaint today.  SUMMARY OF ONCOLOGIC HISTORY:   Malignant neoplasm of  supraglottis ([place])   10/26/2018 Imaging    CT neck w/ contrast: IMPRESSION: 1. Bulky, partially necrotic bilateral cervical lymphadenopathy most concerning for metastatic disease, less likely unusual infection. 2. Prominent soft tissue at the tongue base with asymmetric left aryepiglottic fold thickening and slight fullness of the left palatine tonsil. Direct visualization is recommended to assess for a primary mucosal malignancy. 3.  Aortic Atherosclerosis (ICD10-I70.0).    11/08/2018 Procedure    DL with biopsy    11/08/2018 Pathology Results    Accession: [id number] 0. Larynx, biopsy, Left Supraglottis - INVASIVE SQUAMOUS CELL CARCINOMA. 2. Larynx, biopsy, Left Ary Epiglottic Fold - INVASIVE SQUAMOUS CELL CARCINOMA. 3. Tongue, biopsy, Left Vallecular Base - INVASIVE SQUAMOUS CELL CARCINOMA. 4. Larynx, biopsy, Right Vallecular Epiglottis - INVASIVE SQUAMOUS CELL CARCINOMA.    11/16/2018 Initial Diagnosis    Laryngeal cancer ([place])    11/23/2018 Imaging    PET: IMPRESSION: 1. Intensely hypermetabolic supraglottic mass predominantly involving the LEFT aryepiglottic fold but extending across midline anteriorly. Inferiorly hypermetabolic activity extends to the level of the larynx on the LEFT. 2. Bilateral bulky intensely hypermetabolic metastatic level II lymph nodes. Metastatic adenopathy extends with smaller nodes in the level III nodal stations bilaterally. 3. No evidence of thoracic metastasis. 4. Enlarged LEFT adrenal gland consistent with hyperplasia or lipid poor adenoma.    11/27/2018 Cancer Staging    Staging form: Larynx - Supraglottis, AJCC 8th Edition - Clinical stage from 11/27/2018: Stage IVB (cT2, cN3b, cM0) - Signed by Eppie Gibson, MD on 11/27/2018    12/13/2018 -  Chemotherapy    The patient had palonosetron (ALOXI) injection 0.25 mg, 0.25 mg, Intravenous,  Once, 2 of 7 cycles Administration: 0.25 mg (12/13/2018), 0.25 mg (12/20/2018) CARBOplatin (PARAPLATIN) 210  mg in sodium chloride 0.9 % 250 mL chemo infusion, 210 mg (100 % of original dose 213.8 mg), Intravenous,  Once, 2 of 7 cycles Dose modification:   (original dose 213.8 mg, Cycle 1) Administration: 210 mg (12/13/2018), 210 mg (12/20/2018)  for chemotherapy treatment.      REVIEW OF SYSTEMS:   Constitutional: ( - ) fevers, ( - )  chills , ( - ) night sweats Eyes: ( - ) blurriness of vision, ( - ) double vision, ( - ) watery eyes Ears, nose, mouth, throat, and face: ( - ) mucositis, ( + ) sore throat Respiratory: ( - ) cough, ( - ) dyspnea, ( - ) wheezes Cardiovascular: ( - ) palpitation, ( - ) chest discomfort, ( - ) lower extremity swelling Gastrointestinal:  ( - ) nausea, ( - ) heartburn, ( - ) change in bowel habits Skin: ( - ) abnormal skin rashes Lymphatics: ( - ) new lymphadenopathy, ( - ) easy bruising Neurological: ( - ) numbness, ( - ) tingling, ( - ) new weaknesses Behavioral/Psych: ( - ) mood change, ( - ) new changes  All other systems were reviewed with the patient and are negative.  I have reviewed the past medical history, past surgical history, social history and family history with the patient and they are unchanged from previous note.  ALLERGIES:  is allergic to propoxyphene hcl and propoxyphene n-acetaminophen.  MEDICATIONS:  Current Outpatient Medications  Medication Sig Dispense Refill  . Ascorbic Acid (VITAMIN C PO) Take 1 tablet by mouth daily.    Marland Kitchen aspirin EC 81 MG tablet Take 81 mg by mouth daily.    Marland Kitchen  CALCIUM PO Take 1 tablet by mouth daily.    . Cholecalciferol (VITAMIN D3 PO) Take 1 tablet by mouth daily.    . clopidogrel (PLAVIX) 75 MG tablet TAKE 1 TABLET (75 MG TOTAL) BY MOUTH DAILY. 90 tablet 3  . fexofenadine (ALLEGRA) 180 MG tablet Take 180 mg by mouth at bedtime.     . fluticasone (FLONASE) 50 MCG/ACT nasal spray Place 1 spray into both nostrils daily.     . Garlic 5621 MG CAPS Take 1 capsule by mouth 2 (two) times daily.      . hydrochlorothiazide  (MICROZIDE) 12.5 MG capsule TAKE 1 CAPSULE EVERY DAY 90 capsule 0  . levothyroxine (SYNTHROID) 112 MCG tablet TAKE 1 TABLET EVERY DAY 90 tablet 0  . lidocaine (XYLOCAINE) 2 % solution Patient: Mix 1part 2% viscous lidocaine, 1part H20. Swish & swallow 54mL of diluted mixture, 64min before meals and at bedtime, up to QID 100 mL 5  . lidocaine-prilocaine (EMLA) cream Apply to affected area once 30 g 3  . LORazepam (ATIVAN) 0.5 MG tablet Take 1 tablet (0.5 mg total) by mouth every 6 (six) hours as needed (Nausea or vomiting). 30 tablet 0  . magnesium oxide (MAG-OX) 400 (241.3 Mg) MG tablet Take 1 tablet (400 mg total) by mouth 2 (two) times daily for 30 days. Please mail to the patient 60 tablet 5  . metoprolol succinate (TOPROL-XL) 25 MG 24 hr tablet TAKE 1 TABLET EVERY DAY 90 tablet 0  . Omega-3 Fatty Acids (FISH OIL) 1000 MG CAPS Take 1 capsule (1,000 mg total) by mouth daily.    . ondansetron (ZOFRAN) 8 MG tablet Take 1 tablet (8 mg total) by mouth 2 (two) times daily as needed for refractory nausea / vomiting. Start on day 3 after chemotherapy. 30 tablet 1  . prochlorperazine (COMPAZINE) 10 MG tablet Take 1 tablet (10 mg total) by mouth every 6 (six) hours as needed (Nausea or vomiting). 30 tablet 1  . simvastatin (ZOCOR) 40 MG tablet TAKE 1 TABLET AT BEDTIME 90 tablet 0  . sodium fluoride (PREVIDENT 5000 PLUS) 1.1 % CREA dental cream Apply cream to tooth brush. Brush teeth for 2 minutes. Spit out excess. DO NOT rinse afterwards. Repeat nightly. 1 Tube prn  . traMADol (ULTRAM) 50 MG tablet Take 1 tablet (50 mg total) by mouth every 8 (eight) hours as needed. Mail to the patient please. Patient has [place] of $700.00. 60 tablet 1  . VITAMIN E PO Take 1 capsule by mouth daily.    Marland Kitchen dexamethasone (DECADRON) 4 MG tablet Take 2 tablets (8 mg total) by mouth daily. Start the day after chemotherapy for 2 days. Take with food. (Patient not taking: Reported on 12/26/2018) 30 tablet 1   No current  facility-administered medications for this visit.     PHYSICAL EXAMINATION: ECOG PERFORMANCE STATUS: 1 - Symptomatic but completely ambulatory  Today's Vitals   12/26/18 1052 12/26/18 1110  BP: 105/80   Pulse: (!) 109   Resp: 18   Temp: 98.9 F (37.2 C)   TempSrc: Oral   SpO2: 94%   Weight: 205 lb 9.6 oz (93.3 kg)   Height: 5\' 5"  (1.651 m)   PainSc:  0-No pain   Body mass index is 34.21 kg/m.  Filed Weights   12/26/18 1052  Weight: 205 lb 9.6 oz (93.3 kg)    GENERAL: alert, no distress and comfortable SKIN: skin color, texture, turgor are normal, no rashes or significant lesions EYES: conjunctiva are pink  and non-injected, sclera clear OROPHARYNX: no exudate, no erythema; lips, buccal mucosa, and tongue normal  NECK: supple, non-tender LYMPH:  Bulky right cervical adenopathy, improving in size; firm, non-tender, slightly fixed  LUNGS: clear to auscultation with normal breathing effort HEART: regular rate & rhythm and no murmurs and no lower extremity edema ABDOMEN: soft, non-tender, non-distended, normal bowel sounds Musculoskeletal: no cyanosis of digits and no clubbing  PSYCH: alert & oriented x 3, fluent speech NEURO: no focal motor/sensory deficits  LABORATORY DATA:  I have reviewed the data as listed    Component Value Date/Time   NA 141 12/26/2018 1039   K 4.3 12/26/2018 1039   CL 102 12/26/2018 1039   CO2 30 12/26/2018 1039   GLUCOSE 118 (H) 12/26/2018 1039   BUN 11 12/26/2018 1039   CREATININE 0.78 12/26/2018 1039   CREATININE 0.89 10/06/2017 1504   CALCIUM 9.9 12/26/2018 1039   PROT 7.2 11/21/2018 1251   ALBUMIN 4.1 11/21/2018 1251   AST 15 11/21/2018 1251   ALT 17 11/21/2018 1251   ALKPHOS 67 11/21/2018 1251   BILITOT 0.4 11/21/2018 1251   GFRNONAA >60 12/26/2018 1039   GFRAA >60 12/26/2018 1039    No results found for: SPEP, UPEP  Lab Results  Component Value Date   WBC 3.9 (L) 12/26/2018   NEUTROABS 3.0 12/26/2018   HGB 13.5 12/26/2018    HCT 41.5 12/26/2018   MCV 95.6 12/26/2018   PLT 277 12/26/2018      Chemistry      Component Value Date/Time   NA 141 12/26/2018 1039   K 4.3 12/26/2018 1039   CL 102 12/26/2018 1039   CO2 30 12/26/2018 1039   BUN 11 12/26/2018 1039   CREATININE 0.78 12/26/2018 1039   CREATININE 0.89 10/06/2017 1504      Component Value Date/Time   CALCIUM 9.9 12/26/2018 1039   ALKPHOS 67 11/21/2018 1251   AST 15 11/21/2018 1251   ALT 17 11/21/2018 1251   BILITOT 0.4 11/21/2018 1251       RADIOGRAPHIC STUDIES: I have personally reviewed the radiological images as listed below and agreed with the findings in the report. Ir Imaging Guided Port Insertion  Result Date: 12/06/2018 INDICATION: 68 year old female with history of laryngeal cancer. She requires durable venous access for chemotherapy. EXAM: IMPLANTED PORT A CATH PLACEMENT WITH ULTRASOUND AND FLUOROSCOPIC GUIDANCE MEDICATIONS: 2 g Ancef; The antibiotic was administered within an appropriate time interval prior to skin puncture. ANESTHESIA/SEDATION: Versed 4 mg IV; Fentanyl 100 mcg IV; Moderate Sedation Time:  22 minutes The patient was continuously monitored during the procedure by the interventional radiology nurse under my direct supervision. FLUOROSCOPY TIME:  0 minutes, 42 seconds (11 mGy) COMPLICATIONS: None immediate. PROCEDURE: The right neck and chest was prepped with chlorhexidine, and draped in the usual sterile fashion using maximum barrier technique (cap and mask, sterile gown, sterile gloves, large sterile sheet, hand hygiene and cutaneous antiseptic). Local anesthesia was attained by infiltration with 1% lidocaine with epinephrine. Ultrasound demonstrated patency of the right internal jugular vein, and this was documented with an image. Under real-time ultrasound guidance, this vein was accessed with a 21 gauge micropuncture needle and image documentation was performed. A small dermatotomy was made at the access site with an 11  scalpel. A 0.018" wire was advanced into the SVC and the access needle exchanged for a 34F micropuncture vascular sheath. The 0.018" wire was then removed and a 0.035" wire advanced into the IVC. An appropriate location for  the subcutaneous reservoir was selected below the clavicle and an incision was made through the skin and underlying soft tissues. The subcutaneous tissues were then dissected using a combination of blunt and sharp surgical technique and a pocket was formed. A single lumen power injectable portacatheter was then tunneled through the subcutaneous tissues from the pocket to the dermatotomy and the port reservoir placed within the subcutaneous pocket. The venous access site was then serially dilated and a peel away vascular sheath placed over the wire. The wire was removed and the port catheter advanced into position under fluoroscopic guidance. The catheter tip is positioned in the superior cavoatrial junction. This was documented with a spot image. The portacatheter was then tested and found to flush and aspirate well. The port was flushed with saline followed by 100 units/mL heparinized saline. The pocket was then closed in two layers using first subdermal inverted interrupted absorbable sutures followed by a running subcuticular suture. The epidermis was then sealed with Dermabond. The dermatotomy at the venous access site was also closed with Dermabond. IMPRESSION: Successful placement of a right IJ approach Power Port with ultrasound and fluoroscopic guidance. The catheter is ready for use. Electronically Signed   By: Jacqulynn Cadet M.D.   On: 12/06/2018 15:31

## 2018-12-25 NOTE — Progress Notes (Signed)
RD working remotely.  Contacted patient who was not available. Left message with call back number.

## 2018-12-26 ENCOUNTER — Other Ambulatory Visit: Payer: Self-pay

## 2018-12-26 ENCOUNTER — Inpatient Hospital Stay (HOSPITAL_BASED_OUTPATIENT_CLINIC_OR_DEPARTMENT_OTHER): Payer: Medicare HMO | Admitting: Hematology

## 2018-12-26 ENCOUNTER — Ambulatory Visit
Admission: RE | Admit: 2018-12-26 | Discharge: 2018-12-26 | Disposition: A | Discharge status: Home / Self Care | Payer: Medicare HMO | Source: Ambulatory Visit | Attending: Radiation Oncology | Admitting: Radiation Oncology

## 2018-12-26 ENCOUNTER — Inpatient Hospital Stay: Payer: Medicare HMO

## 2018-12-26 ENCOUNTER — Ambulatory Visit: Payer: Medicare HMO | Attending: Radiation Oncology

## 2018-12-26 ENCOUNTER — Encounter: Payer: Self-pay | Admitting: Hematology

## 2018-12-26 VITALS — BP 105/80 | HR 109 | Temp 98.9°F | Resp 18 | Ht 65.0 in | Wt 205.6 lb

## 2018-12-26 DIAGNOSIS — E890 Postprocedural hypothyroidism: Secondary | ICD-10-CM | POA: Diagnosis not present

## 2018-12-26 DIAGNOSIS — T451X5A Adverse effect of antineoplastic and immunosuppressive drugs, initial encounter: Secondary | ICD-10-CM | POA: Insufficient documentation

## 2018-12-26 DIAGNOSIS — R059 Cough, unspecified: Secondary | ICD-10-CM

## 2018-12-26 DIAGNOSIS — C321 Malignant neoplasm of supraglottis: Secondary | ICD-10-CM

## 2018-12-26 DIAGNOSIS — G893 Neoplasm related pain (acute) (chronic): Secondary | ICD-10-CM | POA: Diagnosis not present

## 2018-12-26 DIAGNOSIS — I10 Essential (primary) hypertension: Secondary | ICD-10-CM | POA: Diagnosis not present

## 2018-12-26 DIAGNOSIS — D701 Agranulocytosis secondary to cancer chemotherapy: Secondary | ICD-10-CM | POA: Insufficient documentation

## 2018-12-26 DIAGNOSIS — I700 Atherosclerosis of aorta: Secondary | ICD-10-CM

## 2018-12-26 DIAGNOSIS — C779 Secondary and unspecified malignant neoplasm of lymph node, unspecified: Secondary | ICD-10-CM

## 2018-12-26 DIAGNOSIS — Z79890 Hormone replacement therapy (postmenopausal): Secondary | ICD-10-CM | POA: Diagnosis not present

## 2018-12-26 DIAGNOSIS — Z5111 Encounter for antineoplastic chemotherapy: Secondary | ICD-10-CM

## 2018-12-26 DIAGNOSIS — Z7902 Long term (current) use of antithrombotics/antiplatelets: Secondary | ICD-10-CM | POA: Diagnosis not present

## 2018-12-26 DIAGNOSIS — Z79899 Other long term (current) drug therapy: Secondary | ICD-10-CM | POA: Diagnosis not present

## 2018-12-26 DIAGNOSIS — Z7982 Long term (current) use of aspirin: Secondary | ICD-10-CM

## 2018-12-26 DIAGNOSIS — Z95828 Presence of other vascular implants and grafts: Secondary | ICD-10-CM

## 2018-12-26 DIAGNOSIS — Z951 Presence of aortocoronary bypass graft: Secondary | ICD-10-CM | POA: Diagnosis not present

## 2018-12-26 DIAGNOSIS — Z87891 Personal history of nicotine dependence: Secondary | ICD-10-CM | POA: Diagnosis not present

## 2018-12-26 DIAGNOSIS — R1310 Dysphagia, unspecified: Secondary | ICD-10-CM | POA: Insufficient documentation

## 2018-12-26 DIAGNOSIS — Z510 Encounter for antineoplastic radiation therapy: Secondary | ICD-10-CM | POA: Diagnosis not present

## 2018-12-26 DIAGNOSIS — R05 Cough: Secondary | ICD-10-CM

## 2018-12-26 LAB — CBC WITH DIFFERENTIAL (CANCER CENTER ONLY)
Abs Immature Granulocytes: 0.01 10*3/uL (ref 0.00–0.07)
Basophils Absolute: 0 10*3/uL (ref 0.0–0.1)
Basophils Relative: 1 %
Eosinophils Absolute: 0.1 10*3/uL (ref 0.0–0.5)
Eosinophils Relative: 1 %
HCT: 41.5 % (ref 36.0–46.0)
Hemoglobin: 13.5 g/dL (ref 12.0–15.0)
Immature Granulocytes: 0 %
Lymphocytes Relative: 13 %
Lymphs Abs: 0.5 10*3/uL — ABNORMAL LOW (ref 0.7–4.0)
MCH: 31.1 pg (ref 26.0–34.0)
MCHC: 32.5 g/dL (ref 30.0–36.0)
MCV: 95.6 fL (ref 80.0–100.0)
Monocytes Absolute: 0.3 10*3/uL (ref 0.1–1.0)
Monocytes Relative: 8 %
Neutro Abs: 3 10*3/uL (ref 1.7–7.7)
Neutrophils Relative %: 77 %
Platelet Count: 277 10*3/uL (ref 150–400)
RBC: 4.34 MIL/uL (ref 3.87–5.11)
RDW: 12.9 % (ref 11.5–15.5)
WBC Count: 3.9 10*3/uL — ABNORMAL LOW (ref 4.0–10.5)
nRBC: 0 % (ref 0.0–0.2)

## 2018-12-26 LAB — BASIC METABOLIC PANEL - CANCER CENTER ONLY
Anion gap: 9 (ref 5–15)
BUN: 11 mg/dL (ref 8–23)
CO2: 30 mmol/L (ref 22–32)
Calcium: 9.9 mg/dL (ref 8.9–10.3)
Chloride: 102 mmol/L (ref 98–111)
Creatinine: 0.78 mg/dL (ref 0.44–1.00)
GFR, Est AFR Am: >60 mL/min (ref >60)
GFR, Estimated: >60 mL/min (ref >60)
Glucose, Bld: 118 mg/dL — ABNORMAL HIGH (ref 70–99)
Potassium: 4.3 mmol/L (ref 3.5–5.1)
Sodium: 141 mmol/L (ref 135–145)

## 2018-12-26 LAB — MAGNESIUM: Magnesium: 1.9 mg/dL (ref 1.7–2.4)

## 2018-12-26 MED ORDER — SODIUM CHLORIDE 0.9% FLUSH
10.0000 mL | Freq: Once | INTRAVENOUS | Status: AC
  Administered 2018-12-26: 10 mL
  Filled 2018-12-26: qty 10

## 2018-12-26 MED ORDER — HEPARIN SOD (PORK) LOCK FLUSH 100 UNIT/ML IV SOLN
500.0000 [IU] | Freq: Once | INTRAVENOUS | Status: AC
  Administered 2018-12-26: 500 [IU]
  Filled 2018-12-26: qty 5

## 2018-12-27 ENCOUNTER — Other Ambulatory Visit: Payer: Self-pay

## 2018-12-27 ENCOUNTER — Ambulatory Visit
Admission: RE | Admit: 2018-12-27 | Discharge: 2018-12-27 | Disposition: A | Discharge status: Home / Self Care | Payer: Medicare HMO | Source: Ambulatory Visit | Attending: Radiation Oncology | Admitting: Radiation Oncology

## 2018-12-27 ENCOUNTER — Inpatient Hospital Stay: Payer: Medicare HMO

## 2018-12-27 ENCOUNTER — Telehealth: Payer: Self-pay | Admitting: Hematology

## 2018-12-27 VITALS — BP 101/73 | HR 100 | Temp 98.0°F | Resp 16

## 2018-12-27 DIAGNOSIS — C321 Malignant neoplasm of supraglottis: Secondary | ICD-10-CM | POA: Diagnosis not present

## 2018-12-27 DIAGNOSIS — E890 Postprocedural hypothyroidism: Secondary | ICD-10-CM | POA: Diagnosis not present

## 2018-12-27 DIAGNOSIS — G893 Neoplasm related pain (acute) (chronic): Secondary | ICD-10-CM | POA: Diagnosis not present

## 2018-12-27 DIAGNOSIS — Z510 Encounter for antineoplastic radiation therapy: Secondary | ICD-10-CM | POA: Diagnosis not present

## 2018-12-27 DIAGNOSIS — Z79890 Hormone replacement therapy (postmenopausal): Secondary | ICD-10-CM | POA: Diagnosis not present

## 2018-12-27 DIAGNOSIS — T451X5A Adverse effect of antineoplastic and immunosuppressive drugs, initial encounter: Secondary | ICD-10-CM | POA: Diagnosis not present

## 2018-12-27 DIAGNOSIS — C779 Secondary and unspecified malignant neoplasm of lymph node, unspecified: Secondary | ICD-10-CM | POA: Diagnosis not present

## 2018-12-27 DIAGNOSIS — Z7902 Long term (current) use of antithrombotics/antiplatelets: Secondary | ICD-10-CM | POA: Diagnosis not present

## 2018-12-27 DIAGNOSIS — D701 Agranulocytosis secondary to cancer chemotherapy: Secondary | ICD-10-CM | POA: Diagnosis not present

## 2018-12-27 DIAGNOSIS — Z951 Presence of aortocoronary bypass graft: Secondary | ICD-10-CM | POA: Diagnosis not present

## 2018-12-27 DIAGNOSIS — Z5111 Encounter for antineoplastic chemotherapy: Secondary | ICD-10-CM | POA: Diagnosis not present

## 2018-12-27 DIAGNOSIS — Z87891 Personal history of nicotine dependence: Secondary | ICD-10-CM | POA: Diagnosis not present

## 2018-12-27 DIAGNOSIS — Z79899 Other long term (current) drug therapy: Secondary | ICD-10-CM | POA: Diagnosis not present

## 2018-12-27 DIAGNOSIS — I10 Essential (primary) hypertension: Secondary | ICD-10-CM | POA: Diagnosis not present

## 2018-12-27 DIAGNOSIS — I700 Atherosclerosis of aorta: Secondary | ICD-10-CM | POA: Diagnosis not present

## 2018-12-27 MED ORDER — DEXAMETHASONE SODIUM PHOSPHATE 10 MG/ML IJ SOLN
10.0000 mg | Freq: Once | INTRAMUSCULAR | Status: AC
  Administered 2018-12-27: 10 mg via INTRAVENOUS

## 2018-12-27 MED ORDER — DEXAMETHASONE SODIUM PHOSPHATE 10 MG/ML IJ SOLN
INTRAMUSCULAR | Status: AC
  Filled 2018-12-27: qty 1

## 2018-12-27 MED ORDER — SODIUM CHLORIDE 0.9% FLUSH
10.0000 mL | INTRAVENOUS | Status: DC | PRN
  Administered 2018-12-27: 09:00:00 10 mL
  Filled 2018-12-27: qty 10

## 2018-12-27 MED ORDER — PALONOSETRON HCL INJECTION 0.25 MG/5ML
0.2500 mg | Freq: Once | INTRAVENOUS | Status: AC
  Administered 2018-12-27: 08:00:00 0.25 mg via INTRAVENOUS

## 2018-12-27 MED ORDER — SODIUM CHLORIDE 0.9 % IV SOLN
210.0000 mg | Freq: Once | INTRAVENOUS | Status: AC
  Administered 2018-12-27: 09:00:00 210 mg via INTRAVENOUS
  Filled 2018-12-27: qty 21

## 2018-12-27 MED ORDER — SODIUM CHLORIDE 0.9 % IV SOLN
Freq: Once | INTRAVENOUS | Status: AC
  Administered 2018-12-27: 08:00:00 via INTRAVENOUS
  Filled 2018-12-27: qty 250

## 2018-12-27 MED ORDER — PALONOSETRON HCL INJECTION 0.25 MG/5ML
INTRAVENOUS | Status: AC
  Filled 2018-12-27: qty 5

## 2018-12-27 MED ORDER — HEPARIN SOD (PORK) LOCK FLUSH 100 UNIT/ML IV SOLN
500.0000 [IU] | Freq: Once | INTRAVENOUS | Status: AC | PRN
  Administered 2018-12-27: 09:00:00 500 [IU]
  Filled 2018-12-27: qty 5

## 2018-12-27 NOTE — Therapy (Signed)
[facility] [address] [place], [place], [address] Phone: [phone]   Fax:  [phone]  Speech Language Pathology Treatment  Patient Details  Name: Gloria Murray MRN: [id number] Date of Birth: 06/16/1951 Referring Provider (SLP): Eppie Gibson MD   Encounter Date: 12/26/2018  End of Session - 12/27/18 2359    Visit Number  2    Number of Visits  3    Date for SLP Re-Evaluation  02/25/19    SLP Start Time  0910    SLP Stop Time   0930    SLP Time Calculation (min)  20 min    Activity Tolerance  Patient tolerated treatment well       Past Medical History:  Diagnosis Date  . Allergy   . Carotid artery occlusion   . Heart murmur   . Hyperlipidemia   . Hypertension   . Hypothyroidism   . Laryngeal mass   . Osteoporosis   . Thyroid disease   . Wears glasses   . Wears upper complete and lower partial dentures     Past Surgical History:  Procedure Laterality Date  . ARCH AORTOGRAM N/A 07/23/2014   Procedure: ARCH AORTOGRAM;  Surgeon: Rosetta Posner, MD;  Location[facility] CATH LAB;  Service: Cardiovascular;  Laterality: N/A;  . CAROTID ANGIOGRAM N/A 07/23/2014   Procedure: CAROTID ANGIOGRAM;  Surgeon: Rosetta Posner, MD;  Location: [facility] CATH LAB;  Service: Cardiovascular;  Laterality: N/A;  . CAROTID ENDARTERECTOMY  12/11/2008   left  . CAROTID STENT INSERTION Left 10/14/2014   Procedure: CAROTID STENT INSERTION;  Surgeon: Serafina Mitchell, MD;  Location: [facility] CATH LAB;  Service: Cardiovascular;  Laterality: Left;   carotid  . CARPAL TUNNEL RELEASE     X 2, right wrist  . COLONOSCOPY  Jan. 2, 2015  . DILATION AND CURETTAGE OF UTERUS     X 2  . IR IMAGING GUIDED PORT INSERTION  12/06/2018  . laceration hand Right    hand  . LARYNGOSCOPY N/A 11/08/2018   Procedure: DIRECT MICROLARYNGOSCOPY WITH BIOPSY;  Surgeon: Melida Quitter, MD;  Location: [facility];  Service: ENT;  Laterality: N/A;  . RIGID ESOPHAGOSCOPY N/A 11/08/2018   Procedure: ESOPHAGOSCOPY;  Surgeon: Melida Quitter, MD;  Location: [facility];  Service: ENT;  Laterality: N/A;  . THYROIDECTOMY    . TUBAL LIGATION  1975   bilateral    There were no vitals filed for this visit.         ADULT SLP TREATMENT - 12/27/18 0001      General Information   Behavior/Cognition  Alert;Cooperative;Pleasant mood      Dysphagia Treatment   Temperature Spikes Noted  No    Respiratory Status  Room air    Treatment Methods  Patient/caregiver education    Patient observed directly with PO's  No    Other treatment/comments  Pt reported she was eating same diet as during eval. Pt stated she did HEP this morning, and did not have any questions about any of the exercises. Pt denied overt s/sx aspiration PNA. SLP let pt know she could call clinic with questions until next check-in/appointment.       Assessment / Recommendations / Plan   Plan  Continue with current plan of care      Dysphagia Recommendations   Diet recommendations  --   as tolerated   Liquids provided via  Cup    Medication Administration  --   as tolerated  Progression Toward Goals   Progression toward goals  Progressing toward goals         SLP Short Term Goals - 12/28/18 0001      SLP SHORT TERM GOAL #1   Title  pt will demonstrate understanding of how to accurately complete HEP    Time  1    Period  --   session   Status  On-going      SLP SHORT TERM GOAL #2   Title  pt will tell SLP 3 overt s/s aspiration PNA    Time  2    Period  --   visits   Status  On-going      SLP SHORT TERM GOAL #3   Title  pt will tell SLP how a food journal can assist return to safest pt diet    Time  2    Period  --   vistis   Status  On-going       SLP Long Term Goals - 12/28/18 0001      SLP LONG TERM GOAL #1   Title  pt will demo understanding how to complete HEP with accuracy over two visits    Time  3    Period  --   visits   Status  On-going      SLP LONG TERM GOAL #2   Title   pt will tell SLP when she can decr frequency of HEP to twice a week    Time  3    Period  --   visits   Status  On-going       Plan - 12/27/18 2359    Clinical Impression Statement  This visit was completed via telephone at the direction of medical director Dr. Eppie Gibson due to additional pt safety necessary from the COVID-19 pandemic. Pt has described oropharyngeal swallowing as functional/normal for what sounds like dys III/regular diet. Pt cont to express some difficulty with pharyngeal clearance with tougher meats. There were not overt s/s aspiration reported by pt, nor any overt s/s aspiration PNA reported by pt. The probability of swallowing difficulty increases dramatically with the initiation of chemo and radiation therapy. Pt will need to be followed by SLP for regular assessment of accurate HEP completion as well as for safety with POs both during and following treatment/s. Visits will be conducted via telephone or telehealth, remotely, until otherwise directed by medical director Dr. Eppie Gibson.    Speech Therapy Frequency  --   once every approx 4 weeks   Duration  --   3 visits   Treatment/Interventions  Aspiration precaution training;Pharyngeal strengthening exercises;Diet toleration management by SLP;Compensatory techniques;SLP instruction and feedback;Patient/family education;Compensatory strategies;Trials of upgraded texture/liquids    Potential to Achieve Goals  Good    SLP Home Exercise Plan  described to pt today    Consulted and Agree with Plan of Care  Patient       Patient will benefit from skilled therapeutic intervention in order to improve the following deficits and impairments:   Dysphagia, unspecified type    Problem List Patient Active Problem List   Diagnosis Date Noted  . Leukopenia due to antineoplastic chemotherapy ([facility]) 12/26/2018  . Port-A-Cath in place 12/19/2018  . Hypomagnesemia 12/19/2018  . Cancer-related pain 12/12/2018  . Malignant  neoplasm of supraglottis ([place]) 11/16/2018  . Osteoporosis 07/07/2017  . Seasonal allergies 07/07/2017  . Carotid stenosis 10/14/2014  . Hypothyroidism 01/04/2007  . HYPERLIPIDEMIA, MIXED, MILD 01/04/2007  . HYPERTENSION, BENIGN  ESSENTIAL 01/04/2007    [facility] ,[place], [place]  12/28/2018, 12:02 AM  [facility] [address] [place], [place], [address] Phone: [phone]   Fax:  [phone]   Name: Gloria Murray MRN: [id number] Date of Birth: 1951/01/17

## 2018-12-27 NOTE — Patient Instructions (Signed)
[facility] Discharge Instructions for Patients Receiving Chemotherapy  Today you received the following chemotherapy agents Carboplatin  To help prevent nausea and vomiting after your treatment, we encourage you to take your nausea medication as directed   If you develop nausea and vomiting that is not controlled by your nausea medication, call the clinic.   BELOW ARE SYMPTOMS THAT SHOULD BE REPORTED IMMEDIATELY:  *FEVER GREATER THAN 100.5 F  *CHILLS WITH OR WITHOUT FEVER  NAUSEA AND VOMITING THAT IS NOT CONTROLLED WITH YOUR NAUSEA MEDICATION  *UNUSUAL SHORTNESS OF BREATH  *UNUSUAL BRUISING OR BLEEDING  TENDERNESS IN MOUTH AND THROAT WITH OR WITHOUT PRESENCE OF ULCERS  *URINARY PROBLEMS  *BOWEL PROBLEMS  UNUSUAL RASH Items with * indicate a potential emergency and should be followed up as soon as possible.  Feel free to call the clinic should you have any questions or concerns. The clinic phone number is [phone].  Please show the CHEMO ALERT CARD at check-in to the Emergency Department and triage nurse.   

## 2018-12-27 NOTE — Telephone Encounter (Signed)
Per 5/27 los No change in appts

## 2018-12-28 ENCOUNTER — Ambulatory Visit
Admission: RE | Admit: 2018-12-28 | Discharge: 2018-12-28 | Disposition: A | Discharge status: Home / Self Care | Payer: Medicare HMO | Source: Ambulatory Visit | Attending: Radiation Oncology | Admitting: Radiation Oncology

## 2018-12-28 ENCOUNTER — Other Ambulatory Visit: Payer: Self-pay

## 2018-12-28 DIAGNOSIS — Z510 Encounter for antineoplastic radiation therapy: Secondary | ICD-10-CM | POA: Diagnosis not present

## 2018-12-28 DIAGNOSIS — C321 Malignant neoplasm of supraglottis: Secondary | ICD-10-CM | POA: Diagnosis not present

## 2018-12-28 DIAGNOSIS — Z7902 Long term (current) use of antithrombotics/antiplatelets: Secondary | ICD-10-CM | POA: Diagnosis not present

## 2018-12-28 DIAGNOSIS — Z951 Presence of aortocoronary bypass graft: Secondary | ICD-10-CM | POA: Diagnosis not present

## 2018-12-28 DIAGNOSIS — Z79890 Hormone replacement therapy (postmenopausal): Secondary | ICD-10-CM | POA: Diagnosis not present

## 2018-12-28 DIAGNOSIS — Z79899 Other long term (current) drug therapy: Secondary | ICD-10-CM | POA: Diagnosis not present

## 2018-12-28 DIAGNOSIS — Z87891 Personal history of nicotine dependence: Secondary | ICD-10-CM | POA: Diagnosis not present

## 2018-12-28 DIAGNOSIS — E890 Postprocedural hypothyroidism: Secondary | ICD-10-CM | POA: Diagnosis not present

## 2018-12-28 DIAGNOSIS — I10 Essential (primary) hypertension: Secondary | ICD-10-CM | POA: Diagnosis not present

## 2018-12-31 ENCOUNTER — Ambulatory Visit
Admission: RE | Admit: 2018-12-31 | Discharge: 2018-12-31 | Disposition: A | Discharge status: Home / Self Care | Payer: Medicare HMO | Source: Ambulatory Visit | Attending: Radiation Oncology | Admitting: Radiation Oncology

## 2018-12-31 ENCOUNTER — Other Ambulatory Visit: Payer: Self-pay

## 2018-12-31 ENCOUNTER — Telehealth: Payer: Self-pay | Admitting: Hematology

## 2018-12-31 DIAGNOSIS — E890 Postprocedural hypothyroidism: Secondary | ICD-10-CM | POA: Diagnosis not present

## 2018-12-31 DIAGNOSIS — Z510 Encounter for antineoplastic radiation therapy: Secondary | ICD-10-CM | POA: Insufficient documentation

## 2018-12-31 DIAGNOSIS — Z87891 Personal history of nicotine dependence: Secondary | ICD-10-CM | POA: Diagnosis not present

## 2018-12-31 DIAGNOSIS — C321 Malignant neoplasm of supraglottis: Secondary | ICD-10-CM | POA: Insufficient documentation

## 2018-12-31 DIAGNOSIS — Z79890 Hormone replacement therapy (postmenopausal): Secondary | ICD-10-CM | POA: Insufficient documentation

## 2018-12-31 DIAGNOSIS — Z951 Presence of aortocoronary bypass graft: Secondary | ICD-10-CM | POA: Diagnosis not present

## 2018-12-31 DIAGNOSIS — Z7902 Long term (current) use of antithrombotics/antiplatelets: Secondary | ICD-10-CM | POA: Insufficient documentation

## 2018-12-31 DIAGNOSIS — E785 Hyperlipidemia, unspecified: Secondary | ICD-10-CM | POA: Diagnosis not present

## 2018-12-31 DIAGNOSIS — Z79899 Other long term (current) drug therapy: Secondary | ICD-10-CM | POA: Insufficient documentation

## 2018-12-31 DIAGNOSIS — I10 Essential (primary) hypertension: Secondary | ICD-10-CM | POA: Insufficient documentation

## 2018-12-31 NOTE — Telephone Encounter (Signed)
Lab closing early 6/10. Spoke with patient re new time.

## 2019-01-01 ENCOUNTER — Encounter: Payer: Self-pay | Admitting: *Deleted

## 2019-01-01 ENCOUNTER — Inpatient Hospital Stay: Payer: Medicare HMO | Attending: Hematology | Admitting: Nutrition

## 2019-01-01 ENCOUNTER — Ambulatory Visit
Admission: RE | Admit: 2019-01-01 | Discharge: 2019-01-01 | Disposition: A | Discharge status: Home / Self Care | Payer: Medicare HMO | Source: Ambulatory Visit | Attending: Radiation Oncology | Admitting: Radiation Oncology

## 2019-01-01 ENCOUNTER — Other Ambulatory Visit: Payer: Self-pay

## 2019-01-01 DIAGNOSIS — R634 Abnormal weight loss: Secondary | ICD-10-CM | POA: Insufficient documentation

## 2019-01-01 DIAGNOSIS — C321 Malignant neoplasm of supraglottis: Secondary | ICD-10-CM | POA: Insufficient documentation

## 2019-01-01 DIAGNOSIS — T451X5A Adverse effect of antineoplastic and immunosuppressive drugs, initial encounter: Secondary | ICD-10-CM | POA: Insufficient documentation

## 2019-01-01 DIAGNOSIS — I10 Essential (primary) hypertension: Secondary | ICD-10-CM | POA: Diagnosis not present

## 2019-01-01 DIAGNOSIS — Z79890 Hormone replacement therapy (postmenopausal): Secondary | ICD-10-CM | POA: Diagnosis not present

## 2019-01-01 DIAGNOSIS — I700 Atherosclerosis of aorta: Secondary | ICD-10-CM | POA: Insufficient documentation

## 2019-01-01 DIAGNOSIS — Z510 Encounter for antineoplastic radiation therapy: Secondary | ICD-10-CM | POA: Diagnosis not present

## 2019-01-01 DIAGNOSIS — Z5111 Encounter for antineoplastic chemotherapy: Secondary | ICD-10-CM | POA: Diagnosis not present

## 2019-01-01 DIAGNOSIS — Z79899 Other long term (current) drug therapy: Secondary | ICD-10-CM | POA: Insufficient documentation

## 2019-01-01 DIAGNOSIS — D701 Agranulocytosis secondary to cancer chemotherapy: Secondary | ICD-10-CM | POA: Insufficient documentation

## 2019-01-01 DIAGNOSIS — E46 Unspecified protein-calorie malnutrition: Secondary | ICD-10-CM | POA: Insufficient documentation

## 2019-01-01 DIAGNOSIS — Z7902 Long term (current) use of antithrombotics/antiplatelets: Secondary | ICD-10-CM | POA: Diagnosis not present

## 2019-01-01 DIAGNOSIS — Z87891 Personal history of nicotine dependence: Secondary | ICD-10-CM | POA: Diagnosis not present

## 2019-01-01 DIAGNOSIS — R112 Nausea with vomiting, unspecified: Secondary | ICD-10-CM | POA: Diagnosis not present

## 2019-01-01 DIAGNOSIS — E890 Postprocedural hypothyroidism: Secondary | ICD-10-CM | POA: Diagnosis not present

## 2019-01-01 DIAGNOSIS — Z951 Presence of aortocoronary bypass graft: Secondary | ICD-10-CM | POA: Diagnosis not present

## 2019-01-01 DIAGNOSIS — Z7982 Long term (current) use of aspirin: Secondary | ICD-10-CM | POA: Diagnosis not present

## 2019-01-01 DIAGNOSIS — Z923 Personal history of irradiation: Secondary | ICD-10-CM | POA: Insufficient documentation

## 2019-01-01 NOTE — Progress Notes (Signed)
Head and Neck Cancer Simulation, IMRT treatment planning, and Special treatment procedure note   Outpatient  Diagnosis:    ICD-10-CM   1. 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  All relevant records and images related to the planned course of therapy were reviewed.  The patient freely provided informed written consent to proceed with treatment after reviewing the details related to the planned course of therapy. The consent form was witnessed and verified by the simulation staff.    The patient was laid in the supine position on the table. An Aquaplast head and shoulder mask was custom fitted to the patient's anatomy. High-resolution CT axial imaging was obtained of the head and neck with contrast. I verified that the quality of the imaging is good for treatment planning. 1 Medically Necessary Treatment Device was fabricated and supervised by me: Aquaplast mask.  Treatment planning note I plan to treat the patient with IMRT. I plan to treat the patient's tumor and bilaterall neck nodes. I plan to treat to a total dose of 70 Gray in 35  fractions. Dose calculation was ordered from dosimetry.  IMRT planning Note  IMRT is medically necessary and an important modality to deliver adequate dose to the patient's at risk tissues while sparing the patient's normal structures, including the: esophagus, parotid tissue, mandible, brain stem, spinal cord, oral cavity, brachial plexus.  This justifies the use of IMRT in the patient's treatment.   Special Treatment Procedure Note:  The patient will be receiving chemotherapy concurrently. Chemotherapy heightens the risk of side effects. I have considered this during the patient's treatment planning process and will monitor the patient accordingly for side effects on a weekly basis. Concurrent chemotherapy increases the complexity of this patient's treatment and therefore this  constitutes a special treatment procedure.  -----------------------------------  Eppie Gibson, MD

## 2019-01-01 NOTE — Progress Notes (Signed)
Nutrition follow-up completed with patient receiving concurrent chemoradiation therapy for [place] cancer. Patient reports she only has nausea first thing in the morning.  This improves after she takes her nausea medication. Patient is drinking 1 oral nutrition supplement daily. She has thick saliva which is improved with baking soda and salt water rinses. Patient denies other nutrition impact symptoms. Current weight documented is 205.6 pounds May 27 decreased from 215 pounds April 29.  This is 4% weight loss over 4 weeks which is not significant.  Nutrition diagnosis: Unintended weight loss continues.  Intervention: I educated patient to continue strategies for increased calories and protein. Recommended small frequent meals and snacks. Educated patient to increase Ensure Enlive to 2 bottles daily. Provided additional samples of chocolate and strawberry Ensure Enlive. Teach back method used.  Monitoring, evaluation, goals: Patient will tolerate increased calories and protein to minimize weight loss.  Next visit: Tuesday, June 9.  **Disclaimer: This note was dictated with voice recognition software. Similar sounding words can inadvertently be transcribed and this note may contain transcription errors which may not have been corrected upon publication of note.**

## 2019-01-02 ENCOUNTER — Other Ambulatory Visit: Payer: Self-pay

## 2019-01-02 ENCOUNTER — Inpatient Hospital Stay (HOSPITAL_BASED_OUTPATIENT_CLINIC_OR_DEPARTMENT_OTHER): Payer: Medicare HMO | Admitting: Hematology

## 2019-01-02 ENCOUNTER — Inpatient Hospital Stay: Payer: Medicare HMO

## 2019-01-02 ENCOUNTER — Ambulatory Visit
Admission: RE | Admit: 2019-01-02 | Discharge: 2019-01-02 | Disposition: A | Discharge status: Home / Self Care | Payer: Medicare HMO | Source: Ambulatory Visit | Attending: Radiation Oncology | Admitting: Radiation Oncology

## 2019-01-02 ENCOUNTER — Encounter: Payer: Self-pay | Admitting: Hematology

## 2019-01-02 VITALS — BP 119/84 | HR 117 | Temp 98.2°F | Resp 16 | Ht 65.0 in | Wt 197.1 lb

## 2019-01-02 DIAGNOSIS — I700 Atherosclerosis of aorta: Secondary | ICD-10-CM

## 2019-01-02 DIAGNOSIS — C321 Malignant neoplasm of supraglottis: Secondary | ICD-10-CM

## 2019-01-02 DIAGNOSIS — Z7982 Long term (current) use of aspirin: Secondary | ICD-10-CM

## 2019-01-02 DIAGNOSIS — E46 Unspecified protein-calorie malnutrition: Secondary | ICD-10-CM

## 2019-01-02 DIAGNOSIS — T451X5A Adverse effect of antineoplastic and immunosuppressive drugs, initial encounter: Secondary | ICD-10-CM | POA: Insufficient documentation

## 2019-01-02 DIAGNOSIS — Z5111 Encounter for antineoplastic chemotherapy: Secondary | ICD-10-CM | POA: Diagnosis not present

## 2019-01-02 DIAGNOSIS — D701 Agranulocytosis secondary to cancer chemotherapy: Secondary | ICD-10-CM | POA: Diagnosis not present

## 2019-01-02 DIAGNOSIS — Z79899 Other long term (current) drug therapy: Secondary | ICD-10-CM

## 2019-01-02 DIAGNOSIS — R112 Nausea with vomiting, unspecified: Secondary | ICD-10-CM | POA: Diagnosis not present

## 2019-01-02 DIAGNOSIS — Z510 Encounter for antineoplastic radiation therapy: Secondary | ICD-10-CM | POA: Diagnosis not present

## 2019-01-02 DIAGNOSIS — Z95828 Presence of other vascular implants and grafts: Secondary | ICD-10-CM

## 2019-01-02 DIAGNOSIS — E890 Postprocedural hypothyroidism: Secondary | ICD-10-CM | POA: Diagnosis not present

## 2019-01-02 DIAGNOSIS — Z87891 Personal history of nicotine dependence: Secondary | ICD-10-CM | POA: Diagnosis not present

## 2019-01-02 DIAGNOSIS — Z951 Presence of aortocoronary bypass graft: Secondary | ICD-10-CM | POA: Diagnosis not present

## 2019-01-02 DIAGNOSIS — Z79890 Hormone replacement therapy (postmenopausal): Secondary | ICD-10-CM | POA: Diagnosis not present

## 2019-01-02 DIAGNOSIS — I10 Essential (primary) hypertension: Secondary | ICD-10-CM | POA: Diagnosis not present

## 2019-01-02 DIAGNOSIS — R634 Abnormal weight loss: Secondary | ICD-10-CM | POA: Diagnosis not present

## 2019-01-02 DIAGNOSIS — Z923 Personal history of irradiation: Secondary | ICD-10-CM | POA: Diagnosis not present

## 2019-01-02 DIAGNOSIS — Z7902 Long term (current) use of antithrombotics/antiplatelets: Secondary | ICD-10-CM | POA: Diagnosis not present

## 2019-01-02 LAB — CBC WITH DIFFERENTIAL (CANCER CENTER ONLY)
Abs Immature Granulocytes: 0.01 10*3/uL (ref 0.00–0.07)
Basophils Absolute: 0 10*3/uL (ref 0.0–0.1)
Basophils Relative: 1 %
Eosinophils Absolute: 0 10*3/uL (ref 0.0–0.5)
Eosinophils Relative: 1 %
HCT: 43.4 % (ref 36.0–46.0)
Hemoglobin: 14.4 g/dL (ref 12.0–15.0)
Immature Granulocytes: 0 %
Lymphocytes Relative: 10 %
Lymphs Abs: 0.4 10*3/uL — ABNORMAL LOW (ref 0.7–4.0)
MCH: 30.9 pg (ref 26.0–34.0)
MCHC: 33.2 g/dL (ref 30.0–36.0)
MCV: 93.1 fL (ref 80.0–100.0)
Monocytes Absolute: 0.3 10*3/uL (ref 0.1–1.0)
Monocytes Relative: 9 %
Neutro Abs: 2.9 10*3/uL (ref 1.7–7.7)
Neutrophils Relative %: 79 %
Platelet Count: 191 10*3/uL (ref 150–400)
RBC: 4.66 MIL/uL (ref 3.87–5.11)
RDW: 13.2 % (ref 11.5–15.5)
WBC Count: 3.7 10*3/uL — ABNORMAL LOW (ref 4.0–10.5)
nRBC: 0 % (ref 0.0–0.2)

## 2019-01-02 LAB — BASIC METABOLIC PANEL - CANCER CENTER ONLY
Anion gap: 13 (ref 5–15)
BUN: 22 mg/dL (ref 8–23)
CO2: 30 mmol/L (ref 22–32)
Calcium: 10.3 mg/dL (ref 8.9–10.3)
Chloride: 97 mmol/L — ABNORMAL LOW (ref 98–111)
Creatinine: 0.87 mg/dL (ref 0.44–1.00)
GFR, Est AFR Am: >60 mL/min (ref >60)
GFR, Estimated: >60 mL/min (ref >60)
Glucose, Bld: 103 mg/dL — ABNORMAL HIGH (ref 70–99)
Potassium: 3.9 mmol/L (ref 3.5–5.1)
Sodium: 140 mmol/L (ref 135–145)

## 2019-01-02 LAB — MAGNESIUM: Magnesium: 1.7 mg/dL (ref 1.7–2.4)

## 2019-01-02 MED ORDER — HEPARIN SOD (PORK) LOCK FLUSH 100 UNIT/ML IV SOLN
500.0000 [IU] | Freq: Once | INTRAVENOUS | Status: AC
  Administered 2019-01-02: 12:00:00 500 [IU]
  Filled 2019-01-02: qty 5

## 2019-01-02 MED ORDER — SODIUM CHLORIDE 0.9% FLUSH
10.0000 mL | Freq: Once | INTRAVENOUS | Status: AC
  Administered 2019-01-02: 10 mL
  Filled 2019-01-02: qty 10

## 2019-01-03 ENCOUNTER — Other Ambulatory Visit: Payer: Self-pay

## 2019-01-03 ENCOUNTER — Inpatient Hospital Stay: Payer: Medicare HMO

## 2019-01-03 ENCOUNTER — Ambulatory Visit
Admission: RE | Admit: 2019-01-03 | Discharge: 2019-01-03 | Disposition: A | Discharge status: Home / Self Care | Payer: Medicare HMO | Source: Ambulatory Visit | Attending: Radiation Oncology | Admitting: Radiation Oncology

## 2019-01-03 ENCOUNTER — Telehealth: Payer: Self-pay | Admitting: Hematology

## 2019-01-03 VITALS — BP 122/86 | HR 94 | Temp 98.0°F | Resp 18

## 2019-01-03 DIAGNOSIS — Z87891 Personal history of nicotine dependence: Secondary | ICD-10-CM | POA: Diagnosis not present

## 2019-01-03 DIAGNOSIS — T451X5A Adverse effect of antineoplastic and immunosuppressive drugs, initial encounter: Secondary | ICD-10-CM | POA: Diagnosis not present

## 2019-01-03 DIAGNOSIS — Z7902 Long term (current) use of antithrombotics/antiplatelets: Secondary | ICD-10-CM | POA: Diagnosis not present

## 2019-01-03 DIAGNOSIS — E46 Unspecified protein-calorie malnutrition: Secondary | ICD-10-CM | POA: Diagnosis not present

## 2019-01-03 DIAGNOSIS — Z79890 Hormone replacement therapy (postmenopausal): Secondary | ICD-10-CM | POA: Diagnosis not present

## 2019-01-03 DIAGNOSIS — E890 Postprocedural hypothyroidism: Secondary | ICD-10-CM | POA: Diagnosis not present

## 2019-01-03 DIAGNOSIS — C321 Malignant neoplasm of supraglottis: Secondary | ICD-10-CM | POA: Diagnosis not present

## 2019-01-03 DIAGNOSIS — R634 Abnormal weight loss: Secondary | ICD-10-CM | POA: Diagnosis not present

## 2019-01-03 DIAGNOSIS — Z95828 Presence of other vascular implants and grafts: Secondary | ICD-10-CM

## 2019-01-03 DIAGNOSIS — Z5111 Encounter for antineoplastic chemotherapy: Secondary | ICD-10-CM | POA: Diagnosis not present

## 2019-01-03 DIAGNOSIS — Z79899 Other long term (current) drug therapy: Secondary | ICD-10-CM | POA: Diagnosis not present

## 2019-01-03 DIAGNOSIS — D701 Agranulocytosis secondary to cancer chemotherapy: Secondary | ICD-10-CM | POA: Diagnosis not present

## 2019-01-03 DIAGNOSIS — I10 Essential (primary) hypertension: Secondary | ICD-10-CM | POA: Diagnosis not present

## 2019-01-03 DIAGNOSIS — R112 Nausea with vomiting, unspecified: Secondary | ICD-10-CM | POA: Diagnosis not present

## 2019-01-03 DIAGNOSIS — Z510 Encounter for antineoplastic radiation therapy: Secondary | ICD-10-CM | POA: Diagnosis not present

## 2019-01-03 DIAGNOSIS — Z951 Presence of aortocoronary bypass graft: Secondary | ICD-10-CM | POA: Diagnosis not present

## 2019-01-03 MED ORDER — SODIUM CHLORIDE 0.9% FLUSH
10.0000 mL | INTRAVENOUS | Status: DC | PRN
  Administered 2019-01-03: 10 mL
  Filled 2019-01-03: qty 10

## 2019-01-03 MED ORDER — PALONOSETRON HCL INJECTION 0.25 MG/5ML
0.2500 mg | Freq: Once | INTRAVENOUS | Status: AC
  Administered 2019-01-03: 0.25 mg via INTRAVENOUS

## 2019-01-03 MED ORDER — DEXAMETHASONE SODIUM PHOSPHATE 10 MG/ML IJ SOLN
INTRAMUSCULAR | Status: AC
  Filled 2019-01-03: qty 1

## 2019-01-03 MED ORDER — SODIUM CHLORIDE 0.9 % IV SOLN
Freq: Once | INTRAVENOUS | Status: DC
  Filled 2019-01-03: qty 1000

## 2019-01-03 MED ORDER — SODIUM CHLORIDE 0.9 % IV SOLN
Freq: Once | INTRAVENOUS | Status: AC
  Administered 2019-01-03: 08:00:00 via INTRAVENOUS
  Filled 2019-01-03: qty 250

## 2019-01-03 MED ORDER — SODIUM CHLORIDE 0.9 % IV SOLN
210.0000 mg | Freq: Once | INTRAVENOUS | Status: AC
  Administered 2019-01-03: 210 mg via INTRAVENOUS
  Filled 2019-01-03: qty 21

## 2019-01-03 MED ORDER — DEXAMETHASONE SODIUM PHOSPHATE 10 MG/ML IJ SOLN
10.0000 mg | Freq: Once | INTRAMUSCULAR | Status: AC
  Administered 2019-01-03: 10 mg via INTRAVENOUS

## 2019-01-03 MED ORDER — PALONOSETRON HCL INJECTION 0.25 MG/5ML
INTRAVENOUS | Status: AC
  Filled 2019-01-03: qty 5

## 2019-01-03 MED ORDER — HEPARIN SOD (PORK) LOCK FLUSH 100 UNIT/ML IV SOLN
500.0000 [IU] | Freq: Once | INTRAVENOUS | Status: AC | PRN
  Administered 2019-01-03: 500 [IU]
  Filled 2019-01-03: qty 5

## 2019-01-03 NOTE — Patient Instructions (Signed)
[facility] Discharge Instructions for Patients Receiving Chemotherapy  Today you received the following chemotherapy agents Carboplatin  To help prevent nausea and vomiting after your treatment, we encourage you to take your nausea medication as directed   If you develop nausea and vomiting that is not controlled by your nausea medication, call the clinic.   BELOW ARE SYMPTOMS THAT SHOULD BE REPORTED IMMEDIATELY:  *FEVER GREATER THAN 100.5 F  *CHILLS WITH OR WITHOUT FEVER  NAUSEA AND VOMITING THAT IS NOT CONTROLLED WITH YOUR NAUSEA MEDICATION  *UNUSUAL SHORTNESS OF BREATH  *UNUSUAL BRUISING OR BLEEDING  TENDERNESS IN MOUTH AND THROAT WITH OR WITHOUT PRESENCE OF ULCERS  *URINARY PROBLEMS  *BOWEL PROBLEMS  UNUSUAL RASH Items with * indicate a potential emergency and should be followed up as soon as possible.  Feel free to call the clinic should you have any questions or concerns. The clinic phone number is [phone].  Please show the CHEMO ALERT CARD at check-in to the Emergency Department and triage nurse.   

## 2019-01-03 NOTE — Progress Notes (Signed)
Infusion room didn't have patient scheduled for extra 2 hours of IVF. No IVF received today. Pt will return on 01/04/19 for 1LNS w Magnesium Sulfate 2 gms.  Hardie Pulley, PharmD, BCPS, BCOP

## 2019-01-03 NOTE — Telephone Encounter (Signed)
per 6/3 los appts already scheduled.

## 2019-01-04 ENCOUNTER — Ambulatory Visit
Admission: RE | Admit: 2019-01-04 | Discharge: 2019-01-04 | Disposition: A | Discharge status: Home / Self Care | Payer: Medicare HMO | Source: Ambulatory Visit | Attending: Radiation Oncology | Admitting: Radiation Oncology

## 2019-01-04 ENCOUNTER — Other Ambulatory Visit: Payer: Self-pay

## 2019-01-04 ENCOUNTER — Inpatient Hospital Stay: Payer: Medicare HMO

## 2019-01-04 VITALS — BP 143/97 | HR 112 | Temp 98.7°F | Resp 20

## 2019-01-04 DIAGNOSIS — Z79890 Hormone replacement therapy (postmenopausal): Secondary | ICD-10-CM | POA: Diagnosis not present

## 2019-01-04 DIAGNOSIS — Z7902 Long term (current) use of antithrombotics/antiplatelets: Secondary | ICD-10-CM | POA: Diagnosis not present

## 2019-01-04 DIAGNOSIS — C321 Malignant neoplasm of supraglottis: Secondary | ICD-10-CM

## 2019-01-04 DIAGNOSIS — Z95828 Presence of other vascular implants and grafts: Secondary | ICD-10-CM

## 2019-01-04 DIAGNOSIS — T451X5A Adverse effect of antineoplastic and immunosuppressive drugs, initial encounter: Secondary | ICD-10-CM | POA: Diagnosis not present

## 2019-01-04 DIAGNOSIS — R634 Abnormal weight loss: Secondary | ICD-10-CM | POA: Diagnosis not present

## 2019-01-04 DIAGNOSIS — Z951 Presence of aortocoronary bypass graft: Secondary | ICD-10-CM | POA: Diagnosis not present

## 2019-01-04 DIAGNOSIS — E46 Unspecified protein-calorie malnutrition: Secondary | ICD-10-CM | POA: Diagnosis not present

## 2019-01-04 DIAGNOSIS — R112 Nausea with vomiting, unspecified: Secondary | ICD-10-CM | POA: Diagnosis not present

## 2019-01-04 DIAGNOSIS — Z5111 Encounter for antineoplastic chemotherapy: Secondary | ICD-10-CM | POA: Diagnosis not present

## 2019-01-04 DIAGNOSIS — Z510 Encounter for antineoplastic radiation therapy: Secondary | ICD-10-CM | POA: Diagnosis not present

## 2019-01-04 DIAGNOSIS — Z87891 Personal history of nicotine dependence: Secondary | ICD-10-CM | POA: Diagnosis not present

## 2019-01-04 DIAGNOSIS — D701 Agranulocytosis secondary to cancer chemotherapy: Secondary | ICD-10-CM | POA: Diagnosis not present

## 2019-01-04 DIAGNOSIS — Z79899 Other long term (current) drug therapy: Secondary | ICD-10-CM | POA: Diagnosis not present

## 2019-01-04 DIAGNOSIS — E890 Postprocedural hypothyroidism: Secondary | ICD-10-CM | POA: Diagnosis not present

## 2019-01-04 DIAGNOSIS — I10 Essential (primary) hypertension: Secondary | ICD-10-CM | POA: Diagnosis not present

## 2019-01-04 MED ORDER — ONDANSETRON HCL 4 MG/2ML IJ SOLN: 8.0000 mg | Freq: Once | INTRAMUSCULAR | Status: DC

## 2019-01-04 MED ORDER — SODIUM CHLORIDE 0.9 % IV SOLN
Freq: Once | INTRAVENOUS | Status: AC
  Administered 2019-01-04: 16:00:00 via INTRAVENOUS
  Filled 2019-01-04: qty 1000

## 2019-01-04 MED ORDER — SODIUM CHLORIDE 0.9% FLUSH
10.0000 mL | Freq: Once | INTRAVENOUS | Status: AC | PRN
  Administered 2019-01-04: 18:00:00 10 mL
  Filled 2019-01-04: qty 10

## 2019-01-04 MED ORDER — PROCHLORPERAZINE EDISYLATE 10 MG/2ML IJ SOLN
10.0000 mg | Freq: Once | INTRAMUSCULAR | Status: AC
  Administered 2019-01-04: 10 mg via INTRAVENOUS
  Filled 2019-01-04: qty 2

## 2019-01-04 MED ORDER — PROCHLORPERAZINE EDISYLATE 10 MG/2ML IJ SOLN
INTRAMUSCULAR | Status: AC
  Filled 2019-01-04: qty 2

## 2019-01-04 MED ORDER — ONDANSETRON HCL 4 MG/2ML IJ SOLN
INTRAMUSCULAR | Status: AC
  Filled 2019-01-04: qty 4

## 2019-01-04 MED ORDER — SODIUM CHLORIDE 0.9 % IV SOLN
Freq: Once | INTRAVENOUS | Status: AC
  Administered 2019-01-04: 15:00:00 via INTRAVENOUS
  Filled 2019-01-04: qty 250

## 2019-01-04 MED ORDER — HEPARIN SOD (PORK) LOCK FLUSH 100 UNIT/ML IV SOLN
500.0000 [IU] | Freq: Once | INTRAVENOUS | Status: AC | PRN
  Administered 2019-01-04: 500 [IU]
  Filled 2019-01-04: qty 5

## 2019-01-04 NOTE — Patient Instructions (Signed)
Magnesium Sulfate injection What is this medicine? MAGNESIUM SULFATE (mag NEE zee um SUL fate) is an electrolyte injection commonly used to treat low magnesium levels in your blood. It is also used to prevent or control seizures in women with preeclampsia or eclampsia. This medicine may be used for other purposes; ask your health care provider or pharmacist if you have questions. What should I tell my health care provider before I take this medicine? They need to know if you have any of these conditions: -heart disease -history of irregular heart beat -kidney disease -an unusual or allergic reaction to magnesium sulfate, medicines, foods, dyes, or preservatives -pregnant or trying to get pregnant -breast-feeding How should I use this medicine? This medicine is for infusion into a vein. It is given by a health care professional in a hospital or clinic setting. Talk to your pediatrician regarding the use of this medicine in children. While this drug may be prescribed for selected conditions, precautions do apply. Overdosage: If you think you have taken too much of this medicine contact a poison control center or emergency room at once. NOTE: This medicine is only for you. Do not share this medicine with others. What if I miss a dose? This does not apply. What may interact with this medicine? This medicine may interact with the following medications: -certain medicines for anxiety or sleep -certain medicines for seizures like phenobarbital -digoxin -medicines that relax muscles for surgery -narcotic medicines for pain This list may not describe all possible interactions. Give your health care provider a list of all the medicines, herbs, non-prescription drugs, or dietary supplements you use. Also tell them if you smoke, drink alcohol, or use illegal drugs. Some items may interact with your medicine. What should I watch for while using this medicine? Your condition will be monitored carefully  while you are receiving this medicine. You may need blood work done while you are receiving this medicine. What side effects may I notice from receiving this medicine? Side effects that you should report to your doctor or health care professional as soon as possible: -allergic reactions like skin rash, itching or hives, swelling of the face, lips, or tongue -facial flushing -muscle weakness -signs and symptoms of low blood pressure like dizziness; feeling faint or lightheaded, falls; unusually weak or tired -signs and symptoms of a dangerous change in heartbeat or heart rhythm like chest pain; dizziness; fast or irregular heartbeat; palpitations; breathing problems -sweating This list may not describe all possible side effects. Call your doctor for medical advice about side effects. You may report side effects to FDA at 1-800-FDA-1088. Where should I keep my medicine? This drug is given in a hospital or clinic and will not be stored at home. NOTE: This sheet is a summary. It may not cover all possible information. If you have questions about this medicine, talk to your doctor, pharmacist, or health care provider.  2019 Elsevier/Gold Standard (2016-02-03 12:31:42)   

## 2019-01-07 ENCOUNTER — Encounter: Payer: Self-pay | Admitting: *Deleted

## 2019-01-07 ENCOUNTER — Other Ambulatory Visit: Payer: Self-pay

## 2019-01-07 ENCOUNTER — Ambulatory Visit
Admission: RE | Admit: 2019-01-07 | Discharge: 2019-01-07 | Disposition: A | Discharge status: Home / Self Care | Payer: Medicare HMO | Source: Ambulatory Visit | Attending: Radiation Oncology | Admitting: Radiation Oncology

## 2019-01-07 ENCOUNTER — Inpatient Hospital Stay: Payer: Medicare HMO

## 2019-01-07 VITALS — BP 134/94 | HR 113 | Temp 98.5°F | Resp 20

## 2019-01-07 DIAGNOSIS — E890 Postprocedural hypothyroidism: Secondary | ICD-10-CM | POA: Diagnosis not present

## 2019-01-07 DIAGNOSIS — D701 Agranulocytosis secondary to cancer chemotherapy: Secondary | ICD-10-CM | POA: Diagnosis not present

## 2019-01-07 DIAGNOSIS — E46 Unspecified protein-calorie malnutrition: Secondary | ICD-10-CM | POA: Diagnosis not present

## 2019-01-07 DIAGNOSIS — Z951 Presence of aortocoronary bypass graft: Secondary | ICD-10-CM | POA: Diagnosis not present

## 2019-01-07 DIAGNOSIS — Z79890 Hormone replacement therapy (postmenopausal): Secondary | ICD-10-CM | POA: Diagnosis not present

## 2019-01-07 DIAGNOSIS — Z510 Encounter for antineoplastic radiation therapy: Secondary | ICD-10-CM | POA: Diagnosis not present

## 2019-01-07 DIAGNOSIS — Z79899 Other long term (current) drug therapy: Secondary | ICD-10-CM | POA: Diagnosis not present

## 2019-01-07 DIAGNOSIS — I10 Essential (primary) hypertension: Secondary | ICD-10-CM | POA: Diagnosis not present

## 2019-01-07 DIAGNOSIS — R112 Nausea with vomiting, unspecified: Secondary | ICD-10-CM | POA: Diagnosis not present

## 2019-01-07 DIAGNOSIS — Z87891 Personal history of nicotine dependence: Secondary | ICD-10-CM | POA: Diagnosis not present

## 2019-01-07 DIAGNOSIS — R634 Abnormal weight loss: Secondary | ICD-10-CM | POA: Diagnosis not present

## 2019-01-07 DIAGNOSIS — Z7902 Long term (current) use of antithrombotics/antiplatelets: Secondary | ICD-10-CM | POA: Diagnosis not present

## 2019-01-07 DIAGNOSIS — Z95828 Presence of other vascular implants and grafts: Secondary | ICD-10-CM

## 2019-01-07 DIAGNOSIS — C321 Malignant neoplasm of supraglottis: Secondary | ICD-10-CM

## 2019-01-07 DIAGNOSIS — Z5111 Encounter for antineoplastic chemotherapy: Secondary | ICD-10-CM | POA: Diagnosis not present

## 2019-01-07 DIAGNOSIS — T451X5A Adverse effect of antineoplastic and immunosuppressive drugs, initial encounter: Secondary | ICD-10-CM | POA: Diagnosis not present

## 2019-01-07 MED ORDER — SODIUM CHLORIDE 0.9 % IV SOLN
Freq: Once | INTRAVENOUS | Status: AC
  Administered 2019-01-07: 16:00:00 via INTRAVENOUS
  Filled 2019-01-07: qty 250

## 2019-01-07 MED ORDER — SODIUM CHLORIDE 0.9% FLUSH
10.0000 mL | Freq: Once | INTRAVENOUS | Status: AC | PRN
  Administered 2019-01-07: 10 mL
  Filled 2019-01-07: qty 10

## 2019-01-07 MED ORDER — HEPARIN SOD (PORK) LOCK FLUSH 100 UNIT/ML IV SOLN
500.0000 [IU] | Freq: Once | INTRAVENOUS | Status: AC | PRN
  Administered 2019-01-07: 500 [IU]
  Filled 2019-01-07: qty 5

## 2019-01-07 NOTE — Progress Notes (Signed)
A user error has taken place: encounter opened in error, closed for administrative reasons.

## 2019-01-07 NOTE — Progress Notes (Signed)
Oncology Nurse Navigator Documentation  Met with Gloria Murray following RT to check on her well-being.  She reported "doing OK", drinking nutritional supplement.  She stated friend who can stay with her s/p g-tube placement is better now.  She recognized final tmts not until end of this month and that swallowing my become increasingly more difficult.  She insisted she does not want g-tube at this time.  I encouraged her to notify me or Drs. Squire/Zhao if she changes her mind.   I encouraged her to call me with needs/concerns.  Gayleen Orem, RN, BSN Head & Neck Oncology Nurse [facility] [place] [phone]

## 2019-01-07 NOTE — Patient Instructions (Signed)
Dehydration, Adult  Dehydration is when there is not enough fluid or water in your body. This happens when you lose more fluids than you take in. Dehydration can range from mild to very bad. It should be treated right away to keep it from getting very bad. Symptoms of mild dehydration may include:  Thirst.  Dry lips.  Slightly dry mouth.  Dry, warm skin.  Dizziness. Symptoms of moderate dehydration may include:  Very dry mouth.  Muscle cramps.  Dark pee (urine). Pee may be the color of tea.  Your body making less pee.  Your eyes making fewer tears.  Heartbeat that is uneven or faster than normal (palpitations).  Headache.  Light-headedness, especially when you stand up from sitting.  Fainting (syncope). Symptoms of very bad dehydration may include:  Changes in skin, such as: ? Cold and clammy skin. ? Blotchy (mottled) or pale skin. ? Skin that does not quickly return to normal after being lightly pinched and let go (poor skin turgor).  Changes in body fluids, such as: ? Feeling very thirsty. ? Your eyes making fewer tears. ? Not sweating when body temperature is high, such as in hot weather. ? Your body making very little pee.  Changes in vital signs, such as: ? Weak pulse. ? Pulse that is more than 100 beats a minute when you are sitting still. ? Fast breathing. ? Low blood pressure.  Other changes, such as: ? Sunken eyes. ? Cold hands and feet. ? Confusion. ? Lack of energy (lethargy). ? Trouble waking up from sleep. ? Short-term weight loss. ? Unconsciousness. Follow these instructions at home:   If told by your doctor, drink an ORS: ? Make an ORS by using instructions on the package. ? Start by drinking small amounts, about  cup (120 mL) every 5-10 minutes. ? Slowly drink more until you have had the amount that your doctor said to have.  Drink enough clear fluid to keep your pee clear or pale yellow. If you were told to drink an ORS, finish the  ORS first, then start slowly drinking clear fluids. Drink fluids such as: ? Water. Do not drink only water by itself. Doing that can make the salt (sodium) level in your body get too low (hyponatremia). ? Ice chips. ? Fruit juice that you have added water to (diluted). ? Low-calorie sports drinks.  Avoid: ? Alcohol. ? Drinks that have a lot of sugar. These include high-calorie sports drinks, fruit juice that does not have water added, and soda. ? Caffeine. ? Foods that are greasy or have a lot of fat or sugar.  Take over-the-counter and prescription medicines only as told by your doctor.  Do not take salt tablets. Doing that can make the salt level in your body get too high (hypernatremia).  Eat foods that have minerals (electrolytes). Examples include bananas, oranges, potatoes, tomatoes, and spinach.  Keep all follow-up visits as told by your doctor. This is important. Contact a doctor if:  You have belly (abdominal) pain that: ? Gets worse. ? Stays in one area (localizes).  You have a rash.  You have a stiff neck.  You get angry or annoyed more easily than normal (irritability).  You are more sleepy than normal.  You have a harder time waking up than normal.  You feel: ? Weak. ? Dizzy. ? Very thirsty.  You have peed (urinated) only a small amount of very dark pee during 6-8 hours. Get help right away if:  You have   symptoms of very bad dehydration.  You cannot drink fluids without throwing up (vomiting).  Your symptoms get worse with treatment.  You have a fever.  You have a very bad headache.  You are throwing up or having watery poop (diarrhea) and it: ? Gets worse. ? Does not go away.  You have blood or something green (bile) in your throw-up.  You have blood in your poop (stool). This may cause poop to look black and tarry.  You have not peed in 6-8 hours.  You pass out (faint).  Your heart rate when you are sitting still is more than 100 beats a  minute.  You have trouble breathing. This information is not intended to replace advice given to you by your health care provider. Make sure you discuss any questions you have with your health care provider. Document Released: 05/14/2009 Document Revised: 02/05/2016 Document Reviewed: 09/11/2015 Elsevier Interactive Patient Education  2019 Elsevier Inc.  

## 2019-01-08 ENCOUNTER — Other Ambulatory Visit: Payer: Self-pay | Admitting: Hematology

## 2019-01-08 ENCOUNTER — Inpatient Hospital Stay: Payer: Medicare HMO | Admitting: Nutrition

## 2019-01-08 ENCOUNTER — Other Ambulatory Visit: Payer: Self-pay

## 2019-01-08 ENCOUNTER — Inpatient Hospital Stay: Payer: Medicare HMO

## 2019-01-08 ENCOUNTER — Ambulatory Visit
Admission: RE | Admit: 2019-01-08 | Discharge: 2019-01-08 | Disposition: A | Discharge status: Home / Self Care | Payer: Medicare HMO | Source: Ambulatory Visit | Attending: Radiation Oncology | Admitting: Radiation Oncology

## 2019-01-08 VITALS — BP 106/72 | HR 96 | Temp 98.1°F | Resp 16

## 2019-01-08 DIAGNOSIS — C321 Malignant neoplasm of supraglottis: Secondary | ICD-10-CM

## 2019-01-08 DIAGNOSIS — R634 Abnormal weight loss: Secondary | ICD-10-CM | POA: Diagnosis not present

## 2019-01-08 DIAGNOSIS — E890 Postprocedural hypothyroidism: Secondary | ICD-10-CM | POA: Diagnosis not present

## 2019-01-08 DIAGNOSIS — Z95828 Presence of other vascular implants and grafts: Secondary | ICD-10-CM

## 2019-01-08 DIAGNOSIS — Z7902 Long term (current) use of antithrombotics/antiplatelets: Secondary | ICD-10-CM | POA: Diagnosis not present

## 2019-01-08 DIAGNOSIS — D701 Agranulocytosis secondary to cancer chemotherapy: Secondary | ICD-10-CM | POA: Diagnosis not present

## 2019-01-08 DIAGNOSIS — Z510 Encounter for antineoplastic radiation therapy: Secondary | ICD-10-CM | POA: Diagnosis not present

## 2019-01-08 DIAGNOSIS — Z87891 Personal history of nicotine dependence: Secondary | ICD-10-CM | POA: Diagnosis not present

## 2019-01-08 DIAGNOSIS — Z79899 Other long term (current) drug therapy: Secondary | ICD-10-CM | POA: Diagnosis not present

## 2019-01-08 DIAGNOSIS — Z951 Presence of aortocoronary bypass graft: Secondary | ICD-10-CM | POA: Diagnosis not present

## 2019-01-08 DIAGNOSIS — I10 Essential (primary) hypertension: Secondary | ICD-10-CM | POA: Diagnosis not present

## 2019-01-08 DIAGNOSIS — E46 Unspecified protein-calorie malnutrition: Secondary | ICD-10-CM | POA: Diagnosis not present

## 2019-01-08 DIAGNOSIS — T451X5A Adverse effect of antineoplastic and immunosuppressive drugs, initial encounter: Secondary | ICD-10-CM | POA: Diagnosis not present

## 2019-01-08 DIAGNOSIS — Z5111 Encounter for antineoplastic chemotherapy: Secondary | ICD-10-CM | POA: Diagnosis not present

## 2019-01-08 DIAGNOSIS — R112 Nausea with vomiting, unspecified: Secondary | ICD-10-CM | POA: Diagnosis not present

## 2019-01-08 DIAGNOSIS — Z79890 Hormone replacement therapy (postmenopausal): Secondary | ICD-10-CM | POA: Diagnosis not present

## 2019-01-08 MED ORDER — SODIUM CHLORIDE 0.9% FLUSH
10.0000 mL | Freq: Once | INTRAVENOUS | Status: AC
  Administered 2019-01-08: 17:00:00 10 mL
  Filled 2019-01-08: qty 10

## 2019-01-08 MED ORDER — HEPARIN SOD (PORK) LOCK FLUSH 100 UNIT/ML IV SOLN
500.0000 [IU] | Freq: Once | INTRAVENOUS | Status: AC
  Administered 2019-01-08: 17:00:00 500 [IU]
  Filled 2019-01-08: qty 5

## 2019-01-08 MED ORDER — SODIUM CHLORIDE 0.9 % IV SOLN
Freq: Once | INTRAVENOUS | Status: AC
  Administered 2019-01-08: 15:00:00 via INTRAVENOUS
  Filled 2019-01-08: qty 250

## 2019-01-08 NOTE — Progress Notes (Signed)
RD working remotely.  Nutrition follow up completed with patient receiving treatment for Larynx cancer. Weight decreased and documented as 197.1 pounds June 3. States she likes the chocolate and strawberry flavors of Ensure Enlive better than Vanilla. Denies eating less and doesn't know why she has lost weight. Denies vomiting but admits to a little nausea.  Nutrition Diagnosis: Unintended Weight Loss continues  Intervention: Educated patient to increase calories and protein in 6 meals/snacks. Increase ONS to 3 daily. Educated patient on flavoring tips. Encouraged anti-nausea medications as needed.  Monitoring, Evaluation, Goals: Patient will increase calories and protein to minimize further weight loss.  Next Visit: Tuesday, June 16.

## 2019-01-08 NOTE — Patient Instructions (Signed)

## 2019-01-09 ENCOUNTER — Inpatient Hospital Stay: Payer: Medicare HMO

## 2019-01-09 ENCOUNTER — Ambulatory Visit: Payer: Medicare HMO

## 2019-01-09 ENCOUNTER — Inpatient Hospital Stay (HOSPITAL_BASED_OUTPATIENT_CLINIC_OR_DEPARTMENT_OTHER): Payer: Medicare HMO | Admitting: Hematology

## 2019-01-09 ENCOUNTER — Encounter: Payer: Self-pay | Admitting: Hematology

## 2019-01-09 ENCOUNTER — Telehealth: Payer: Self-pay | Admitting: *Deleted

## 2019-01-09 DIAGNOSIS — C321 Malignant neoplasm of supraglottis: Secondary | ICD-10-CM

## 2019-01-09 DIAGNOSIS — Z87891 Personal history of nicotine dependence: Secondary | ICD-10-CM | POA: Diagnosis not present

## 2019-01-09 DIAGNOSIS — E46 Unspecified protein-calorie malnutrition: Secondary | ICD-10-CM | POA: Diagnosis not present

## 2019-01-09 DIAGNOSIS — R112 Nausea with vomiting, unspecified: Secondary | ICD-10-CM | POA: Diagnosis not present

## 2019-01-09 DIAGNOSIS — D701 Agranulocytosis secondary to cancer chemotherapy: Secondary | ICD-10-CM

## 2019-01-09 DIAGNOSIS — T451X5A Adverse effect of antineoplastic and immunosuppressive drugs, initial encounter: Secondary | ICD-10-CM

## 2019-01-09 DIAGNOSIS — Z79899 Other long term (current) drug therapy: Secondary | ICD-10-CM | POA: Diagnosis not present

## 2019-01-09 DIAGNOSIS — Z7982 Long term (current) use of aspirin: Secondary | ICD-10-CM | POA: Diagnosis not present

## 2019-01-09 NOTE — Progress Notes (Signed)
[facility]  HEMATOLOGY-ONCOLOGY TeleHEALTH VISIT PROGRESS NOTE   I connected with Gloria Murray on 01/09/19 at  4:00 PM EDT by telephone visit and verified that I am speaking with the correct person using two identifiers.   I discussed the limitations, risks, security and privacy concerns of performing an evaluation and management service by telemedicine and the availability of in-person appointments. I also discussed with the patient that there may be a patient responsible charge related to this service. The patient expressed understanding and agreed to proceed.   Other persons participating in the visit and their role in the encounter: NA   Patient's location: home  Provider's location: clinic  Patient Care Team: Jearld Fenton, NP as PCP - General (Internal Medicine) Irene Shipper, MD as Consulting Physician (Gastroenterology) Early, Arvilla Meres, MD as Consulting Physician (Vascular Surgery) Serafina Mitchell, MD as Consulting Physician (Vascular Surgery) Eppie Gibson, MD as Attending Physician (Radiation Oncology) Leota Sauers, RN as Oncology Nurse Navigator (Oncology) Karie Mainland, RD as Dietitian (Nutrition) Sharen Counter, CCC-SLP as Speech Language Pathologist (Speech Pathology) Kennith Center, LCSW as Social Worker  HEME/ONC OVERVIEW: 1. 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 R Level II LN 5 x 3.3cm; L Level II LN 5.4 x 2.7cm w/ suspicion for ECE) -10/2018: DL with bx by Dr. Redmond Baseman showed a mass involving the left aryepiglottic fold extending up to the epiglottic and BOT, bx showed invasive squamous cell carcinoma; PET showed FDG-avid supraglottic malignancy and bilateral bulky FDG-avid Level II and III cervical LN's, no mets  -Late 10/2018 - present: definitive chemoradiation with weekly carboplatin    2. Port in  11/2018; feeding tube deferred due to lack of social support    TREATMENT REGIMEN:  12/10/2018 - present: definitive chemoradiation with weekly carboplatin    ASSESSMENT & PLAN:    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 -S/p 4 cycles of weekly carboplatin concurrent with RT in the definitive setting -Labs and Cycle 5 of carboplatin scheduled on 01/10/2019 -Patient declined feeding tube due to lack of social support  -We will re-visit feeding tube if patient's nutrition or hydration status worsens significantly  -PRN anti-emetics: Zofran, Compazine, Ativan and dexamethasone   Chemotherapy-associated leukopenia -Secondary to chemotherapy -WBC slowly downtrending, but her [place] has been adequate -Labs pending on 01/10/2019  -Patient denies any symptoms of infection -We will monitor for now; no indication for dose adjustment   Hypomagnesemia -Secondary to electrolyte wasting from chemotherapy -Mg periodically low, for which she has been receiving IV Mg repletion -Given the decreased PO intake, I have ordered 1L NS with 4g IV Mg to be administered with chemotherapy on 01/10/2019   Chemotherapy-associated nausea  -Secondary to chemotherapy -Symptoms relatively well controlled  -Continue PRN-anti-emetics    Protein malnutrition -Secondary to chemoradiation -Weight down by 2 lbs over the past week -She is currently drinking only 1 bottle of water per day with some tea; she is also able to eat sandwiches -I counseled the patient on the importance of maintaining adequate nutrition and hydration (goal 3-4 bottles of 16oz water per day) -If her weight continues to drop precipitously, then we will have to revisit the feeding tube  -Given the limited fluid intake, I have added 1L NS with IV Mg for 01/10/2019  No orders of the defined types were placed in this encounter.  I discussed the assessment and treatment plan with the patient. The patient was provided an  opportunity to ask questions and all were answered. The patient agreed with the plan and demonstrated an understanding of the instructions.   The patient was advised to call back or seek an in-person evaluation if the symptoms worsen or if the condition fails to improve as anticipated.   I provided 25 minutes of non face-to-face telephone visit time during this encounter, and > 50% was spent counseling as documented under my assessment & plan.   Return in 1 week for labs, port flush, clinic appt and Cycle 6 of carboplatin.   Gloria Men, MD 01/09/2019 4:22 PM   CHIEF COMPLAINTS: "I overslept"  INTERVAL HISTORY:  Gloria Murray was scheduled for routine clinic follow-up for treatment of squamous cell carcinoma of the supraglottic larynx on definitive chemoradiation.  However, patient apparently overslept and missed her appointment, so I conducted the clinic visit over the phone.  Patient reports that she has been eating relatively normal, including sandwiches, water and tea, she only drinks about 1 bottle of 16 ounce water per day, as well as some tea.  She denies any significant difficulty with eating or drinking.  She has some occasional nausea, but has not had any vomiting.  She reports losing about 2 pounds over the past week.  She otherwise denies any other complaint today.  I have reviewed her chart and materials related to her cancer extensively and collaborated history with the patient. Summary of oncologic history is as follows:   Malignant neoplasm of supraglottis ([place])   10/26/2018 Imaging    CT neck w/ contrast: IMPRESSION: 1. Bulky, partially necrotic bilateral cervical lymphadenopathy most concerning for metastatic disease, less likely unusual infection. 2. Prominent soft tissue at the tongue base with asymmetric left aryepiglottic fold thickening and slight fullness of the left palatine tonsil. Direct visualization is recommended to assess for a primary mucosal malignancy. 3.  Aortic  Atherosclerosis (ICD10-I70.0).    11/08/2018 Procedure    DL with biopsy    11/08/2018 Pathology Results    Accession: [id number] 7. Larynx, biopsy, Left Supraglottis - INVASIVE SQUAMOUS CELL CARCINOMA. 2. Larynx, biopsy, Left Ary Epiglottic Fold - INVASIVE SQUAMOUS CELL CARCINOMA. 3. Tongue, biopsy, Left Vallecular Base - INVASIVE SQUAMOUS CELL CARCINOMA. 4. Larynx, biopsy, Right Vallecular Epiglottis - INVASIVE SQUAMOUS CELL CARCINOMA.    11/16/2018 Initial Diagnosis    Laryngeal cancer ([place])    11/23/2018 Imaging    PET: IMPRESSION: 1. Intensely hypermetabolic supraglottic mass predominantly involving the LEFT aryepiglottic fold but extending across midline anteriorly. Inferiorly hypermetabolic activity extends to the level of the larynx on the LEFT. 2. Bilateral bulky intensely hypermetabolic metastatic level II lymph nodes. Metastatic adenopathy extends with smaller nodes in the level III nodal stations bilaterally. 3. No evidence of thoracic metastasis. 4. Enlarged LEFT adrenal gland consistent with hyperplasia or lipid poor adenoma.    11/27/2018 Cancer Staging    Staging form: Larynx - Supraglottis, AJCC 8th Edition - Clinical stage from 11/27/2018: Stage IVB (cT2, cN3b, cM0) - Signed by Eppie Gibson, MD on 11/27/2018    12/13/2018 -  Chemotherapy    The patient had palonosetron (ALOXI) injection 0.25 mg, 0.25 mg, Intravenous,  Once, 4 of 7 cycles Administration: 0.25 mg (12/13/2018), 0.25 mg (12/20/2018), 0.25 mg (12/27/2018), 0.25 mg (01/03/2019) CARBOplatin (PARAPLATIN) 210 mg in sodium chloride 0.9 % 250 mL chemo infusion, 210 mg (100 % of original dose 213.8 mg), Intravenous,  Once, 4 of 7  cycles Dose modification:   (original dose 213.8 mg, Cycle 1) Administration: 210 mg (12/13/2018), 210 mg (12/20/2018), 210 mg (12/27/2018), 210 mg (01/03/2019)  for chemotherapy treatment.      MEDICAL HISTORY:  Past Medical History:  Diagnosis Date  . Allergy   . Carotid artery  occlusion   . Heart murmur   . Hyperlipidemia   . Hypertension   . Hypothyroidism   . Laryngeal mass   . Osteoporosis   . Thyroid disease   . Wears glasses   . Wears upper complete and lower partial dentures     SURGICAL HISTORY: Past Surgical History:  Procedure Laterality Date  . ARCH AORTOGRAM N/A 07/23/2014   Procedure: ARCH AORTOGRAM;  Surgeon: Rosetta Posner, MD;  Location[facility] C[facility] LAB;  Service: Cardiovascular;  Laterality: N/A;  . CAROTID ANGIOGRAM N/A 07/23/2014   Procedure: CAROTID ANGIOGRAM;  Surgeon: Rosetta Posner, MD;  Location: [facility] CATH LAB;  Service: Cardiovascular;  Laterality: N/A;  . CAROTID ENDARTERECTOMY  12/11/2008   left  . CAROTID STENT INSERTION Left 10/14/2014   Procedure: CAROTID STENT INSERTION;  Surgeon: Serafina Mitchell, MD;  Location: [facility] CATH LAB;  Service: Cardiovascular;  Laterality: Left;   carotid  . CARPAL TUNNEL RELEASE     X 2, right wrist  . COLONOSCOPY  Jan. 2, 2015  . DILATION AND CURETTAGE OF UTERUS     X 2  . IR IMAGING GUIDED PORT INSERTION  12/06/2018  . laceration hand Right    hand  . LARYNGOSCOPY N/A 11/08/2018   Procedure: DIRECT MICROLARYNGOSCOPY WITH BIOPSY;  Surgeon: Melida Quitter, MD;  Location: [facility];  Service: ENT;  Laterality: N/A;  . RIGID ESOPHAGOSCOPY N/A 11/08/2018   Procedure: ESOPHAGOSCOPY;  Surgeon: Melida Quitter, MD;  Location: [facility];  Service: ENT;  Laterality: N/A;  . THYROIDECTOMY    . TUBAL LIGATION  1975   bilateral    SOCIAL HISTORY: Social History   Socioeconomic History  . Marital status: Widowed    Spouse name: Not on file  . Number of children: Not on file  . Years of education: Not on file  . Highest education level: Not on file  Occupational History  . Occupation: Retail buyer: [employer]  . Financial resource strain: Not on file  . Food insecurity:    Worry: Not on file    Inability: Not on file  . Transportation needs:    Medical: No    Non-medical: No  Tobacco Use  .  Smoking status: Former Smoker    Packs/day: 0.50    Types: Cigarettes    Last attempt to quit: 07/23/2014    Years since quitting: 4.4  . Smokeless tobacco: Never Used  . Tobacco comment: quit smoking cigarettes in " 2018-19"  Substance and Sexual Activity  . Alcohol use: No    Alcohol/week: 0.0 standard drinks  . Drug use: No  . Sexual activity: Never  Lifestyle  . Physical activity:    Days per week: Not on file    Minutes per session: Not on file  . Stress: Not on file  Relationships  . Social connections:    Talks on phone: Not on file    Gets together: Not on file    Attends religious service: Not on file    Active member of club or organization: Not on file    Attends meetings of clubs or organizations: Not on file    Relationship status: Not  on file  . Intimate partner violence:    Fear of current or ex partner: No    Emotionally abused: No    Physically abused: No    Forced sexual activity: No  Other Topics Concern  . Not on file  Social History Narrative   Does not have a living will.   Does not have any family.      [place] "make decisions" for her-desires CPR, does not want prolonged life support if futile.      Both of her children- died in car accident at ages 43 and 73- husband went into a diabetic coma while driving.    FAMILY HISTORY: Family History  Problem Relation Age of Onset  . Diabetes Mother   . Colon cancer Neg Hx     ALLERGIES:  is allergic to propoxyphene hcl and propoxyphene n-acetaminophen.  MEDICATIONS:  Current Outpatient Medications  Medication Sig Dispense Refill  . Ascorbic Acid (VITAMIN C PO) Take 1 tablet by mouth daily.    Marland Kitchen aspirin EC 81 MG tablet Take 81 mg by mouth daily.    Marland Kitchen CALCIUM PO Take 1 tablet by mouth daily.    . Cholecalciferol (VITAMIN D3 PO) Take 1 tablet by mouth daily.    . clopidogrel (PLAVIX) 75 MG tablet TAKE 1 TABLET (75 MG TOTAL) BY MOUTH DAILY. 90 tablet 3  . dexamethasone (DECADRON) 4 MG  tablet Take 2 tablets (8 mg total) by mouth daily. Start the day after chemotherapy for 2 days. Take with food. (Patient not taking: Reported on 12/26/2018) 30 tablet 1  . fexofenadine (ALLEGRA) 180 MG tablet Take 180 mg by mouth at bedtime.     . fluticasone (FLONASE) 50 MCG/ACT nasal spray Place 1 spray into both nostrils daily.     . Garlic 7858 MG CAPS Take 1 capsule by mouth 2 (two) times daily.      . hydrochlorothiazide (MICROZIDE) 12.5 MG capsule TAKE 1 CAPSULE EVERY DAY 90 capsule 0  . levothyroxine (SYNTHROID) 112 MCG tablet TAKE 1 TABLET EVERY DAY 90 tablet 0  . lidocaine (XYLOCAINE) 2 % solution Patient: Mix 1part 2% viscous lidocaine, 1part H20. Swish & swallow 7mL of diluted mixture, 42min before meals and at bedtime, up to QID 100 mL 5  . lidocaine-prilocaine (EMLA) cream Apply to affected area once 30 g 3  . LORazepam (ATIVAN) 0.5 MG tablet Take 1 tablet (0.5 mg total) by mouth every 6 (six) hours as needed (Nausea or vomiting). 30 tablet 0  . magnesium oxide (MAG-OX) 400 (241.3 Mg) MG tablet Take 1 tablet (400 mg total) by mouth 2 (two) times daily for 30 days. Please mail to the patient 60 tablet 5  . metoprolol succinate (TOPROL-XL) 25 MG 24 hr tablet TAKE 1 TABLET EVERY DAY 90 tablet 0  . Omega-3 Fatty Acids (FISH OIL) 1000 MG CAPS Take 1 capsule (1,000 mg total) by mouth daily.    . ondansetron (ZOFRAN) 8 MG tablet Take 1 tablet (8 mg total) by mouth 2 (two) times daily as needed for refractory nausea / vomiting. Start on day 3 after chemotherapy. 30 tablet 1  . prochlorperazine (COMPAZINE) 10 MG tablet Take 1 tablet (10 mg total) by mouth every 6 (six) hours as needed (Nausea or vomiting). 30 tablet 1  . simvastatin (ZOCOR) 40 MG tablet TAKE 1 TABLET AT BEDTIME 90 tablet 0  . sodium fluoride (PREVIDENT 5000 PLUS) 1.1 % CREA dental cream Apply cream to tooth brush. Brush teeth for 2  minutes. Spit out excess. DO NOT rinse afterwards. Repeat nightly. 1 Tube prn  . traMADol (ULTRAM)  50 MG tablet Take 1 tablet (50 mg total) by mouth every 8 (eight) hours as needed. Mail to the patient please. Patient has [place] of $700.00. 60 tablet 1  . VITAMIN E PO Take 1 capsule by mouth daily.     No current facility-administered medications for this visit.     REVIEW OF SYSTEMS:   Constitutional: ( - ) fevers, ( - )  chills , ( - ) night sweats Eyes: ( - ) blurriness of vision, ( - ) double vision, ( - ) watery eyes Ears, nose, mouth, throat, and face: ( - ) mucositis, ( - ) sore throat Respiratory: ( - ) cough, ( - ) dyspnea, ( - ) wheezes Cardiovascular: ( - ) palpitation, ( - ) chest discomfort, ( - ) lower extremity swelling Gastrointestinal:  ( + ) nausea, ( - ) heartburn, ( - ) change in bowel habits Skin: ( - ) abnormal skin rashes Lymphatics: ( - ) new lymphadenopathy, ( - ) easy bruising Neurological: ( - ) numbness, ( - ) tingling, ( - ) new weaknesses Behavioral/Psych: ( - ) mood change, ( - ) new changes  All other systems were reviewed with the patient and are negative.  PHYSICAL EXAMINATION: Unable to examine the patient due to telephone visit.   LABORATORY DATA:  I have reviewed the data as listed Lab Results  Component Value Date   WBC 3.7 (L) 01/02/2019   HGB 14.4 01/02/2019   HCT 43.4 01/02/2019   MCV 93.1 01/02/2019   PLT 191 01/02/2019   Lab Results  Component Value Date   NA 140 01/02/2019   K 3.9 01/02/2019   CL 97 (L) 01/02/2019   CO2 30 01/02/2019   PATHOLOGY: I have reviewed the pathology reports as documented in the oncologist history.

## 2019-01-09 NOTE — Telephone Encounter (Signed)
Called pt and left message on voice mail informing pt of keeping chemo appt at  730 am for Thursday  01/10/19.

## 2019-01-09 NOTE — Progress Notes (Signed)
Spoke w/ Dr. Salem Senate, give 4 grams of IV magnesium in 1 L normal saline on 6/11. Order added to treatment plan.   Demetrius Charity, PharmD, [facility] Pharmacist Pharmacy Phone: [phone] 01/09/2019

## 2019-01-10 ENCOUNTER — Other Ambulatory Visit: Payer: Self-pay

## 2019-01-10 ENCOUNTER — Inpatient Hospital Stay: Payer: Medicare HMO

## 2019-01-10 ENCOUNTER — Ambulatory Visit
Admission: RE | Admit: 2019-01-10 | Discharge: 2019-01-10 | Disposition: A | Discharge status: Home / Self Care | Payer: Medicare HMO | Source: Ambulatory Visit | Attending: Radiation Oncology | Admitting: Radiation Oncology

## 2019-01-10 ENCOUNTER — Telehealth: Payer: Self-pay | Admitting: Hematology

## 2019-01-10 VITALS — BP 104/72 | HR 98 | Temp 99.1°F | Resp 16

## 2019-01-10 DIAGNOSIS — R112 Nausea with vomiting, unspecified: Secondary | ICD-10-CM | POA: Diagnosis not present

## 2019-01-10 DIAGNOSIS — C321 Malignant neoplasm of supraglottis: Secondary | ICD-10-CM

## 2019-01-10 DIAGNOSIS — R634 Abnormal weight loss: Secondary | ICD-10-CM | POA: Diagnosis not present

## 2019-01-10 DIAGNOSIS — Z79899 Other long term (current) drug therapy: Secondary | ICD-10-CM | POA: Diagnosis not present

## 2019-01-10 DIAGNOSIS — T451X5A Adverse effect of antineoplastic and immunosuppressive drugs, initial encounter: Secondary | ICD-10-CM | POA: Diagnosis not present

## 2019-01-10 DIAGNOSIS — Z7902 Long term (current) use of antithrombotics/antiplatelets: Secondary | ICD-10-CM | POA: Diagnosis not present

## 2019-01-10 DIAGNOSIS — I10 Essential (primary) hypertension: Secondary | ICD-10-CM | POA: Diagnosis not present

## 2019-01-10 DIAGNOSIS — Z79890 Hormone replacement therapy (postmenopausal): Secondary | ICD-10-CM | POA: Diagnosis not present

## 2019-01-10 DIAGNOSIS — E890 Postprocedural hypothyroidism: Secondary | ICD-10-CM | POA: Diagnosis not present

## 2019-01-10 DIAGNOSIS — E46 Unspecified protein-calorie malnutrition: Secondary | ICD-10-CM | POA: Diagnosis not present

## 2019-01-10 DIAGNOSIS — Z5111 Encounter for antineoplastic chemotherapy: Secondary | ICD-10-CM | POA: Diagnosis not present

## 2019-01-10 DIAGNOSIS — Z510 Encounter for antineoplastic radiation therapy: Secondary | ICD-10-CM | POA: Diagnosis not present

## 2019-01-10 DIAGNOSIS — D701 Agranulocytosis secondary to cancer chemotherapy: Secondary | ICD-10-CM | POA: Diagnosis not present

## 2019-01-10 DIAGNOSIS — Z87891 Personal history of nicotine dependence: Secondary | ICD-10-CM | POA: Diagnosis not present

## 2019-01-10 DIAGNOSIS — Z951 Presence of aortocoronary bypass graft: Secondary | ICD-10-CM | POA: Diagnosis not present

## 2019-01-10 LAB — BASIC METABOLIC PANEL - CANCER CENTER ONLY
Anion gap: 15 (ref 5–15)
BUN: 14 mg/dL (ref 8–23)
CO2: 28 mmol/L (ref 22–32)
Calcium: 9.8 mg/dL (ref 8.9–10.3)
Chloride: 97 mmol/L — ABNORMAL LOW (ref 98–111)
Creatinine: 0.73 mg/dL (ref 0.44–1.00)
GFR, Est AFR Am: >60 mL/min (ref >60)
GFR, Estimated: >60 mL/min (ref >60)
Glucose, Bld: 97 mg/dL (ref 70–99)
Potassium: 3.5 mmol/L (ref 3.5–5.1)
Sodium: 140 mmol/L (ref 135–145)

## 2019-01-10 LAB — CBC WITH DIFFERENTIAL (CANCER CENTER ONLY)
Abs Immature Granulocytes: 0.01 10*3/uL (ref 0.00–0.07)
Basophils Absolute: 0 10*3/uL (ref 0.0–0.1)
Basophils Relative: 1 %
Eosinophils Absolute: 0 10*3/uL (ref 0.0–0.5)
Eosinophils Relative: 0 %
HCT: 37.6 % (ref 36.0–46.0)
Hemoglobin: 12.9 g/dL (ref 12.0–15.0)
Immature Granulocytes: 0 %
Lymphocytes Relative: 11 %
Lymphs Abs: 0.3 10*3/uL — ABNORMAL LOW (ref 0.7–4.0)
MCH: 31.2 pg (ref 26.0–34.0)
MCHC: 34.3 g/dL (ref 30.0–36.0)
MCV: 91 fL (ref 80.0–100.0)
Monocytes Absolute: 0.2 10*3/uL (ref 0.1–1.0)
Monocytes Relative: 9 %
Neutro Abs: 1.9 10*3/uL (ref 1.7–7.7)
Neutrophils Relative %: 79 %
Platelet Count: 73 10*3/uL — ABNORMAL LOW (ref 150–400)
RBC: 4.13 MIL/uL (ref 3.87–5.11)
RDW: 13.6 % (ref 11.5–15.5)
WBC Count: 2.4 10*3/uL — ABNORMAL LOW (ref 4.0–10.5)
nRBC: 0 % (ref 0.0–0.2)

## 2019-01-10 LAB — MAGNESIUM: Magnesium: 1.5 mg/dL — ABNORMAL LOW (ref 1.7–2.4)

## 2019-01-10 MED ORDER — DEXAMETHASONE SODIUM PHOSPHATE 10 MG/ML IJ SOLN
10.0000 mg | Freq: Once | INTRAMUSCULAR | Status: AC
  Administered 2019-01-10: 10:00:00 10 mg via INTRAVENOUS

## 2019-01-10 MED ORDER — SODIUM CHLORIDE 0.9 % IV SOLN
Freq: Once | INTRAVENOUS | Status: AC
  Administered 2019-01-10: 09:00:00 via INTRAVENOUS
  Filled 2019-01-10: qty 250

## 2019-01-10 MED ORDER — PALONOSETRON HCL INJECTION 0.25 MG/5ML
0.2500 mg | Freq: Once | INTRAVENOUS | Status: AC
  Administered 2019-01-10: 0.25 mg via INTRAVENOUS

## 2019-01-10 MED ORDER — DEXAMETHASONE SODIUM PHOSPHATE 10 MG/ML IJ SOLN
INTRAMUSCULAR | Status: AC
  Filled 2019-01-10: qty 1

## 2019-01-10 MED ORDER — PALONOSETRON HCL INJECTION 0.25 MG/5ML
INTRAVENOUS | Status: AC
  Filled 2019-01-10: qty 5

## 2019-01-10 MED ORDER — HEPARIN SOD (PORK) LOCK FLUSH 100 UNIT/ML IV SOLN
500.0000 [IU] | Freq: Once | INTRAVENOUS | Status: AC | PRN
  Administered 2019-01-10: 500 [IU]
  Filled 2019-01-10: qty 5

## 2019-01-10 MED ORDER — SODIUM CHLORIDE 0.9 % IV SOLN
4.0000 g | Freq: Once | INTRAVENOUS | Status: AC
  Administered 2019-01-10: 10:00:00 4 g via INTRAVENOUS
  Filled 2019-01-10: qty 8

## 2019-01-10 MED ORDER — SODIUM CHLORIDE 0.9% FLUSH
10.0000 mL | INTRAVENOUS | Status: DC | PRN
  Administered 2019-01-10: 13:00:00 10 mL
  Filled 2019-01-10: qty 10

## 2019-01-10 MED ORDER — SODIUM CHLORIDE 0.9 % IV SOLN
213.8000 mg | Freq: Once | INTRAVENOUS | Status: AC
  Administered 2019-01-10: 210 mg via INTRAVENOUS
  Filled 2019-01-10: qty 21

## 2019-01-10 NOTE — Telephone Encounter (Signed)
Per 6/10 los No change

## 2019-01-10 NOTE — Patient Instructions (Signed)
Magnesium Sulfate injection What is this medicine? MAGNESIUM SULFATE (mag NEE zee um SUL fate) is an electrolyte injection commonly used to treat low magnesium levels in your blood. It is also used to prevent or control seizures in women with preeclampsia or eclampsia. This medicine may be used for other purposes; ask your health care provider or pharmacist if you have questions. What should I tell my health care provider before I take this medicine? They need to know if you have any of these conditions: -heart disease -history of irregular heart beat -kidney disease -an unusual or allergic reaction to magnesium sulfate, medicines, foods, dyes, or preservatives -pregnant or trying to get pregnant -breast-feeding How should I use this medicine? This medicine is for infusion into a vein. It is given by a health care professional in a hospital or clinic setting. Talk to your pediatrician regarding the use of this medicine in children. While this drug may be prescribed for selected conditions, precautions do apply. Overdosage: If you think you have taken too much of this medicine contact a poison control center or emergency room at once. NOTE: This medicine is only for you. Do not share this medicine with others. What if I miss a dose? This does not apply. What may interact with this medicine? This medicine may interact with the following medications: -certain medicines for anxiety or sleep -certain medicines for seizures like phenobarbital -digoxin -medicines that relax muscles for surgery -narcotic medicines for pain This list may not describe all possible interactions. Give your health care provider a list of all the medicines, herbs, non-prescription drugs, or dietary supplements you use. Also tell them if you smoke, drink alcohol, or use illegal drugs. Some items may interact with your medicine. What should I watch for while using this medicine? Your condition will be monitored carefully  while you are receiving this medicine. You may need blood work done while you are receiving this medicine. What side effects may I notice from receiving this medicine? Side effects that you should report to your doctor or health care professional as soon as possible: -allergic reactions like skin rash, itching or hives, swelling of the face, lips, or tongue -facial flushing -muscle weakness -signs and symptoms of low blood pressure like dizziness; feeling faint or lightheaded, falls; unusually weak or tired -signs and symptoms of a dangerous change in heartbeat or heart rhythm like chest pain; dizziness; fast or irregular heartbeat; palpitations; breathing problems -sweating This list may not describe all possible side effects. Call your doctor for medical advice about side effects. You may report side effects to FDA at 1-800-FDA-1088. Where should I keep my medicine? This drug is given in a hospital or clinic and will not be stored at home. NOTE: This sheet is a summary. It may not cover all possible information. If you have questions about this medicine, talk to your doctor, pharmacist, or health care provider.  2019 Elsevier/Gold Standard (2016-02-03 12:31:42) [facility] Discharge Instructions for Patients Receiving Chemotherapy  Today you received the following chemotherapy agents:  Carboplatin.  To help prevent nausea and vomiting after your treatment, we encourage you to take your nausea medication as directed.   If you develop nausea and vomiting that is not controlled by your nausea medication, call the clinic.   BELOW ARE SYMPTOMS THAT SHOULD BE REPORTED IMMEDIATELY:  *FEVER GREATER THAN 100.5 F  *CHILLS WITH OR WITHOUT FEVER  NAUSEA AND VOMITING THAT IS NOT CONTROLLED WITH YOUR NAUSEA MEDICATION  *UNUSUAL SHORTNESS OF  BREATH  *UNUSUAL BRUISING OR BLEEDING  TENDERNESS IN MOUTH AND THROAT WITH OR WITHOUT PRESENCE OF ULCERS  *URINARY PROBLEMS  *BOWEL  PROBLEMS  UNUSUAL RASH Items with * indicate a potential emergency and should be followed up as soon as possible.  Feel free to call the clinic should you have any questions or concerns. The clinic phone number is [phone].  Please show the [facility] at check-in to the Emergency Department and triage nurse.

## 2019-01-10 NOTE — Progress Notes (Signed)
Okay to treat today with plts 73, per Dr. Maylon Peppers.

## 2019-01-11 ENCOUNTER — Other Ambulatory Visit: Payer: Self-pay

## 2019-01-11 ENCOUNTER — Ambulatory Visit
Admission: RE | Admit: 2019-01-11 | Discharge: 2019-01-11 | Disposition: A | Discharge status: Home / Self Care | Payer: Medicare HMO | Source: Ambulatory Visit | Attending: Radiation Oncology | Admitting: Radiation Oncology

## 2019-01-11 DIAGNOSIS — I10 Essential (primary) hypertension: Secondary | ICD-10-CM | POA: Diagnosis not present

## 2019-01-11 DIAGNOSIS — Z87891 Personal history of nicotine dependence: Secondary | ICD-10-CM | POA: Diagnosis not present

## 2019-01-11 DIAGNOSIS — C321 Malignant neoplasm of supraglottis: Secondary | ICD-10-CM | POA: Diagnosis not present

## 2019-01-11 DIAGNOSIS — Z79890 Hormone replacement therapy (postmenopausal): Secondary | ICD-10-CM | POA: Diagnosis not present

## 2019-01-11 DIAGNOSIS — E890 Postprocedural hypothyroidism: Secondary | ICD-10-CM | POA: Diagnosis not present

## 2019-01-11 DIAGNOSIS — Z951 Presence of aortocoronary bypass graft: Secondary | ICD-10-CM | POA: Diagnosis not present

## 2019-01-11 DIAGNOSIS — Z79899 Other long term (current) drug therapy: Secondary | ICD-10-CM | POA: Diagnosis not present

## 2019-01-11 DIAGNOSIS — Z7902 Long term (current) use of antithrombotics/antiplatelets: Secondary | ICD-10-CM | POA: Diagnosis not present

## 2019-01-11 DIAGNOSIS — Z510 Encounter for antineoplastic radiation therapy: Secondary | ICD-10-CM | POA: Diagnosis not present

## 2019-01-11 NOTE — Telephone Encounter (Signed)
I called Pt to set up her appointments next week for transportation assistance. She advised she will call me if any changes occur to her current schedule.

## 2019-01-11 NOTE — Progress Notes (Signed)
[facility] OFFICE PROGRESS NOTE  Patient Care Team: Jearld Fenton, NP as PCP - General (Internal Medicine) Irene Shipper, MD as Consulting Physician (Gastroenterology) Early, Arvilla Meres, MD as Consulting Physician (Vascular Surgery) Serafina Mitchell, MD as Consulting Physician (Vascular Surgery) Eppie Gibson, MD as Attending Physician (Radiation Oncology) Leota Sauers, RN as Oncology Nurse Navigator (Oncology) Karie Mainland, RD as Dietitian (Nutrition) Sharen Counter, CCC-SLP as Speech Language Pathologist (Speech Pathology) Kennith Center, LCSW as Social Worker  HEME/ONC OVERVIEW: 1. 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 R Level II LN 5 x 3.3cm; L Level II LN 5.4 x 2.7cm w/ suspicion for ECE) -10/2018: DL with bx by Dr. Redmond Baseman showed a mass involving the left aryepiglottic fold extending up to the epiglottic and BOT, bx showed invasive squamous cell carcinoma; PET showed FDG-avid supraglottic malignancy and bilateral bulky FDG-avid Level II and III cervical LN's, no mets  -Late 10/2018 - present: definitive chemoradiation with weekly carboplatin    2. 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 -S/p 5 cycles of weekly carboplatin concurrent with RT in the definitive setting -Due to worsening thrombocytopenia (see below), we will delay chemotherapy by one week  -As her radiation is due to complete on 01/28/2019, I have requested infusion appt to make up the last dose of chemotherapy near the end of RT -Plan for a total of 7 doses of chemotherapy -Patient declined feeding tube due to lack of social support   -We will re-visit feeding tube if patient's nutrition or hydration status worsens significantly  -PRN anti-emetics: Zofran, Compazine, Ativan and dexamethasone   Chemotherapy-associated leukopenia -Secondary to chemotherapy -WBC 3.1k with ANC 2600, stable  -Patient denies any symptoms of infection -We will monitor for now  Chemotherapy-associated thrombocytopenia -Secondary to chemotherapy -Plts 68k, lower than last week -Patient denies any symptoms of bleeding or excess bruising, such as epistaxis, hematochezia, melena, or hematuria -Given the worsening thrombocytopenia, we will delay her treatment for one week-We will monitor for now; no indication for dose adjustment -We will hold off reducing the dose of chemotherapy unless thrombocytopenia persists   Hypomagnesemia -Secondary to electrolyte wasting from chemotherapy -Mg 1.4 today -I have ordered 6g IV Mg on 01/17/2019  -Continue oral magnesium supplement     Hypercalcemia -Ca 10.4 today, likely due to decreased fluid intake -See IV fluid support below -We will monitor it for now   Protein malnutrition -Secondary to chemoradiation -Weight down by 10 lbs over the past 2 weeks -No feeding tube due to lack of social support and patient preference  -Per pt, her oral intake is reasonable, but unclear how much she is truly taking in; only drinking 16 oz water per day -I have added IV fluid support for the reminder of the week and next Monday/Tuesday -I counseled the patient on the importance of maintaining adequate nutrition and hydration (goal 3-4 bottles of 16oz water per day) -I have also reached out to nutrition for further nutritional recommendations  No orders of the defined types were placed in this encounter.  All questions were answered. The patient knows to call the clinic with any problems, questions or concerns. No  barriers to learning was detected.  Return in 1 week for labs, port flush, clinic appt and Dose #6 of  carboplatin.   Tish Men, MD 01/16/2019 11:52 AM  CHIEF COMPLAINT: "I am trying to eat and drink"  INTERVAL HISTORY: Gloria Murray returns to clinic for follow-up of squamous cell carcinoma of the supraglottic larynx.  Patient reports that she has some intermittent coughing with scant clear sputum production for the past week.  She denies any fever, chill, chest pain, dyspnea, increasing sputum production or hemoptysis.  She reports eating approximately 5 times today, including cream of chicken, soap, peanut butter, cheese spread and ham. She is only drinking approximately 16 ounce water per day due to early satiety.  She denies any pain.  She is only doing salt soda rinses approximately twice a day.  She has lost approximately 10 pounds over the past 2 weeks.  She denies any complaint today.  SUMMARY OF ONCOLOGIC HISTORY: Oncology History  Malignant neoplasm of supraglottis ([place])  10/26/2018 Imaging   CT neck w/ contrast: IMPRESSION: 1. Bulky, partially necrotic bilateral cervical lymphadenopathy most concerning for metastatic disease, less likely unusual infection. 2. Prominent soft tissue at the tongue base with asymmetric left aryepiglottic fold thickening and slight fullness of the left palatine tonsil. Direct visualization is recommended to assess for a primary mucosal malignancy. 3.  Aortic Atherosclerosis (ICD10-I70.0).   11/08/2018 Procedure   DL with biopsy   11/08/2018 Pathology Results   Accession: [id number] 4. Larynx, biopsy, Left Supraglottis - INVASIVE SQUAMOUS CELL CARCINOMA. 2. Larynx, biopsy, Left Ary Epiglottic Fold - INVASIVE SQUAMOUS CELL CARCINOMA. 3. Tongue, biopsy, Left Vallecular Base - INVASIVE SQUAMOUS CELL CARCINOMA. 4. Larynx, biopsy, Right Vallecular Epiglottis - INVASIVE SQUAMOUS CELL CARCINOMA.   11/16/2018 Initial Diagnosis   Laryngeal cancer ([place])   11/23/2018 Imaging   PET: IMPRESSION: 1. Intensely hypermetabolic supraglottic mass  predominantly involving the LEFT aryepiglottic fold but extending across midline anteriorly. Inferiorly hypermetabolic activity extends to the level of the larynx on the LEFT. 2. Bilateral bulky intensely hypermetabolic metastatic level II lymph nodes. Metastatic adenopathy extends with smaller nodes in the level III nodal stations bilaterally. 3. No evidence of thoracic metastasis. 4. Enlarged LEFT adrenal gland consistent with hyperplasia or lipid poor adenoma.   11/27/2018 Cancer Staging   Staging form: Larynx - Supraglottis, AJCC 8th Edition - Clinical stage from 11/27/2018: Stage IVB (cT2, cN3b, cM0) - Signed by Eppie Gibson, MD on 11/27/2018   12/13/2018 -  Chemotherapy   The patient had palonosetron (ALOXI) injection 0.25 mg, 0.25 mg, Intravenous,  Once, 5 of 7 cycles Administration: 0.25 mg (12/13/2018), 0.25 mg (12/20/2018), 0.25 mg (12/27/2018), 0.25 mg (01/03/2019), 0.25 mg (01/10/2019) CARBOplatin (PARAPLATIN) 210 mg in sodium chloride 0.9 % 250 mL chemo infusion, 210 mg (100 % of original dose 213.8 mg), Intravenous,  Once, 5 of 7 cycles Dose modification:   (original dose 213.8 mg, Cycle 1) Administration: 210 mg (12/13/2018), 210 mg (12/20/2018), 210 mg (12/27/2018), 210 mg (01/03/2019), 210 mg (01/10/2019)  for chemotherapy treatment.      REVIEW OF SYSTEMS:   Constitutional: ( - ) fevers, ( - )  chills , ( - ) night sweats Eyes: ( - ) blurriness of vision, ( - ) double vision, ( - ) watery eyes Ears, nose, mouth, throat, and face: ( - ) mucositis, ( - ) sore throat Respiratory: ( + ) cough, ( - ) dyspnea, ( - ) wheezes Cardiovascular: ( - ) palpitation, ( - ) chest discomfort, ( - )  lower extremity swelling Gastrointestinal:  ( - ) nausea, ( - ) heartburn, ( - ) change in bowel habits Skin: ( - ) abnormal skin rashes Lymphatics: ( - ) new lymphadenopathy, ( - ) easy bruising Neurological: ( - ) numbness, ( - ) tingling, ( - ) new weaknesses Behavioral/Psych: ( - ) mood change, (  - ) new changes  All other systems were reviewed with the patient and are negative.  I have reviewed the past medical history, past surgical history, social history and family history with the patient and they are unchanged from previous note.  ALLERGIES:  is allergic to propoxyphene hcl and propoxyphene n-acetaminophen.  MEDICATIONS:  Current Outpatient Medications  Medication Sig Dispense Refill  . Ascorbic Acid (VITAMIN C PO) Take 1 tablet by mouth daily.    Marland Kitchen aspirin EC 81 MG tablet Take 81 mg by mouth daily.    Marland Kitchen CALCIUM PO Take 1 tablet by mouth daily.    . Cholecalciferol (VITAMIN D3 PO) Take 1 tablet by mouth daily.    . clopidogrel (PLAVIX) 75 MG tablet TAKE 1 TABLET (75 MG TOTAL) BY MOUTH DAILY. 90 tablet 3  . dexamethasone (DECADRON) 4 MG tablet Take 2 tablets (8 mg total) by mouth daily. Start the day after chemotherapy for 2 days. Take with food. 30 tablet 1  . fexofenadine (ALLEGRA) 180 MG tablet Take 180 mg by mouth at bedtime.     . fluticasone (FLONASE) 50 MCG/ACT nasal spray Place 1 spray into both nostrils daily.     . Garlic 7494 MG CAPS Take 1 capsule by mouth 2 (two) times daily.      . hydrochlorothiazide (MICROZIDE) 12.5 MG capsule TAKE 1 CAPSULE EVERY DAY 90 capsule 0  . levothyroxine (SYNTHROID) 112 MCG tablet TAKE 1 TABLET EVERY DAY 90 tablet 0  . lidocaine (XYLOCAINE) 2 % solution Patient: Mix 1part 2% viscous lidocaine, 1part H20. Swish & swallow 36mL of diluted mixture, 7min before meals and at bedtime, up to QID 100 mL 5  . lidocaine-prilocaine (EMLA) cream Apply to affected area once 30 g 3  . LORazepam (ATIVAN) 0.5 MG tablet Take 1 tablet (0.5 mg total) by mouth every 6 (six) hours as needed (Nausea or vomiting). 30 tablet 0  . magnesium oxide (MAG-OX) 400 (241.3 Mg) MG tablet Take 1 tablet (400 mg total) by mouth 2 (two) times daily for 30 days. Please mail to the patient 60 tablet 5  . metoprolol succinate (TOPROL-XL) 25 MG 24 hr tablet TAKE 1 TABLET EVERY  DAY 90 tablet 0  . Omega-3 Fatty Acids (FISH OIL) 1000 MG CAPS Take 1 capsule (1,000 mg total) by mouth daily.    . ondansetron (ZOFRAN) 8 MG tablet Take 1 tablet (8 mg total) by mouth 2 (two) times daily as needed for refractory nausea / vomiting. Start on day 3 after chemotherapy. 30 tablet 1  . prochlorperazine (COMPAZINE) 10 MG tablet Take 1 tablet (10 mg total) by mouth every 6 (six) hours as needed (Nausea or vomiting). 30 tablet 1  . simvastatin (ZOCOR) 40 MG tablet TAKE 1 TABLET AT BEDTIME 90 tablet 0  . sodium fluoride (PREVIDENT 5000 PLUS) 1.1 % CREA dental cream Apply cream to tooth brush. Brush teeth for 2 minutes. Spit out excess. DO NOT rinse afterwards. Repeat nightly. 1 Tube prn  . traMADol (ULTRAM) 50 MG tablet Take 1 tablet (50 mg total) by mouth every 8 (eight) hours as needed. Mail to the patient please. Patient has [facility]  grant of $700.00. 60 tablet 1  . VITAMIN E PO Take 1 capsule by mouth daily.     No current facility-administered medications for this visit.     PHYSICAL EXAMINATION: ECOG PERFORMANCE STATUS: 2 - Symptomatic, <50% confined to bed  Today's Vitals   01/16/19 1052 01/16/19 1059  BP: 102/77   Pulse: (!) 105   Resp: 18   Temp: 98 F (36.7 C)   TempSrc: Oral   SpO2: 99%   Weight: 187 lb 11.2 oz (85.1 kg)   Height: 5\' 5"  (1.651 m)   PainSc:  0-No pain   Body mass index is 31.23 kg/m.  Filed Weights   01/16/19 1052  Weight: 187 lb 11.2 oz (85.1 kg)    GENERAL: alert, no distress, slightly disheveled  SKIN: skin color, texture, turgor are normal, no rashes or significant lesions EYES: conjunctiva are pink and non-injected, sclera clear OROPHARYNX: no significant mucositis  NECK: supple, non-tender LYMPH:  bulky right cervical adenopathy measuring ~4cm, firm, slightly fixed, non-tender  LUNGS: clear to auscultation with normal breathing effort HEART: regular rate & rhythm and no murmurs and no lower extremity edema ABDOMEN: soft, non-tender,  non-distended, normal bowel sounds Musculoskeletal: no cyanosis of digits and no clubbing  PSYCH: alert & oriented x 3, fluent speech NEURO: no focal motor/sensory deficits  LABORATORY DATA:  I have reviewed the data as listed    Component Value Date/Time   NA 138 01/16/2019 1032   K 3.6 01/16/2019 1032   CL 92 (L) 01/16/2019 1032   CO2 29 01/16/2019 1032   GLUCOSE 115 (H) 01/16/2019 1032   BUN 17 01/16/2019 1032   CREATININE 0.99 01/16/2019 1032   CREATININE 0.89 10/06/2017 1504   CALCIUM 10.4 (H) 01/16/2019 1032   PROT 7.2 11/21/2018 1251   ALBUMIN 4.1 11/21/2018 1251   AST 15 11/21/2018 1251   ALT 17 11/21/2018 1251   ALKPHOS 67 11/21/2018 1251   BILITOT 0.4 11/21/2018 1251   GFRNONAA 58 (L) 01/16/2019 1032   GFRAA >60 01/16/2019 1032    No results found for: SPEP, UPEP  Lab Results  Component Value Date   WBC 3.1 (L) 01/16/2019   NEUTROABS 2.6 01/16/2019   HGB 14.4 01/16/2019   HCT 41.3 01/16/2019   MCV 90.2 01/16/2019   PLT 68 (L) 01/16/2019      Chemistry      Component Value Date/Time   NA 138 01/16/2019 1032   K 3.6 01/16/2019 1032   CL 92 (L) 01/16/2019 1032   CO2 29 01/16/2019 1032   BUN 17 01/16/2019 1032   CREATININE 0.99 01/16/2019 1032   CREATININE 0.89 10/06/2017 1504      Component Value Date/Time   CALCIUM 10.4 (H) 01/16/2019 1032   ALKPHOS 67 11/21/2018 1251   AST 15 11/21/2018 1251   ALT 17 11/21/2018 1251   BILITOT 0.4 11/21/2018 1251

## 2019-01-14 ENCOUNTER — Other Ambulatory Visit: Payer: Self-pay

## 2019-01-14 ENCOUNTER — Ambulatory Visit
Admission: RE | Admit: 2019-01-14 | Discharge: 2019-01-14 | Disposition: A | Discharge status: Home / Self Care | Payer: Medicare HMO | Source: Ambulatory Visit | Attending: Radiation Oncology | Admitting: Radiation Oncology

## 2019-01-14 DIAGNOSIS — Z79899 Other long term (current) drug therapy: Secondary | ICD-10-CM | POA: Diagnosis not present

## 2019-01-14 DIAGNOSIS — I10 Essential (primary) hypertension: Secondary | ICD-10-CM | POA: Diagnosis not present

## 2019-01-14 DIAGNOSIS — Z87891 Personal history of nicotine dependence: Secondary | ICD-10-CM | POA: Diagnosis not present

## 2019-01-14 DIAGNOSIS — E890 Postprocedural hypothyroidism: Secondary | ICD-10-CM | POA: Diagnosis not present

## 2019-01-14 DIAGNOSIS — C321 Malignant neoplasm of supraglottis: Secondary | ICD-10-CM | POA: Diagnosis not present

## 2019-01-14 DIAGNOSIS — Z951 Presence of aortocoronary bypass graft: Secondary | ICD-10-CM | POA: Diagnosis not present

## 2019-01-14 DIAGNOSIS — Z79890 Hormone replacement therapy (postmenopausal): Secondary | ICD-10-CM | POA: Diagnosis not present

## 2019-01-14 DIAGNOSIS — Z7902 Long term (current) use of antithrombotics/antiplatelets: Secondary | ICD-10-CM | POA: Diagnosis not present

## 2019-01-14 DIAGNOSIS — Z510 Encounter for antineoplastic radiation therapy: Secondary | ICD-10-CM | POA: Diagnosis not present

## 2019-01-14 MED ORDER — SONAFINE EX EMUL
1.0000 "application " | Freq: Two times a day (BID) | CUTANEOUS | Status: DC
  Administered 2019-01-14: 1 via TOPICAL

## 2019-01-15 ENCOUNTER — Other Ambulatory Visit: Payer: Self-pay

## 2019-01-15 ENCOUNTER — Ambulatory Visit
Admission: RE | Admit: 2019-01-15 | Discharge: 2019-01-15 | Disposition: A | Discharge status: Home / Self Care | Payer: Medicare HMO | Source: Ambulatory Visit | Attending: Radiation Oncology | Admitting: Radiation Oncology

## 2019-01-15 ENCOUNTER — Inpatient Hospital Stay: Payer: Medicare HMO | Admitting: Nutrition

## 2019-01-15 DIAGNOSIS — Z510 Encounter for antineoplastic radiation therapy: Secondary | ICD-10-CM | POA: Diagnosis not present

## 2019-01-15 DIAGNOSIS — E890 Postprocedural hypothyroidism: Secondary | ICD-10-CM | POA: Diagnosis not present

## 2019-01-15 DIAGNOSIS — C321 Malignant neoplasm of supraglottis: Secondary | ICD-10-CM | POA: Diagnosis not present

## 2019-01-15 DIAGNOSIS — Z951 Presence of aortocoronary bypass graft: Secondary | ICD-10-CM | POA: Diagnosis not present

## 2019-01-15 DIAGNOSIS — I10 Essential (primary) hypertension: Secondary | ICD-10-CM | POA: Diagnosis not present

## 2019-01-15 DIAGNOSIS — Z87891 Personal history of nicotine dependence: Secondary | ICD-10-CM | POA: Diagnosis not present

## 2019-01-15 DIAGNOSIS — Z79899 Other long term (current) drug therapy: Secondary | ICD-10-CM | POA: Diagnosis not present

## 2019-01-15 DIAGNOSIS — Z7902 Long term (current) use of antithrombotics/antiplatelets: Secondary | ICD-10-CM | POA: Diagnosis not present

## 2019-01-15 DIAGNOSIS — Z79890 Hormone replacement therapy (postmenopausal): Secondary | ICD-10-CM | POA: Diagnosis not present

## 2019-01-15 NOTE — Progress Notes (Signed)
RD working remotely.  Contacted patient by phone however, she was unavailable. Left my name and phone number for return call.

## 2019-01-16 ENCOUNTER — Inpatient Hospital Stay: Payer: Medicare HMO

## 2019-01-16 ENCOUNTER — Inpatient Hospital Stay (HOSPITAL_BASED_OUTPATIENT_CLINIC_OR_DEPARTMENT_OTHER): Payer: Medicare HMO | Admitting: Hematology

## 2019-01-16 ENCOUNTER — Ambulatory Visit
Admission: RE | Admit: 2019-01-16 | Discharge: 2019-01-16 | Disposition: A | Discharge status: Home / Self Care | Payer: Medicare HMO | Source: Ambulatory Visit | Attending: Radiation Oncology | Admitting: Radiation Oncology

## 2019-01-16 ENCOUNTER — Encounter: Payer: Self-pay | Admitting: Hematology

## 2019-01-16 ENCOUNTER — Other Ambulatory Visit: Payer: Self-pay

## 2019-01-16 ENCOUNTER — Telehealth: Payer: Self-pay | Admitting: *Deleted

## 2019-01-16 VITALS — BP 102/77 | HR 105 | Temp 98.0°F | Resp 18 | Ht 65.0 in | Wt 187.7 lb

## 2019-01-16 DIAGNOSIS — R634 Abnormal weight loss: Secondary | ICD-10-CM | POA: Diagnosis not present

## 2019-01-16 DIAGNOSIS — Z5111 Encounter for antineoplastic chemotherapy: Secondary | ICD-10-CM

## 2019-01-16 DIAGNOSIS — Z951 Presence of aortocoronary bypass graft: Secondary | ICD-10-CM | POA: Diagnosis not present

## 2019-01-16 DIAGNOSIS — Z7902 Long term (current) use of antithrombotics/antiplatelets: Secondary | ICD-10-CM | POA: Diagnosis not present

## 2019-01-16 DIAGNOSIS — Z79899 Other long term (current) drug therapy: Secondary | ICD-10-CM | POA: Diagnosis not present

## 2019-01-16 DIAGNOSIS — D6959 Other secondary thrombocytopenia: Secondary | ICD-10-CM | POA: Insufficient documentation

## 2019-01-16 DIAGNOSIS — D701 Agranulocytosis secondary to cancer chemotherapy: Secondary | ICD-10-CM | POA: Diagnosis not present

## 2019-01-16 DIAGNOSIS — Z923 Personal history of irradiation: Secondary | ICD-10-CM

## 2019-01-16 DIAGNOSIS — Z95828 Presence of other vascular implants and grafts: Secondary | ICD-10-CM

## 2019-01-16 DIAGNOSIS — R112 Nausea with vomiting, unspecified: Secondary | ICD-10-CM

## 2019-01-16 DIAGNOSIS — T451X5A Adverse effect of antineoplastic and immunosuppressive drugs, initial encounter: Secondary | ICD-10-CM

## 2019-01-16 DIAGNOSIS — Z7982 Long term (current) use of aspirin: Secondary | ICD-10-CM

## 2019-01-16 DIAGNOSIS — C321 Malignant neoplasm of supraglottis: Secondary | ICD-10-CM

## 2019-01-16 DIAGNOSIS — Z79890 Hormone replacement therapy (postmenopausal): Secondary | ICD-10-CM | POA: Diagnosis not present

## 2019-01-16 DIAGNOSIS — I10 Essential (primary) hypertension: Secondary | ICD-10-CM | POA: Diagnosis not present

## 2019-01-16 DIAGNOSIS — I700 Atherosclerosis of aorta: Secondary | ICD-10-CM

## 2019-01-16 DIAGNOSIS — Z87891 Personal history of nicotine dependence: Secondary | ICD-10-CM | POA: Diagnosis not present

## 2019-01-16 DIAGNOSIS — E46 Unspecified protein-calorie malnutrition: Secondary | ICD-10-CM

## 2019-01-16 DIAGNOSIS — E890 Postprocedural hypothyroidism: Secondary | ICD-10-CM | POA: Diagnosis not present

## 2019-01-16 DIAGNOSIS — Z510 Encounter for antineoplastic radiation therapy: Secondary | ICD-10-CM | POA: Diagnosis not present

## 2019-01-16 LAB — MAGNESIUM: Magnesium: 1.4 mg/dL — CL (ref 1.7–2.4)

## 2019-01-16 LAB — BASIC METABOLIC PANEL - CANCER CENTER ONLY
Anion gap: 17 — ABNORMAL HIGH (ref 5–15)
BUN: 17 mg/dL (ref 8–23)
CO2: 29 mmol/L (ref 22–32)
Calcium: 10.4 mg/dL — ABNORMAL HIGH (ref 8.9–10.3)
Chloride: 92 mmol/L — ABNORMAL LOW (ref 98–111)
Creatinine: 0.99 mg/dL (ref 0.44–1.00)
GFR, Est AFR Am: >60 mL/min (ref >60)
GFR, Estimated: 58 mL/min — ABNORMAL LOW (ref >60)
Glucose, Bld: 115 mg/dL — ABNORMAL HIGH (ref 70–99)
Potassium: 3.6 mmol/L (ref 3.5–5.1)
Sodium: 138 mmol/L (ref 135–145)

## 2019-01-16 LAB — CBC WITH DIFFERENTIAL (CANCER CENTER ONLY)
Abs Immature Granulocytes: 0 10*3/uL (ref 0.00–0.07)
Basophils Absolute: 0 10*3/uL (ref 0.0–0.1)
Basophils Relative: 0 %
Eosinophils Absolute: 0 10*3/uL (ref 0.0–0.5)
Eosinophils Relative: 0 %
HCT: 41.3 % (ref 36.0–46.0)
Hemoglobin: 14.4 g/dL (ref 12.0–15.0)
Immature Granulocytes: 0 %
Lymphocytes Relative: 9 %
Lymphs Abs: 0.3 10*3/uL — ABNORMAL LOW (ref 0.7–4.0)
MCH: 31.4 pg (ref 26.0–34.0)
MCHC: 34.9 g/dL (ref 30.0–36.0)
MCV: 90.2 fL (ref 80.0–100.0)
Monocytes Absolute: 0.2 10*3/uL (ref 0.1–1.0)
Monocytes Relative: 8 %
Neutro Abs: 2.6 10*3/uL (ref 1.7–7.7)
Neutrophils Relative %: 83 %
Platelet Count: 68 10*3/uL — ABNORMAL LOW (ref 150–400)
RBC: 4.58 MIL/uL (ref 3.87–5.11)
RDW: 14.3 % (ref 11.5–15.5)
WBC Count: 3.1 10*3/uL — ABNORMAL LOW (ref 4.0–10.5)
nRBC: 0 % (ref 0.0–0.2)

## 2019-01-16 MED ORDER — SODIUM CHLORIDE 0.9 % IV SOLN: Freq: Once | INTRAVENOUS | Status: DC

## 2019-01-16 MED ORDER — LIDOCAINE-PRILOCAINE 2.5-2.5 % EX CREA: TOPICAL_CREAM | CUTANEOUS | 3 refills | Status: AC

## 2019-01-16 MED ORDER — HEPARIN SOD (PORK) LOCK FLUSH 100 UNIT/ML IV SOLN
250.0000 [IU] | Freq: Once | INTRAVENOUS | Status: AC | PRN
  Administered 2019-01-16: 250 [IU]
  Filled 2019-01-16: qty 5

## 2019-01-16 MED ORDER — SODIUM CHLORIDE 0.9% FLUSH
10.0000 mL | Freq: Once | INTRAVENOUS | Status: AC | PRN
  Administered 2019-01-16: 10 mL
  Filled 2019-01-16: qty 10

## 2019-01-16 MED FILL — LIDOCAINE-PRILOCAINE CREAM: 2.5-2.5 | 30 days supply | Qty: 30 | Fill #0

## 2019-01-16 NOTE — Telephone Encounter (Signed)
Received call report from [facility].  "Today's Mg = 1.4."  Secure Chat sent with results has been read.

## 2019-01-17 ENCOUNTER — Inpatient Hospital Stay: Payer: Medicare HMO

## 2019-01-17 ENCOUNTER — Ambulatory Visit
Admission: RE | Admit: 2019-01-17 | Discharge: 2019-01-17 | Disposition: A | Discharge status: Home / Self Care | Payer: Medicare HMO | Source: Ambulatory Visit | Attending: Radiation Oncology | Admitting: Radiation Oncology

## 2019-01-17 ENCOUNTER — Other Ambulatory Visit: Payer: Self-pay

## 2019-01-17 ENCOUNTER — Telehealth: Payer: Self-pay | Admitting: Internal Medicine

## 2019-01-17 DIAGNOSIS — R112 Nausea with vomiting, unspecified: Secondary | ICD-10-CM | POA: Diagnosis not present

## 2019-01-17 DIAGNOSIS — C321 Malignant neoplasm of supraglottis: Secondary | ICD-10-CM

## 2019-01-17 DIAGNOSIS — Z95828 Presence of other vascular implants and grafts: Secondary | ICD-10-CM

## 2019-01-17 DIAGNOSIS — Z510 Encounter for antineoplastic radiation therapy: Secondary | ICD-10-CM | POA: Diagnosis not present

## 2019-01-17 DIAGNOSIS — E890 Postprocedural hypothyroidism: Secondary | ICD-10-CM | POA: Diagnosis not present

## 2019-01-17 DIAGNOSIS — Z87891 Personal history of nicotine dependence: Secondary | ICD-10-CM | POA: Diagnosis not present

## 2019-01-17 DIAGNOSIS — Z7902 Long term (current) use of antithrombotics/antiplatelets: Secondary | ICD-10-CM | POA: Diagnosis not present

## 2019-01-17 DIAGNOSIS — I10 Essential (primary) hypertension: Secondary | ICD-10-CM | POA: Diagnosis not present

## 2019-01-17 DIAGNOSIS — D701 Agranulocytosis secondary to cancer chemotherapy: Secondary | ICD-10-CM | POA: Diagnosis not present

## 2019-01-17 DIAGNOSIS — R634 Abnormal weight loss: Secondary | ICD-10-CM | POA: Diagnosis not present

## 2019-01-17 DIAGNOSIS — T451X5A Adverse effect of antineoplastic and immunosuppressive drugs, initial encounter: Secondary | ICD-10-CM | POA: Diagnosis not present

## 2019-01-17 DIAGNOSIS — E46 Unspecified protein-calorie malnutrition: Secondary | ICD-10-CM | POA: Diagnosis not present

## 2019-01-17 DIAGNOSIS — Z951 Presence of aortocoronary bypass graft: Secondary | ICD-10-CM | POA: Diagnosis not present

## 2019-01-17 DIAGNOSIS — Z79899 Other long term (current) drug therapy: Secondary | ICD-10-CM | POA: Diagnosis not present

## 2019-01-17 DIAGNOSIS — Z79890 Hormone replacement therapy (postmenopausal): Secondary | ICD-10-CM | POA: Diagnosis not present

## 2019-01-17 DIAGNOSIS — Z5111 Encounter for antineoplastic chemotherapy: Secondary | ICD-10-CM | POA: Diagnosis not present

## 2019-01-17 MED ORDER — SODIUM CHLORIDE 0.9% FLUSH
10.0000 mL | Freq: Once | INTRAVENOUS | Status: AC
  Administered 2019-01-17: 11:00:00 10 mL
  Filled 2019-01-17: qty 10

## 2019-01-17 MED ORDER — SODIUM CHLORIDE 0.9 % IV SOLN
Freq: Once | INTRAVENOUS | Status: AC
  Administered 2019-01-17: 08:00:00 via INTRAVENOUS
  Filled 2019-01-17: qty 1000

## 2019-01-17 MED ORDER — PROCHLORPERAZINE EDISYLATE 10 MG/2ML IJ SOLN
10.0000 mg | Freq: Once | INTRAMUSCULAR | Status: AC
  Administered 2019-01-17: 10 mg via INTRAVENOUS
  Filled 2019-01-17: qty 2

## 2019-01-17 MED ORDER — HEPARIN SOD (PORK) LOCK FLUSH 100 UNIT/ML IV SOLN
500.0000 [IU] | Freq: Once | INTRAVENOUS | Status: AC
  Administered 2019-01-17: 500 [IU]
  Filled 2019-01-17: qty 5

## 2019-01-17 MED ORDER — PROCHLORPERAZINE EDISYLATE 10 MG/2ML IJ SOLN
INTRAMUSCULAR | Status: AC
  Filled 2019-01-17: qty 2

## 2019-01-17 NOTE — Telephone Encounter (Signed)
Pt called to schedule a ride for Saturday 6/20. However, I checked at 01:59 PM and there is no appointment set up for Saturday morning.

## 2019-01-17 NOTE — Patient Instructions (Signed)
Hypomagnesemia Hypomagnesemia is a condition in which the level of magnesium in the blood is low. Magnesium is a mineral that is found in many foods. It is used in many different processes in the body. Hypomagnesemia can affect every organ in the body. In severe cases, it can cause life-threatening problems. What are the causes? This condition may be caused by:  Not getting enough magnesium in your diet.  Malnutrition.  Problems with absorbing magnesium from the intestines.  Dehydration.  Alcohol abuse.  Vomiting.  Severe or chronic diarrhea.  Some medicines, including medicines that make you urinate more (diuretics).  Certain diseases, such as kidney disease, diabetes, celiac disease, and overactive thyroid. What are the signs or symptoms? Symptoms of this condition include:  Loss of appetite.  Nausea and vomiting.  Involuntary shaking or trembling of a body part (tremor).  Muscle weakness.  Tingling in the arms and legs.  Sudden tightening of muscles (muscle spasms).  Confusion.  Psychiatric issues, such as depression, irritability, or psychosis.  A feeling of fluttering of the heart.  Seizures. These symptoms are more severe if magnesium levels drop suddenly. How is this diagnosed? This condition may be diagnosed based on:  Your symptoms and medical history.  A physical exam.  Blood and urine tests. How is this treated? Treatment depends on the cause and the severity of the condition. It may be treated with:  A magnesium supplement. This can be taken in pill form. If the condition is severe, magnesium is usually given through an IV.  Changes to your diet. You may be directed to eat foods that have a lot of magnesium, such as green leafy vegetables, peas, beans, and nuts.  Stopping any intake of alcohol. Follow these instructions at home:      Make sure that your diet includes foods with magnesium. Foods that have a lot of magnesium in them include:  ? Green leafy vegetables, such as spinach and broccoli. ? Beans and peas. ? Nuts and seeds, such as almonds and sunflower seeds. ? Whole grains, such as whole grain bread and fortified cereals.  Take magnesium supplements if your health care provider tells you to do that. Take them as directed.  Take over-the-counter and prescription medicines only as told by your health care provider.  Have your magnesium levels monitored as told by your health care provider.  When you are active, drink fluids that contain electrolytes.  Avoid drinking alcohol.  Keep all follow-up visits as told by your health care provider. This is important. Contact a health care provider if:  You get worse instead of better.  Your symptoms return. Get help right away if you:  Develop severe muscle weakness.  Have trouble breathing.  Feel that your heart is racing. Summary  Hypomagnesemia is a condition in which the level of magnesium in the blood is low.  Hypomagnesemia can affect every organ in the body.  Treatment may include eating more foods that contain magnesium, taking magnesium supplements, and not drinking alcohol.  Have your magnesium levels monitored as told by your health care provider. This information is not intended to replace advice given to you by your health care provider. Make sure you discuss any questions you have with your health care provider. Document Released: 04/13/2005 Document Revised: 06/19/2017 Document Reviewed: 06/19/2017 Elsevier Interactive Patient Education  2019 [place] (COVID-19) Are you at risk?  Are you at risk for the Coronavirus (COVID-19)?  To be considered HIGH RISK for Coronavirus (COVID-19), you  have to meet the following criteria:  . Traveled to [place], [place], [place], [place] or [place]; or in the [place] to [place], [place], [place], or [place]; and have fever, cough, and shortness of breath within the last 2 weeks  of travel OR . Been in close contact with a person diagnosed with COVID-19 within the last 2 weeks and have fever, cough, and shortness of breath . IF YOU DO NOT MEET THESE CRITERIA, YOU ARE CONSIDERED LOW RISK FOR COVID-19.  What to do if you are HIGH RISK for COVID-19?  Marland Kitchen If you are having a medical emergency, call 911. . Seek medical care right away. Before you go to a doctor's office, urgent care or emergency department, call ahead and tell them about your recent travel, contact with someone diagnosed with COVID-19, and your symptoms. You should receive instructions from your physician's office regarding next steps of care.  . When you arrive at healthcare provider, tell the healthcare staff immediately you have returned from visiting [place], [place], [place], [place] or [place]; or traveled in the [place] to [place], [place], [place], or [place]; in the last two weeks or you have been in close contact with a person diagnosed with COVID-19 in the last 2 weeks.   . Tell the health care staff about your symptoms: fever, cough and shortness of breath. . After you have been seen by a medical provider, you will be either: o Tested for (COVID-19) and discharged home on quarantine except to seek medical care if symptoms worsen, and asked to  - Stay home and avoid contact with others until you get your results (4-5 days)  - Avoid travel on public transportation if possible (such as bus, train, or airplane) or o Sent to the Emergency Department by EMS for evaluation, COVID-19 testing, and possible admission depending on your condition and test results.  What to do if you are LOW RISK for COVID-19?  Reduce your risk of any infection by using the same precautions used for avoiding the common cold or flu:  Marland Kitchen Wash your hands often with soap and warm water for at least 20 seconds.  If soap and water are not readily available, use an alcohol-based hand sanitizer with at least 60% alcohol.  . If  coughing or sneezing, cover your mouth and nose by coughing or sneezing into the elbow areas of your shirt or coat, into a tissue or into your sleeve (not your hands). . Avoid shaking hands with others and consider head nods or verbal greetings only. . Avoid touching your eyes, nose, or mouth with unwashed hands.  . Avoid close contact with people who are sick. . Avoid places or events with large numbers of people in one location, like concerts or sporting events. . Carefully consider travel plans you have or are making. . If you are planning any travel outside or inside the [place], visit the CDC's Travelers' Health webpage for the latest health notices. . If you have some symptoms but not all symptoms, continue to monitor at home and seek medical attention if your symptoms worsen. . If you are having a medical emergency, call 911.   [place] / e-Visit: [email or web address]         [facility]: [facility]: [phone]                   [facility]: [phone]

## 2019-01-18 ENCOUNTER — Other Ambulatory Visit: Payer: Self-pay

## 2019-01-18 ENCOUNTER — Telehealth: Payer: Self-pay | Admitting: Oncology

## 2019-01-18 ENCOUNTER — Other Ambulatory Visit: Payer: Self-pay | Admitting: Radiation Oncology

## 2019-01-18 ENCOUNTER — Ambulatory Visit
Admission: RE | Admit: 2019-01-18 | Discharge: 2019-01-18 | Disposition: A | Discharge status: Home / Self Care | Payer: Medicare HMO | Source: Ambulatory Visit | Attending: Radiation Oncology | Admitting: Radiation Oncology

## 2019-01-18 DIAGNOSIS — Z7902 Long term (current) use of antithrombotics/antiplatelets: Secondary | ICD-10-CM | POA: Diagnosis not present

## 2019-01-18 DIAGNOSIS — Z87891 Personal history of nicotine dependence: Secondary | ICD-10-CM | POA: Diagnosis not present

## 2019-01-18 DIAGNOSIS — C321 Malignant neoplasm of supraglottis: Secondary | ICD-10-CM

## 2019-01-18 DIAGNOSIS — Z510 Encounter for antineoplastic radiation therapy: Secondary | ICD-10-CM | POA: Diagnosis not present

## 2019-01-18 DIAGNOSIS — Z79899 Other long term (current) drug therapy: Secondary | ICD-10-CM | POA: Diagnosis not present

## 2019-01-18 DIAGNOSIS — I10 Essential (primary) hypertension: Secondary | ICD-10-CM | POA: Diagnosis not present

## 2019-01-18 DIAGNOSIS — Z79890 Hormone replacement therapy (postmenopausal): Secondary | ICD-10-CM | POA: Diagnosis not present

## 2019-01-18 DIAGNOSIS — E890 Postprocedural hypothyroidism: Secondary | ICD-10-CM | POA: Diagnosis not present

## 2019-01-18 DIAGNOSIS — Z951 Presence of aortocoronary bypass graft: Secondary | ICD-10-CM | POA: Diagnosis not present

## 2019-01-18 NOTE — Telephone Encounter (Signed)
This is Dr. Maylon Peppers patient, not sherrill

## 2019-01-18 NOTE — Telephone Encounter (Signed)
Called and spoke with patient. Confirmed dates and times of upcoming appts. Can not come to appt on 6/20 due to not having transportation.

## 2019-01-19 ENCOUNTER — Inpatient Hospital Stay: Payer: Medicare HMO

## 2019-01-21 ENCOUNTER — Ambulatory Visit: Payer: Medicare HMO

## 2019-01-21 ENCOUNTER — Inpatient Hospital Stay: Payer: Medicare HMO

## 2019-01-21 ENCOUNTER — Ambulatory Visit
Admission: RE | Admit: 2019-01-21 | Discharge: 2019-01-21 | Disposition: A | Discharge status: Home / Self Care | Payer: Medicare HMO | Source: Ambulatory Visit | Attending: Radiation Oncology | Admitting: Radiation Oncology

## 2019-01-21 ENCOUNTER — Other Ambulatory Visit: Payer: Self-pay

## 2019-01-21 VITALS — BP 140/82 | HR 97 | Temp 97.7°F | Resp 19

## 2019-01-21 DIAGNOSIS — D701 Agranulocytosis secondary to cancer chemotherapy: Secondary | ICD-10-CM | POA: Diagnosis not present

## 2019-01-21 DIAGNOSIS — C321 Malignant neoplasm of supraglottis: Secondary | ICD-10-CM

## 2019-01-21 DIAGNOSIS — Z79899 Other long term (current) drug therapy: Secondary | ICD-10-CM | POA: Diagnosis not present

## 2019-01-21 DIAGNOSIS — E890 Postprocedural hypothyroidism: Secondary | ICD-10-CM | POA: Diagnosis not present

## 2019-01-21 DIAGNOSIS — Z5111 Encounter for antineoplastic chemotherapy: Secondary | ICD-10-CM | POA: Diagnosis not present

## 2019-01-21 DIAGNOSIS — Z87891 Personal history of nicotine dependence: Secondary | ICD-10-CM | POA: Diagnosis not present

## 2019-01-21 DIAGNOSIS — Z7902 Long term (current) use of antithrombotics/antiplatelets: Secondary | ICD-10-CM | POA: Diagnosis not present

## 2019-01-21 DIAGNOSIS — R112 Nausea with vomiting, unspecified: Secondary | ICD-10-CM | POA: Diagnosis not present

## 2019-01-21 DIAGNOSIS — Z79890 Hormone replacement therapy (postmenopausal): Secondary | ICD-10-CM | POA: Diagnosis not present

## 2019-01-21 DIAGNOSIS — E46 Unspecified protein-calorie malnutrition: Secondary | ICD-10-CM | POA: Diagnosis not present

## 2019-01-21 DIAGNOSIS — Z510 Encounter for antineoplastic radiation therapy: Secondary | ICD-10-CM | POA: Diagnosis not present

## 2019-01-21 DIAGNOSIS — T451X5A Adverse effect of antineoplastic and immunosuppressive drugs, initial encounter: Secondary | ICD-10-CM | POA: Diagnosis not present

## 2019-01-21 DIAGNOSIS — Z951 Presence of aortocoronary bypass graft: Secondary | ICD-10-CM | POA: Diagnosis not present

## 2019-01-21 DIAGNOSIS — I10 Essential (primary) hypertension: Secondary | ICD-10-CM | POA: Diagnosis not present

## 2019-01-21 DIAGNOSIS — Z95828 Presence of other vascular implants and grafts: Secondary | ICD-10-CM

## 2019-01-21 DIAGNOSIS — R634 Abnormal weight loss: Secondary | ICD-10-CM | POA: Diagnosis not present

## 2019-01-21 MED ORDER — ONDANSETRON HCL 4 MG/2ML IJ SOLN
INTRAMUSCULAR | Status: AC
  Filled 2019-01-21: qty 2

## 2019-01-21 MED ORDER — ONDANSETRON HCL 4 MG/2ML IJ SOLN: 4.0000 mg | Freq: Once | INTRAMUSCULAR | Status: DC

## 2019-01-21 MED ORDER — HEPARIN SOD (PORK) LOCK FLUSH 100 UNIT/ML IV SOLN
500.0000 [IU] | Freq: Once | INTRAVENOUS | Status: AC
  Administered 2019-01-21: 500 [IU]
  Filled 2019-01-21: qty 5

## 2019-01-21 MED ORDER — SODIUM CHLORIDE 0.9 % IV SOLN
Freq: Once | INTRAVENOUS | Status: AC
  Administered 2019-01-21: 13:00:00 via INTRAVENOUS
  Filled 2019-01-21: qty 250

## 2019-01-21 MED ORDER — ONDANSETRON HCL 4 MG/2ML IJ SOLN
8.0000 mg | Freq: Once | INTRAMUSCULAR | Status: AC
  Administered 2019-01-21: 8 mg via INTRAVENOUS

## 2019-01-21 MED ORDER — SODIUM CHLORIDE 0.9% FLUSH
10.0000 mL | Freq: Once | INTRAVENOUS | Status: AC
  Administered 2019-01-21: 15:00:00 10 mL
  Filled 2019-01-21: qty 10

## 2019-01-21 NOTE — Patient Instructions (Signed)

## 2019-01-22 ENCOUNTER — Ambulatory Visit
Admission: RE | Admit: 2019-01-22 | Discharge: 2019-01-22 | Disposition: A | Discharge status: Home / Self Care | Payer: Medicare HMO | Source: Ambulatory Visit | Attending: Radiation Oncology | Admitting: Radiation Oncology

## 2019-01-22 ENCOUNTER — Other Ambulatory Visit: Payer: Self-pay

## 2019-01-22 ENCOUNTER — Ambulatory Visit: Payer: Medicare HMO

## 2019-01-22 ENCOUNTER — Inpatient Hospital Stay: Payer: Medicare HMO

## 2019-01-22 VITALS — BP 138/80 | HR 88 | Temp 98.7°F | Resp 18

## 2019-01-22 DIAGNOSIS — Z510 Encounter for antineoplastic radiation therapy: Secondary | ICD-10-CM | POA: Diagnosis not present

## 2019-01-22 DIAGNOSIS — Z87891 Personal history of nicotine dependence: Secondary | ICD-10-CM | POA: Diagnosis not present

## 2019-01-22 DIAGNOSIS — D701 Agranulocytosis secondary to cancer chemotherapy: Secondary | ICD-10-CM | POA: Diagnosis not present

## 2019-01-22 DIAGNOSIS — R634 Abnormal weight loss: Secondary | ICD-10-CM | POA: Diagnosis not present

## 2019-01-22 DIAGNOSIS — I10 Essential (primary) hypertension: Secondary | ICD-10-CM | POA: Diagnosis not present

## 2019-01-22 DIAGNOSIS — T451X5A Adverse effect of antineoplastic and immunosuppressive drugs, initial encounter: Secondary | ICD-10-CM | POA: Diagnosis not present

## 2019-01-22 DIAGNOSIS — R112 Nausea with vomiting, unspecified: Secondary | ICD-10-CM | POA: Diagnosis not present

## 2019-01-22 DIAGNOSIS — Z5111 Encounter for antineoplastic chemotherapy: Secondary | ICD-10-CM | POA: Diagnosis not present

## 2019-01-22 DIAGNOSIS — Z79899 Other long term (current) drug therapy: Secondary | ICD-10-CM | POA: Diagnosis not present

## 2019-01-22 DIAGNOSIS — Z951 Presence of aortocoronary bypass graft: Secondary | ICD-10-CM | POA: Diagnosis not present

## 2019-01-22 DIAGNOSIS — Z7902 Long term (current) use of antithrombotics/antiplatelets: Secondary | ICD-10-CM | POA: Diagnosis not present

## 2019-01-22 DIAGNOSIS — E890 Postprocedural hypothyroidism: Secondary | ICD-10-CM | POA: Diagnosis not present

## 2019-01-22 DIAGNOSIS — Z79890 Hormone replacement therapy (postmenopausal): Secondary | ICD-10-CM | POA: Diagnosis not present

## 2019-01-22 DIAGNOSIS — Z95828 Presence of other vascular implants and grafts: Secondary | ICD-10-CM

## 2019-01-22 DIAGNOSIS — E46 Unspecified protein-calorie malnutrition: Secondary | ICD-10-CM | POA: Diagnosis not present

## 2019-01-22 DIAGNOSIS — C321 Malignant neoplasm of supraglottis: Secondary | ICD-10-CM | POA: Diagnosis not present

## 2019-01-22 MED ORDER — SODIUM CHLORIDE 0.9% FLUSH
10.0000 mL | Freq: Once | INTRAVENOUS | Status: AC | PRN
  Administered 2019-01-22: 10 mL
  Filled 2019-01-22: qty 10

## 2019-01-22 MED ORDER — SODIUM CHLORIDE 0.9 % IV SOLN
Freq: Once | INTRAVENOUS | Status: AC
  Administered 2019-01-22: 12:00:00 via INTRAVENOUS
  Filled 2019-01-22: qty 250

## 2019-01-22 MED ORDER — ONDANSETRON HCL 4 MG/2ML IJ SOLN
8.0000 mg | Freq: Once | INTRAMUSCULAR | Status: AC
  Administered 2019-01-22: 8 mg via INTRAVENOUS

## 2019-01-22 MED ORDER — HEPARIN SOD (PORK) LOCK FLUSH 100 UNIT/ML IV SOLN
500.0000 [IU] | Freq: Once | INTRAVENOUS | Status: AC | PRN
  Administered 2019-01-22: 500 [IU]
  Filled 2019-01-22: qty 5

## 2019-01-22 MED ORDER — ONDANSETRON HCL 4 MG/2ML IJ SOLN
INTRAMUSCULAR | Status: AC
  Filled 2019-01-22: qty 4

## 2019-01-23 ENCOUNTER — Telehealth: Payer: Self-pay | Admitting: *Deleted

## 2019-01-23 ENCOUNTER — Ambulatory Visit
Admission: RE | Admit: 2019-01-23 | Discharge: 2019-01-23 | Disposition: A | Discharge status: Home / Self Care | Payer: Medicare HMO | Source: Ambulatory Visit | Attending: Radiation Oncology | Admitting: Radiation Oncology

## 2019-01-23 ENCOUNTER — Other Ambulatory Visit: Payer: Self-pay

## 2019-01-23 ENCOUNTER — Encounter: Payer: Self-pay | Admitting: Nutrition

## 2019-01-23 ENCOUNTER — Inpatient Hospital Stay: Payer: Medicare HMO

## 2019-01-23 ENCOUNTER — Encounter: Payer: Self-pay | Admitting: Hematology

## 2019-01-23 ENCOUNTER — Inpatient Hospital Stay (HOSPITAL_BASED_OUTPATIENT_CLINIC_OR_DEPARTMENT_OTHER): Payer: Medicare HMO | Admitting: Hematology

## 2019-01-23 ENCOUNTER — Inpatient Hospital Stay: Payer: Medicare HMO | Admitting: Nutrition

## 2019-01-23 VITALS — BP 95/62 | HR 89 | Temp 98.8°F | Ht 65.0 in

## 2019-01-23 DIAGNOSIS — Z510 Encounter for antineoplastic radiation therapy: Secondary | ICD-10-CM | POA: Diagnosis not present

## 2019-01-23 DIAGNOSIS — R634 Abnormal weight loss: Secondary | ICD-10-CM

## 2019-01-23 DIAGNOSIS — Z79890 Hormone replacement therapy (postmenopausal): Secondary | ICD-10-CM | POA: Diagnosis not present

## 2019-01-23 DIAGNOSIS — C321 Malignant neoplasm of supraglottis: Secondary | ICD-10-CM

## 2019-01-23 DIAGNOSIS — Z7902 Long term (current) use of antithrombotics/antiplatelets: Secondary | ICD-10-CM | POA: Diagnosis not present

## 2019-01-23 DIAGNOSIS — Z87891 Personal history of nicotine dependence: Secondary | ICD-10-CM | POA: Diagnosis not present

## 2019-01-23 DIAGNOSIS — I700 Atherosclerosis of aorta: Secondary | ICD-10-CM

## 2019-01-23 DIAGNOSIS — Z951 Presence of aortocoronary bypass graft: Secondary | ICD-10-CM | POA: Diagnosis not present

## 2019-01-23 DIAGNOSIS — Z95828 Presence of other vascular implants and grafts: Secondary | ICD-10-CM

## 2019-01-23 DIAGNOSIS — Z79899 Other long term (current) drug therapy: Secondary | ICD-10-CM | POA: Diagnosis not present

## 2019-01-23 DIAGNOSIS — T451X5A Adverse effect of antineoplastic and immunosuppressive drugs, initial encounter: Secondary | ICD-10-CM | POA: Diagnosis not present

## 2019-01-23 DIAGNOSIS — D6959 Other secondary thrombocytopenia: Secondary | ICD-10-CM

## 2019-01-23 DIAGNOSIS — R112 Nausea with vomiting, unspecified: Secondary | ICD-10-CM

## 2019-01-23 DIAGNOSIS — Z5111 Encounter for antineoplastic chemotherapy: Secondary | ICD-10-CM

## 2019-01-23 DIAGNOSIS — D701 Agranulocytosis secondary to cancer chemotherapy: Secondary | ICD-10-CM

## 2019-01-23 DIAGNOSIS — D6481 Anemia due to antineoplastic chemotherapy: Secondary | ICD-10-CM

## 2019-01-23 DIAGNOSIS — Z923 Personal history of irradiation: Secondary | ICD-10-CM

## 2019-01-23 DIAGNOSIS — Z7982 Long term (current) use of aspirin: Secondary | ICD-10-CM

## 2019-01-23 DIAGNOSIS — E46 Unspecified protein-calorie malnutrition: Secondary | ICD-10-CM

## 2019-01-23 DIAGNOSIS — I10 Essential (primary) hypertension: Secondary | ICD-10-CM | POA: Diagnosis not present

## 2019-01-23 DIAGNOSIS — E890 Postprocedural hypothyroidism: Secondary | ICD-10-CM | POA: Diagnosis not present

## 2019-01-23 LAB — CBC WITH DIFFERENTIAL (CANCER CENTER ONLY)
Abs Immature Granulocytes: 0 K/uL (ref 0.00–0.07)
Basophils Absolute: 0 K/uL (ref 0.0–0.1)
Basophils Relative: 0 %
Eosinophils Absolute: 0 K/uL (ref 0.0–0.5)
Eosinophils Relative: 1 %
HCT: 32.9 % — ABNORMAL LOW (ref 36.0–46.0)
Hemoglobin: 11.7 g/dL — ABNORMAL LOW (ref 12.0–15.0)
Immature Granulocytes: 0 %
Lymphocytes Relative: 18 %
Lymphs Abs: 0.1 K/uL — ABNORMAL LOW (ref 0.7–4.0)
MCH: 32.3 pg (ref 26.0–34.0)
MCHC: 35.6 g/dL (ref 30.0–36.0)
MCV: 90.9 fL (ref 80.0–100.0)
Monocytes Absolute: 0.1 K/uL (ref 0.1–1.0)
Monocytes Relative: 15 %
Neutro Abs: 0.5 K/uL — ABNORMAL LOW (ref 1.7–7.7)
Neutrophils Relative %: 66 %
Platelet Count: 90 K/uL — ABNORMAL LOW (ref 150–400)
RBC: 3.62 MIL/uL — ABNORMAL LOW (ref 3.87–5.11)
RDW: 15.3 % (ref 11.5–15.5)
WBC Count: 0.8 K/uL — CL (ref 4.0–10.5)
nRBC: 0 % (ref 0.0–0.2)

## 2019-01-23 LAB — BASIC METABOLIC PANEL - CANCER CENTER ONLY
Anion gap: 15 (ref 5–15)
BUN: 11 mg/dL (ref 8–23)
CO2: 28 mmol/L (ref 22–32)
Calcium: 9.8 mg/dL (ref 8.9–10.3)
Chloride: 96 mmol/L — ABNORMAL LOW (ref 98–111)
Creatinine: 0.77 mg/dL (ref 0.44–1.00)
GFR, Est AFR Am: >60 mL/min (ref >60)
GFR, Estimated: >60 mL/min (ref >60)
Glucose, Bld: 81 mg/dL (ref 70–99)
Potassium: 3.3 mmol/L — ABNORMAL LOW (ref 3.5–5.1)
Sodium: 139 mmol/L (ref 135–145)

## 2019-01-23 LAB — MAGNESIUM: Magnesium: 1.3 mg/dL — CL (ref 1.7–2.4)

## 2019-01-23 MED ORDER — PROCHLORPERAZINE EDISYLATE 10 MG/2ML IJ SOLN
10.0000 mg | INTRAMUSCULAR | Status: DC | PRN
  Administered 2019-01-23: 10 mg via INTRAVENOUS
  Filled 2019-01-23: qty 2

## 2019-01-23 MED ORDER — SODIUM CHLORIDE 0.9 % IV SOLN
INTRAVENOUS | Status: DC
  Administered 2019-01-23: 15:00:00 via INTRAVENOUS
  Filled 2019-01-23: qty 250

## 2019-01-23 MED ORDER — PROCHLORPERAZINE EDISYLATE 10 MG/2ML IJ SOLN
INTRAMUSCULAR | Status: AC
  Filled 2019-01-23: qty 2

## 2019-01-23 MED ORDER — SODIUM CHLORIDE 0.9% FLUSH
10.0000 mL | Freq: Once | INTRAVENOUS | Status: AC
  Administered 2019-01-23: 10 mL
  Filled 2019-01-23: qty 10

## 2019-01-23 MED ORDER — MAGNESIUM SULFATE 4 GM/100ML IV SOLN: 4.0000 g | Freq: Once | INTRAVENOUS | Status: DC

## 2019-01-23 MED ORDER — PROCHLORPERAZINE MALEATE 10 MG PO TABS: 10.0000 mg | ORAL_TABLET | Freq: Four times a day (QID) | ORAL | 1 refills | Status: AC | PRN

## 2019-01-23 MED ORDER — SODIUM CHLORIDE 0.9% FLUSH
10.0000 mL | Freq: Once | INTRAVENOUS | Status: AC | PRN
  Administered 2019-01-23: 17:00:00 10 mL
  Filled 2019-01-23: qty 10

## 2019-01-23 MED ORDER — MAGNESIUM SULFATE 4 GM/100ML IV SOLN
4.0000 g | Freq: Once | INTRAVENOUS | Status: AC
  Administered 2019-01-23: 15:00:00 4 g via INTRAVENOUS
  Filled 2019-01-23: qty 100

## 2019-01-23 MED ORDER — HEPARIN SOD (PORK) LOCK FLUSH 100 UNIT/ML IV SOLN
500.0000 [IU] | Freq: Once | INTRAVENOUS | Status: AC | PRN
  Administered 2019-01-23: 500 [IU]
  Filled 2019-01-23: qty 5

## 2019-01-23 MED ORDER — SODIUM CHLORIDE 0.9% FLUSH
3.0000 mL | Freq: Once | INTRAVENOUS | Status: DC | PRN
  Filled 2019-01-23: qty 10

## 2019-01-23 MED ORDER — HEPARIN SOD (PORK) LOCK FLUSH 100 UNIT/ML IV SOLN
500.0000 [IU] | Freq: Once | INTRAVENOUS | Status: AC
  Administered 2019-01-23: 500 [IU]
  Filled 2019-01-23: qty 5

## 2019-01-23 MED ORDER — HEPARIN SOD (PORK) LOCK FLUSH 100 UNIT/ML IV SOLN
250.0000 [IU] | Freq: Once | INTRAVENOUS | Status: DC | PRN
  Filled 2019-01-23: qty 5

## 2019-01-23 MED ORDER — ONDANSETRON HCL 8 MG PO TABS: 8.0000 mg | ORAL_TABLET | Freq: Two times a day (BID) | ORAL | 1 refills | Status: AC | PRN

## 2019-01-23 MED ORDER — ALTEPLASE 2 MG IJ SOLR
2.0000 mg | Freq: Once | INTRAMUSCULAR | Status: DC | PRN
  Filled 2019-01-23: qty 2

## 2019-01-23 MED ORDER — SODIUM CHLORIDE 0.9 % IV SOLN
Freq: Once | INTRAVENOUS | Status: AC
  Administered 2019-01-23: 15:00:00 via INTRAVENOUS
  Filled 2019-01-23: qty 250

## 2019-01-23 MED FILL — ONDANSETRON HCL 8 MG TABLET: 8 | 15 days supply | Qty: 30 | Fill #0

## 2019-01-23 MED FILL — PROCHLORPERAZINE 10 MG TAB: 10 | 7 days supply | Qty: 30 | Fill #0

## 2019-01-23 NOTE — Telephone Encounter (Signed)
Received call report from [facility].  "Today's Mg = 1.3."  Routing phone encounter with results.  Scheduled provider F/U today at 3:30 pm

## 2019-01-23 NOTE — Progress Notes (Signed)
[facility] OFFICE PROGRESS NOTE  Patient Care Team: Jearld Fenton, NP as PCP - General (Internal Medicine) Irene Shipper, MD as Consulting Physician (Gastroenterology) Early, Arvilla Meres, MD as Consulting Physician (Vascular Surgery) Serafina Mitchell, MD as Consulting Physician (Vascular Surgery) Eppie Gibson, MD as Attending Physician (Radiation Oncology) Leota Sauers, RN as Oncology Nurse Navigator (Oncology) Karie Mainland, RD as Dietitian (Nutrition) Sharen Counter, CCC-SLP as Speech Language Pathologist (Speech Pathology) Kennith Center, LCSW as Social Worker  HEME/ONC OVERVIEW: 1. 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 R Level II LN 5 x 3.3cm; L Level II LN 5.4 x 2.7cm w/ suspicion for ECE) -10/2018: DL with bx by Dr. Redmond Baseman showed a mass involving the left aryepiglottic fold extending up to the epiglottic and BOT, bx showed invasive squamous cell carcinoma; PET showed FDG-avid supraglottic malignancy and bilateral bulky FDG-avid Level II and III cervical LN's, no mets  -Late 10/2018 - present: definitive chemoradiation with weekly carboplatin    2. 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 -S/p 5 cycles of weekly carboplatin concurrent with RT in the definitive setting -Labs notable for severe leukopenia with neutropenia today -Due to the significant risk of infection, we will cancel the chemotherapy this week -We will re-check labs next week and see if she can tolerate the last cycle of chemotherapy  -Patient has declined feeding tube placement due to lack of social support  -PRN anti-emetics: Zofran,  Compazine, Ativan and dexamethasone   Chemotherapy-associated leukopenia -Secondary to chemotherapy -WBC 0.8k with ANC ~500, significantly lower than last week -Patient denies any symptoms of infection -Chemotherapy cancelled this week due to the severe leukopenia with neutropenia  -We will re-check labs next week to see if she can receive the last dose of treatment   Chemotherapy-associated thrombocytopenia -Secondary to chemotherapy -Plts 90k, stable -Patient denies any symptoms of bleeding or excess bruising, such as epistaxis, hematochezia, melena, or hematuria -We will monitor it for now  Chemotherapy-associated anemia -Secondary to chemotherapy -Hgb 11.7, lower than last visit  -Patient denies any symptom of bleeding -We will monitor for now   Hypomagnesemia -Secondary to electrolyte wasting from chemotherapy -Mg 1.4 today -I have ordered 4g IV Mg on 6/24 and 01/24/2019  -Continue oral magnesium supplement    Chemotherapy-associated nausea  -Secondary to chemotherapy and thick saliva -I have refilled the patient's anti-medics today, and added IV Compazine as needed for nausea to be given in clinic -Continue PRN-anti-emetics   Protein malnutrition -Secondary to chemoradiation -Weight down by 3 lbs over the past week -No feeding tube due to lack of social support and patient preference  -Patient currently reports eating chicken and vegetable soups as well as 6-7 bottles of 16 ounce water per day -Due to generalized weakness and weight loss, I have ordered IV fluid support with 1L NS for the remainder of the week and next week until the clinic appointment -I counseled the patient on the importance of maintaining adequate nutrition and hydration  No orders of the defined types were placed in this encounter.  All questions were answered. The patient knows to call the clinic with any problems, questions or concerns.  No barriers to learning was detected.  Return in 1 week  for labs, port flush, and clinic appointment prior to the last cycle of chemotherapy.  Gloria Men, MD 01/23/2019 2:44 PM  CHIEF COMPLAINT: "I am feeling pretty weak"  INTERVAL HISTORY: Gloria Murray returns to clinic for follow-up of squamous cell carcinoma of the supraglottis on definitive chemoradiation.  Patient reports that over the past week, she has felt more generalized weakness, limiting her ability to perform her normal activities.  She reports that she is able to drink chicken and vegetable soup without significant dysphasia or odynophagia, as well at least 6-7 bottles of 16 ounce water per day.  She has been having more difficulty with nausea, for which she takes Zofran and Compazine as needed with reasonable relief of the nausea.  She has not had any vomiting.  She is running out of her anti-medics and requests her medications to be mailed to her.  She also has difficulty with thick mucus, for which she does salt/soda rinses approximately 3-4 times a day.  She denies any other complaint today.  SUMMARY OF ONCOLOGIC HISTORY: Oncology History  Malignant neoplasm of supraglottis ([place])  10/26/2018 Imaging   CT neck w/ contrast: IMPRESSION: 1. Bulky, partially necrotic bilateral cervical lymphadenopathy most concerning for metastatic disease, less likely unusual infection. 2. Prominent soft tissue at the tongue base with asymmetric left aryepiglottic fold thickening and slight fullness of the left palatine tonsil. Direct visualization is recommended to assess for a primary mucosal malignancy. 3.  Aortic Atherosclerosis (ICD10-I70.0).   11/08/2018 Procedure   DL with biopsy   11/08/2018 Pathology Results   Accession: [id number] 4. Larynx, biopsy, Left Supraglottis - INVASIVE SQUAMOUS CELL CARCINOMA. 2. Larynx, biopsy, Left Ary Epiglottic Fold - INVASIVE SQUAMOUS CELL CARCINOMA. 3. Tongue, biopsy, Left Vallecular Base - INVASIVE SQUAMOUS CELL CARCINOMA. 4. Larynx, biopsy, Right Vallecular  Epiglottis - INVASIVE SQUAMOUS CELL CARCINOMA.   11/16/2018 Initial Diagnosis   Laryngeal cancer ([place])   11/23/2018 Imaging   PET: IMPRESSION: 1. Intensely hypermetabolic supraglottic mass predominantly involving the LEFT aryepiglottic fold but extending across midline anteriorly. Inferiorly hypermetabolic activity extends to the level of the larynx on the LEFT. 2. Bilateral bulky intensely hypermetabolic metastatic level II lymph nodes. Metastatic adenopathy extends with smaller nodes in the level III nodal stations bilaterally. 3. No evidence of thoracic metastasis. 4. Enlarged LEFT adrenal gland consistent with hyperplasia or lipid poor adenoma.   11/27/2018 Cancer Staging   Staging form: Larynx - Supraglottis, AJCC 8th Edition - Clinical stage from 11/27/2018: Stage IVB (cT2, cN3b, cM0) - Signed by Eppie Gibson, MD on 11/27/2018   12/13/2018 -  Chemotherapy   The patient had palonosetron (ALOXI) injection 0.25 mg, 0.25 mg, Intravenous,  Once, 5 of 7 cycles Administration: 0.25 mg (12/13/2018), 0.25 mg (12/20/2018), 0.25 mg (12/27/2018), 0.25 mg (01/03/2019), 0.25 mg (01/10/2019) CARBOplatin (PARAPLATIN) 210 mg in sodium chloride 0.9 % 250 mL chemo infusion, 210 mg (100 % of original dose 213.8 mg), Intravenous,  Once, 5 of 7 cycles Dose modification:   (original dose 213.8 mg, Cycle 1) Administration: 210 mg (12/13/2018), 210 mg (12/20/2018), 210 mg (12/27/2018), 210 mg (01/03/2019), 210 mg (01/10/2019)  for chemotherapy treatment.      REVIEW OF SYSTEMS:   Constitutional: ( - ) fevers, ( - )  chills , ( - ) night sweats Eyes: ( - ) blurriness of vision, ( - ) double vision, ( - ) watery eyes Ears, nose, mouth, throat, and face: ( - )  mucositis, ( - ) sore throat Respiratory: ( - ) cough, ( - ) dyspnea, ( - ) wheezes Cardiovascular: ( - ) palpitation, ( - ) chest discomfort, ( - ) lower extremity swelling Gastrointestinal:  ( + ) nausea, ( - ) heartburn, ( - ) change in bowel habits Skin:  ( - ) abnormal skin rashes Lymphatics: ( - ) new lymphadenopathy, ( - ) easy bruising Neurological: ( - ) numbness, ( - ) tingling, ( - ) new weaknesses Behavioral/Psych: ( - ) mood change, ( - ) new changes  All other systems were reviewed with the patient and are negative.  I have reviewed the past medical history, past surgical history, social history and family history with the patient and they are unchanged from previous note.  ALLERGIES:  is allergic to propoxyphene hcl and propoxyphene n-acetaminophen.  MEDICATIONS:  Current Outpatient Medications  Medication Sig Dispense Refill  . Ascorbic Acid (VITAMIN C PO) Take 1 tablet by mouth daily.    Marland Kitchen aspirin EC 81 MG tablet Take 81 mg by mouth daily.    Marland Kitchen CALCIUM PO Take 1 tablet by mouth daily.    . Cholecalciferol (VITAMIN D3 PO) Take 1 tablet by mouth daily.    . clopidogrel (PLAVIX) 75 MG tablet TAKE 1 TABLET (75 MG TOTAL) BY MOUTH DAILY. 90 tablet 3  . dexamethasone (DECADRON) 4 MG tablet Take 2 tablets (8 mg total) by mouth daily. Start the day after chemotherapy for 2 days. Take with food. 30 tablet 1  . fexofenadine (ALLEGRA) 180 MG tablet Take 180 mg by mouth at bedtime.     . fluticasone (FLONASE) 50 MCG/ACT nasal spray Place 1 spray into both nostrils daily.     . Garlic 7322 MG CAPS Take 1 capsule by mouth 2 (two) times daily.      . hydrochlorothiazide (MICROZIDE) 12.5 MG capsule TAKE 1 CAPSULE EVERY DAY 90 capsule 0  . levothyroxine (SYNTHROID) 112 MCG tablet TAKE 1 TABLET EVERY DAY 90 tablet 0  . lidocaine (XYLOCAINE) 2 % solution Patient: Mix 1part 2% viscous lidocaine, 1part H20. Swish & swallow 45mL of diluted mixture, 55min before meals and at bedtime, up to QID 100 mL 5  . lidocaine-prilocaine (EMLA) cream Apply to affected area once 30 g 3  . LORazepam (ATIVAN) 0.5 MG tablet Take 1 tablet (0.5 mg total) by mouth every 6 (six) hours as needed (Nausea or vomiting). 30 tablet 0  . metoprolol succinate (TOPROL-XL) 25 MG  24 hr tablet TAKE 1 TABLET EVERY DAY 90 tablet 0  . Omega-3 Fatty Acids (FISH OIL) 1000 MG CAPS Take 1 capsule (1,000 mg total) by mouth daily.    . ondansetron (ZOFRAN) 8 MG tablet Take 1 tablet (8 mg total) by mouth 2 (two) times daily as needed for refractory nausea / vomiting. Start on day 3 after chemotherapy. 30 tablet 1  . prochlorperazine (COMPAZINE) 10 MG tablet Take 1 tablet (10 mg total) by mouth every 6 (six) hours as needed (Nausea or vomiting). 30 tablet 1  . simvastatin (ZOCOR) 40 MG tablet TAKE 1 TABLET AT BEDTIME 90 tablet 0  . sodium fluoride (PREVIDENT 5000 PLUS) 1.1 % CREA dental cream Apply cream to tooth brush. Brush teeth for 2 minutes. Spit out excess. DO NOT rinse afterwards. Repeat nightly. 1 Tube prn  . traMADol (ULTRAM) 50 MG tablet Take 1 tablet (50 mg total) by mouth every 8 (eight) hours as needed. Mail to the patient please. Patient has [employer]  of $700.00. 60 tablet 1  . VITAMIN E PO Take 1 capsule by mouth daily.     No current facility-administered medications for this visit.    Facility-Administered Medications Ordered in Other Visits  Medication Dose Route Frequency Provider Last Rate Last Dose  . alteplase (CATHFLO ACTIVASE) injection 2 mg  2 mg Intracatheter Once PRN Gloria Men, MD      . heparin lock flush 100 unit/mL  500 Units Intracatheter Once PRN Gloria Men, MD      . heparin lock flush 100 unit/mL  250 Units Intracatheter Once PRN Gloria Men, MD      . magnesium sulfate IVPB 4 g 100 mL  4 g Intravenous Once Gloria Men, MD      . magnesium sulfate IVPB 4 g 100 mL  4 g Intravenous Once Gloria Men, MD      . prochlorperazine (COMPAZINE) injection 10 mg  10 mg Intravenous PRN Gloria Men, MD      . sodium chloride flush (NS) 0.9 % injection 10 mL  10 mL Intracatheter Once PRN Gloria Men, MD      . sodium chloride flush (NS) 0.9 % injection 3 mL  3 mL Intracatheter Once PRN Gloria Men, MD        PHYSICAL EXAMINATION: ECOG PERFORMANCE STATUS: 2 - Symptomatic,  <50% confined to bed  Today's Vitals   01/23/19 1339 01/23/19 1350  BP: 95/62   Pulse: 89   Temp: 98.8 F (37.1 C)   TempSrc: Temporal   SpO2: 99%   Height: 5\' 5"  (1.651 m)   PainSc:  0-No pain   Body mass index is 31.23 kg/m.  There were no vitals filed for this visit.  GENERAL: alert, no distress, frail appearing sitting in a wheelchair SKIN: erythematous skin over bilateral neck secondary to radiation EYES: conjunctiva are pink and non-injected, sclera clear OROPHARYNX: no exudate, no erythema; lips, buccal mucosa, and tongue normal  NECK: supple, non-tender LYMPH: right bulky cervical adenopathy, stable in size measuring ~4cm, nontender, mostly non-mobile LUNGS: clear to auscultation with normal breathing effort HEART: regular rate & rhythm and no murmurs and no lower extremity edema ABDOMEN: soft, non-tender, non-distended, normal bowel sounds Musculoskeletal: no cyanosis of digits and no clubbing  PSYCH: alert & oriented x 3, fluent speech  LABORATORY DATA:  I have reviewed the data as listed    Component Value Date/Time   NA 139 01/23/2019 1314   K 3.3 (L) 01/23/2019 1314   CL 96 (L) 01/23/2019 1314   CO2 28 01/23/2019 1314   GLUCOSE 81 01/23/2019 1314   BUN 11 01/23/2019 1314   CREATININE 0.77 01/23/2019 1314   CREATININE 0.89 10/06/2017 1504   CALCIUM 9.8 01/23/2019 1314   PROT 7.2 11/21/2018 1251   ALBUMIN 4.1 11/21/2018 1251   AST 15 11/21/2018 1251   ALT 17 11/21/2018 1251   ALKPHOS 67 11/21/2018 1251   BILITOT 0.4 11/21/2018 1251   GFRNONAA >60 01/23/2019 1314   GFRAA >60 01/23/2019 1314    No results found for: SPEP, UPEP  Lab Results  Component Value Date   WBC 0.8 (LL) 01/23/2019   NEUTROABS 0.5 (L) 01/23/2019   HGB 11.7 (L) 01/23/2019   HCT 32.9 (L) 01/23/2019   MCV 90.9 01/23/2019   PLT 90 (L) 01/23/2019      Chemistry      Component Value Date/Time   NA 139 01/23/2019 1314   K 3.3 (L) 01/23/2019 1314   CL 96 (L) 01/23/2019 1314  CO2 28 01/23/2019 1314   BUN 11 01/23/2019 1314   CREATININE 0.77 01/23/2019 1314   CREATININE 0.89 10/06/2017 1504      Component Value Date/Time   CALCIUM 9.8 01/23/2019 1314   ALKPHOS 67 11/21/2018 1251   AST 15 11/21/2018 1251   ALT 17 11/21/2018 1251   BILITOT 0.4 11/21/2018 1251

## 2019-01-23 NOTE — Patient Instructions (Signed)

## 2019-01-23 NOTE — Telephone Encounter (Signed)
Noted. IV mg ordered.  Dr. Maylon Peppers

## 2019-01-23 NOTE — Progress Notes (Signed)
I have contacted patient however, she was unavailable. I could not leave a message because her mailbox is full.

## 2019-01-24 ENCOUNTER — Ambulatory Visit
Admission: RE | Admit: 2019-01-24 | Discharge: 2019-01-24 | Disposition: A | Discharge status: Home / Self Care | Payer: Medicare HMO | Source: Ambulatory Visit | Attending: Radiation Oncology | Admitting: Radiation Oncology

## 2019-01-24 ENCOUNTER — Inpatient Hospital Stay: Payer: Medicare HMO

## 2019-01-24 ENCOUNTER — Other Ambulatory Visit: Payer: Self-pay

## 2019-01-24 VITALS — BP 119/74 | HR 93 | Temp 98.9°F | Resp 20

## 2019-01-24 DIAGNOSIS — E890 Postprocedural hypothyroidism: Secondary | ICD-10-CM | POA: Diagnosis not present

## 2019-01-24 DIAGNOSIS — R112 Nausea with vomiting, unspecified: Secondary | ICD-10-CM | POA: Diagnosis not present

## 2019-01-24 DIAGNOSIS — Z951 Presence of aortocoronary bypass graft: Secondary | ICD-10-CM | POA: Diagnosis not present

## 2019-01-24 DIAGNOSIS — Z79899 Other long term (current) drug therapy: Secondary | ICD-10-CM | POA: Diagnosis not present

## 2019-01-24 DIAGNOSIS — Z95828 Presence of other vascular implants and grafts: Secondary | ICD-10-CM

## 2019-01-24 DIAGNOSIS — D701 Agranulocytosis secondary to cancer chemotherapy: Secondary | ICD-10-CM | POA: Diagnosis not present

## 2019-01-24 DIAGNOSIS — Z87891 Personal history of nicotine dependence: Secondary | ICD-10-CM | POA: Diagnosis not present

## 2019-01-24 DIAGNOSIS — Z510 Encounter for antineoplastic radiation therapy: Secondary | ICD-10-CM | POA: Diagnosis not present

## 2019-01-24 DIAGNOSIS — Z5111 Encounter for antineoplastic chemotherapy: Secondary | ICD-10-CM | POA: Diagnosis not present

## 2019-01-24 DIAGNOSIS — I10 Essential (primary) hypertension: Secondary | ICD-10-CM | POA: Diagnosis not present

## 2019-01-24 DIAGNOSIS — Z7902 Long term (current) use of antithrombotics/antiplatelets: Secondary | ICD-10-CM | POA: Diagnosis not present

## 2019-01-24 DIAGNOSIS — C321 Malignant neoplasm of supraglottis: Secondary | ICD-10-CM

## 2019-01-24 DIAGNOSIS — Z79890 Hormone replacement therapy (postmenopausal): Secondary | ICD-10-CM | POA: Diagnosis not present

## 2019-01-24 DIAGNOSIS — R634 Abnormal weight loss: Secondary | ICD-10-CM | POA: Diagnosis not present

## 2019-01-24 DIAGNOSIS — E46 Unspecified protein-calorie malnutrition: Secondary | ICD-10-CM | POA: Diagnosis not present

## 2019-01-24 DIAGNOSIS — T451X5A Adverse effect of antineoplastic and immunosuppressive drugs, initial encounter: Secondary | ICD-10-CM | POA: Diagnosis not present

## 2019-01-24 MED ORDER — PROCHLORPERAZINE EDISYLATE 10 MG/2ML IJ SOLN
INTRAMUSCULAR | Status: AC
  Filled 2019-01-24: qty 2

## 2019-01-24 MED ORDER — HEPARIN SOD (PORK) LOCK FLUSH 100 UNIT/ML IV SOLN
500.0000 [IU] | Freq: Once | INTRAVENOUS | Status: AC
  Administered 2019-01-24: 500 [IU]
  Filled 2019-01-24: qty 5

## 2019-01-24 MED ORDER — SODIUM CHLORIDE 0.9 % IV SOLN
Freq: Once | INTRAVENOUS | Status: AC
  Administered 2019-01-24: 08:00:00 via INTRAVENOUS
  Filled 2019-01-24: qty 1000

## 2019-01-24 MED ORDER — SODIUM CHLORIDE 0.9% FLUSH
10.0000 mL | Freq: Once | INTRAVENOUS | Status: AC
  Administered 2019-01-24: 10 mL
  Filled 2019-01-24: qty 10

## 2019-01-24 MED ORDER — PROCHLORPERAZINE EDISYLATE 10 MG/2ML IJ SOLN
10.0000 mg | INTRAMUSCULAR | Status: DC | PRN
  Administered 2019-01-24: 10 mg via INTRAVENOUS
  Filled 2019-01-24: qty 2

## 2019-01-24 NOTE — Progress Notes (Signed)
Pt. had nausea and small amount of emesis, given Compazine 10mg  IV over 5 minutes per orders.  Pt. states the medication made her feel "restless" and a little "short of breath"  Respirations 20. Oxygen 2 liters/nasal cannula applied. Pt. states she takes the pill at home and does not have any side effects.  Per [facility] in Pharmacy do not need to add to Allergy list at this time.

## 2019-01-24 NOTE — Patient Instructions (Signed)
[facility] Discharge Instructions for Patients Receiving Chemotherapy  Today you received the following: IV fluids, Magnesium, and Compazine  To help prevent nausea and vomiting after your treatment, we encourage you to take your nausea medication as directed by your MD.   If you develop nausea and vomiting that is not controlled by your nausea medication, call the clinic.   BELOW ARE SYMPTOMS THAT SHOULD BE REPORTED IMMEDIATELY:  *FEVER GREATER THAN 100.5 F  *CHILLS WITH OR WITHOUT FEVER  NAUSEA AND VOMITING THAT IS NOT CONTROLLED WITH YOUR NAUSEA MEDICATION  *UNUSUAL SHORTNESS OF BREATH  *UNUSUAL BRUISING OR BLEEDING  TENDERNESS IN MOUTH AND THROAT WITH OR WITHOUT PRESENCE OF ULCERS  *URINARY PROBLEMS  *BOWEL PROBLEMS  UNUSUAL RASH Items with * indicate a potential emergency and should be followed up as soon as possible.  Feel free to call the clinic should you have any questions or concerns. The clinic phone number is [phone].  Please show the [facility] at check-in to the Emergency Department and triage nurse.   Dehydration  Dehydration is when there is not enough fluid or water in your body. This happens when you lose more fluids than you take in. People who are age 69 or older have a higher risk of getting dehydrated. Dehydration can range from mild to very bad. It should be treated right away to keep it from getting very bad. Symptoms of mild dehydration may include:  Thirst.  Dry lips.  Slightly dry mouth.  Dry, warm skin.  Dizziness. Symptoms of moderate dehydration may include:  Very dry mouth.  Muscle cramps.  Dark pee (urine). Pee may be the color of tea.  Your body making less pee.  Your eyes making fewer tears.  Heartbeat that is uneven or faster than normal (palpitations).  Headache.  Light-headedness, especially when you stand up from sitting.  Fainting (syncope). Symptoms of very bad dehydration may  include:  Changes in skin, such as: ? Cold and clammy skin. ? Blotchy (mottled) or pale skin. ? Skin that does not quickly return to normal after being lightly pinched and let go (poor skin turgor).  Changes in body fluids, such as: ? Feeling very thirsty. ? Your eyes making fewer tears. ? Not sweating when body temperature is high, such as in hot weather. ? Your body making very little pee.  Changes in vital signs, such as: ? Weak pulse. ? Pulse that is more than 100 beats a minute when you are sitting still. ? Fast breathing. ? Low blood pressure.  Other changes, such as: ? Sunken eyes. ? Cold hands and feet. ? Confusion. ? Lack of energy (lethargy). ? Trouble waking up from sleep. ? Short-term weight loss. ? Unconsciousness. Follow these instructions at home:   If told by your doctor, drink an ORS: ? Make an ORS by using instructions on the package. ? Start by drinking small amounts, about  cup (120 mL) every 5-10 minutes. ? Slowly drink more until you have had the amount that your doctor said to have.  Drink enough clear fluid to keep your pee clear or pale yellow. If you were told to drink an ORS, finish the ORS first, then start slowly drinking clear fluids. Drink fluids such as: ? Water. Do not drink only water by itself. Doing that can make the salt (sodium) level in your body get too low (hyponatremia). ? Ice chips. ? Fruit juice that you have added water to (diluted). ? Low-calorie sports  drinks.  Avoid: ? Alcohol. ? Drinks that have a lot of sugar. These include high-calorie sports drinks, fruit juice that does not have water added, and soda. ? Caffeine. ? Foods that are greasy or have a lot of fat or sugar.  Take over-the-counter and prescription medicines only as told by your doctor.  Do not take salt tablets. Doing that can make the salt level in your body get too high (hypernatremia).  Eat foods that have minerals (electrolytes). Examples include  bananas, oranges, potatoes, tomatoes, and spinach.  Keep all follow-up visits as told by your doctor. This is important. Contact a doctor if:  You have belly (abdominal) pain that: ? Gets worse. ? Stays in one area (localizes).  You have a rash.  You have a stiff neck.  You get angry or annoyed more easily than normal (irritability).  You are more sleepy than normal.  You have a harder time waking up than normal.  You feel: ? Weak. ? Dizzy. ? Very thirsty. Get help right away if:  You have symptoms of very bad dehydration.  You cannot drink fluids without throwing up (vomiting).  Your symptoms get worse with treatment.  You have a fever.  You have a very bad headache.  You are throwing up or having watery poop (diarrhea) and it: ? Gets worse. ? Does not go away.  You have diarrhea for more than 24 hours.  You have blood or something green (bile) in your throw-up.  You have blood in your poop (stool). This may cause poop to look black and tarry.  You have not peed in 6-8 hours.  You have peed (urinated) only a small amount of very dark pee during 6-8 hours.  You pass out (faint).  Your heart rate when you are sitting still is more than 100 beats a minute.  You have trouble breathing. This information is not intended to replace advice given to you by your health care provider. Make sure you discuss any questions you have with your health care provider. Document Released: 07/07/2011 Document Revised: 02/05/2016 Document Reviewed: 09/11/2015 Elsevier Interactive Patient Education  2019 [place] (COVID-19) Are you at risk?  Are you at risk for the Coronavirus (COVID-19)?  To be considered HIGH RISK for Coronavirus (COVID-19), you have to meet the following criteria:  . Traveled to [place], [place], [place], [place] or [place]; or in the [place] to [place], [place], [place], or [place]; and have fever, cough, and shortness of  breath within the last 2 weeks of travel OR . Been in close contact with a person diagnosed with COVID-19 within the last 2 weeks and have fever, cough, and shortness of breath . IF YOU DO NOT MEET THESE CRITERIA, YOU ARE CONSIDERED LOW RISK FOR COVID-19.  What to do if you are HIGH RISK for COVID-19?  Marland Kitchen If you are having a medical emergency, call 911. . Seek medical care right away. Before you go to a doctor's office, urgent care or emergency department, call ahead and tell them about your recent travel, contact with someone diagnosed with COVID-19, and your symptoms. You should receive instructions from your physician's office regarding next steps of care.  . When you arrive at healthcare provider, tell the healthcare staff immediately you have returned from visiting [place], [place], [place], [place] or [place]; or traveled in the [place] to [place], [place], [place], or [place]; in the last two weeks or you have been  in close contact with a person diagnosed with COVID-19 in the last 2 weeks.   . Tell the health care staff about your symptoms: fever, cough and shortness of breath. . After you have been seen by a medical provider, you will be either: o Tested for (COVID-19) and discharged home on quarantine except to seek medical care if symptoms worsen, and asked to  - Stay home and avoid contact with others until you get your results (4-5 days)  - Avoid travel on public transportation if possible (such as bus, train, or airplane) or o Sent to the Emergency Department by EMS for evaluation, COVID-19 testing, and possible admission depending on your condition and test results.  What to do if you are LOW RISK for COVID-19?  Reduce your risk of any infection by using the same precautions used for avoiding the common cold or flu:  Marland Kitchen Wash your hands often with soap and warm water for at least 20 seconds.  If soap and water are not readily available, use an alcohol-based hand sanitizer  with at least 60% alcohol.  . If coughing or sneezing, cover your mouth and nose by coughing or sneezing into the elbow areas of your shirt or coat, into a tissue or into your sleeve (not your hands). . Avoid shaking hands with others and consider head nods or verbal greetings only. . Avoid touching your eyes, nose, or mouth with unwashed hands.  . Avoid close contact with people who are sick. . Avoid places or events with large numbers of people in one location, like concerts or sporting events. . Carefully consider travel plans you have or are making. . If you are planning any travel outside or inside the [place], visit the CDC's Travelers' Health webpage for the latest health notices. . If you have some symptoms but not all symptoms, continue to monitor at home and seek medical attention if your symptoms worsen. . If you are having a medical emergency, call 911.   [place] / e-Visit: [email or web address]         [facility]: [facility]: [phone]                   [facility]: [phone]

## 2019-01-25 ENCOUNTER — Other Ambulatory Visit: Payer: Self-pay | Admitting: Hematology

## 2019-01-25 ENCOUNTER — Other Ambulatory Visit: Payer: Self-pay

## 2019-01-25 ENCOUNTER — Ambulatory Visit
Admission: RE | Admit: 2019-01-25 | Discharge: 2019-01-25 | Disposition: A | Discharge status: Home / Self Care | Payer: Medicare HMO | Source: Ambulatory Visit | Attending: Radiation Oncology | Admitting: Radiation Oncology

## 2019-01-25 ENCOUNTER — Inpatient Hospital Stay: Payer: Medicare HMO

## 2019-01-25 VITALS — BP 136/84 | HR 98 | Temp 98.9°F | Resp 20

## 2019-01-25 DIAGNOSIS — C321 Malignant neoplasm of supraglottis: Secondary | ICD-10-CM | POA: Diagnosis not present

## 2019-01-25 DIAGNOSIS — Z79890 Hormone replacement therapy (postmenopausal): Secondary | ICD-10-CM | POA: Diagnosis not present

## 2019-01-25 DIAGNOSIS — I10 Essential (primary) hypertension: Secondary | ICD-10-CM | POA: Diagnosis not present

## 2019-01-25 DIAGNOSIS — E890 Postprocedural hypothyroidism: Secondary | ICD-10-CM | POA: Diagnosis not present

## 2019-01-25 DIAGNOSIS — D701 Agranulocytosis secondary to cancer chemotherapy: Secondary | ICD-10-CM | POA: Diagnosis not present

## 2019-01-25 DIAGNOSIS — Z5111 Encounter for antineoplastic chemotherapy: Secondary | ICD-10-CM | POA: Diagnosis not present

## 2019-01-25 DIAGNOSIS — Z7902 Long term (current) use of antithrombotics/antiplatelets: Secondary | ICD-10-CM | POA: Diagnosis not present

## 2019-01-25 DIAGNOSIS — Z79899 Other long term (current) drug therapy: Secondary | ICD-10-CM | POA: Diagnosis not present

## 2019-01-25 DIAGNOSIS — E46 Unspecified protein-calorie malnutrition: Secondary | ICD-10-CM | POA: Diagnosis not present

## 2019-01-25 DIAGNOSIS — R634 Abnormal weight loss: Secondary | ICD-10-CM | POA: Diagnosis not present

## 2019-01-25 DIAGNOSIS — Z87891 Personal history of nicotine dependence: Secondary | ICD-10-CM | POA: Diagnosis not present

## 2019-01-25 DIAGNOSIS — Z951 Presence of aortocoronary bypass graft: Secondary | ICD-10-CM | POA: Diagnosis not present

## 2019-01-25 DIAGNOSIS — T451X5A Adverse effect of antineoplastic and immunosuppressive drugs, initial encounter: Secondary | ICD-10-CM | POA: Diagnosis not present

## 2019-01-25 DIAGNOSIS — R112 Nausea with vomiting, unspecified: Secondary | ICD-10-CM | POA: Diagnosis not present

## 2019-01-25 DIAGNOSIS — Z95828 Presence of other vascular implants and grafts: Secondary | ICD-10-CM

## 2019-01-25 DIAGNOSIS — Z510 Encounter for antineoplastic radiation therapy: Secondary | ICD-10-CM | POA: Diagnosis not present

## 2019-01-25 MED ORDER — SODIUM CHLORIDE 0.9% FLUSH
3.0000 mL | Freq: Once | INTRAVENOUS | Status: DC | PRN
  Filled 2019-01-25: qty 10

## 2019-01-25 MED ORDER — ONDANSETRON HCL 4 MG/2ML IJ SOLN
INTRAMUSCULAR | Status: AC
  Filled 2019-01-25: qty 4

## 2019-01-25 MED ORDER — SODIUM CHLORIDE 0.9 % IV SOLN
Freq: Once | INTRAVENOUS | Status: AC
  Administered 2019-01-25: 16:00:00 via INTRAVENOUS
  Filled 2019-01-25: qty 250

## 2019-01-25 MED ORDER — SODIUM CHLORIDE 0.9 % IV SOLN: 8.0000 mg | Freq: Every day | INTRAVENOUS | Status: DC

## 2019-01-25 MED ORDER — HEPARIN SOD (PORK) LOCK FLUSH 100 UNIT/ML IV SOLN
500.0000 [IU] | Freq: Once | INTRAVENOUS | Status: AC | PRN
  Administered 2019-01-25: 500 [IU]
  Filled 2019-01-25: qty 5

## 2019-01-25 MED ORDER — ONDANSETRON HCL 4 MG/2ML IJ SOLN
8.0000 mg | Freq: Once | INTRAMUSCULAR | Status: AC
  Administered 2019-01-25: 8 mg via INTRAVENOUS

## 2019-01-25 MED ORDER — SODIUM CHLORIDE 0.9% FLUSH
10.0000 mL | Freq: Once | INTRAVENOUS | Status: AC | PRN
  Administered 2019-01-25: 10 mL
  Filled 2019-01-25: qty 10

## 2019-01-25 MED ORDER — ALTEPLASE 2 MG IJ SOLR
2.0000 mg | Freq: Once | INTRAMUSCULAR | Status: DC | PRN
  Filled 2019-01-25: qty 2

## 2019-01-25 MED ORDER — HEPARIN SOD (PORK) LOCK FLUSH 100 UNIT/ML IV SOLN
250.0000 [IU] | Freq: Once | INTRAVENOUS | Status: DC | PRN
  Filled 2019-01-25: qty 5

## 2019-01-25 NOTE — Patient Instructions (Signed)

## 2019-01-26 ENCOUNTER — Inpatient Hospital Stay: Payer: Medicare HMO

## 2019-01-28 ENCOUNTER — Ambulatory Visit
Admission: RE | Admit: 2019-01-28 | Discharge: 2019-01-28 | Disposition: A | Discharge status: Home / Self Care | Payer: Medicare HMO | Source: Ambulatory Visit | Attending: Radiation Oncology | Admitting: Radiation Oncology

## 2019-01-28 ENCOUNTER — Ambulatory Visit: Payer: Medicare HMO

## 2019-01-28 ENCOUNTER — Inpatient Hospital Stay: Payer: Medicare HMO

## 2019-01-28 ENCOUNTER — Other Ambulatory Visit: Payer: Self-pay

## 2019-01-28 ENCOUNTER — Other Ambulatory Visit: Payer: Self-pay | Admitting: Radiation Oncology

## 2019-01-28 VITALS — BP 100/67 | HR 96 | Temp 98.6°F | Resp 20

## 2019-01-28 DIAGNOSIS — C321 Malignant neoplasm of supraglottis: Secondary | ICD-10-CM

## 2019-01-28 DIAGNOSIS — Z5111 Encounter for antineoplastic chemotherapy: Secondary | ICD-10-CM | POA: Diagnosis not present

## 2019-01-28 DIAGNOSIS — D701 Agranulocytosis secondary to cancer chemotherapy: Secondary | ICD-10-CM | POA: Diagnosis not present

## 2019-01-28 DIAGNOSIS — E890 Postprocedural hypothyroidism: Secondary | ICD-10-CM | POA: Diagnosis not present

## 2019-01-28 DIAGNOSIS — R634 Abnormal weight loss: Secondary | ICD-10-CM | POA: Diagnosis not present

## 2019-01-28 DIAGNOSIS — Z87891 Personal history of nicotine dependence: Secondary | ICD-10-CM | POA: Diagnosis not present

## 2019-01-28 DIAGNOSIS — R112 Nausea with vomiting, unspecified: Secondary | ICD-10-CM | POA: Diagnosis not present

## 2019-01-28 DIAGNOSIS — Z79899 Other long term (current) drug therapy: Secondary | ICD-10-CM | POA: Diagnosis not present

## 2019-01-28 DIAGNOSIS — T451X5A Adverse effect of antineoplastic and immunosuppressive drugs, initial encounter: Secondary | ICD-10-CM | POA: Diagnosis not present

## 2019-01-28 DIAGNOSIS — Z510 Encounter for antineoplastic radiation therapy: Secondary | ICD-10-CM | POA: Diagnosis not present

## 2019-01-28 DIAGNOSIS — E46 Unspecified protein-calorie malnutrition: Secondary | ICD-10-CM | POA: Diagnosis not present

## 2019-01-28 DIAGNOSIS — Z95828 Presence of other vascular implants and grafts: Secondary | ICD-10-CM

## 2019-01-28 DIAGNOSIS — Z79890 Hormone replacement therapy (postmenopausal): Secondary | ICD-10-CM | POA: Diagnosis not present

## 2019-01-28 DIAGNOSIS — I10 Essential (primary) hypertension: Secondary | ICD-10-CM | POA: Diagnosis not present

## 2019-01-28 DIAGNOSIS — Z951 Presence of aortocoronary bypass graft: Secondary | ICD-10-CM | POA: Diagnosis not present

## 2019-01-28 DIAGNOSIS — Z7902 Long term (current) use of antithrombotics/antiplatelets: Secondary | ICD-10-CM | POA: Diagnosis not present

## 2019-01-28 MED ORDER — SODIUM CHLORIDE 0.9% FLUSH
10.0000 mL | Freq: Once | INTRAVENOUS | Status: AC
  Administered 2019-01-28: 10 mL
  Filled 2019-01-28: qty 10

## 2019-01-28 MED ORDER — HEPARIN SOD (PORK) LOCK FLUSH 100 UNIT/ML IV SOLN
500.0000 [IU] | Freq: Once | INTRAVENOUS | Status: AC
  Administered 2019-01-28: 500 [IU]
  Filled 2019-01-28: qty 5

## 2019-01-28 MED ORDER — SODIUM CHLORIDE 0.9 % IV SOLN
Freq: Once | INTRAVENOUS | Status: AC
  Administered 2019-01-28: 16:00:00 via INTRAVENOUS
  Filled 2019-01-28: qty 250

## 2019-01-28 NOTE — Patient Instructions (Signed)

## 2019-01-29 ENCOUNTER — Inpatient Hospital Stay: Payer: Medicare HMO

## 2019-01-29 ENCOUNTER — Other Ambulatory Visit: Payer: Self-pay | Admitting: Radiation Oncology

## 2019-01-29 ENCOUNTER — Ambulatory Visit: Payer: Medicare HMO

## 2019-01-29 ENCOUNTER — Inpatient Hospital Stay: Payer: Medicare HMO | Admitting: Nurse Practitioner

## 2019-01-29 DIAGNOSIS — C321 Malignant neoplasm of supraglottis: Secondary | ICD-10-CM

## 2019-01-29 NOTE — Progress Notes (Signed)
[facility] OFFICE PROGRESS NOTE  Patient Care Team: Jearld Fenton, NP as PCP - General (Internal Medicine) Irene Shipper, MD as Consulting Physician (Gastroenterology) Early, Arvilla Meres, MD as Consulting Physician (Vascular Surgery) Serafina Mitchell, MD as Consulting Physician (Vascular Surgery) Eppie Gibson, MD as Attending Physician (Radiation Oncology) Leota Sauers, RN as Oncology Nurse Navigator (Oncology) Karie Mainland, RD as Dietitian (Nutrition) Sharen Counter, CCC-SLP as Speech Language Pathologist (Speech Pathology) Kennith Center, LCSW as Social Worker  HEME/ONC OVERVIEW: 1. 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 R Level II LN 5 x 3.3cm; L Level II LN 5.4 x 2.7cm w/ suspicion for ECE) -10/2018: DL with bx by Dr. Redmond Baseman showed a mass involving the left aryepiglottic fold extending up to the epiglottic and BOT, bx showed invasive squamous cell carcinoma; PET showed FDG-avid supraglottic malignancy and bilateral bulky FDG-avid Level II and III cervical LN's, no mets  -Late 10/2018 - present: definitive chemoradiation with weekly carboplatin    2. 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 -S/p 5 cycles of weekly carboplatin concurrent with RT in the definitive setting; treatment delayed due to severe cytopenias  -Due to severe, persistent leukopenia with neutropenia, as well as poor tolerance of chemotherapy, I have cancelled the reminder of chemotherapy  -RT completed on 6/30 -Patient declined feeding tube placement due to lack of social support  -On exam, right cervical lymphadenopathy is somewhat  smaller since starting treatment, but remains bulky and fixed; therefore, we will consider obtaining CT neck and CT in ~6-8 weeks after completing treatment to monitor for any persistent disease or progression  -PRN anti-emetics: Zofran, Compazine, Ativan and dexamethasone   Chemotherapy-associated leukopenia -Secondary to chemotherapy -WBC 0.7k with ANC ~400, persistently low -Patient denies any symptoms of infection -Due to severe persistent leukopenia with neutropenia, we will cancel the last dose of chemotherapy as discussed above -No indication for G-CSF unless patient develops any fever or other signs of infection    Hypomagnesemia -Secondary to electrolyte wasting from chemotherapy -Mg 1.4 today -I have ordered 4g IV Mg to be given with 1L NS today -Continue oral magnesium supplement    Hypokalemia -Secondary to electrolyte wasting from chemotherapy -K 2.9 today -We will administer PO KCl 25mEq daily, and have prescribed 66mEq daily to be sent to her pharmacy   Chemotherapy-associated nausea  -Secondary to chemotherapy and thick saliva -Symptoms reasonably controlled; she has also required periodic IV Zofran with fluid hydration for breakthrough nausea  -Continue PRN-anti-emetics   Protein malnutrition -Secondary to chemoradiation -Weight down by 8 lbs over the past two week -No feeding tube due to lack of social support and patient preference  -Patient currently reports eating chicken and vegetable soups as well as three and a half bottles of 16 ounce water per day -Due to poor PO intake, I will set up the patient for daily IV fluid (1L NS) through the reminder of this week and early next week  -I have reached out to the nutrition and see if we can provide her with nutritional supplements   No orders of the defined types were placed in this encounter.  All questions were  answered. The patient knows to call the clinic with any problems, questions or concerns. No barriers to  learning was detected.  Return in 1 week for labs, port flush, clinic appt and IV fluid support.   Tish Men, MD 01/30/2019 12:30 PM  CHIEF COMPLAINT: "I am just a little nauseous"  INTERVAL HISTORY: Gloria Murray returns to clinic for follow-up of squamous cell carcinoma of the supraglottic larynx on definitive chemoradiation with weekly carboplatin.  Patient reports that she has been having frequent nausea with periodic nonbloody, nonbilious emesis due to thick secretions.  She has been doing salt/soda rinses approximately twice a day.  Her oral intake consists of chicken salad sandwich, 2 cans of soup (cream of chicken, vegetable) and approximately three and half bottles of 16 ounce water per day.  She feels that her antiemetics are helping with her nausea.  She denies any pain with swallowing.  She otherwise denies any complaint today.  SUMMARY OF ONCOLOGIC HISTORY: Oncology History  Malignant neoplasm of supraglottis ([place])  10/26/2018 Imaging   CT neck w/ contrast: IMPRESSION: 1. Bulky, partially necrotic bilateral cervical lymphadenopathy most concerning for metastatic disease, less likely unusual infection. 2. Prominent soft tissue at the tongue base with asymmetric left aryepiglottic fold thickening and slight fullness of the left palatine tonsil. Direct visualization is recommended to assess for a primary mucosal malignancy. 3.  Aortic Atherosclerosis (ICD10-I70.0).   11/08/2018 Procedure   DL with biopsy   11/08/2018 Pathology Results   Accession: [id number] 6. Larynx, biopsy, Left Supraglottis - INVASIVE SQUAMOUS CELL CARCINOMA. 2. Larynx, biopsy, Left Ary Epiglottic Fold - INVASIVE SQUAMOUS CELL CARCINOMA. 3. Tongue, biopsy, Left Vallecular Base - INVASIVE SQUAMOUS CELL CARCINOMA. 4. Larynx, biopsy, Right Vallecular Epiglottis - INVASIVE SQUAMOUS CELL CARCINOMA.   11/16/2018 Initial Diagnosis   Laryngeal cancer ([place])   11/23/2018 Imaging   PET: IMPRESSION: 1. Intensely  hypermetabolic supraglottic mass predominantly involving the LEFT aryepiglottic fold but extending across midline anteriorly. Inferiorly hypermetabolic activity extends to the level of the larynx on the LEFT. 2. Bilateral bulky intensely hypermetabolic metastatic level II lymph nodes. Metastatic adenopathy extends with smaller nodes in the level III nodal stations bilaterally. 3. No evidence of thoracic metastasis. 4. Enlarged LEFT adrenal gland consistent with hyperplasia or lipid poor adenoma.   11/27/2018 Cancer Staging   Staging form: Larynx - Supraglottis, AJCC 8th Edition - Clinical stage from 11/27/2018: Stage IVB (cT2, cN3b, cM0) - Signed by Eppie Gibson, MD on 11/27/2018   12/13/2018 - 01/29/2019 Chemotherapy   The patient had palonosetron (ALOXI) injection 0.25 mg, 0.25 mg, Intravenous,  Once, 5 of 7 cycles Administration: 0.25 mg (12/13/2018), 0.25 mg (12/20/2018), 0.25 mg (12/27/2018), 0.25 mg (01/03/2019), 0.25 mg (01/10/2019) CARBOplatin (PARAPLATIN) 210 mg in sodium chloride 0.9 % 250 mL chemo infusion, 210 mg (100 % of original dose 213.8 mg), Intravenous,  Once, 5 of 7 cycles Dose modification:   (original dose 213.8 mg, Cycle 1) Administration: 210 mg (12/13/2018), 210 mg (12/20/2018), 210 mg (12/27/2018), 210 mg (01/03/2019), 210 mg (01/10/2019)  for chemotherapy treatment.      REVIEW OF SYSTEMS:   Constitutional: ( - ) fevers, ( - )  chills , ( - ) night sweats Eyes: ( - ) blurriness of vision, ( - ) double vision, ( - ) watery eyes Ears, nose, mouth, throat, and face: ( - ) mucositis, ( - ) sore throat Respiratory: ( - ) cough, ( - ) dyspnea, ( - ) wheezes Cardiovascular: ( - ) palpitation, ( - )  chest discomfort, ( - ) lower extremity swelling Gastrointestinal:  ( + ) nausea, ( - ) heartburn, ( - ) change in bowel habits Skin: ( - ) abnormal skin rashes Lymphatics: ( - ) new lymphadenopathy, ( - ) easy bruising Neurological: ( - ) numbness, ( - ) tingling, ( - ) new  weaknesses Behavioral/Psych: ( - ) mood change, ( - ) new changes  All other systems were reviewed with the patient and are negative.  I have reviewed the past medical history, past surgical history, social history and family history with the patient and they are unchanged from previous note.  ALLERGIES:  is allergic to propoxyphene hcl and propoxyphene n-acetaminophen.  MEDICATIONS:  Current Outpatient Medications  Medication Sig Dispense Refill  . Ascorbic Acid (VITAMIN C PO) Take 1 tablet by mouth daily.    Marland Kitchen aspirin EC 81 MG tablet Take 81 mg by mouth daily.    Marland Kitchen CALCIUM PO Take 1 tablet by mouth daily.    . Cholecalciferol (VITAMIN D3 PO) Take 1 tablet by mouth daily.    . clopidogrel (PLAVIX) 75 MG tablet TAKE 1 TABLET (75 MG TOTAL) BY MOUTH DAILY. 90 tablet 3  . fexofenadine (ALLEGRA) 180 MG tablet Take 180 mg by mouth at bedtime.     . fluticasone (FLONASE) 50 MCG/ACT nasal spray Place 1 spray into both nostrils daily.     . Garlic 7517 MG CAPS Take 1 capsule by mouth 2 (two) times daily.      . hydrochlorothiazide (MICROZIDE) 12.5 MG capsule TAKE 1 CAPSULE EVERY DAY 90 capsule 0  . levothyroxine (SYNTHROID) 112 MCG tablet TAKE 1 TABLET EVERY DAY 90 tablet 0  . lidocaine (XYLOCAINE) 2 % solution Patient: Mix 1part 2% viscous lidocaine, 1part H20. Swish & swallow 5mL of diluted mixture, 64min before meals and at bedtime, up to QID 100 mL 5  . lidocaine-prilocaine (EMLA) cream Apply to affected area once 30 g 3  . LORazepam (ATIVAN) 0.5 MG tablet Take 1 tablet (0.5 mg total) by mouth every 6 (six) hours as needed (Nausea or vomiting). 30 tablet 0  . metoprolol succinate (TOPROL-XL) 25 MG 24 hr tablet TAKE 1 TABLET EVERY DAY 90 tablet 0  . Omega-3 Fatty Acids (FISH OIL) 1000 MG CAPS Take 1 capsule (1,000 mg total) by mouth daily.    . ondansetron (ZOFRAN) 8 MG tablet Take 1 tablet (8 mg total) by mouth 2 (two) times daily as needed for refractory nausea / vomiting. Start on day 3  after chemotherapy. 30 tablet 1  . prochlorperazine (COMPAZINE) 10 MG tablet Take 1 tablet (10 mg total) by mouth every 6 (six) hours as needed (Nausea or vomiting). 30 tablet 1  . simvastatin (ZOCOR) 40 MG tablet TAKE 1 TABLET AT BEDTIME 90 tablet 0  . sodium fluoride (PREVIDENT 5000 PLUS) 1.1 % CREA dental cream Apply cream to tooth brush. Brush teeth for 2 minutes. Spit out excess. DO NOT rinse afterwards. Repeat nightly. 1 Tube prn  . traMADol (ULTRAM) 50 MG tablet Take 1 tablet (50 mg total) by mouth every 8 (eight) hours as needed. Mail to the patient please. Patient has [place] of $700.00. 60 tablet 1  . VITAMIN E PO Take 1 capsule by mouth daily.     No current facility-administered medications for this visit.    Facility-Administered Medications Ordered in Other Visits  Medication Dose Route Frequency Provider Last Rate Last Dose  . magnesium sulfate 4 g in sodium chloride 0.9 %  1,000 mL injection   Intravenous Once Tish Men, MD        PHYSICAL EXAMINATION: ECOG PERFORMANCE STATUS: 2 - Symptomatic, <50% confined to bed  Today's Vitals   01/30/19 1200 01/30/19 1201  BP: 109/83   Pulse: 99   Resp: 18   Temp: 99.1 F (37.3 C)   TempSrc: Oral   SpO2: 98%   Weight: 179 lb 9.6 oz (81.5 kg)   Height: 5\' 5"  (1.651 m)   PainSc:  0-No pain   Body mass index is 29.89 kg/m.  Filed Weights   01/30/19 1200  Weight: 179 lb 9.6 oz (81.5 kg)    GENERAL: alert, no distress, disheveled sitting in a wheelchair  SKIN: a few areas of small ecchymoses over the upper extremities  EYES: conjunctiva are pink and non-injected, sclera clear OROPHARYNX: thick oral secretions, no definite mucositis Gloria Murray NECK: supple, non-tender LYMPH:  Right cervical lymphadenopathy improving but still persistent, ~2x2cm  LUNGS: clear to auscultation with normal breathing effort HEART: regular rate & rhythm and no murmurs and no lower extremity edema ABDOMEN: soft, non-tender, non-distended, normal  bowel sounds Musculoskeletal: no cyanosis of digits and no clubbing  PSYCH: alert & oriented x 3, slowed speech  LABORATORY DATA:  I have reviewed the data as listed    Component Value Date/Time   NA 139 01/23/2019 1314   K 3.3 (L) 01/23/2019 1314   CL 96 (L) 01/23/2019 1314   CO2 28 01/23/2019 1314   GLUCOSE 81 01/23/2019 1314   BUN 11 01/23/2019 1314   CREATININE 0.77 01/23/2019 1314   CREATININE 0.89 10/06/2017 1504   CALCIUM 9.8 01/23/2019 1314   PROT 7.2 11/21/2018 1251   ALBUMIN 4.1 11/21/2018 1251   AST 15 11/21/2018 1251   ALT 17 11/21/2018 1251   ALKPHOS 67 11/21/2018 1251   BILITOT 0.4 11/21/2018 1251   GFRNONAA >60 01/23/2019 1314   GFRAA >60 01/23/2019 1314    No results found for: SPEP, UPEP  Lab Results  Component Value Date   WBC 0.7 (LL) 01/30/2019   NEUTROABS 0.4 (LL) 01/30/2019   HGB 12.2 01/30/2019   HCT 35.5 (L) 01/30/2019   MCV 92.0 01/30/2019   PLT 232 01/30/2019      Chemistry      Component Value Date/Time   NA 139 01/23/2019 1314   K 3.3 (L) 01/23/2019 1314   CL 96 (L) 01/23/2019 1314   CO2 28 01/23/2019 1314   BUN 11 01/23/2019 1314   CREATININE 0.77 01/23/2019 1314   CREATININE 0.89 10/06/2017 1504      Component Value Date/Time   CALCIUM 9.8 01/23/2019 1314   ALKPHOS 67 11/21/2018 1251   AST 15 11/21/2018 1251   ALT 17 11/21/2018 1251   BILITOT 0.4 11/21/2018 1251

## 2019-01-30 ENCOUNTER — Other Ambulatory Visit: Payer: Self-pay

## 2019-01-30 ENCOUNTER — Encounter: Payer: Self-pay | Admitting: *Deleted

## 2019-01-30 ENCOUNTER — Encounter: Payer: Self-pay | Admitting: Radiation Oncology

## 2019-01-30 ENCOUNTER — Encounter: Payer: Self-pay | Admitting: Hematology

## 2019-01-30 ENCOUNTER — Inpatient Hospital Stay: Payer: Medicare HMO

## 2019-01-30 ENCOUNTER — Inpatient Hospital Stay: Payer: Medicare HMO | Attending: Hematology

## 2019-01-30 ENCOUNTER — Inpatient Hospital Stay (HOSPITAL_BASED_OUTPATIENT_CLINIC_OR_DEPARTMENT_OTHER): Payer: Medicare HMO | Admitting: Hematology

## 2019-01-30 ENCOUNTER — Ambulatory Visit: Payer: Medicare HMO | Admitting: Nutrition

## 2019-01-30 ENCOUNTER — Ambulatory Visit
Admission: RE | Admit: 2019-01-30 | Discharge: 2019-01-30 | Disposition: A | Discharge status: Home / Self Care | Payer: Medicare HMO | Source: Ambulatory Visit | Attending: Radiation Oncology | Admitting: Radiation Oncology

## 2019-01-30 VITALS — BP 109/83 | HR 99 | Temp 99.1°F | Resp 18 | Ht 65.0 in | Wt 179.6 lb

## 2019-01-30 VITALS — BP 115/74 | HR 94 | Resp 18

## 2019-01-30 DIAGNOSIS — Z95828 Presence of other vascular implants and grafts: Secondary | ICD-10-CM

## 2019-01-30 DIAGNOSIS — B370 Candidal stomatitis: Secondary | ICD-10-CM | POA: Diagnosis not present

## 2019-01-30 DIAGNOSIS — C321 Malignant neoplasm of supraglottis: Secondary | ICD-10-CM

## 2019-01-30 DIAGNOSIS — Z79899 Other long term (current) drug therapy: Secondary | ICD-10-CM

## 2019-01-30 DIAGNOSIS — E890 Postprocedural hypothyroidism: Secondary | ICD-10-CM | POA: Insufficient documentation

## 2019-01-30 DIAGNOSIS — I700 Atherosclerosis of aorta: Secondary | ICD-10-CM

## 2019-01-30 DIAGNOSIS — Z79890 Hormone replacement therapy (postmenopausal): Secondary | ICD-10-CM | POA: Insufficient documentation

## 2019-01-30 DIAGNOSIS — Z7982 Long term (current) use of aspirin: Secondary | ICD-10-CM

## 2019-01-30 DIAGNOSIS — D72819 Decreased white blood cell count, unspecified: Secondary | ICD-10-CM

## 2019-01-30 DIAGNOSIS — K118 Other diseases of salivary glands: Secondary | ICD-10-CM | POA: Diagnosis not present

## 2019-01-30 DIAGNOSIS — R599 Enlarged lymph nodes, unspecified: Secondary | ICD-10-CM

## 2019-01-30 DIAGNOSIS — Y842 Radiological procedure and radiotherapy as the cause of abnormal reaction of the patient, or of later complication, without mention of misadventure at the time of the procedure: Secondary | ICD-10-CM | POA: Diagnosis not present

## 2019-01-30 DIAGNOSIS — D709 Neutropenia, unspecified: Secondary | ICD-10-CM | POA: Diagnosis not present

## 2019-01-30 DIAGNOSIS — D701 Agranulocytosis secondary to cancer chemotherapy: Secondary | ICD-10-CM

## 2019-01-30 DIAGNOSIS — Z7902 Long term (current) use of antithrombotics/antiplatelets: Secondary | ICD-10-CM | POA: Insufficient documentation

## 2019-01-30 DIAGNOSIS — E876 Hypokalemia: Secondary | ICD-10-CM

## 2019-01-30 DIAGNOSIS — T451X5A Adverse effect of antineoplastic and immunosuppressive drugs, initial encounter: Secondary | ICD-10-CM | POA: Insufficient documentation

## 2019-01-30 DIAGNOSIS — Z951 Presence of aortocoronary bypass graft: Secondary | ICD-10-CM | POA: Insufficient documentation

## 2019-01-30 DIAGNOSIS — R110 Nausea: Secondary | ICD-10-CM

## 2019-01-30 DIAGNOSIS — I10 Essential (primary) hypertension: Secondary | ICD-10-CM | POA: Insufficient documentation

## 2019-01-30 DIAGNOSIS — Z510 Encounter for antineoplastic radiation therapy: Secondary | ICD-10-CM | POA: Insufficient documentation

## 2019-01-30 DIAGNOSIS — R112 Nausea with vomiting, unspecified: Secondary | ICD-10-CM

## 2019-01-30 DIAGNOSIS — E46 Unspecified protein-calorie malnutrition: Secondary | ICD-10-CM | POA: Diagnosis not present

## 2019-01-30 DIAGNOSIS — E785 Hyperlipidemia, unspecified: Secondary | ICD-10-CM | POA: Insufficient documentation

## 2019-01-30 DIAGNOSIS — Z87891 Personal history of nicotine dependence: Secondary | ICD-10-CM | POA: Insufficient documentation

## 2019-01-30 LAB — CBC WITH DIFFERENTIAL (CANCER CENTER ONLY)
Abs Immature Granulocytes: 0.01 10*3/uL (ref 0.00–0.07)
Basophils Absolute: 0 10*3/uL (ref 0.0–0.1)
Basophils Relative: 2 %
Eosinophils Absolute: 0 10*3/uL (ref 0.0–0.5)
Eosinophils Relative: 0 %
HCT: 35.5 % — ABNORMAL LOW (ref 36.0–46.0)
Hemoglobin: 12.2 g/dL (ref 12.0–15.0)
Immature Granulocytes: 2 %
Lymphocytes Relative: 21 %
Lymphs Abs: 0.1 10*3/uL — ABNORMAL LOW (ref 0.7–4.0)
MCH: 31.6 pg (ref 26.0–34.0)
MCHC: 34.4 g/dL (ref 30.0–36.0)
MCV: 92 fL (ref 80.0–100.0)
Monocytes Absolute: 0.1 10*3/uL (ref 0.1–1.0)
Monocytes Relative: 20 %
Neutro Abs: 0.4 10*3/uL — CL (ref 1.7–7.7)
Neutrophils Relative %: 55 %
Platelet Count: 232 10*3/uL (ref 150–400)
RBC: 3.86 MIL/uL — ABNORMAL LOW (ref 3.87–5.11)
RDW: 15.6 % — ABNORMAL HIGH (ref 11.5–15.5)
Smear Review: 1
WBC Count: 0.7 10*3/uL — CL (ref 4.0–10.5)
nRBC: 0 % (ref 0.0–0.2)

## 2019-01-30 LAB — BASIC METABOLIC PANEL - CANCER CENTER ONLY
Anion gap: 17 — ABNORMAL HIGH (ref 5–15)
BUN: 17 mg/dL (ref 8–23)
CO2: 33 mmol/L — ABNORMAL HIGH (ref 22–32)
Calcium: 10.3 mg/dL (ref 8.9–10.3)
Chloride: 89 mmol/L — ABNORMAL LOW (ref 98–111)
Creatinine: 0.79 mg/dL (ref 0.44–1.00)
GFR, Est AFR Am: >60 mL/min (ref >60)
GFR, Estimated: >60 mL/min (ref >60)
Glucose, Bld: 108 mg/dL — ABNORMAL HIGH (ref 70–99)
Potassium: 2.9 mmol/L — CL (ref 3.5–5.1)
Sodium: 139 mmol/L (ref 135–145)

## 2019-01-30 LAB — MAGNESIUM: Magnesium: 1.4 mg/dL — CL (ref 1.7–2.4)

## 2019-01-30 MED ORDER — SODIUM CHLORIDE 0.9% FLUSH
10.0000 mL | Freq: Once | INTRAVENOUS | Status: AC
  Administered 2019-01-30: 10 mL
  Filled 2019-01-30: qty 10

## 2019-01-30 MED ORDER — HEPARIN SOD (PORK) LOCK FLUSH 100 UNIT/ML IV SOLN
500.0000 [IU] | Freq: Once | INTRAVENOUS | Status: AC
  Administered 2019-01-30: 18:00:00 500 [IU]
  Filled 2019-01-30: qty 5

## 2019-01-30 MED ORDER — POTASSIUM CHLORIDE 10 MEQ/100ML IV SOLN
10.0000 meq | INTRAVENOUS | Status: AC
  Administered 2019-01-30 (×2): 10 meq via INTRAVENOUS
  Filled 2019-01-30: qty 100

## 2019-01-30 MED ORDER — POTASSIUM CHLORIDE CRYS ER 20 MEQ PO TBCR: 40.0000 meq | EXTENDED_RELEASE_TABLET | Freq: Once | ORAL | Status: DC

## 2019-01-30 MED ORDER — POTASSIUM CHLORIDE 20 MEQ/15ML (10%) PO SOLN
40.0000 meq | Freq: Once | ORAL | Status: DC
  Filled 2019-01-30: qty 30

## 2019-01-30 MED ORDER — POTASSIUM CHLORIDE CRYS ER 20 MEQ PO TBCR
EXTENDED_RELEASE_TABLET | ORAL | Status: AC
  Filled 2019-01-30: qty 2

## 2019-01-30 MED ORDER — SODIUM CHLORIDE 0.9% FLUSH
10.0000 mL | Freq: Once | INTRAVENOUS | Status: AC
  Administered 2019-01-30: 18:00:00 10 mL
  Filled 2019-01-30: qty 10

## 2019-01-30 MED ORDER — POTASSIUM CHLORIDE 20 MEQ/15ML (10%) PO SOLN: 20.0000 meq | Freq: Every day | ORAL | 2 refills | Status: DC

## 2019-01-30 MED ORDER — SODIUM CHLORIDE 0.9 % IV SOLN
Freq: Once | INTRAVENOUS | Status: AC
  Administered 2019-01-30: 13:00:00 via INTRAVENOUS
  Filled 2019-01-30: qty 8

## 2019-01-30 MED FILL — POTASSIUM CHLORIDE 20 MEQ/1: 20 MEQ/15ML | 31 days supply | Qty: 473 | Fill #0

## 2019-01-30 NOTE — Progress Notes (Signed)
Nutrition follow-up completed with patient receiving treatment for Larynx cancer. Weight decreased 179.6 pounds July 1 decreased from 187.7 pounds June 17. Patient does not have a feeding tube secondary to her refusal. She has been drinking some Ensure Enlive and prefers chocolate and strawberry flavors. She reports she has thick mucus which is causing her to have nausea and spitting up. She is drinking water and eating cream soup.  Nutrition diagnosis: Unintended weight loss continues.  Intervention: Stressed importance of patient increasing calories and protein in meals and snacks based on tolerance. Recommended oral nutrition supplements 3 times daily between meals. Provided samples and coupons. Encourage patient continue to rinse her mouth out with baking soda and salt water especially before she tries to eat. Encouraged other soft smooth foods to increase calories.  Monitoring, evaluation, goals: Patient will work to increase calories and protein to minimize weight loss.  Next visit: I encourage patient to contact me when she needed more supplements.

## 2019-01-30 NOTE — Patient Instructions (Signed)
Coronavirus (COVID-19) Are you at risk?  Are you at risk for the Coronavirus (COVID-19)?  To be considered HIGH RISK for Coronavirus (COVID-19), you have to meet the following criteria:  . Traveled to [place], [place], [place], [place] or [place]; or in the [place] to [place], [place], [place], or [place]; and have fever, cough, and shortness of breath within the last 2 weeks of travel OR . Been in close contact with a person diagnosed with COVID-19 within the last 2 weeks and have fever, cough, and shortness of breath . IF YOU DO NOT MEET THESE CRITERIA, YOU ARE CONSIDERED LOW RISK FOR COVID-19.  What to do if you are HIGH RISK for COVID-19?  . If you are having a medical emergency, call 911. . Seek medical care right away. Before you go to a doctor's office, urgent care or emergency department, call ahead and tell them about your recent travel, contact with someone diagnosed with COVID-19, and your symptoms. You should receive instructions from your physician's office regarding next steps of care.  . When you arrive at healthcare provider, tell the healthcare staff immediately you have returned from visiting [place], [place], [place], [place] or [place]; or traveled in the [place] to [place], [place], [place], or [place]; in the last two weeks or you have been in close contact with a person diagnosed with COVID-19 in the last 2 weeks.   . Tell the health care staff about your symptoms: fever, cough and shortness of breath. . After you have been seen by a medical provider, you will be either: o Tested for (COVID-19) and discharged home on quarantine except to seek medical care if symptoms worsen, and asked to  - Stay home and avoid contact with others until you get your results (4-5 days)  - Avoid travel on public transportation if possible (such as bus, train, or airplane) or o Sent to the Emergency Department by EMS for evaluation, COVID-19 testing, and possible  admission depending on your condition and test results.  What to do if you are LOW RISK for COVID-19?  Reduce your risk of any infection by using the same precautions used for avoiding the common cold or flu:  . Wash your hands often with soap and warm water for at least 20 seconds.  If soap and water are not readily available, use an alcohol-based hand sanitizer with at least 60% alcohol.  . If coughing or sneezing, cover your mouth and nose by coughing or sneezing into the elbow areas of your shirt or coat, into a tissue or into your sleeve (not your hands). . Avoid shaking hands with others and consider head nods or verbal greetings only. . Avoid touching your eyes, nose, or mouth with unwashed hands.  . Avoid close contact with people who are sick. . Avoid places or events with large numbers of people in one location, like concerts or sporting events. . Carefully consider travel plans you have or are making. . If you are planning any travel outside or inside the [place], visit the CDC's Travelers' Health webpage for the latest health notices. . If you have some symptoms but not all symptoms, continue to monitor at home and seek medical attention if your symptoms worsen. . If you are having a medical emergency, call 911.   ADDITIONAL HEALTHCARE OPTIONS FOR PATIENTS  [facility] / e-Visit: [email or web address]         [facility]: [phone]  [facility]: [phone]                   [facility]: [phone]    Hypomagnesemia Hypomagnesemia is a condition in which the level of magnesium in the blood is low. Magnesium is a mineral that is found in many foods. It is used in many different processes in the body. Hypomagnesemia can affect every organ in the body. In severe cases, it can cause life-threatening problems. What are the causes? This condition may be caused by:  Not getting enough  magnesium in your diet.  Malnutrition.  Problems with absorbing magnesium from the intestines.  Dehydration.  Alcohol abuse.  Vomiting.  Severe or chronic diarrhea.  Some medicines, including medicines that make you urinate more (diuretics).  Certain diseases, such as kidney disease, diabetes, celiac disease, and overactive thyroid. What are the signs or symptoms? Symptoms of this condition include:  Loss of appetite.  Nausea and vomiting.  Involuntary shaking or trembling of a body part (tremor).  Muscle weakness.  Tingling in the arms and legs.  Sudden tightening of muscles (muscle spasms).  Confusion.  Psychiatric issues, such as depression, irritability, or psychosis.  A feeling of fluttering of the heart.  Seizures. These symptoms are more severe if magnesium levels drop suddenly. How is this diagnosed? This condition may be diagnosed based on:  Your symptoms and medical history.  A physical exam.  Blood and urine tests. How is this treated? Treatment depends on the cause and the severity of the condition. It may be treated with:  A magnesium supplement. This can be taken in pill form. If the condition is severe, magnesium is usually given through an IV.  Changes to your diet. You may be directed to eat foods that have a lot of magnesium, such as green leafy vegetables, peas, beans, and nuts.  Stopping any intake of alcohol. Follow these instructions at home:      Make sure that your diet includes foods with magnesium. Foods that have a lot of magnesium in them include: ? Green leafy vegetables, such as spinach and broccoli. ? Beans and peas. ? Nuts and seeds, such as almonds and sunflower seeds. ? Whole grains, such as whole grain bread and fortified cereals.  Take magnesium supplements if your health care provider tells you to do that. Take them as directed.  Take over-the-counter and prescription medicines only as told by your health care  provider.  Have your magnesium levels monitored as told by your health care provider.  When you are active, drink fluids that contain electrolytes.  Avoid drinking alcohol.  Keep all follow-up visits as told by your health care provider. This is important. Contact a health care provider if:  You get worse instead of better.  Your symptoms return. Get help right away if you:  Develop severe muscle weakness.  Have trouble breathing.  Feel that your heart is racing. Summary  Hypomagnesemia is a condition in which the level of magnesium in the blood is low.  Hypomagnesemia can affect every organ in the body.  Treatment may include eating more foods that contain magnesium, taking magnesium supplements, and not drinking alcohol.  Have your magnesium levels monitored as told by your health care provider. This information is not intended to replace advice given to you by your health care provider. Make sure you discuss any questions you have with your health care provider. Document Released: 04/13/2005 Document Revised: 06/30/2017 Document Reviewed: 06/19/2017 Elsevier Patient Education  2020 Elsevier Inc.  

## 2019-01-30 NOTE — Progress Notes (Signed)
Received call from lab with critical labs-WBC 0.7 and ANC of 0.4  Dr. Maylon Peppers made aware.

## 2019-01-30 NOTE — Progress Notes (Signed)
Patient is to receive K 20 mEq and mg 4 gm in 1L normal saline tomorrow, 7/2, per Dr. Maylon Peppers. Order has been added to supportive care plan.   Demetrius Charity, PharmD, [facility] Pharmacist Pharmacy Phone: [phone] 01/30/2019

## 2019-01-31 ENCOUNTER — Telehealth: Payer: Self-pay | Admitting: Hematology

## 2019-01-31 ENCOUNTER — Other Ambulatory Visit: Payer: Self-pay | Admitting: Hematology

## 2019-01-31 ENCOUNTER — Inpatient Hospital Stay: Payer: Medicare HMO

## 2019-01-31 ENCOUNTER — Other Ambulatory Visit: Payer: Self-pay

## 2019-01-31 VITALS — BP 112/73 | HR 91 | Temp 98.5°F | Resp 18

## 2019-01-31 DIAGNOSIS — E46 Unspecified protein-calorie malnutrition: Secondary | ICD-10-CM | POA: Diagnosis not present

## 2019-01-31 DIAGNOSIS — D709 Neutropenia, unspecified: Secondary | ICD-10-CM | POA: Diagnosis not present

## 2019-01-31 DIAGNOSIS — Z95828 Presence of other vascular implants and grafts: Secondary | ICD-10-CM

## 2019-01-31 DIAGNOSIS — K118 Other diseases of salivary glands: Secondary | ICD-10-CM | POA: Diagnosis not present

## 2019-01-31 DIAGNOSIS — C321 Malignant neoplasm of supraglottis: Secondary | ICD-10-CM | POA: Diagnosis not present

## 2019-01-31 DIAGNOSIS — E876 Hypokalemia: Secondary | ICD-10-CM | POA: Diagnosis not present

## 2019-01-31 DIAGNOSIS — B370 Candidal stomatitis: Secondary | ICD-10-CM | POA: Diagnosis not present

## 2019-01-31 DIAGNOSIS — D72819 Decreased white blood cell count, unspecified: Secondary | ICD-10-CM | POA: Diagnosis not present

## 2019-01-31 DIAGNOSIS — T451X5A Adverse effect of antineoplastic and immunosuppressive drugs, initial encounter: Secondary | ICD-10-CM | POA: Diagnosis not present

## 2019-01-31 MED ORDER — SODIUM CHLORIDE 0.9 % IV SOLN
Freq: Once | INTRAVENOUS | Status: AC
  Administered 2019-01-31: 09:00:00 via INTRAVENOUS
  Filled 2019-01-31: qty 10

## 2019-01-31 MED ORDER — HEPARIN SOD (PORK) LOCK FLUSH 100 UNIT/ML IV SOLN
500.0000 [IU] | Freq: Once | INTRAVENOUS | Status: AC | PRN
  Administered 2019-01-31: 500 [IU]
  Filled 2019-01-31: qty 5

## 2019-01-31 MED ORDER — SODIUM CHLORIDE 0.9% FLUSH
10.0000 mL | Freq: Once | INTRAVENOUS | Status: AC | PRN
  Administered 2019-01-31: 10 mL
  Filled 2019-01-31: qty 10

## 2019-01-31 MED ORDER — MAGNESIUM SULFATE 4 GM/100ML IV SOLN: 4.0000 g | Freq: Once | INTRAVENOUS | Status: DC

## 2019-01-31 NOTE — Progress Notes (Signed)
Oncology Nurse Navigator Documentation  Met with Gloria Murray during Est Pt appt with Dr. Maylon Peppers to check on her well-being and to assess needs. She acknowledged final RT today, I congratulated her on accomplishment. She voiced understanding she will receive no more chemotherapy d/t low blood counts, will be scheduled for supportive IVF. She reported minimal daily oral intake, eg 2 cans of creamed soups, 2-3 16oz bottles of water.  Dr. Maylon Peppers indicated 7 lb wt loss since last week. I informed RD Dory Peru of chemotherapy cancellation, plan for IVF instead, to facilitate her scheduled encounter and provision of nutritional supplement. Picked up newly ordered liquid potassium from [facility] and delivered to [name].  Gayleen Orem, RN, BSN Head & Neck Oncology Nurse [place] [facility] [phone]

## 2019-01-31 NOTE — Telephone Encounter (Signed)
Scheduled per los. Gave calendar  

## 2019-01-31 NOTE — Addendum Note (Signed)
Addended by: Neysa Hotter on: 01/31/2019 03:20 PM   Modules accepted: Orders

## 2019-01-31 NOTE — Addendum Note (Signed)
Addended by: Neysa Hotter on: 01/31/2019 03:35 PM   Modules accepted: Orders

## 2019-01-31 NOTE — Patient Instructions (Signed)
[facility] Discharge Instructions for Patients R BELOW ARE SYMPTOMS THAT SHOULD BE REPORTED IMMEDIATELY:  *FEVER GREATER THAN 100.5 F  *CHILLS WITH OR WITHOUT FEVER  NAUSEA AND VOMITING THAT IS NOT CONTROLLED WITH YOUR NAUSEA MEDICATION  *UNUSUAL SHORTNESS OF BREATH  *UNUSUAL BRUISING OR BLEEDING  TENDERNESS IN MOUTH AND THROAT WITH OR WITHOUT PRESENCE OF ULCERS  *URINARY PROBLEMS  *BOWEL PROBLEMS  UNUSUAL RASH Items with * indicate a potential emergency and should be followed up as soon as possible.  Feel free to call the clinic should you have any questions or concerns. The clinic phone number is [phone].  Please show the [facility] at check-in to the Emergency Department and triage nurse.     Rehydration, Adult Rehydration is the replacement of body fluids and salts and minerals (electrolytes) that are lost during dehydration. Dehydration is when there is not enough fluid or water in the body. This happens when you lose more fluids than you take in. Common causes of dehydration include:  Vomiting.  Diarrhea.  Excessive sweating, such as from heat exposure or exercise.  Taking medicines that cause the body to lose excess fluid (diuretics).  Impaired kidney function.  Not drinking enough fluid.  Certain illnesses or infections.  Certain poorly controlled long-term (chronic) illnesses, such as diabetes, heart disease, and kidney disease.  Symptoms of mild dehydration may include thirst, dry lips and mouth, dry skin, and dizziness. Symptoms of severe dehydration may include increased heart rate, confusion, fainting, and not urinating. You can rehydrate by drinking certain fluids or getting fluids through an IV tube, as told by your health care provider. What are the risks? Generally, rehydration is safe. However, one problem that can happen is taking in too much fluid (overhydration). This is rare. If overhydration happens, it can cause  an electrolyte imbalance, kidney failure, or a decrease in salt (sodium) levels in the body. How to rehydrate Follow instructions from your health care provider for rehydration. The kind of fluid you should drink and the amount you should drink depend on your condition.  If directed by your health care provider, drink an oral rehydration solution (ORS). This is a drink designed to treat dehydration that is found in pharmacies and retail stores. ? Make an ORS by following instructions on the package. ? Start by drinking small amounts, about  cup (120 mL) every 5-10 minutes. ? Slowly increase how much you drink until you have taken the amount recommended by your health care provider.  Drink enough clear fluids to keep your urine clear or pale yellow. If you were instructed to drink an ORS, finish the ORS first, then start slowly drinking other clear fluids. Drink fluids such as: ? Water. Do not drink only water. Doing that can lead to having too little sodium in your body (hyponatremia). ? Ice chips. ? Fruit juice that you have added water to (diluted juice). ? Low-calorie sports drinks.  If you are severely dehydrated, your health care provider may recommend that you receive fluids through an IV tube in the hospital.  Do not take sodium tablets. Doing that can lead to the condition of having too much sodium in your body (hypernatremia). Eating while you rehydrate Follow instructions from your health care provider about what to eat while you rehydrate. Your health care provider may recommend that you slowly begin eating regular foods in small amounts.  Eat foods that contain a healthy balance of electrolytes, such as bananas, oranges, potatoes, tomatoes,  and spinach.  Avoid foods that are greasy or contain a lot of fat or sugar.  In some cases, you may get nutrition through a feeding tube that is passed through your nose and into your stomach (nasogastric tube, or NG tube). This may be done  if you have uncontrolled vomiting or diarrhea. Beverages to avoid Certain beverages may make dehydration worse. While you rehydrate, avoid:  Alcohol.  Caffeine.  Drinks that contain a lot of sugar. These include: ? High-calorie sports drinks. ? Fruit juice that is not diluted. ? Soda.  Check nutrition labels to see how much sugar or caffeine a beverage contains. Signs of dehydration recovery You may be recovering from dehydration if:  You are urinating more often than before you started rehydrating.  Your urine is clear or pale yellow.  Your energy level improves.  You vomit less frequently.  You have diarrhea less frequently.  Your appetite improves or returns to normal.  You feel less dizzy or less light-headed.  Your skin tone and color start to look more normal. Contact a health care provider if:  You continue to have symptoms of mild dehydration, such as: ? Thirst. ? Dry lips. ? Slightly dry mouth. ? Dry, warm skin. ? Dizziness.  You continue to vomit or have diarrhea. Get help right away if:  You have symptoms of dehydration that get worse.  You feel: ? Confused. ? Weak. ? Like you are going to faint.  You have not urinated in 6-8 hours.  You have very dark urine.  You have trouble breathing.  Your heart rate while sitting still is over 100 beats a minute.  You cannot drink fluids without vomiting.  You have vomiting or diarrhea that: ? Gets worse. ? Does not go away.  You have a fever. This information is not intended to replace advice given to you by your health care provider. Make sure you discuss any questions you have with your health care provider. Document Released: 10/10/2011 Document Revised: 06/30/2017 Document Reviewed: 09/11/2015 Elsevier Patient Education  2020 [name].

## 2019-02-01 ENCOUNTER — Telehealth: Payer: Self-pay | Admitting: *Deleted

## 2019-02-01 ENCOUNTER — Inpatient Hospital Stay: Payer: Medicare HMO

## 2019-02-01 NOTE — Telephone Encounter (Signed)
Pt. called and stated she could not make her appointment today due to transportation. Md notified.

## 2019-02-04 ENCOUNTER — Inpatient Hospital Stay: Payer: Medicare HMO

## 2019-02-04 ENCOUNTER — Other Ambulatory Visit: Payer: Self-pay

## 2019-02-04 VITALS — BP 92/62 | HR 126 | Temp 98.3°F

## 2019-02-04 DIAGNOSIS — C321 Malignant neoplasm of supraglottis: Secondary | ICD-10-CM | POA: Diagnosis not present

## 2019-02-04 DIAGNOSIS — K118 Other diseases of salivary glands: Secondary | ICD-10-CM | POA: Diagnosis not present

## 2019-02-04 DIAGNOSIS — D709 Neutropenia, unspecified: Secondary | ICD-10-CM | POA: Diagnosis not present

## 2019-02-04 DIAGNOSIS — Z95828 Presence of other vascular implants and grafts: Secondary | ICD-10-CM

## 2019-02-04 DIAGNOSIS — D72819 Decreased white blood cell count, unspecified: Secondary | ICD-10-CM | POA: Diagnosis not present

## 2019-02-04 DIAGNOSIS — E876 Hypokalemia: Secondary | ICD-10-CM | POA: Diagnosis not present

## 2019-02-04 DIAGNOSIS — T451X5A Adverse effect of antineoplastic and immunosuppressive drugs, initial encounter: Secondary | ICD-10-CM | POA: Diagnosis not present

## 2019-02-04 DIAGNOSIS — E46 Unspecified protein-calorie malnutrition: Secondary | ICD-10-CM | POA: Diagnosis not present

## 2019-02-04 DIAGNOSIS — B370 Candidal stomatitis: Secondary | ICD-10-CM | POA: Diagnosis not present

## 2019-02-04 MED ORDER — MAGNESIUM SULFATE 4 GM/100ML IV SOLN
4.0000 g | Freq: Once | INTRAVENOUS | Status: AC
  Administered 2019-02-04: 4 g via INTRAVENOUS
  Filled 2019-02-04: qty 100

## 2019-02-04 MED ORDER — SODIUM CHLORIDE 0.9% FLUSH
10.0000 mL | Freq: Once | INTRAVENOUS | Status: DC | PRN
  Filled 2019-02-04: qty 10

## 2019-02-04 MED ORDER — HEPARIN SOD (PORK) LOCK FLUSH 100 UNIT/ML IV SOLN
500.0000 [IU] | Freq: Once | INTRAVENOUS | Status: DC | PRN
  Filled 2019-02-04: qty 5

## 2019-02-04 MED ORDER — POTASSIUM CHLORIDE IN NACL 20-0.9 MEQ/L-% IV SOLN
Freq: Once | INTRAVENOUS | Status: AC
  Administered 2019-02-04: 12:00:00 via INTRAVENOUS
  Filled 2019-02-04: qty 1000

## 2019-02-04 NOTE — Progress Notes (Signed)
Per Dr. Lorin Mercy to receive IV magnesium + potassium since pt was not able to come to appt last Friday. Orders released from signed and held. Pt updated on plan and agreeable

## 2019-02-04 NOTE — Patient Instructions (Signed)
[facility] Discharge Instructions for Patients R BELOW ARE SYMPTOMS THAT SHOULD BE REPORTED IMMEDIATELY:  *FEVER GREATER THAN 100.5 F  *CHILLS WITH OR WITHOUT FEVER  NAUSEA AND VOMITING THAT IS NOT CONTROLLED WITH YOUR NAUSEA MEDICATION  *UNUSUAL SHORTNESS OF BREATH  *UNUSUAL BRUISING OR BLEEDING  TENDERNESS IN MOUTH AND THROAT WITH OR WITHOUT PRESENCE OF ULCERS  *URINARY PROBLEMS  *BOWEL PROBLEMS  UNUSUAL RASH Items with * indicate a potential emergency and should be followed up as soon as possible.  Feel free to call the clinic should you have any questions or concerns. The clinic phone number is [phone].  Please show the [facility] at check-in to the Emergency Department and triage nurse.     Rehydration, Adult Rehydration is the replacement of body fluids and salts and minerals (electrolytes) that are lost during dehydration. Dehydration is when there is not enough fluid or water in the body. This happens when you lose more fluids than you take in. Common causes of dehydration include:  Vomiting.  Diarrhea.  Excessive sweating, such as from heat exposure or exercise.  Taking medicines that cause the body to lose excess fluid (diuretics).  Impaired kidney function.  Not drinking enough fluid.  Certain illnesses or infections.  Certain poorly controlled long-term (chronic) illnesses, such as diabetes, heart disease, and kidney disease.  Symptoms of mild dehydration may include thirst, dry lips and mouth, dry skin, and dizziness. Symptoms of severe dehydration may include increased heart rate, confusion, fainting, and not urinating. You can rehydrate by drinking certain fluids or getting fluids through an IV tube, as told by your health care provider. What are the risks? Generally, rehydration is safe. However, one problem that can happen is taking in too much fluid (overhydration). This is rare. If overhydration happens, it can cause  an electrolyte imbalance, kidney failure, or a decrease in salt (sodium) levels in the body. How to rehydrate Follow instructions from your health care provider for rehydration. The kind of fluid you should drink and the amount you should drink depend on your condition.  If directed by your health care provider, drink an oral rehydration solution (ORS). This is a drink designed to treat dehydration that is found in pharmacies and retail stores. ? Make an ORS by following instructions on the package. ? Start by drinking small amounts, about  cup (120 mL) every 5-10 minutes. ? Slowly increase how much you drink until you have taken the amount recommended by your health care provider.  Drink enough clear fluids to keep your urine clear or pale yellow. If you were instructed to drink an ORS, finish the ORS first, then start slowly drinking other clear fluids. Drink fluids such as: ? Water. Do not drink only water. Doing that can lead to having too little sodium in your body (hyponatremia). ? Ice chips. ? Fruit juice that you have added water to (diluted juice). ? Low-calorie sports drinks.  If you are severely dehydrated, your health care provider may recommend that you receive fluids through an IV tube in the hospital.  Do not take sodium tablets. Doing that can lead to the condition of having too much sodium in your body (hypernatremia). Eating while you rehydrate Follow instructions from your health care provider about what to eat while you rehydrate. Your health care provider may recommend that you slowly begin eating regular foods in small amounts.  Eat foods that contain a healthy balance of electrolytes, such as bananas, oranges, potatoes, tomatoes,  and spinach.  Avoid foods that are greasy or contain a lot of fat or sugar.  In some cases, you may get nutrition through a feeding tube that is passed through your nose and into your stomach (nasogastric tube, or NG tube). This may be done  if you have uncontrolled vomiting or diarrhea. Beverages to avoid Certain beverages may make dehydration worse. While you rehydrate, avoid:  Alcohol.  Caffeine.  Drinks that contain a lot of sugar. These include: ? High-calorie sports drinks. ? Fruit juice that is not diluted. ? Soda.  Check nutrition labels to see how much sugar or caffeine a beverage contains. Signs of dehydration recovery You may be recovering from dehydration if:  You are urinating more often than before you started rehydrating.  Your urine is clear or pale yellow.  Your energy level improves.  You vomit less frequently.  You have diarrhea less frequently.  Your appetite improves or returns to normal.  You feel less dizzy or less light-headed.  Your skin tone and color start to look more normal. Contact a health care provider if:  You continue to have symptoms of mild dehydration, such as: ? Thirst. ? Dry lips. ? Slightly dry mouth. ? Dry, warm skin. ? Dizziness.  You continue to vomit or have diarrhea. Get help right away if:  You have symptoms of dehydration that get worse.  You feel: ? Confused. ? Weak. ? Like you are going to faint.  You have not urinated in 6-8 hours.  You have very dark urine.  You have trouble breathing.  Your heart rate while sitting still is over 100 beats a minute.  You cannot drink fluids without vomiting.  You have vomiting or diarrhea that: ? Gets worse. ? Does not go away.  You have a fever. This information is not intended to replace advice given to you by your health care provider. Make sure you discuss any questions you have with your health care provider. Document Released: 10/10/2011 Document Revised: 06/30/2017 Document Reviewed: 09/11/2015 Elsevier Patient Education  2020 [name].

## 2019-02-05 ENCOUNTER — Inpatient Hospital Stay: Payer: Medicare HMO

## 2019-02-05 ENCOUNTER — Encounter: Payer: Self-pay | Admitting: *Deleted

## 2019-02-05 ENCOUNTER — Other Ambulatory Visit: Payer: Self-pay

## 2019-02-05 ENCOUNTER — Other Ambulatory Visit: Payer: Self-pay | Admitting: Hematology

## 2019-02-05 VITALS — BP 115/79 | HR 99 | Temp 99.1°F | Resp 17

## 2019-02-05 DIAGNOSIS — B370 Candidal stomatitis: Secondary | ICD-10-CM | POA: Diagnosis not present

## 2019-02-05 DIAGNOSIS — T451X5A Adverse effect of antineoplastic and immunosuppressive drugs, initial encounter: Secondary | ICD-10-CM

## 2019-02-05 DIAGNOSIS — E46 Unspecified protein-calorie malnutrition: Secondary | ICD-10-CM | POA: Diagnosis not present

## 2019-02-05 DIAGNOSIS — R112 Nausea with vomiting, unspecified: Secondary | ICD-10-CM

## 2019-02-05 DIAGNOSIS — Z95828 Presence of other vascular implants and grafts: Secondary | ICD-10-CM

## 2019-02-05 DIAGNOSIS — D72819 Decreased white blood cell count, unspecified: Secondary | ICD-10-CM | POA: Diagnosis not present

## 2019-02-05 DIAGNOSIS — D709 Neutropenia, unspecified: Secondary | ICD-10-CM | POA: Diagnosis not present

## 2019-02-05 DIAGNOSIS — C321 Malignant neoplasm of supraglottis: Secondary | ICD-10-CM

## 2019-02-05 DIAGNOSIS — E876 Hypokalemia: Secondary | ICD-10-CM | POA: Diagnosis not present

## 2019-02-05 DIAGNOSIS — K118 Other diseases of salivary glands: Secondary | ICD-10-CM | POA: Diagnosis not present

## 2019-02-05 MED ORDER — SODIUM CHLORIDE 0.9 % IV SOLN
INTRAVENOUS | Status: DC
  Administered 2019-02-05: 08:00:00 via INTRAVENOUS
  Filled 2019-02-05 (×2): qty 250

## 2019-02-05 MED ORDER — SODIUM CHLORIDE 0.9% FLUSH
10.0000 mL | Freq: Once | INTRAVENOUS | Status: AC | PRN
  Administered 2019-02-05: 10 mL
  Filled 2019-02-05: qty 10

## 2019-02-05 MED ORDER — HEPARIN SOD (PORK) LOCK FLUSH 100 UNIT/ML IV SOLN
500.0000 [IU] | Freq: Once | INTRAVENOUS | Status: AC | PRN
  Administered 2019-02-05: 500 [IU]
  Filled 2019-02-05: qty 5

## 2019-02-05 NOTE — Progress Notes (Signed)
[place] OFFICE PROGRESS NOTE  Patient Care Team: Jearld Fenton, NP as PCP - General (Internal Medicine) Irene Shipper, MD as Consulting Physician (Gastroenterology) Early, Arvilla Meres, MD as Consulting Physician (Vascular Surgery) Serafina Mitchell, MD as Consulting Physician (Vascular Surgery) Eppie Gibson, MD as Attending Physician (Radiation Oncology) Leota Sauers, RN as Oncology Nurse Navigator (Oncology) Karie Mainland, RD as Dietitian (Nutrition) Sharen Counter, CCC-SLP as Speech Language Pathologist (Speech Pathology) Kennith Center, LCSW as Social Worker  HEME/ONC OVERVIEW: 1. 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 R Level II LN 5 x 3.3cm; L Level II LN 5.4 x 2.7cm w/ suspicion for ECE) -10/2018: DL with bx by Dr. Redmond Baseman showed a mass involving the left aryepiglottic fold extending up to the epiglottic and BOT, bx showed invasive squamous cell carcinoma; PET showed FDG-avid supraglottic malignancy and bilateral bulky FDG-avid Level II and III cervical LN's, no mets  -Late 10/2018 - 01/2019: definitive chemoradiation with weekly carboplatin    2. 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 -S/p definitive chemoRT with weekly carboplatin x 5 dose; treatment complicated by prolonged cytopenias and poor tolerance  -Patient declined feeding tube placement due to lack of social support  -On exam, right cervical lymphadenopathy is somewhat smaller since starting treatment, but remains bulky and fixed -Therefore, we will consider obtaining CT neck and chest in ~6-8 weeks (ie late 03/2019) after completing  treatment to monitor for any persistent disease or progression  -PRN anti-emetics: Zofran, Compazine, Ativan and dexamethasone   Chemotherapy-associated leukopenia -Secondary to chemotherapy -WBC 1.5k with ANC 1100, improving but remains very low   -Patient denies any symptoms of infection -No indication for G-CSF unless patient develops any fever or other signs of infection  -We will monitor it for now    Chemotherapy-associated anemia -Secondary to chemotherapy -Hgb 11.5, slightly lower than last visit  -Patient denies any symptom of bleeding -We will monitor for now  Hypomagnesemia -Secondary to electrolyte wasting from chemotherapy -Mg 1.7 today, improving  -She has received two doses of IV Mg this week -We will administer 2g IV Mg today with fluid support  -Continue oral magnesium supplement    Hypokalemia -Secondary to electrolyte wasting from chemotherapy -K 3.1 today, improving  -She has received two doses of IV KCl this week -We will administer 26mEq IV KCl with fluid support today  -On PO 65mEq daily  Oral candidiasis -New; exam notable for white plaques on the tongue and a few smaller spots over the soft palate -I have prescribed fluconazole x 3 weeks, and encouraged the patient to maintain adequate oral hygiene   Thick secretions -Secondary to radiation damage to the salivary glands -Patient is only doing salt/soda rinses 3-4x/day -I encouraged the patient to increase the frequency of salt/soda rinses, which appears to be helping  -I will hold off prescribing scopolamine patch due to the patient's frailty and increased risk of side effects from antimuscarinic medications   Protein malnutrition -Secondary to chemoradiation -Weight down by 2 lbs over the past two week -No feeding tube due to lack of social support and patient preference  -She has been receiving daily IV fluid  support during the past week due to poor PO intake -We will plan to continue IV fluid  support through the reminder of this week and early next week -I encouraged the patient to maintain adequate PO intake as tolerated, and to adhere to nutritional recommendations   No orders of the defined types were placed in this encounter.  All questions were answered. The patient knows to call the clinic with any problems, questions or concerns. No barriers to learning was detected.  Return in 1 week for labs, port flush and clinic appt. IV fluid support for the reminder of the week and early next week.  Tish Men, MD 02/06/2019 9:22 AM  CHIEF COMPLAINT: "I still have lots of phlegm"  INTERVAL HISTORY: Gloria Murray returns to clinic for follow-up of squamous cell carcinoma of the supraglottic larynx s/p definitive chemoradiation.  Patient reports that she still has very thick, white secretions, for which she has been doing salt/soda rinses 3-4 times a day with some relief.  She is able to eat eat soup, sandwiches, pudding, and some foods.  She does not eat any crackers due to it causing pain with swallowing.  She has been trying to increase her water intake, and is drinking approximately 5 bottles of 16 ounce water per day.  She denies any fever, chill, night sweats, chest pain, dyspnea, cough, abdominal pain, nausea, vomiting, or diarrhea.  SUMMARY OF ONCOLOGIC HISTORY: Oncology History  Malignant neoplasm of supraglottis ([place])  10/26/2018 Imaging   CT neck w/ contrast: IMPRESSION: 1. Bulky, partially necrotic bilateral cervical lymphadenopathy most concerning for metastatic disease, less likely unusual infection. 2. Prominent soft tissue at the tongue base with asymmetric left aryepiglottic fold thickening and slight fullness of the left palatine tonsil. Direct visualization is recommended to assess for a primary mucosal malignancy. 3.  Aortic Atherosclerosis (ICD10-I70.0).   11/08/2018 Procedure   DL with biopsy   11/08/2018 Pathology Results   Accession: [id number] 9. Larynx, biopsy,  Left Supraglottis - INVASIVE SQUAMOUS CELL CARCINOMA. 2. Larynx, biopsy, Left Ary Epiglottic Fold - INVASIVE SQUAMOUS CELL CARCINOMA. 3. Tongue, biopsy, Left Vallecular Base - INVASIVE SQUAMOUS CELL CARCINOMA. 4. Larynx, biopsy, Right Vallecular Epiglottis - INVASIVE SQUAMOUS CELL CARCINOMA.   11/16/2018 Initial Diagnosis   Laryngeal cancer ([place])   11/23/2018 Imaging   PET: IMPRESSION: 1. Intensely hypermetabolic supraglottic mass predominantly involving the LEFT aryepiglottic fold but extending across midline anteriorly. Inferiorly hypermetabolic activity extends to the level of the larynx on the LEFT. 2. Bilateral bulky intensely hypermetabolic metastatic level II lymph nodes. Metastatic adenopathy extends with smaller nodes in the level III nodal stations bilaterally. 3. No evidence of thoracic metastasis. 4. Enlarged LEFT adrenal gland consistent with hyperplasia or lipid poor adenoma.   11/27/2018 Cancer Staging   Staging form: Larynx - Supraglottis, AJCC 8th Edition - Clinical stage from 11/27/2018: Stage IVB (cT2, cN3b, cM0) - Signed by Eppie Gibson, MD on 11/27/2018   12/13/2018 - 01/29/2019 Chemotherapy   The patient had palonosetron (ALOXI) injection 0.25 mg, 0.25 mg, Intravenous,  Once, 5 of 7 cycles Administration: 0.25 mg (12/13/2018), 0.25 mg (12/20/2018), 0.25 mg (12/27/2018), 0.25 mg (01/03/2019), 0.25 mg (01/10/2019) CARBOplatin (PARAPLATIN) 210 mg in sodium chloride 0.9 % 250 mL chemo infusion, 210 mg (100 % of original dose 213.8 mg), Intravenous,  Once, 5 of 7 cycles Dose modification:   (original dose 213.8 mg, Cycle 1) Administration: 210 mg (12/13/2018), 210 mg (12/20/2018), 210 mg (12/27/2018), 210 mg (01/03/2019), 210 mg (01/10/2019)  for chemotherapy treatment.  REVIEW OF SYSTEMS:   Constitutional: ( - ) fevers, ( - )  chills , ( - ) night sweats Eyes: ( - ) blurriness of vision, ( - ) double vision, ( - ) watery eyes Ears, nose, mouth, throat, and face: ( - )  mucositis, ( - ) sore throat Respiratory: ( - ) cough, ( - ) dyspnea, ( - ) wheezes Cardiovascular: ( - ) palpitation, ( - ) chest discomfort, ( - ) lower extremity swelling Gastrointestinal:  ( - ) nausea, ( - ) heartburn, ( - ) change in bowel habits Skin: ( - ) abnormal skin rashes Lymphatics: ( - ) new lymphadenopathy, ( - ) easy bruising Neurological: ( - ) numbness, ( - ) tingling, ( - ) new weaknesses Behavioral/Psych: ( - ) mood change, ( - ) new changes  All other systems were reviewed with the patient and are negative.  I have reviewed the past medical history, past surgical history, social history and family history with the patient and they are unchanged from previous note.  ALLERGIES:  is allergic to propoxyphene hcl and propoxyphene n-acetaminophen.  MEDICATIONS:  Current Outpatient Medications  Medication Sig Dispense Refill  . Ascorbic Acid (VITAMIN C PO) Take 1 tablet by mouth daily.    Marland Kitchen aspirin EC 81 MG tablet Take 81 mg by mouth daily.    Marland Kitchen CALCIUM PO Take 1 tablet by mouth daily.    . Cholecalciferol (VITAMIN D3 PO) Take 1 tablet by mouth daily.    . clopidogrel (PLAVIX) 75 MG tablet TAKE 1 TABLET (75 MG TOTAL) BY MOUTH DAILY. 90 tablet 3  . fexofenadine (ALLEGRA) 180 MG tablet Take 180 mg by mouth at bedtime.     . fluticasone (FLONASE) 50 MCG/ACT nasal spray Place 1 spray into both nostrils daily.     . Garlic 2952 MG CAPS Take 1 capsule by mouth 2 (two) times daily.      . hydrochlorothiazide (MICROZIDE) 12.5 MG capsule TAKE 1 CAPSULE EVERY DAY 90 capsule 0  . levothyroxine (SYNTHROID) 112 MCG tablet TAKE 1 TABLET EVERY DAY 90 tablet 0  . lidocaine (XYLOCAINE) 2 % solution Patient: Mix 1part 2% viscous lidocaine, 1part H20. Swish & swallow 75mL of diluted mixture, 52min before meals and at bedtime, up to QID 100 mL 5  . lidocaine-prilocaine (EMLA) cream Apply to affected area once 30 g 3  . LORazepam (ATIVAN) 0.5 MG tablet Take 1 tablet (0.5 mg total) by mouth  every 6 (six) hours as needed (Nausea or vomiting). 30 tablet 0  . metoprolol succinate (TOPROL-XL) 25 MG 24 hr tablet TAKE 1 TABLET EVERY DAY 90 tablet 0  . Omega-3 Fatty Acids (FISH OIL) 1000 MG CAPS Take 1 capsule (1,000 mg total) by mouth daily.    . ondansetron (ZOFRAN) 8 MG tablet Take 1 tablet (8 mg total) by mouth 2 (two) times daily as needed for refractory nausea / vomiting. Start on day 3 after chemotherapy. 30 tablet 1  . potassium chloride 20 MEQ/15ML (10%) SOLN Take 15 mLs (20 mEq total) by mouth daily. 473 mL 2  . prochlorperazine (COMPAZINE) 10 MG tablet Take 1 tablet (10 mg total) by mouth every 6 (six) hours as needed (Nausea or vomiting). 30 tablet 1  . simvastatin (ZOCOR) 40 MG tablet TAKE 1 TABLET AT BEDTIME 90 tablet 0  . sodium fluoride (PREVIDENT 5000 PLUS) 1.1 % CREA dental cream Apply cream to tooth brush. Brush teeth for 2 minutes. Spit out excess. DO NOT  rinse afterwards. Repeat nightly. 1 Tube prn  . traMADol (ULTRAM) 50 MG tablet Take 1 tablet (50 mg total) by mouth every 8 (eight) hours as needed. Mail to the patient please. Patient has [place] of $700.00. 60 tablet 1  . VITAMIN E PO Take 1 capsule by mouth daily.    . fluconazole (DIFLUCAN) 100 MG tablet Take 2 tabs on the first day, and then 1 tab daily until finished 22 tablet 0   No current facility-administered medications for this visit.    Facility-Administered Medications Ordered in Other Visits  Medication Dose Route Frequency Provider Last Rate Last Dose  . 0.9 %  sodium chloride infusion   Intravenous Once Tish Men, MD      . 0.9 % NaCl with KCl 20 mEq/ L  infusion   Intravenous Once Tish Men, MD      . heparin lock flush 100 unit/mL  500 Units Intracatheter Once Tish Men, MD      . magnesium sulfate IVPB 2 g 50 mL  2 g Intravenous Once Tish Men, MD      . ondansetron [facility]) 8 mg in sodium chloride 0.9 % 50 mL IVPB   Intravenous Once Tish Men, MD      . sodium chloride flush (NS) 0.9 %  injection 10 mL  10 mL Intracatheter Once Tish Men, MD        PHYSICAL EXAMINATION: ECOG PERFORMANCE STATUS: 2 - Symptomatic, <50% confined to bed  Today's Vitals   02/06/19 0848 02/06/19 0853  BP: 116/77   Pulse: 99   Resp: 18   Temp: 98.5 F (36.9 C)   TempSrc: Temporal   SpO2: 99%   Weight: 177 lb 1.6 oz (80.3 kg)   Height: 5\' 5"  (1.651 m)   PainSc:  0-No pain   Body mass index is 29.47 kg/m.  Filed Weights   02/06/19 0848  Weight: 177 lb 1.6 oz (80.3 kg)    GENERAL: alert, no distress, frail appearing sitting in a wheelchair  SKIN: skin peeling over the bilateral neck from radiation  EYES: conjunctiva are pink and non-injected, sclera clear OROPHARYNX: scattered white plaques over the tongue and a few smaller white spots over the soft palatet NECK: supple, non-tender LYMPH:  Bulky right cervical adenopathy improving but still measures at least 3cm, a smaller left Level II cervical LN ~2cm, mobile, non-tender  LUNGS: clear to auscultation with normal breathing effort HEART: regular rate & rhythm and no murmurs and no lower extremity edema ABDOMEN: soft, non-tender, non-distended, normal bowel sounds Musculoskeletal: no cyanosis of digits and no clubbing  PSYCH: alert & oriented x 3, slowed speech  LABORATORY DATA:  I have reviewed the data as listed    Component Value Date/Time   NA 142 02/06/2019 0750   K 3.1 (L) 02/06/2019 0750   CL 93 (L) 02/06/2019 0750   CO2 34 (H) 02/06/2019 0750   GLUCOSE 103 (H) 02/06/2019 0750   BUN 19 02/06/2019 0750   CREATININE 0.70 02/06/2019 0750   CREATININE 0.89 10/06/2017 1504   CALCIUM 9.5 02/06/2019 0750   PROT 7.2 11/21/2018 1251   ALBUMIN 4.1 11/21/2018 1251   AST 15 11/21/2018 1251   ALT 17 11/21/2018 1251   ALKPHOS 67 11/21/2018 1251   BILITOT 0.4 11/21/2018 1251   GFRNONAA >60 02/06/2019 0750   GFRAA >60 02/06/2019 0750    No results found for: SPEP, UPEP  Lab Results  Component Value Date   WBC 1.5 (L)  02/06/2019  NEUTROABS 1.1 (L) 02/06/2019   HGB 11.5 (L) 02/06/2019   HCT 33.8 (L) 02/06/2019   MCV 93.4 02/06/2019   PLT 182 02/06/2019      Chemistry      Component Value Date/Time   NA 142 02/06/2019 0750   K 3.1 (L) 02/06/2019 0750   CL 93 (L) 02/06/2019 0750   CO2 34 (H) 02/06/2019 0750   BUN 19 02/06/2019 0750   CREATININE 0.70 02/06/2019 0750   CREATININE 0.89 10/06/2017 1504      Component Value Date/Time   CALCIUM 9.5 02/06/2019 0750   ALKPHOS 67 11/21/2018 1251   AST 15 11/21/2018 1251   ALT 17 11/21/2018 1251   BILITOT 0.4 11/21/2018 1251

## 2019-02-05 NOTE — Progress Notes (Signed)
[place] [employer]  Hol[employer] representative received referral from registration/transportation regarding missed appointments and request to reschedule.  CSW spoke with transportation coordinator who reported more difficulty communicating with the patient via phone over the last 2 weeks.  CSW met with patient in the infusion room to offer support and assess for needs.  Patient recently completed her radiation treatment and stated she was feeling "better today".  CSW and patient discussed transportation and difficulties with patients phone.  Patient stated she was having issues with her phone last week, but has not had any issues today.  Patient did not report any needs or concerns when ask about how things were at home.  Patient stated she felt comfortable with transportation to her remaining appointments and knows to call CSW with questions or concerns.    Johnnye Lana, MSW, LCSW, OSW-C Clinical Social Worker [facility] [phone]

## 2019-02-05 NOTE — Patient Instructions (Signed)
Dehydration, Adult  Dehydration is when there is not enough fluid or water in your body. This happens when you lose more fluids than you take in. Dehydration can range from mild to very bad. It should be treated right away to keep it from getting very bad. Symptoms of mild dehydration may include:  Thirst.  Dry lips.  Slightly dry mouth.  Dry, warm skin.  Dizziness. Symptoms of moderate dehydration may include:  Very dry mouth.  Muscle cramps.  Dark pee (urine). Pee may be the color of tea.  Your body making less pee.  Your eyes making fewer tears.  Heartbeat that is uneven or faster than normal (palpitations).  Headache.  Light-headedness, especially when you stand up from sitting.  Fainting (syncope). Symptoms of very bad dehydration may include:  Changes in skin, such as: ? Cold and clammy skin. ? Blotchy (mottled) or pale skin. ? Skin that does not quickly return to normal after being lightly pinched and let go (poor skin turgor).  Changes in body fluids, such as: ? Feeling very thirsty. ? Your eyes making fewer tears. ? Not sweating when body temperature is high, such as in hot weather. ? Your body making very little pee.  Changes in vital signs, such as: ? Weak pulse. ? Pulse that is more than 100 beats a minute when you are sitting still. ? Fast breathing. ? Low blood pressure.  Other changes, such as: ? Sunken eyes. ? Cold hands and feet. ? Confusion. ? Lack of energy (lethargy). ? Trouble waking up from sleep. ? Short-term weight loss. ? Unconsciousness. Follow these instructions at home:   If told by your doctor, drink an ORS: ? Make an ORS by using instructions on the package. ? Start by drinking small amounts, about  cup (120 mL) every 5-10 minutes. ? Slowly drink more until you have had the amount that your doctor said to have.  Drink enough clear fluid to keep your pee clear or pale yellow. If you were told to drink an ORS, finish the  ORS first, then start slowly drinking clear fluids. Drink fluids such as: ? Water. Do not drink only water by itself. Doing that can make the salt (sodium) level in your body get too low (hyponatremia). ? Ice chips. ? Fruit juice that you have added water to (diluted). ? Low-calorie sports drinks.  Avoid: ? Alcohol. ? Drinks that have a lot of sugar. These include high-calorie sports drinks, fruit juice that does not have water added, and soda. ? Caffeine. ? Foods that are greasy or have a lot of fat or sugar.  Take over-the-counter and prescription medicines only as told by your doctor.  Do not take salt tablets. Doing that can make the salt level in your body get too high (hypernatremia).  Eat foods that have minerals (electrolytes). Examples include bananas, oranges, potatoes, tomatoes, and spinach.  Keep all follow-up visits as told by your doctor. This is important. Contact a doctor if:  You have belly (abdominal) pain that: ? Gets worse. ? Stays in one area (localizes).  You have a rash.  You have a stiff neck.  You get angry or annoyed more easily than normal (irritability).  You are more sleepy than normal.  You have a harder time waking up than normal.  You feel: ? Weak. ? Dizzy. ? Very thirsty.  You have peed (urinated) only a small amount of very dark pee during 6-8 hours. Get help right away if:  You have   symptoms of very bad dehydration.  You cannot drink fluids without throwing up (vomiting).  Your symptoms get worse with treatment.  You have a fever.  You have a very bad headache.  You are throwing up or having watery poop (diarrhea) and it: ? Gets worse. ? Does not go away.  You have blood or something green (bile) in your throw-up.  You have blood in your poop (stool). This may cause poop to look black and tarry.  You have not peed in 6-8 hours.  You pass out (faint).  Your heart rate when you are sitting still is more than 100 beats a  minute.  You have trouble breathing. This information is not intended to replace advice given to you by your health care provider. Make sure you discuss any questions you have with your health care provider. Document Released: 05/14/2009 Document Revised: 06/30/2017 Document Reviewed: 09/11/2015 Elsevier Patient Education  2020 [employer]  

## 2019-02-06 ENCOUNTER — Inpatient Hospital Stay: Payer: Medicare HMO

## 2019-02-06 ENCOUNTER — Other Ambulatory Visit: Payer: Self-pay

## 2019-02-06 ENCOUNTER — Inpatient Hospital Stay (HOSPITAL_BASED_OUTPATIENT_CLINIC_OR_DEPARTMENT_OTHER): Payer: Medicare HMO | Admitting: Hematology

## 2019-02-06 ENCOUNTER — Encounter: Payer: Self-pay | Admitting: Hematology

## 2019-02-06 ENCOUNTER — Telehealth: Payer: Self-pay | Admitting: *Deleted

## 2019-02-06 VITALS — BP 116/77 | HR 99 | Temp 98.5°F | Resp 18 | Ht 65.0 in | Wt 177.1 lb

## 2019-02-06 DIAGNOSIS — Z95828 Presence of other vascular implants and grafts: Secondary | ICD-10-CM

## 2019-02-06 DIAGNOSIS — D72819 Decreased white blood cell count, unspecified: Secondary | ICD-10-CM

## 2019-02-06 DIAGNOSIS — E46 Unspecified protein-calorie malnutrition: Secondary | ICD-10-CM

## 2019-02-06 DIAGNOSIS — K118 Other diseases of salivary glands: Secondary | ICD-10-CM | POA: Diagnosis not present

## 2019-02-06 DIAGNOSIS — C321 Malignant neoplasm of supraglottis: Secondary | ICD-10-CM

## 2019-02-06 DIAGNOSIS — R110 Nausea: Secondary | ICD-10-CM

## 2019-02-06 DIAGNOSIS — B370 Candidal stomatitis: Secondary | ICD-10-CM

## 2019-02-06 DIAGNOSIS — D701 Agranulocytosis secondary to cancer chemotherapy: Secondary | ICD-10-CM

## 2019-02-06 DIAGNOSIS — Z79899 Other long term (current) drug therapy: Secondary | ICD-10-CM

## 2019-02-06 DIAGNOSIS — T451X5A Adverse effect of antineoplastic and immunosuppressive drugs, initial encounter: Secondary | ICD-10-CM | POA: Diagnosis not present

## 2019-02-06 DIAGNOSIS — D709 Neutropenia, unspecified: Secondary | ICD-10-CM

## 2019-02-06 DIAGNOSIS — R599 Enlarged lymph nodes, unspecified: Secondary | ICD-10-CM

## 2019-02-06 DIAGNOSIS — E876 Hypokalemia: Secondary | ICD-10-CM | POA: Insufficient documentation

## 2019-02-06 DIAGNOSIS — Z7982 Long term (current) use of aspirin: Secondary | ICD-10-CM

## 2019-02-06 DIAGNOSIS — I700 Atherosclerosis of aorta: Secondary | ICD-10-CM

## 2019-02-06 DIAGNOSIS — D6481 Anemia due to antineoplastic chemotherapy: Secondary | ICD-10-CM

## 2019-02-06 LAB — BASIC METABOLIC PANEL - CANCER CENTER ONLY
Anion gap: 15 (ref 5–15)
BUN: 19 mg/dL (ref 8–23)
CO2: 34 mmol/L — ABNORMAL HIGH (ref 22–32)
Calcium: 9.5 mg/dL (ref 8.9–10.3)
Chloride: 93 mmol/L — ABNORMAL LOW (ref 98–111)
Creatinine: 0.7 mg/dL (ref 0.44–1.00)
GFR, Est AFR Am: >60 mL/min (ref >60)
GFR, Estimated: >60 mL/min (ref >60)
Glucose, Bld: 103 mg/dL — ABNORMAL HIGH (ref 70–99)
Potassium: 3.1 mmol/L — ABNORMAL LOW (ref 3.5–5.1)
Sodium: 142 mmol/L (ref 135–145)

## 2019-02-06 LAB — CBC WITH DIFFERENTIAL (CANCER CENTER ONLY)
Abs Immature Granulocytes: 0.09 10*3/uL — ABNORMAL HIGH (ref 0.00–0.07)
Basophils Absolute: 0 10*3/uL (ref 0.0–0.1)
Basophils Relative: 1 %
Eosinophils Absolute: 0 10*3/uL (ref 0.0–0.5)
Eosinophils Relative: 0 %
HCT: 33.8 % — ABNORMAL LOW (ref 36.0–46.0)
Hemoglobin: 11.5 g/dL — ABNORMAL LOW (ref 12.0–15.0)
Immature Granulocytes: 6 %
Lymphocytes Relative: 11 %
Lymphs Abs: 0.2 10*3/uL — ABNORMAL LOW (ref 0.7–4.0)
MCH: 31.8 pg (ref 26.0–34.0)
MCHC: 34 g/dL (ref 30.0–36.0)
MCV: 93.4 fL (ref 80.0–100.0)
Monocytes Absolute: 0.2 10*3/uL (ref 0.1–1.0)
Monocytes Relative: 11 %
Neutro Abs: 1.1 10*3/uL — ABNORMAL LOW (ref 1.7–7.7)
Neutrophils Relative %: 71 %
Platelet Count: 182 10*3/uL (ref 150–400)
RBC: 3.62 MIL/uL — ABNORMAL LOW (ref 3.87–5.11)
RDW: 15.3 % (ref 11.5–15.5)
WBC Count: 1.5 10*3/uL — ABNORMAL LOW (ref 4.0–10.5)
nRBC: 0 % (ref 0.0–0.2)

## 2019-02-06 LAB — MAGNESIUM: Magnesium: 1.7 mg/dL (ref 1.7–2.4)

## 2019-02-06 MED ORDER — POTASSIUM CHLORIDE IN NACL 20-0.9 MEQ/L-% IV SOLN
Freq: Once | INTRAVENOUS | Status: AC
  Administered 2019-02-06: 10:00:00 via INTRAVENOUS
  Filled 2019-02-06: qty 1000

## 2019-02-06 MED ORDER — SODIUM CHLORIDE 0.9 % IV SOLN
Freq: Once | INTRAVENOUS | Status: AC
  Administered 2019-02-06: 10:00:00 via INTRAVENOUS
  Filled 2019-02-06: qty 250

## 2019-02-06 MED ORDER — SODIUM CHLORIDE 0.9 % IV SOLN: Freq: Once | INTRAVENOUS | Status: DC

## 2019-02-06 MED ORDER — ONDANSETRON HCL 4 MG/2ML IJ SOLN
INTRAMUSCULAR | Status: AC
  Filled 2019-02-06: qty 4

## 2019-02-06 MED ORDER — SODIUM CHLORIDE 0.9% FLUSH
10.0000 mL | Freq: Once | INTRAVENOUS | Status: AC
  Administered 2019-02-06: 12:00:00 10 mL
  Filled 2019-02-06: qty 10

## 2019-02-06 MED ORDER — POTASSIUM CHLORIDE 20 MEQ/15ML (10%) PO SOLN: 20.0000 meq | Freq: Every day | ORAL | 2 refills | Status: DC

## 2019-02-06 MED ORDER — MAGNESIUM SULFATE 2 GM/50ML IV SOLN
2.0000 g | Freq: Once | INTRAVENOUS | Status: AC
  Administered 2019-02-06: 10:00:00 2 g via INTRAVENOUS
  Filled 2019-02-06: qty 50

## 2019-02-06 MED ORDER — FLUCONAZOLE 100 MG PO TABS: ORAL_TABLET | ORAL | 0 refills | Status: AC

## 2019-02-06 MED ORDER — ONDANSETRON HCL 4 MG/2ML IJ SOLN
8.0000 mg | Freq: Once | INTRAMUSCULAR | Status: AC
  Administered 2019-02-06: 10:00:00 8 mg via INTRAVENOUS

## 2019-02-06 MED ORDER — SODIUM CHLORIDE 0.9% FLUSH
10.0000 mL | Freq: Once | INTRAVENOUS | Status: AC
  Administered 2019-02-06: 10 mL
  Filled 2019-02-06: qty 10

## 2019-02-06 MED ORDER — HEPARIN SOD (PORK) LOCK FLUSH 100 UNIT/ML IV SOLN
500.0000 [IU] | Freq: Once | INTRAVENOUS | Status: AC
  Administered 2019-02-06: 12:00:00 500 [IU]
  Filled 2019-02-06: qty 5

## 2019-02-06 MED FILL — FLUCONAZOLE 100 MG TAB: 100 | 21 days supply | Qty: 22 | Fill #0

## 2019-02-06 NOTE — Telephone Encounter (Signed)
Called patient to inquire about whether she received her [employer], voice mail full, unable to leave message.

## 2019-02-06 NOTE — Patient Instructions (Signed)
[facility] Discharge Instructions for Patients R BELOW ARE SYMPTOMS THAT SHOULD BE REPORTED IMMEDIATELY:  *FEVER GREATER THAN 100.5 F  *CHILLS WITH OR WITHOUT FEVER  NAUSEA AND VOMITING THAT IS NOT CONTROLLED WITH YOUR NAUSEA MEDICATION  *UNUSUAL SHORTNESS OF BREATH  *UNUSUAL BRUISING OR BLEEDING  TENDERNESS IN MOUTH AND THROAT WITH OR WITHOUT PRESENCE OF ULCERS  *URINARY PROBLEMS  *BOWEL PROBLEMS  UNUSUAL RASH Items with * indicate a potential emergency and should be followed up as soon as possible.  Feel free to call the clinic should you have any questions or concerns. The clinic phone number is [phone].  Please show the [facility] at check-in to the Emergency Department and triage nurse.     Rehydration, Adult Rehydration is the replacement of body fluids and salts and minerals (electrolytes) that are lost during dehydration. Dehydration is when there is not enough fluid or water in the body. This happens when you lose more fluids than you take in. Common causes of dehydration include:  Vomiting.  Diarrhea.  Excessive sweating, such as from heat exposure or exercise.  Taking medicines that cause the body to lose excess fluid (diuretics).  Impaired kidney function.  Not drinking enough fluid.  Certain illnesses or infections.  Certain poorly controlled long-term (chronic) illnesses, such as diabetes, heart disease, and kidney disease.  Symptoms of mild dehydration may include thirst, dry lips and mouth, dry skin, and dizziness. Symptoms of severe dehydration may include increased heart rate, confusion, fainting, and not urinating. You can rehydrate by drinking certain fluids or getting fluids through an IV tube, as told by your health care provider. What are the risks? Generally, rehydration is safe. However, one problem that can happen is taking in too much fluid (overhydration). This is rare. If overhydration happens, it can cause  an electrolyte imbalance, kidney failure, or a decrease in salt (sodium) levels in the body. How to rehydrate Follow instructions from your health care provider for rehydration. The kind of fluid you should drink and the amount you should drink depend on your condition.  If directed by your health care provider, drink an oral rehydration solution (ORS). This is a drink designed to treat dehydration that is found in pharmacies and retail stores. ? Make an ORS by following instructions on the package. ? Start by drinking small amounts, about  cup (120 mL) every 5-10 minutes. ? Slowly increase how much you drink until you have taken the amount recommended by your health care provider.  Drink enough clear fluids to keep your urine clear or pale yellow. If you were instructed to drink an ORS, finish the ORS first, then start slowly drinking other clear fluids. Drink fluids such as: ? Water. Do not drink only water. Doing that can lead to having too little sodium in your body (hyponatremia). ? Ice chips. ? Fruit juice that you have added water to (diluted juice). ? Low-calorie sports drinks.  If you are severely dehydrated, your health care provider may recommend that you receive fluids through an IV tube in the hospital.  Do not take sodium tablets. Doing that can lead to the condition of having too much sodium in your body (hypernatremia). Eating while you rehydrate Follow instructions from your health care provider about what to eat while you rehydrate. Your health care provider may recommend that you slowly begin eating regular foods in small amounts.  Eat foods that contain a healthy balance of electrolytes, such as bananas, oranges, potatoes, tomatoes,  and spinach.  Avoid foods that are greasy or contain a lot of fat or sugar.  In some cases, you may get nutrition through a feeding tube that is passed through your nose and into your stomach (nasogastric tube, or NG tube). This may be done  if you have uncontrolled vomiting or diarrhea. Beverages to avoid Certain beverages may make dehydration worse. While you rehydrate, avoid:  Alcohol.  Caffeine.  Drinks that contain a lot of sugar. These include: ? High-calorie sports drinks. ? Fruit juice that is not diluted. ? Soda.  Check nutrition labels to see how much sugar or caffeine a beverage contains. Signs of dehydration recovery You may be recovering from dehydration if:  You are urinating more often than before you started rehydrating.  Your urine is clear or pale yellow.  Your energy level improves.  You vomit less frequently.  You have diarrhea less frequently.  Your appetite improves or returns to normal.  You feel less dizzy or less light-headed.  Your skin tone and color start to look more normal. Contact a health care provider if:  You continue to have symptoms of mild dehydration, such as: ? Thirst. ? Dry lips. ? Slightly dry mouth. ? Dry, warm skin. ? Dizziness.  You continue to vomit or have diarrhea. Get help right away if:  You have symptoms of dehydration that get worse.  You feel: ? Confused. ? Weak. ? Like you are going to faint.  You have not urinated in 6-8 hours.  You have very dark urine.  You have trouble breathing.  Your heart rate while sitting still is over 100 beats a minute.  You cannot drink fluids without vomiting.  You have vomiting or diarrhea that: ? Gets worse. ? Does not go away.  You have a fever. This information is not intended to replace advice given to you by your health care provider. Make sure you discuss any questions you have with your health care provider. Document Released: 10/10/2011 Document Revised: 06/30/2017 Document Reviewed: 09/11/2015 Elsevier Patient Education  2020 [name].

## 2019-02-06 NOTE — Patient Instructions (Signed)

## 2019-02-07 ENCOUNTER — Telehealth: Payer: Self-pay

## 2019-02-07 ENCOUNTER — Inpatient Hospital Stay: Payer: Medicare HMO

## 2019-02-07 ENCOUNTER — Telehealth: Payer: Self-pay | Admitting: *Deleted

## 2019-02-07 ENCOUNTER — Telehealth: Payer: Self-pay | Admitting: Internal Medicine

## 2019-02-07 ENCOUNTER — Other Ambulatory Visit: Payer: Self-pay

## 2019-02-07 ENCOUNTER — Telehealth: Payer: Self-pay | Admitting: Hematology

## 2019-02-07 VITALS — BP 130/84 | HR 107 | Temp 98.3°F | Resp 20

## 2019-02-07 DIAGNOSIS — B370 Candidal stomatitis: Secondary | ICD-10-CM | POA: Diagnosis not present

## 2019-02-07 DIAGNOSIS — C321 Malignant neoplasm of supraglottis: Secondary | ICD-10-CM

## 2019-02-07 DIAGNOSIS — D72819 Decreased white blood cell count, unspecified: Secondary | ICD-10-CM | POA: Diagnosis not present

## 2019-02-07 DIAGNOSIS — E876 Hypokalemia: Secondary | ICD-10-CM | POA: Diagnosis not present

## 2019-02-07 DIAGNOSIS — D709 Neutropenia, unspecified: Secondary | ICD-10-CM | POA: Diagnosis not present

## 2019-02-07 DIAGNOSIS — E46 Unspecified protein-calorie malnutrition: Secondary | ICD-10-CM | POA: Diagnosis not present

## 2019-02-07 DIAGNOSIS — K118 Other diseases of salivary glands: Secondary | ICD-10-CM | POA: Diagnosis not present

## 2019-02-07 DIAGNOSIS — T451X5A Adverse effect of antineoplastic and immunosuppressive drugs, initial encounter: Secondary | ICD-10-CM | POA: Diagnosis not present

## 2019-02-07 DIAGNOSIS — Z95828 Presence of other vascular implants and grafts: Secondary | ICD-10-CM

## 2019-02-07 MED ORDER — SODIUM CHLORIDE 0.9% FLUSH
10.0000 mL | Freq: Once | INTRAVENOUS | Status: AC
  Administered 2019-02-07: 10 mL
  Filled 2019-02-07: qty 10

## 2019-02-07 MED ORDER — HEPARIN SOD (PORK) LOCK FLUSH 100 UNIT/ML IV SOLN
500.0000 [IU] | Freq: Once | INTRAVENOUS | Status: AC
  Administered 2019-02-07: 500 [IU]
  Filled 2019-02-07: qty 5

## 2019-02-07 MED ORDER — SODIUM CHLORIDE 0.9 % IV SOLN
Freq: Once | INTRAVENOUS | Status: AC
  Administered 2019-02-07: 11:00:00 via INTRAVENOUS
  Filled 2019-02-07: qty 250

## 2019-02-07 NOTE — Telephone Encounter (Signed)
Patient came in without scheduled appt. Was able to work her in with infusion. Scheduled per los, gave calendar to patient

## 2019-02-07 NOTE — Telephone Encounter (Signed)
Pt does not show that she has appointment today on Epic and I spoke to [place] and confirmed she is not on the list for treatment today. She advised she will speak to Amy. [place] asked to get taken

## 2019-02-07 NOTE — Telephone Encounter (Signed)
Attempted to reach patient to schedule her appt for today but NO answer and VM box is full. Will follow up later today.

## 2019-02-07 NOTE — Telephone Encounter (Signed)
CALLED [facility] ([place]) IN [place] TO PROVIDE Y. SUCTION DEVICE FOR THIS PATIENT, FAXED ORDER TO [place] AND SHE WILL TAKE CARE OF THIS, THE OTHER AGENCY WOULD NOT PROVIDE DME (FAX NUMBER - [phone].

## 2019-02-07 NOTE — Patient Instructions (Signed)
[facility] Discharge Instructions for Patients R BELOW ARE SYMPTOMS THAT SHOULD BE REPORTED IMMEDIATELY:  *FEVER GREATER THAN 100.5 F  *CHILLS WITH OR WITHOUT FEVER  NAUSEA AND VOMITING THAT IS NOT CONTROLLED WITH YOUR NAUSEA MEDICATION  *UNUSUAL SHORTNESS OF BREATH  *UNUSUAL BRUISING OR BLEEDING  TENDERNESS IN MOUTH AND THROAT WITH OR WITHOUT PRESENCE OF ULCERS  *URINARY PROBLEMS  *BOWEL PROBLEMS  UNUSUAL RASH Items with * indicate a potential emergency and should be followed up as soon as possible.  Feel free to call the clinic should you have any questions or concerns. The clinic phone number is [phone].  Please show the [facility] at check-in to the Emergency Department and triage nurse.     Rehydration, Adult Rehydration is the replacement of body fluids and salts and minerals (electrolytes) that are lost during dehydration. Dehydration is when there is not enough fluid or water in the body. This happens when you lose more fluids than you take in. Common causes of dehydration include:  Vomiting.  Diarrhea.  Excessive sweating, such as from heat exposure or exercise.  Taking medicines that cause the body to lose excess fluid (diuretics).  Impaired kidney function.  Not drinking enough fluid.  Certain illnesses or infections.  Certain poorly controlled long-term (chronic) illnesses, such as diabetes, heart disease, and kidney disease.  Symptoms of mild dehydration may include thirst, dry lips and mouth, dry skin, and dizziness. Symptoms of severe dehydration may include increased heart rate, confusion, fainting, and not urinating. You can rehydrate by drinking certain fluids or getting fluids through an IV tube, as told by your health care provider. What are the risks? Generally, rehydration is safe. However, one problem that can happen is taking in too much fluid (overhydration). This is rare. If overhydration happens, it can cause  an electrolyte imbalance, kidney failure, or a decrease in salt (sodium) levels in the body. How to rehydrate Follow instructions from your health care provider for rehydration. The kind of fluid you should drink and the amount you should drink depend on your condition.  If directed by your health care provider, drink an oral rehydration solution (ORS). This is a drink designed to treat dehydration that is found in pharmacies and retail stores. ? Make an ORS by following instructions on the package. ? Start by drinking small amounts, about  cup (120 mL) every 5-10 minutes. ? Slowly increase how much you drink until you have taken the amount recommended by your health care provider.  Drink enough clear fluids to keep your urine clear or pale yellow. If you were instructed to drink an ORS, finish the ORS first, then start slowly drinking other clear fluids. Drink fluids such as: ? Water. Do not drink only water. Doing that can lead to having too little sodium in your body (hyponatremia). ? Ice chips. ? Fruit juice that you have added water to (diluted juice). ? Low-calorie sports drinks.  If you are severely dehydrated, your health care provider may recommend that you receive fluids through an IV tube in the hospital.  Do not take sodium tablets. Doing that can lead to the condition of having too much sodium in your body (hypernatremia). Eating while you rehydrate Follow instructions from your health care provider about what to eat while you rehydrate. Your health care provider may recommend that you slowly begin eating regular foods in small amounts.  Eat foods that contain a healthy balance of electrolytes, such as bananas, oranges, potatoes, tomatoes,  and spinach.  Avoid foods that are greasy or contain a lot of fat or sugar.  In some cases, you may get nutrition through a feeding tube that is passed through your nose and into your stomach (nasogastric tube, or NG tube). This may be done  if you have uncontrolled vomiting or diarrhea. Beverages to avoid Certain beverages may make dehydration worse. While you rehydrate, avoid:  Alcohol.  Caffeine.  Drinks that contain a lot of sugar. These include: ? High-calorie sports drinks. ? Fruit juice that is not diluted. ? Soda.  Check nutrition labels to see how much sugar or caffeine a beverage contains. Signs of dehydration recovery You may be recovering from dehydration if:  You are urinating more often than before you started rehydrating.  Your urine is clear or pale yellow.  Your energy level improves.  You vomit less frequently.  You have diarrhea less frequently.  Your appetite improves or returns to normal.  You feel less dizzy or less light-headed.  Your skin tone and color start to look more normal. Contact a health care provider if:  You continue to have symptoms of mild dehydration, such as: ? Thirst. ? Dry lips. ? Slightly dry mouth. ? Dry, warm skin. ? Dizziness.  You continue to vomit or have diarrhea. Get help right away if:  You have symptoms of dehydration that get worse.  You feel: ? Confused. ? Weak. ? Like you are going to faint.  You have not urinated in 6-8 hours.  You have very dark urine.  You have trouble breathing.  Your heart rate while sitting still is over 100 beats a minute.  You cannot drink fluids without vomiting.  You have vomiting or diarrhea that: ? Gets worse. ? Does not go away.  You have a fever. This information is not intended to replace advice given to you by your health care provider. Make sure you discuss any questions you have with your health care provider. Document Released: 10/10/2011 Document Revised: 06/30/2017 Document Reviewed: 09/11/2015 Elsevier Patient Education  2020 [name].

## 2019-02-08 ENCOUNTER — Other Ambulatory Visit: Payer: Self-pay

## 2019-02-08 ENCOUNTER — Inpatient Hospital Stay: Payer: Medicare HMO

## 2019-02-08 VITALS — BP 110/72 | HR 92 | Temp 98.0°F | Resp 19

## 2019-02-08 DIAGNOSIS — D709 Neutropenia, unspecified: Secondary | ICD-10-CM | POA: Diagnosis not present

## 2019-02-08 DIAGNOSIS — C321 Malignant neoplasm of supraglottis: Secondary | ICD-10-CM | POA: Diagnosis not present

## 2019-02-08 DIAGNOSIS — D72819 Decreased white blood cell count, unspecified: Secondary | ICD-10-CM | POA: Diagnosis not present

## 2019-02-08 DIAGNOSIS — K118 Other diseases of salivary glands: Secondary | ICD-10-CM | POA: Diagnosis not present

## 2019-02-08 DIAGNOSIS — Z95828 Presence of other vascular implants and grafts: Secondary | ICD-10-CM

## 2019-02-08 DIAGNOSIS — B370 Candidal stomatitis: Secondary | ICD-10-CM | POA: Diagnosis not present

## 2019-02-08 DIAGNOSIS — E876 Hypokalemia: Secondary | ICD-10-CM | POA: Diagnosis not present

## 2019-02-08 DIAGNOSIS — T451X5A Adverse effect of antineoplastic and immunosuppressive drugs, initial encounter: Secondary | ICD-10-CM | POA: Diagnosis not present

## 2019-02-08 DIAGNOSIS — E46 Unspecified protein-calorie malnutrition: Secondary | ICD-10-CM | POA: Diagnosis not present

## 2019-02-08 MED ORDER — SODIUM CHLORIDE 0.9% FLUSH
10.0000 mL | Freq: Once | INTRAVENOUS | Status: AC
  Administered 2019-02-08: 10 mL
  Filled 2019-02-08: qty 10

## 2019-02-08 MED ORDER — SODIUM CHLORIDE 0.9 % IV SOLN
Freq: Once | INTRAVENOUS | Status: AC
  Administered 2019-02-08: 11:00:00 via INTRAVENOUS
  Filled 2019-02-08: qty 250

## 2019-02-08 MED ORDER — HEPARIN SOD (PORK) LOCK FLUSH 100 UNIT/ML IV SOLN
500.0000 [IU] | Freq: Once | INTRAVENOUS | Status: AC
  Administered 2019-02-08: 13:00:00 500 [IU]
  Filled 2019-02-08: qty 5

## 2019-02-08 MED ORDER — SODIUM CHLORIDE 0.9 % IV SOLN: Freq: Once | INTRAVENOUS | Status: DC

## 2019-02-08 MED ORDER — ONDANSETRON HCL 4 MG/2ML IJ SOLN
8.0000 mg | Freq: Once | INTRAMUSCULAR | Status: AC
  Administered 2019-02-08: 8 mg via INTRAVENOUS

## 2019-02-08 NOTE — Patient Instructions (Signed)
Dehydration, Adult  Dehydration is when there is not enough fluid or water in your body. This happens when you lose more fluids than you take in. Dehydration can range from mild to very bad. It should be treated right away to keep it from getting very bad. Symptoms of mild dehydration may include:  Thirst.  Dry lips.  Slightly dry mouth.  Dry, warm skin.  Dizziness. Symptoms of moderate dehydration may include:  Very dry mouth.  Muscle cramps.  Dark pee (urine). Pee may be the color of tea.  Your body making less pee.  Your eyes making fewer tears.  Heartbeat that is uneven or faster than normal (palpitations).  Headache.  Light-headedness, especially when you stand up from sitting.  Fainting (syncope). Symptoms of very bad dehydration may include:  Changes in skin, such as: ? Cold and clammy skin. ? Blotchy (mottled) or pale skin. ? Skin that does not quickly return to normal after being lightly pinched and let go (poor skin turgor).  Changes in body fluids, such as: ? Feeling very thirsty. ? Your eyes making fewer tears. ? Not sweating when body temperature is high, such as in hot weather. ? Your body making very little pee.  Changes in vital signs, such as: ? Weak pulse. ? Pulse that is more than 100 beats a minute when you are sitting still. ? Fast breathing. ? Low blood pressure.  Other changes, such as: ? Sunken eyes. ? Cold hands and feet. ? Confusion. ? Lack of energy (lethargy). ? Trouble waking up from sleep. ? Short-term weight loss. ? Unconsciousness. Follow these instructions at home:   If told by your doctor, drink an ORS: ? Make an ORS by using instructions on the package. ? Start by drinking small amounts, about  cup (120 mL) every 5-10 minutes. ? Slowly drink more until you have had the amount that your doctor said to have.  Drink enough clear fluid to keep your pee clear or pale yellow. If you were told to drink an ORS, finish the  ORS first, then start slowly drinking clear fluids. Drink fluids such as: ? Water. Do not drink only water by itself. Doing that can make the salt (sodium) level in your body get too low (hyponatremia). ? Ice chips. ? Fruit juice that you have added water to (diluted). ? Low-calorie sports drinks.  Avoid: ? Alcohol. ? Drinks that have a lot of sugar. These include high-calorie sports drinks, fruit juice that does not have water added, and soda. ? Caffeine. ? Foods that are greasy or have a lot of fat or sugar.  Take over-the-counter and prescription medicines only as told by your doctor.  Do not take salt tablets. Doing that can make the salt level in your body get too high (hypernatremia).  Eat foods that have minerals (electrolytes). Examples include bananas, oranges, potatoes, tomatoes, and spinach.  Keep all follow-up visits as told by your doctor. This is important. Contact a doctor if:  You have belly (abdominal) pain that: ? Gets worse. ? Stays in one area (localizes).  You have a rash.  You have a stiff neck.  You get angry or annoyed more easily than normal (irritability).  You are more sleepy than normal.  You have a harder time waking up than normal.  You feel: ? Weak. ? Dizzy. ? Very thirsty.  You have peed (urinated) only a small amount of very dark pee during 6-8 hours. Get help right away if:  You have   symptoms of very bad dehydration.  You cannot drink fluids without throwing up (vomiting).  Your symptoms get worse with treatment.  You have a fever.  You have a very bad headache.  You are throwing up or having watery poop (diarrhea) and it: ? Gets worse. ? Does not go away.  You have blood or something green (bile) in your throw-up.  You have blood in your poop (stool). This may cause poop to look black and tarry.  You have not peed in 6-8 hours.  You pass out (faint).  Your heart rate when you are sitting still is more than 100 beats a  minute.  You have trouble breathing. This information is not intended to replace advice given to you by your health care provider. Make sure you discuss any questions you have with your health care provider. Document Released: 05/14/2009 Document Revised: 06/30/2017 Document Reviewed: 09/11/2015 Elsevier Patient Education  2020 [employer]  

## 2019-02-11 ENCOUNTER — Inpatient Hospital Stay: Payer: Medicare HMO

## 2019-02-11 ENCOUNTER — Other Ambulatory Visit: Payer: Self-pay

## 2019-02-11 VITALS — BP 107/81 | HR 109 | Temp 99.1°F | Resp 20

## 2019-02-11 DIAGNOSIS — E46 Unspecified protein-calorie malnutrition: Secondary | ICD-10-CM | POA: Diagnosis not present

## 2019-02-11 DIAGNOSIS — K118 Other diseases of salivary glands: Secondary | ICD-10-CM | POA: Diagnosis not present

## 2019-02-11 DIAGNOSIS — E876 Hypokalemia: Secondary | ICD-10-CM | POA: Diagnosis not present

## 2019-02-11 DIAGNOSIS — B370 Candidal stomatitis: Secondary | ICD-10-CM | POA: Diagnosis not present

## 2019-02-11 DIAGNOSIS — C321 Malignant neoplasm of supraglottis: Secondary | ICD-10-CM

## 2019-02-11 DIAGNOSIS — D72819 Decreased white blood cell count, unspecified: Secondary | ICD-10-CM | POA: Diagnosis not present

## 2019-02-11 DIAGNOSIS — D709 Neutropenia, unspecified: Secondary | ICD-10-CM | POA: Diagnosis not present

## 2019-02-11 DIAGNOSIS — Z95828 Presence of other vascular implants and grafts: Secondary | ICD-10-CM

## 2019-02-11 DIAGNOSIS — T451X5A Adverse effect of antineoplastic and immunosuppressive drugs, initial encounter: Secondary | ICD-10-CM | POA: Diagnosis not present

## 2019-02-11 MED ORDER — SODIUM CHLORIDE 0.9% FLUSH
10.0000 mL | Freq: Once | INTRAVENOUS | Status: AC
  Administered 2019-02-11: 10 mL
  Filled 2019-02-11: qty 10

## 2019-02-11 MED ORDER — SODIUM CHLORIDE 0.9 % IV SOLN
Freq: Once | INTRAVENOUS | Status: AC
  Administered 2019-02-11: 09:00:00 via INTRAVENOUS
  Filled 2019-02-11: qty 250

## 2019-02-11 MED ORDER — HEPARIN SOD (PORK) LOCK FLUSH 100 UNIT/ML IV SOLN
500.0000 [IU] | Freq: Once | INTRAVENOUS | Status: AC
  Administered 2019-02-11: 11:00:00 500 [IU]
  Filled 2019-02-11: qty 5

## 2019-02-11 MED ORDER — ONDANSETRON HCL 4 MG/2ML IJ SOLN
INTRAMUSCULAR | Status: AC
  Filled 2019-02-11: qty 4

## 2019-02-11 MED ORDER — SODIUM CHLORIDE 0.9 % IV SOLN: Freq: Once | INTRAVENOUS | Status: DC

## 2019-02-11 MED ORDER — ONDANSETRON HCL 4 MG/2ML IJ SOLN
8.0000 mg | Freq: Once | INTRAMUSCULAR | Status: AC
  Administered 2019-02-11: 8 mg via INTRAVENOUS

## 2019-02-11 NOTE — Patient Instructions (Signed)
Dehydration, Adult  Dehydration is when there is not enough fluid or water in your body. This happens when you lose more fluids than you take in. Dehydration can range from mild to very bad. It should be treated right away to keep it from getting very bad. Symptoms of mild dehydration may include:  Thirst.  Dry lips.  Slightly dry mouth.  Dry, warm skin.  Dizziness. Symptoms of moderate dehydration may include:  Very dry mouth.  Muscle cramps.  Dark pee (urine). Pee may be the color of tea.  Your body making less pee.  Your eyes making fewer tears.  Heartbeat that is uneven or faster than normal (palpitations).  Headache.  Light-headedness, especially when you stand up from sitting.  Fainting (syncope). Symptoms of very bad dehydration may include:  Changes in skin, such as: ? Cold and clammy skin. ? Blotchy (mottled) or pale skin. ? Skin that does not quickly return to normal after being lightly pinched and let go (poor skin turgor).  Changes in body fluids, such as: ? Feeling very thirsty. ? Your eyes making fewer tears. ? Not sweating when body temperature is high, such as in hot weather. ? Your body making very little pee.  Changes in vital signs, such as: ? Weak pulse. ? Pulse that is more than 100 beats a minute when you are sitting still. ? Fast breathing. ? Low blood pressure.  Other changes, such as: ? Sunken eyes. ? Cold hands and feet. ? Confusion. ? Lack of energy (lethargy). ? Trouble waking up from sleep. ? Short-term weight loss. ? Unconsciousness. Follow these instructions at home:   If told by your doctor, drink an ORS: ? Make an ORS by using instructions on the package. ? Start by drinking small amounts, about  cup (120 mL) every 5-10 minutes. ? Slowly drink more until you have had the amount that your doctor said to have.  Drink enough clear fluid to keep your pee clear or pale yellow. If you were told to drink an ORS, finish the  ORS first, then start slowly drinking clear fluids. Drink fluids such as: ? Water. Do not drink only water by itself. Doing that can make the salt (sodium) level in your body get too low (hyponatremia). ? Ice chips. ? Fruit juice that you have added water to (diluted). ? Low-calorie sports drinks.  Avoid: ? Alcohol. ? Drinks that have a lot of sugar. These include high-calorie sports drinks, fruit juice that does not have water added, and soda. ? Caffeine. ? Foods that are greasy or have a lot of fat or sugar.  Take over-the-counter and prescription medicines only as told by your doctor.  Do not take salt tablets. Doing that can make the salt level in your body get too high (hypernatremia).  Eat foods that have minerals (electrolytes). Examples include bananas, oranges, potatoes, tomatoes, and spinach.  Keep all follow-up visits as told by your doctor. This is important. Contact a doctor if:  You have belly (abdominal) pain that: ? Gets worse. ? Stays in one area (localizes).  You have a rash.  You have a stiff neck.  You get angry or annoyed more easily than normal (irritability).  You are more sleepy than normal.  You have a harder time waking up than normal.  You feel: ? Weak. ? Dizzy. ? Very thirsty.  You have peed (urinated) only a small amount of very dark pee during 6-8 hours. Get help right away if:  You have   symptoms of very bad dehydration.  You cannot drink fluids without throwing up (vomiting).  Your symptoms get worse with treatment.  You have a fever.  You have a very bad headache.  You are throwing up or having watery poop (diarrhea) and it: ? Gets worse. ? Does not go away.  You have blood or something green (bile) in your throw-up.  You have blood in your poop (stool). This may cause poop to look black and tarry.  You have not peed in 6-8 hours.  You pass out (faint).  Your heart rate when you are sitting still is more than 100 beats a  minute.  You have trouble breathing. This information is not intended to replace advice given to you by your health care provider. Make sure you discuss any questions you have with your health care provider. Document Released: 05/14/2009 Document Revised: 06/30/2017 Document Reviewed: 09/11/2015 Elsevier Patient Education  2020 [employer]  

## 2019-02-12 ENCOUNTER — Other Ambulatory Visit: Payer: Self-pay

## 2019-02-12 ENCOUNTER — Inpatient Hospital Stay: Payer: Medicare HMO

## 2019-02-12 VITALS — BP 117/75 | HR 104 | Temp 98.3°F | Resp 18

## 2019-02-12 DIAGNOSIS — K118 Other diseases of salivary glands: Secondary | ICD-10-CM | POA: Diagnosis not present

## 2019-02-12 DIAGNOSIS — C321 Malignant neoplasm of supraglottis: Secondary | ICD-10-CM

## 2019-02-12 DIAGNOSIS — E876 Hypokalemia: Secondary | ICD-10-CM | POA: Diagnosis not present

## 2019-02-12 DIAGNOSIS — T451X5A Adverse effect of antineoplastic and immunosuppressive drugs, initial encounter: Secondary | ICD-10-CM | POA: Diagnosis not present

## 2019-02-12 DIAGNOSIS — Z95828 Presence of other vascular implants and grafts: Secondary | ICD-10-CM

## 2019-02-12 DIAGNOSIS — B370 Candidal stomatitis: Secondary | ICD-10-CM | POA: Diagnosis not present

## 2019-02-12 DIAGNOSIS — D72819 Decreased white blood cell count, unspecified: Secondary | ICD-10-CM | POA: Diagnosis not present

## 2019-02-12 DIAGNOSIS — D709 Neutropenia, unspecified: Secondary | ICD-10-CM | POA: Diagnosis not present

## 2019-02-12 DIAGNOSIS — E46 Unspecified protein-calorie malnutrition: Secondary | ICD-10-CM | POA: Diagnosis not present

## 2019-02-12 MED ORDER — SODIUM CHLORIDE 0.9 % IV SOLN
Freq: Once | INTRAVENOUS | Status: AC
  Administered 2019-02-12: 08:00:00 via INTRAVENOUS
  Filled 2019-02-12: qty 250

## 2019-02-12 MED ORDER — SODIUM CHLORIDE 0.9% FLUSH
10.0000 mL | Freq: Once | INTRAVENOUS | Status: AC | PRN
  Administered 2019-02-12: 10 mL
  Filled 2019-02-12: qty 10

## 2019-02-12 MED ORDER — HEPARIN SOD (PORK) LOCK FLUSH 100 UNIT/ML IV SOLN
500.0000 [IU] | Freq: Once | INTRAVENOUS | Status: AC | PRN
  Administered 2019-02-12: 500 [IU]
  Filled 2019-02-12: qty 5

## 2019-02-12 MED ORDER — ONDANSETRON HCL 4 MG/2ML IJ SOLN
INTRAMUSCULAR | Status: AC
  Filled 2019-02-12: qty 4

## 2019-02-12 MED ORDER — SODIUM CHLORIDE 0.9 % IV SOLN: Freq: Once | INTRAVENOUS | Status: DC

## 2019-02-12 MED ORDER — ONDANSETRON HCL 4 MG/2ML IJ SOLN
8.0000 mg | Freq: Once | INTRAMUSCULAR | Status: AC
  Administered 2019-02-12: 8 mg via INTRAVENOUS

## 2019-02-12 NOTE — Progress Notes (Signed)
[facility] OFFICE PROGRESS NOTE  Patient Care Team: Jearld Fenton, NP as PCP - General (Internal Medicine) Irene Shipper, MD as Consulting Physician (Gastroenterology) Early, Arvilla Meres, MD as Consulting Physician (Vascular Surgery) Serafina Mitchell, MD as Consulting Physician (Vascular Surgery) Eppie Gibson, MD as Attending Physician (Radiation Oncology) Leota Sauers, RN as Oncology Nurse Navigator (Oncology) Karie Mainland, RD as Dietitian (Nutrition) Sharen Counter, CCC-SLP as Speech Language Pathologist (Speech Pathology) Kennith Center, LCSW as Social Worker  HEME/ONC OVERVIEW: 1. 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 R Level II LN 5 x 3.3cm; L Level II LN 5.4 x 2.7cm w/ suspicion for ECE) -10/2018: DL with bx by Dr. Redmond Baseman showed a mass involving the left aryepiglottic fold extending up to the epiglottic and BOT, bx showed invasive squamous cell carcinoma; PET showed FDG-avid supraglottic malignancy and bilateral bulky FDG-avid Level II and III cervical LN's, no mets  -Late 10/2018 - 01/2019: definitive chemoradiation with weekly carboplatin    2. 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 -S/p definitive chemoRT with weekly carboplatin x 5 dose; treatment complicated by prolonged cytopenias and poor tolerance  -Patient declined feeding tube placement due to lack of social support  -On exam, she still has somewhat firm bilateral cervical adenopathy (R ~2-3cm, L ~1cm) -Therefore, we will plan to obtain CT neck and chest in ~6-8 weeks (ie late 03/2019) after completing treatment to monitor for any  persistent disease or progression  -PRN anti-emetics: Zofran, Compazine, Ativan and dexamethasone   Chemotherapy-associated leukopenia -Secondary to chemotherapy -WBC 2.9k with ANC 2200, improving  -Patient denies any symptoms of infection -We will monitor it for now    Chemotherapy-associated anemia -Secondary to chemotherapy -Hgb 11.0, stable  -Patient denies any symptom of bleeding -We will monitor for now  Chemotherapy-associated thrombocytopenia -Secondary to chemotherapy -Plts 116k, lower than last week  -Patient denies any symptoms of bleeding or excess bruising, such as epistaxis, hematochezia, melena, or hematuria -We will monitor for now  Hypomagnesemia -Secondary to electrolyte wasting from chemotherapy -Mg 1.5 today, persistently low  -We will administer 4g IV Mg today  -Continue oral magnesium supplement    Hypokalemia -Secondary to electrolyte wasting from chemotherapy -K 3.2 today, persistently low  -We will administer 82mEq IV KCl today  -Continue liquid KCl 63mEq daily; if the patient cannot tolerate the liquid, we can try tablets instead   Oral candidiasis -On fluconazole since 02/06/2019, plan for 3 weeks  -Overall improving -I counseled the patient on the importance of maintaining proper oral hygiene  Thick secretions -Secondary to radiation damage to the salivary glands -Salt/soda rinses 3-4x/day -I have counseled the patient on several occasions about increasing the frequency of salt/soda rinses, which would help with the thick secretion, patient still has not yet done so -I recommended the patient began today to increase the frequency of salt/soda rinses  Protein malnutrition -Secondary to chemoradiation -Weight  down by 2 pounds since thethe last visit -No feeding tube due to lack of social support and patient preference  -Due to still limited oral intake, we will set up the patient for supportive hydration (1L NS) for the remainder of  the  week -I encouraged the patient to maintain adequate PO intake as tolerated, and to adhere to nutritional recommendations   No orders of the defined types were placed in this encounter.   All questions were answered. The patient knows to call the clinic with any problems, questions or concerns. No barriers to learning was detected.  Return in 3 weeks for labs, port flush and clinic appt.   Tish Men, MD 02/13/2019 9:13 AM  CHIEF COMPLAINT: "I am about the same"  INTERVAL HISTORY: Ms. Olds returns to clinic for follow-up of squamous cell carcinoma of the supraglottic larynx s/p definitive chemoradiation.  Patient reports that she has been trying to increase her oral intake, including Jell-O, pudding, and sandwiches, as well as drinking water, tea, and sodas.  Her weight is down by 2 pounds since last visit.  She feels better since starting IV fluid hydration.  She reports compliance with her medications, including magnesium supplements, but cannot specify how often she is taking her medications, which raises some concern about her true compliance with her prescriptions.  She still has thick saliva, and is still only doing salt/soda rinses approximately 3-4 times a day.  She denies any significant nausea or vomiting.  She denies any other complaint today.  SUMMARY OF ONCOLOGIC HISTORY: Oncology History  Malignant neoplasm of supraglottis ([place])  10/26/2018 Imaging   CT neck w/ contrast: IMPRESSION: 1. Bulky, partially necrotic bilateral cervical lymphadenopathy most concerning for metastatic disease, less likely unusual infection. 2. Prominent soft tissue at the tongue base with asymmetric left aryepiglottic fold thickening and slight fullness of the left palatine tonsil. Direct visualization is recommended to assess for a primary mucosal malignancy. 3.  Aortic Atherosclerosis (ICD10-I70.0).   11/08/2018 Procedure   DL with biopsy   11/08/2018 Pathology Results   Accession: [id number] 3.  Larynx, biopsy, Left Supraglottis - INVASIVE SQUAMOUS CELL CARCINOMA. 2. Larynx, biopsy, Left Ary Epiglottic Fold - INVASIVE SQUAMOUS CELL CARCINOMA. 3. Tongue, biopsy, Left Vallecular Base - INVASIVE SQUAMOUS CELL CARCINOMA. 4. Larynx, biopsy, Right Vallecular Epiglottis - INVASIVE SQUAMOUS CELL CARCINOMA.   11/16/2018 Initial Diagnosis   Laryngeal cancer ([place])   11/23/2018 Imaging   PET: IMPRESSION: 1. Intensely hypermetabolic supraglottic mass predominantly involving the LEFT aryepiglottic fold but extending across midline anteriorly. Inferiorly hypermetabolic activity extends to the level of the larynx on the LEFT. 2. Bilateral bulky intensely hypermetabolic metastatic level II lymph nodes. Metastatic adenopathy extends with smaller nodes in the level III nodal stations bilaterally. 3. No evidence of thoracic metastasis. 4. Enlarged LEFT adrenal gland consistent with hyperplasia or lipid poor adenoma.   11/27/2018 Cancer Staging   Staging form: Larynx - Supraglottis, AJCC 8th Edition - Clinical stage from 11/27/2018: Stage IVB (cT2, cN3b, cM0) - Signed by Eppie Gibson, MD on 11/27/2018   12/13/2018 - 01/29/2019 Chemotherapy   The patient had palonosetron (ALOXI) injection 0.25 mg, 0.25 mg, Intravenous,  Once, 5 of 7 cycles Administration: 0.25 mg (12/13/2018), 0.25 mg (12/20/2018), 0.25 mg (12/27/2018), 0.25 mg (01/03/2019), 0.25 mg (01/10/2019) CARBOplatin (PARAPLATIN) 210 mg in sodium chloride 0.9 % 250 mL chemo infusion, 210 mg (100 % of original dose 213.8 mg), Intravenous,  Once, 5 of 7 cycles Dose modification:   (original dose 213.8 mg, Cycle 1) Administration: 210 mg (12/13/2018), 210 mg (12/20/2018), 210 mg (12/27/2018), 210 mg (01/03/2019), 210 mg (01/10/2019)  for chemotherapy treatment.      REVIEW OF SYSTEMS:   Constitutional: ( - ) fevers, ( - )  chills , ( - )  night sweats Eyes: ( - ) blurriness of vision, ( - ) double vision, ( - ) watery eyes Ears, nose, mouth, throat,  and face: ( - ) mucositis, ( - ) sore throat Respiratory: ( - ) cough, ( - ) dyspnea, ( - ) wheezes Cardiovascular: ( - ) palpitation, ( - ) chest discomfort, ( - ) lower extremity swelling Gastrointestinal:  ( - ) nausea, ( - ) heartburn, ( - ) change in bowel habits Skin: ( - ) abnormal skin rashes Lymphatics: ( - ) new lymphadenopathy, ( - ) easy bruising Neurological: ( - ) numbness, ( - ) tingling, ( - ) new weaknesses Behavioral/Psych: ( - ) mood change, ( - ) new changes  All other systems were reviewed with the patient and are negative.  I have reviewed the past medical history, past surgical history, social history and family history with the patient and they are unchanged from previous note.  ALLERGIES:  is allergic to propoxyphene hcl and propoxyphene n-acetaminophen.  MEDICATIONS:  Current Outpatient Medications  Medication Sig Dispense Refill  . Ascorbic Acid (VITAMIN C PO) Take 1 tablet by mouth daily.    Marland Kitchen aspirin EC 81 MG tablet Take 81 mg by mouth daily.    Marland Kitchen CALCIUM PO Take 1 tablet by mouth daily.    . Cholecalciferol (VITAMIN D3 PO) Take 1 tablet by mouth daily.    . clopidogrel (PLAVIX) 75 MG tablet TAKE 1 TABLET (75 MG TOTAL) BY MOUTH DAILY. 90 tablet 3  . fexofenadine (ALLEGRA) 180 MG tablet Take 180 mg by mouth at bedtime.     . fluconazole (DIFLUCAN) 100 MG tablet Take 2 tabs on the first day, and then 1 tab daily until finished 22 tablet 0  . fluticasone (FLONASE) 50 MCG/ACT nasal spray Place 1 spray into both nostrils daily.     . Garlic 8250 MG CAPS Take 1 capsule by mouth 2 (two) times daily.      . hydrochlorothiazide (MICROZIDE) 12.5 MG capsule TAKE 1 CAPSULE EVERY DAY 90 capsule 0  . levothyroxine (SYNTHROID) 112 MCG tablet TAKE 1 TABLET EVERY DAY 90 tablet 0  . lidocaine (XYLOCAINE) 2 % solution Patient: Mix 1part 2% viscous lidocaine, 1part H20. Swish & swallow 51mL of diluted mixture, 44min before meals and at bedtime, up to QID 100 mL 5  .  lidocaine-prilocaine (EMLA) cream Apply to affected area once 30 g 3  . LORazepam (ATIVAN) 0.5 MG tablet Take 1 tablet (0.5 mg total) by mouth every 6 (six) hours as needed (Nausea or vomiting). 30 tablet 0  . metoprolol succinate (TOPROL-XL) 25 MG 24 hr tablet TAKE 1 TABLET EVERY DAY 90 tablet 0  . Omega-3 Fatty Acids (FISH OIL) 1000 MG CAPS Take 1 capsule (1,000 mg total) by mouth daily.    . ondansetron (ZOFRAN) 8 MG tablet Take 1 tablet (8 mg total) by mouth 2 (two) times daily as needed for refractory nausea / vomiting. Start on day 3 after chemotherapy. 30 tablet 1  . potassium chloride 20 MEQ/15ML (10%) SOLN Take 15 mLs (20 mEq total) by mouth daily. 473 mL 2  . prochlorperazine (COMPAZINE) 10 MG tablet Take 1 tablet (10 mg total) by mouth every 6 (six) hours as needed (Nausea or vomiting). 30 tablet 1  . simvastatin (ZOCOR) 40 MG tablet TAKE 1 TABLET AT BEDTIME 90 tablet 0  . sodium fluoride (PREVIDENT 5000 PLUS) 1.1 % CREA dental cream Apply cream to tooth brush. Brush teeth for 2 minutes.  Spit out excess. DO NOT rinse afterwards. Repeat nightly. 1 Tube prn  . traMADol (ULTRAM) 50 MG tablet Take 1 tablet (50 mg total) by mouth every 8 (eight) hours as needed. Mail to the patient please. Patient has [employer] of $700.00. 60 tablet 1  . VITAMIN E PO Take 1 capsule by mouth daily.     No current facility-administered medications for this visit.     PHYSICAL EXAMINATION: ECOG PERFORMANCE STATUS: 2 - Symptomatic, <50% confined to bed  Today's Vitals   02/13/19 0848 02/13/19 0850  BP: 107/77   Pulse: (!) 108   Resp: 18   Temp: 98 F (36.7 C)   TempSrc: Temporal   SpO2: 100%   Weight: 175 lb 8 oz (79.6 kg)   Height: 5\' 5"  (1.651 m)   PainSc:  0-No pain   Body mass index is 29.2 kg/m.  Filed Weights   02/13/19 0848  Weight: 175 lb 8 oz (79.6 kg)    GENERAL: alert, no distress and comfortable, frail appearing sitting in a wheelchair  SKIN: skin color, texture, turgor are  normal, no rashes or significant lesions EYES: conjunctiva are pink and non-injected, sclera clear OROPHARYNX: no exudate, no erythema; lips, buccal mucosa, and tongue normal  NECK: supple, non-tender LYMPH:  Right cervical adenopathy improving but still measures ~2-3cm, left cervical LN palpable ~1cm  LUNGS: clear to auscultation with normal breathing effort HEART: regular rate & rhythm and no murmurs and no lower extremity edema ABDOMEN: soft, non-tender, non-distended, normal bowel sounds Musculoskeletal: no cyanosis of digits and no clubbing  PSYCH: alert & oriented x 3, slightly mumbled speech  LABORATORY DATA:  I have reviewed the data as listed    Component Value Date/Time   NA 144 02/13/2019 0757   K 3.2 (L) 02/13/2019 0757   CL 99 02/13/2019 0757   CO2 30 02/13/2019 0757   GLUCOSE 104 (H) 02/13/2019 0757   BUN 13 02/13/2019 0757   CREATININE 0.70 02/13/2019 0757   CREATININE 0.89 10/06/2017 1504   CALCIUM 9.2 02/13/2019 0757   PROT 7.2 11/21/2018 1251   ALBUMIN 4.1 11/21/2018 1251   AST 15 11/21/2018 1251   ALT 17 11/21/2018 1251   ALKPHOS 67 11/21/2018 1251   BILITOT 0.4 11/21/2018 1251   GFRNONAA >60 02/13/2019 0757   GFRAA >60 02/13/2019 0757    No results found for: SPEP, UPEP  Lab Results  Component Value Date   WBC 2.9 (L) 02/13/2019   NEUTROABS 2.2 02/13/2019   HGB 11.0 (L) 02/13/2019   HCT 32.4 (L) 02/13/2019   MCV 92.8 02/13/2019   PLT 116 (L) 02/13/2019      Chemistry      Component Value Date/Time   NA 144 02/13/2019 0757   K 3.2 (L) 02/13/2019 0757   CL 99 02/13/2019 0757   CO2 30 02/13/2019 0757   BUN 13 02/13/2019 0757   CREATININE 0.70 02/13/2019 0757   CREATININE 0.89 10/06/2017 1504      Component Value Date/Time   CALCIUM 9.2 02/13/2019 0757   ALKPHOS 67 11/21/2018 1251   AST 15 11/21/2018 1251   ALT 17 11/21/2018 1251   BILITOT 0.4 11/21/2018 1251

## 2019-02-12 NOTE — Patient Instructions (Signed)
Dehydration, Adult  Dehydration is when there is not enough fluid or water in your body. This happens when you lose more fluids than you take in. Dehydration can range from mild to very bad. It should be treated right away to keep it from getting very bad. Symptoms of mild dehydration may include:  Thirst.  Dry lips.  Slightly dry mouth.  Dry, warm skin.  Dizziness. Symptoms of moderate dehydration may include:  Very dry mouth.  Muscle cramps.  Dark pee (urine). Pee may be the color of tea.  Your body making less pee.  Your eyes making fewer tears.  Heartbeat that is uneven or faster than normal (palpitations).  Headache.  Light-headedness, especially when you stand up from sitting.  Fainting (syncope). Symptoms of very bad dehydration may include:  Changes in skin, such as: ? Cold and clammy skin. ? Blotchy (mottled) or pale skin. ? Skin that does not quickly return to normal after being lightly pinched and let go (poor skin turgor).  Changes in body fluids, such as: ? Feeling very thirsty. ? Your eyes making fewer tears. ? Not sweating when body temperature is high, such as in hot weather. ? Your body making very little pee.  Changes in vital signs, such as: ? Weak pulse. ? Pulse that is more than 100 beats a minute when you are sitting still. ? Fast breathing. ? Low blood pressure.  Other changes, such as: ? Sunken eyes. ? Cold hands and feet. ? Confusion. ? Lack of energy (lethargy). ? Trouble waking up from sleep. ? Short-term weight loss. ? Unconsciousness. Follow these instructions at home:   If told by your doctor, drink an ORS: ? Make an ORS by using instructions on the package. ? Start by drinking small amounts, about  cup (120 mL) every 5-10 minutes. ? Slowly drink more until you have had the amount that your doctor said to have.  Drink enough clear fluid to keep your pee clear or pale yellow. If you were told to drink an ORS, finish the  ORS first, then start slowly drinking clear fluids. Drink fluids such as: ? Water. Do not drink only water by itself. Doing that can make the salt (sodium) level in your body get too low (hyponatremia). ? Ice chips. ? Fruit juice that you have added water to (diluted). ? Low-calorie sports drinks.  Avoid: ? Alcohol. ? Drinks that have a lot of sugar. These include high-calorie sports drinks, fruit juice that does not have water added, and soda. ? Caffeine. ? Foods that are greasy or have a lot of fat or sugar.  Take over-the-counter and prescription medicines only as told by your doctor.  Do not take salt tablets. Doing that can make the salt level in your body get too high (hypernatremia).  Eat foods that have minerals (electrolytes). Examples include bananas, oranges, potatoes, tomatoes, and spinach.  Keep all follow-up visits as told by your doctor. This is important. Contact a doctor if:  You have belly (abdominal) pain that: ? Gets worse. ? Stays in one area (localizes).  You have a rash.  You have a stiff neck.  You get angry or annoyed more easily than normal (irritability).  You are more sleepy than normal.  You have a harder time waking up than normal.  You feel: ? Weak. ? Dizzy. ? Very thirsty.  You have peed (urinated) only a small amount of very dark pee during 6-8 hours. Get help right away if:  You have   symptoms of very bad dehydration.  You cannot drink fluids without throwing up (vomiting).  Your symptoms get worse with treatment.  You have a fever.  You have a very bad headache.  You are throwing up or having watery poop (diarrhea) and it: ? Gets worse. ? Does not go away.  You have blood or something green (bile) in your throw-up.  You have blood in your poop (stool). This may cause poop to look black and tarry.  You have not peed in 6-8 hours.  You pass out (faint).  Your heart rate when you are sitting still is more than 100 beats a  minute.  You have trouble breathing. This information is not intended to replace advice given to you by your health care provider. Make sure you discuss any questions you have with your health care provider. Document Released: 05/14/2009 Document Revised: 06/30/2017 Document Reviewed: 09/11/2015 Elsevier Patient Education  2020 [employer]  

## 2019-02-13 ENCOUNTER — Inpatient Hospital Stay: Payer: Medicare HMO

## 2019-02-13 ENCOUNTER — Ambulatory Visit: Payer: Self-pay | Admitting: Radiation Oncology

## 2019-02-13 ENCOUNTER — Other Ambulatory Visit: Payer: Self-pay | Admitting: Radiation Oncology

## 2019-02-13 ENCOUNTER — Encounter: Payer: Self-pay | Admitting: Hematology

## 2019-02-13 ENCOUNTER — Other Ambulatory Visit: Payer: Self-pay

## 2019-02-13 ENCOUNTER — Inpatient Hospital Stay (HOSPITAL_BASED_OUTPATIENT_CLINIC_OR_DEPARTMENT_OTHER): Payer: Medicare HMO | Admitting: Hematology

## 2019-02-13 VITALS — BP 100/66 | HR 90 | Resp 17

## 2019-02-13 VITALS — BP 107/77 | HR 108 | Temp 98.0°F | Resp 18 | Ht 65.0 in | Wt 175.5 lb

## 2019-02-13 DIAGNOSIS — C321 Malignant neoplasm of supraglottis: Secondary | ICD-10-CM

## 2019-02-13 DIAGNOSIS — D6481 Anemia due to antineoplastic chemotherapy: Secondary | ICD-10-CM

## 2019-02-13 DIAGNOSIS — T451X5A Adverse effect of antineoplastic and immunosuppressive drugs, initial encounter: Secondary | ICD-10-CM

## 2019-02-13 DIAGNOSIS — R859 Unspecified abnormal finding in specimens from digestive organs and abdominal cavity: Secondary | ICD-10-CM

## 2019-02-13 DIAGNOSIS — B370 Candidal stomatitis: Secondary | ICD-10-CM | POA: Diagnosis not present

## 2019-02-13 DIAGNOSIS — E876 Hypokalemia: Secondary | ICD-10-CM

## 2019-02-13 DIAGNOSIS — R110 Nausea: Secondary | ICD-10-CM

## 2019-02-13 DIAGNOSIS — K118 Other diseases of salivary glands: Secondary | ICD-10-CM | POA: Diagnosis not present

## 2019-02-13 DIAGNOSIS — D6959 Other secondary thrombocytopenia: Secondary | ICD-10-CM

## 2019-02-13 DIAGNOSIS — Z95828 Presence of other vascular implants and grafts: Secondary | ICD-10-CM

## 2019-02-13 DIAGNOSIS — Z79899 Other long term (current) drug therapy: Secondary | ICD-10-CM

## 2019-02-13 DIAGNOSIS — Y842 Radiological procedure and radiotherapy as the cause of abnormal reaction of the patient, or of later complication, without mention of misadventure at the time of the procedure: Secondary | ICD-10-CM | POA: Diagnosis not present

## 2019-02-13 DIAGNOSIS — D709 Neutropenia, unspecified: Secondary | ICD-10-CM

## 2019-02-13 DIAGNOSIS — I700 Atherosclerosis of aorta: Secondary | ICD-10-CM

## 2019-02-13 DIAGNOSIS — D72819 Decreased white blood cell count, unspecified: Secondary | ICD-10-CM

## 2019-02-13 DIAGNOSIS — E46 Unspecified protein-calorie malnutrition: Secondary | ICD-10-CM | POA: Diagnosis not present

## 2019-02-13 DIAGNOSIS — D701 Agranulocytosis secondary to cancer chemotherapy: Secondary | ICD-10-CM

## 2019-02-13 DIAGNOSIS — R599 Enlarged lymph nodes, unspecified: Secondary | ICD-10-CM

## 2019-02-13 DIAGNOSIS — Z7982 Long term (current) use of aspirin: Secondary | ICD-10-CM

## 2019-02-13 LAB — MAGNESIUM: Magnesium: 1.5 mg/dL — ABNORMAL LOW (ref 1.7–2.4)

## 2019-02-13 LAB — CBC WITH DIFFERENTIAL (CANCER CENTER ONLY)
Abs Immature Granulocytes: 0.12 10*3/uL — ABNORMAL HIGH (ref 0.00–0.07)
Basophils Absolute: 0 10*3/uL (ref 0.0–0.1)
Basophils Relative: 1 %
Eosinophils Absolute: 0 10*3/uL (ref 0.0–0.5)
Eosinophils Relative: 0 %
HCT: 32.4 % — ABNORMAL LOW (ref 36.0–46.0)
Hemoglobin: 11 g/dL — ABNORMAL LOW (ref 12.0–15.0)
Immature Granulocytes: 4 %
Lymphocytes Relative: 8 %
Lymphs Abs: 0.2 10*3/uL — ABNORMAL LOW (ref 0.7–4.0)
MCH: 31.5 pg (ref 26.0–34.0)
MCHC: 34 g/dL (ref 30.0–36.0)
MCV: 92.8 fL (ref 80.0–100.0)
Monocytes Absolute: 0.3 10*3/uL (ref 0.1–1.0)
Monocytes Relative: 10 %
Neutro Abs: 2.2 10*3/uL (ref 1.7–7.7)
Neutrophils Relative %: 77 %
Platelet Count: 116 10*3/uL — ABNORMAL LOW (ref 150–400)
RBC: 3.49 MIL/uL — ABNORMAL LOW (ref 3.87–5.11)
RDW: 15.4 % (ref 11.5–15.5)
WBC Count: 2.9 10*3/uL — ABNORMAL LOW (ref 4.0–10.5)
nRBC: 0 % (ref 0.0–0.2)

## 2019-02-13 LAB — BASIC METABOLIC PANEL - CANCER CENTER ONLY
Anion gap: 15 (ref 5–15)
BUN: 13 mg/dL (ref 8–23)
CO2: 30 mmol/L (ref 22–32)
Calcium: 9.2 mg/dL (ref 8.9–10.3)
Chloride: 99 mmol/L (ref 98–111)
Creatinine: 0.7 mg/dL (ref 0.44–1.00)
GFR, Est AFR Am: >60 mL/min (ref >60)
GFR, Estimated: >60 mL/min (ref >60)
Glucose, Bld: 104 mg/dL — ABNORMAL HIGH (ref 70–99)
Potassium: 3.2 mmol/L — ABNORMAL LOW (ref 3.5–5.1)
Sodium: 144 mmol/L (ref 135–145)

## 2019-02-13 MED ORDER — HEPARIN SOD (PORK) LOCK FLUSH 100 UNIT/ML IV SOLN
500.0000 [IU] | Freq: Once | INTRAVENOUS | Status: AC | PRN
  Administered 2019-02-13: 500 [IU]
  Filled 2019-02-13: qty 5

## 2019-02-13 MED ORDER — SODIUM CHLORIDE 0.9 % IV SOLN: Freq: Once | INTRAVENOUS | Status: DC

## 2019-02-13 MED ORDER — SODIUM CHLORIDE 0.9 % IV SOLN
Freq: Once | INTRAVENOUS | Status: AC
  Administered 2019-02-13: 09:00:00 via INTRAVENOUS
  Filled 2019-02-13: qty 250

## 2019-02-13 MED ORDER — ONDANSETRON HCL 4 MG/2ML IJ SOLN
8.0000 mg | Freq: Once | INTRAMUSCULAR | Status: AC
  Administered 2019-02-13: 09:00:00 8 mg via INTRAVENOUS

## 2019-02-13 MED ORDER — SODIUM CHLORIDE 0.9% FLUSH
10.0000 mL | Freq: Once | INTRAVENOUS | Status: AC | PRN
  Administered 2019-02-13: 10 mL
  Filled 2019-02-13: qty 10

## 2019-02-13 MED ORDER — SODIUM CHLORIDE 0.9% FLUSH
10.0000 mL | Freq: Once | INTRAVENOUS | Status: AC
  Administered 2019-02-13: 10 mL
  Filled 2019-02-13: qty 10

## 2019-02-13 MED ORDER — ONDANSETRON HCL 4 MG/2ML IJ SOLN
INTRAMUSCULAR | Status: AC
  Filled 2019-02-13: qty 2

## 2019-02-13 MED ORDER — SODIUM CHLORIDE 0.9 % IV SOLN
Freq: Once | INTRAVENOUS | Status: AC
  Administered 2019-02-13: 10:00:00 via INTRAVENOUS
  Filled 2019-02-13: qty 1000

## 2019-02-13 NOTE — Progress Notes (Signed)
I called the patient today about their upcoming follow-up appointment in radiation oncology.   Given concerns about the COVID-19 pandemic, I offered a phone assessment with the patient to determine if coming to the clinic was necessary. The patient accepted.  I let the patient know that I had spoken with Dr. Isidore Moos, and she wanted them to know the importance of washing their hands for at least 20 seconds at a time, especially after going out in public, and before they eat. Limit going out in public whenever possible. Do not touch your face, unless your hands are clean, such as when bathing. Get plenty of rest, eat well, and stay hydrated.   Symptomatically, the patient is doing relatively well. They report feeling better. She saw Dr. Maylon Peppers today and received IVF.   All questions were answered to the patient's satisfaction.  I encouraged the patient to call with any further questions. Otherwise, the plan is follow up in 3 months with a restaging scan at the same time.  She will continue to see Dr. Maylon Peppers in medical oncology.   Patient is pleased with this plan, and we will cancel their upcoming follow-up to reduce the risk of COVID-19 transmission.

## 2019-02-13 NOTE — Patient Instructions (Signed)
Dehydration, Adult  Dehydration is when there is not enough fluid or water in your body. This happens when you lose more fluids than you take in. Dehydration can range from mild to very bad. It should be treated right away to keep it from getting very bad. Symptoms of mild dehydration may include:  Thirst.  Dry lips.  Slightly dry mouth.  Dry, warm skin.  Dizziness. Symptoms of moderate dehydration may include:  Very dry mouth.  Muscle cramps.  Dark pee (urine). Pee may be the color of tea.  Your body making less pee.  Your eyes making fewer tears.  Heartbeat that is uneven or faster than normal (palpitations).  Headache.  Light-headedness, especially when you stand up from sitting.  Fainting (syncope). Symptoms of very bad dehydration may include:  Changes in skin, such as: ? Cold and clammy skin. ? Blotchy (mottled) or pale skin. ? Skin that does not quickly return to normal after being lightly pinched and let go (poor skin turgor).  Changes in body fluids, such as: ? Feeling very thirsty. ? Your eyes making fewer tears. ? Not sweating when body temperature is high, such as in hot weather. ? Your body making very little pee.  Changes in vital signs, such as: ? Weak pulse. ? Pulse that is more than 100 beats a minute when you are sitting still. ? Fast breathing. ? Low blood pressure.  Other changes, such as: ? Sunken eyes. ? Cold hands and feet. ? Confusion. ? Lack of energy (lethargy). ? Trouble waking up from sleep. ? Short-term weight loss. ? Unconsciousness. Follow these instructions at home:   If told by your doctor, drink an ORS: ? Make an ORS by using instructions on the package. ? Start by drinking small amounts, about  cup (120 mL) every 5-10 minutes. ? Slowly drink more until you have had the amount that your doctor said to have.  Drink enough clear fluid to keep your pee clear or pale yellow. If you were told to drink an ORS, finish the  ORS first, then start slowly drinking clear fluids. Drink fluids such as: ? Water. Do not drink only water by itself. Doing that can make the salt (sodium) level in your body get too low (hyponatremia). ? Ice chips. ? Fruit juice that you have added water to (diluted). ? Low-calorie sports drinks.  Avoid: ? Alcohol. ? Drinks that have a lot of sugar. These include high-calorie sports drinks, fruit juice that does not have water added, and soda. ? Caffeine. ? Foods that are greasy or have a lot of fat or sugar.  Take over-the-counter and prescription medicines only as told by your doctor.  Do not take salt tablets. Doing that can make the salt level in your body get too high (hypernatremia).  Eat foods that have minerals (electrolytes). Examples include bananas, oranges, potatoes, tomatoes, and spinach.  Keep all follow-up visits as told by your doctor. This is important. Contact a doctor if:  You have belly (abdominal) pain that: ? Gets worse. ? Stays in one area (localizes).  You have a rash.  You have a stiff neck.  You get angry or annoyed more easily than normal (irritability).  You are more sleepy than normal.  You have a harder time waking up than normal.  You feel: ? Weak. ? Dizzy. ? Very thirsty.  You have peed (urinated) only a small amount of very dark pee during 6-8 hours. Get help right away if:  You have   symptoms of very bad dehydration.  You cannot drink fluids without throwing up (vomiting).  Your symptoms get worse with treatment.  You have a fever.  You have a very bad headache.  You are throwing up or having watery poop (diarrhea) and it: ? Gets worse. ? Does not go away.  You have blood or something green (bile) in your throw-up.  You have blood in your poop (stool). This may cause poop to look black and tarry.  You have not peed in 6-8 hours.  You pass out (faint).  Your heart rate when you are sitting still is more than 100 beats a  minute.  You have trouble breathing. This information is not intended to replace advice given to you by your health care provider. Make sure you discuss any questions you have with your health care provider. Document Released: 05/14/2009 Document Revised: 06/30/2017 Document Reviewed: 09/11/2015 Elsevier Patient Education  2020 [employer]  

## 2019-02-13 NOTE — Patient Instructions (Signed)

## 2019-02-15 ENCOUNTER — Ambulatory Visit
Admission: RE | Admit: 2019-02-15 | Discharge: 2019-02-15 | Disposition: A | Discharge status: Home / Self Care | Payer: Medicare HMO | Source: Ambulatory Visit | Attending: Radiation Oncology | Admitting: Radiation Oncology

## 2019-02-18 ENCOUNTER — Telehealth: Payer: Self-pay | Admitting: Internal Medicine

## 2019-02-18 ENCOUNTER — Inpatient Hospital Stay: Payer: Medicare HMO

## 2019-02-18 DIAGNOSIS — C321 Malignant neoplasm of supraglottis: Secondary | ICD-10-CM | POA: Diagnosis not present

## 2019-02-18 NOTE — Telephone Encounter (Signed)
I tried to call the Pt and there is no answer and I can not leave VM because there isnt an option

## 2019-02-18 NOTE — Progress Notes (Signed)
Oncology Nurse Navigator Documentation  In support of COVID-19 mitigation practices, Ms. Parlee participated in this morning's H&N [place] via telephone and received scheduled calls from Nutrition and SLP. She will be referred to PT post-RT for lymphedema evaluation/treatment as needed.  Gayleen Orem, RN, BSN Head & Neck Oncology Nurse [place] [facility] [place] [phone]

## 2019-02-19 ENCOUNTER — Inpatient Hospital Stay: Payer: Medicare HMO

## 2019-02-19 ENCOUNTER — Other Ambulatory Visit: Payer: Self-pay | Admitting: Hematology

## 2019-02-19 ENCOUNTER — Telehealth: Payer: Self-pay | Admitting: *Deleted

## 2019-02-19 DIAGNOSIS — E876 Hypokalemia: Secondary | ICD-10-CM

## 2019-02-19 MED ORDER — POTASSIUM CHLORIDE CRYS ER 20 MEQ PO TBCR: 20.0000 meq | EXTENDED_RELEASE_TABLET | Freq: Every day | ORAL | 3 refills | Status: AC

## 2019-02-19 NOTE — Telephone Encounter (Signed)
We can try the KCl tab instead. I prescribed the tablet before, but she couldn't swallow the pill due to nausea and pill size.  Her swallow function has somewhat improved, so we can try the tablet. I will send her the prescription.   Thanks.  Dr. Maylon Peppers

## 2019-02-19 NOTE — Telephone Encounter (Signed)
[employer] faxed prescription change form requesting Potassium Chloride 20 MEQ/15 ml oral liquid be switched to a cost saving alternative.     Alternatives:  Klor-Con M20 ER 20 meq  Potassium chloride ER available in 10 meq, 8 meq or 20 meq  Potassium Chloride ER tablet ER (part/Cryst) available as 10 meq or 20 meq Instructed to e-prescribe order to patient's preferred, [facility].

## 2019-02-19 NOTE — Progress Notes (Signed)
  Patient Name: Gloria Murray MRN: [id number] DOB: 1951/04/17 Referring Physician: Webb Silversmith (Profile Not Attached) Date of Service: 01/30/2019 [facility], [place]                                                        End Of Treatment Note  Diagnoses: C32.0-Malignant neoplasm of glottis C32.1-Malignant neoplasm of supraglottis  Cancer Staging Malignant neoplasm of supraglottis [facility]) Staging form: Larynx - Supraglottis, AJCC 8th Edition - Clinical stage from 11/27/2018: Stage IVB (cT2, cN3b, cM0) - Signed by Eppie Gibson, MD on 11/27/2018  Intent: Curative  Radiation Treatment Dates: 12/10/2018 through 01/30/2019 Site Technique Total Dose Dose per Fx Completed Fx Beam Energies  Head & neck: HN_larynx IMRT 70/70 2 35/35 6X   Narrative: The patient tolerated radiation therapy relatively well. She reported mild fatigue and experienced weight loss as a result of poor appetite. She denied any difficulty with swallowing or mouth ulcers. She did develop thick saliva. Her skin is erythematous with dry desquamation.  Plan: The patient will follow-up with radiation oncology in 2-3 weeks. She was advised to push nutrition and fluids orally. Referral placed to [employer] for [employer]. She has declined a feeding tube AMA.  ________________________________________________  Eppie Gibson, MD  This document serves as a record of services personally performed by Eppie Gibson, MD. It was created on her behalf by Rae Lips, a trained medical scribe. The creation of this record is based on the scribe's personal observations and the provider's statements to them. This document has been checked and approved by the attending provider.

## 2019-02-20 ENCOUNTER — Encounter: Payer: Medicare HMO | Admitting: Nutrition

## 2019-02-20 ENCOUNTER — Inpatient Hospital Stay (HOSPITAL_COMMUNITY)
Admission: EM | Admit: 2019-02-20 | Discharge: 2019-03-02 | DRG: 871 | Disposition: E | Payer: Medicare HMO | Attending: Internal Medicine | Admitting: Internal Medicine

## 2019-02-20 ENCOUNTER — Telehealth: Payer: Self-pay | Admitting: *Deleted

## 2019-02-20 ENCOUNTER — Telehealth (HOSPITAL_COMMUNITY): Payer: Self-pay

## 2019-02-20 ENCOUNTER — Encounter (HOSPITAL_COMMUNITY): Payer: Self-pay

## 2019-02-20 ENCOUNTER — Inpatient Hospital Stay: Payer: Medicare HMO

## 2019-02-20 ENCOUNTER — Other Ambulatory Visit: Payer: Self-pay

## 2019-02-20 ENCOUNTER — Emergency Department (HOSPITAL_COMMUNITY): Payer: Medicare HMO

## 2019-02-20 DIAGNOSIS — J690 Pneumonitis due to inhalation of food and vomit: Secondary | ICD-10-CM | POA: Diagnosis present

## 2019-02-20 DIAGNOSIS — Z9221 Personal history of antineoplastic chemotherapy: Secondary | ICD-10-CM

## 2019-02-20 DIAGNOSIS — Z20828 Contact with and (suspected) exposure to other viral communicable diseases: Secondary | ICD-10-CM | POA: Diagnosis present

## 2019-02-20 DIAGNOSIS — E870 Hyperosmolality and hypernatremia: Secondary | ICD-10-CM | POA: Diagnosis present

## 2019-02-20 DIAGNOSIS — Z66 Do not resuscitate: Secondary | ICD-10-CM | POA: Diagnosis not present

## 2019-02-20 DIAGNOSIS — E039 Hypothyroidism, unspecified: Secondary | ICD-10-CM | POA: Diagnosis not present

## 2019-02-20 DIAGNOSIS — S299XXA Unspecified injury of thorax, initial encounter: Secondary | ICD-10-CM | POA: Diagnosis not present

## 2019-02-20 DIAGNOSIS — C140 Malignant neoplasm of pharynx, unspecified: Secondary | ICD-10-CM | POA: Diagnosis present

## 2019-02-20 DIAGNOSIS — Z7189 Other specified counseling: Secondary | ICD-10-CM | POA: Diagnosis not present

## 2019-02-20 DIAGNOSIS — A419 Sepsis, unspecified organism: Secondary | ICD-10-CM | POA: Diagnosis not present

## 2019-02-20 DIAGNOSIS — E782 Mixed hyperlipidemia: Secondary | ICD-10-CM | POA: Diagnosis not present

## 2019-02-20 DIAGNOSIS — T17908A Unspecified foreign body in respiratory tract, part unspecified causing other injury, initial encounter: Secondary | ICD-10-CM | POA: Diagnosis not present

## 2019-02-20 DIAGNOSIS — Z923 Personal history of irradiation: Secondary | ICD-10-CM

## 2019-02-20 DIAGNOSIS — Y92009 Unspecified place in unspecified non-institutional (private) residence as the place of occurrence of the external cause: Secondary | ICD-10-CM | POA: Diagnosis not present

## 2019-02-20 DIAGNOSIS — Z7951 Long term (current) use of inhaled steroids: Secondary | ICD-10-CM | POA: Diagnosis not present

## 2019-02-20 DIAGNOSIS — R29898 Other symptoms and signs involving the musculoskeletal system: Secondary | ICD-10-CM | POA: Diagnosis not present

## 2019-02-20 DIAGNOSIS — R7881 Bacteremia: Secondary | ICD-10-CM

## 2019-02-20 DIAGNOSIS — Z79899 Other long term (current) drug therapy: Secondary | ICD-10-CM | POA: Diagnosis not present

## 2019-02-20 DIAGNOSIS — D701 Agranulocytosis secondary to cancer chemotherapy: Secondary | ICD-10-CM

## 2019-02-20 DIAGNOSIS — C321 Malignant neoplasm of supraglottis: Secondary | ICD-10-CM | POA: Diagnosis present

## 2019-02-20 DIAGNOSIS — G9340 Encephalopathy, unspecified: Secondary | ICD-10-CM | POA: Diagnosis not present

## 2019-02-20 DIAGNOSIS — E861 Hypovolemia: Secondary | ICD-10-CM | POA: Diagnosis not present

## 2019-02-20 DIAGNOSIS — D61810 Antineoplastic chemotherapy induced pancytopenia: Secondary | ICD-10-CM | POA: Diagnosis present

## 2019-02-20 DIAGNOSIS — D61818 Other pancytopenia: Secondary | ICD-10-CM

## 2019-02-20 DIAGNOSIS — Z515 Encounter for palliative care: Secondary | ICD-10-CM | POA: Diagnosis not present

## 2019-02-20 DIAGNOSIS — W1830XA Fall on same level, unspecified, initial encounter: Secondary | ICD-10-CM | POA: Diagnosis present

## 2019-02-20 DIAGNOSIS — Z452 Encounter for adjustment and management of vascular access device: Secondary | ICD-10-CM | POA: Diagnosis not present

## 2019-02-20 DIAGNOSIS — G9341 Metabolic encephalopathy: Secondary | ICD-10-CM | POA: Diagnosis present

## 2019-02-20 DIAGNOSIS — B370 Candidal stomatitis: Secondary | ICD-10-CM | POA: Diagnosis present

## 2019-02-20 DIAGNOSIS — E890 Postprocedural hypothyroidism: Secondary | ICD-10-CM | POA: Diagnosis present

## 2019-02-20 DIAGNOSIS — Z87891 Personal history of nicotine dependence: Secondary | ICD-10-CM | POA: Diagnosis not present

## 2019-02-20 DIAGNOSIS — Z0389 Encounter for observation for other suspected diseases and conditions ruled out: Secondary | ICD-10-CM | POA: Diagnosis not present

## 2019-02-20 DIAGNOSIS — H9192 Unspecified hearing loss, left ear: Secondary | ICD-10-CM | POA: Diagnosis present

## 2019-02-20 DIAGNOSIS — C760 Malignant neoplasm of head, face and neck: Secondary | ICD-10-CM | POA: Diagnosis not present

## 2019-02-20 DIAGNOSIS — R531 Weakness: Secondary | ICD-10-CM | POA: Diagnosis not present

## 2019-02-20 DIAGNOSIS — M810 Age-related osteoporosis without current pathological fracture: Secondary | ICD-10-CM | POA: Diagnosis present

## 2019-02-20 DIAGNOSIS — R410 Disorientation, unspecified: Secondary | ICD-10-CM | POA: Diagnosis not present

## 2019-02-20 DIAGNOSIS — R6521 Severe sepsis with septic shock: Secondary | ICD-10-CM | POA: Diagnosis present

## 2019-02-20 DIAGNOSIS — T68XXXA Hypothermia, initial encounter: Secondary | ICD-10-CM | POA: Diagnosis not present

## 2019-02-20 DIAGNOSIS — R5383 Other fatigue: Secondary | ICD-10-CM | POA: Diagnosis not present

## 2019-02-20 DIAGNOSIS — I959 Hypotension, unspecified: Secondary | ICD-10-CM | POA: Diagnosis not present

## 2019-02-20 DIAGNOSIS — E43 Unspecified severe protein-calorie malnutrition: Secondary | ICD-10-CM | POA: Diagnosis present

## 2019-02-20 DIAGNOSIS — Z7902 Long term (current) use of antithrombotics/antiplatelets: Secondary | ICD-10-CM

## 2019-02-20 DIAGNOSIS — Z431 Encounter for attention to gastrostomy: Secondary | ICD-10-CM

## 2019-02-20 DIAGNOSIS — E876 Hypokalemia: Secondary | ICD-10-CM | POA: Diagnosis present

## 2019-02-20 DIAGNOSIS — I9589 Other hypotension: Secondary | ICD-10-CM | POA: Diagnosis not present

## 2019-02-20 DIAGNOSIS — J189 Pneumonia, unspecified organism: Secondary | ICD-10-CM

## 2019-02-20 DIAGNOSIS — I10 Essential (primary) hypertension: Secondary | ICD-10-CM | POA: Diagnosis present

## 2019-02-20 DIAGNOSIS — Z79890 Hormone replacement therapy (postmenopausal): Secondary | ICD-10-CM

## 2019-02-20 DIAGNOSIS — J9601 Acute respiratory failure with hypoxia: Secondary | ICD-10-CM | POA: Diagnosis present

## 2019-02-20 DIAGNOSIS — A491 Streptococcal infection, unspecified site: Secondary | ICD-10-CM | POA: Diagnosis not present

## 2019-02-20 DIAGNOSIS — T451X5A Adverse effect of antineoplastic and immunosuppressive drugs, initial encounter: Secondary | ICD-10-CM

## 2019-02-20 DIAGNOSIS — A408 Other streptococcal sepsis: Principal | ICD-10-CM | POA: Diagnosis present

## 2019-02-20 DIAGNOSIS — Z6827 Body mass index (BMI) 27.0-27.9, adult: Secondary | ICD-10-CM

## 2019-02-20 DIAGNOSIS — Z7982 Long term (current) use of aspirin: Secondary | ICD-10-CM

## 2019-02-20 DIAGNOSIS — D703 Neutropenia due to infection: Secondary | ICD-10-CM | POA: Diagnosis present

## 2019-02-20 DIAGNOSIS — R1310 Dysphagia, unspecified: Secondary | ICD-10-CM | POA: Diagnosis not present

## 2019-02-20 DIAGNOSIS — J9811 Atelectasis: Secondary | ICD-10-CM | POA: Diagnosis not present

## 2019-02-20 LAB — COMPREHENSIVE METABOLIC PANEL
ALT: 29 U/L (ref 0–44)
AST: 21 U/L (ref 15–41)
Albumin: 3.8 g/dL (ref 3.5–5.0)
Alkaline Phosphatase: 45 U/L (ref 38–126)
Anion gap: 16 — ABNORMAL HIGH (ref 5–15)
BUN: 44 mg/dL — ABNORMAL HIGH (ref 8–23)
CO2: 31 mmol/L (ref 22–32)
Calcium: 9.9 mg/dL (ref 8.9–10.3)
Chloride: 99 mmol/L (ref 98–111)
Creatinine, Ser: 0.85 mg/dL (ref 0.44–1.00)
GFR calc Af Amer: >60 mL/min (ref >60)
GFR calc non Af Amer: >60 mL/min (ref >60)
Glucose, Bld: 120 mg/dL — ABNORMAL HIGH (ref 70–99)
Potassium: 2.9 mmol/L — ABNORMAL LOW (ref 3.5–5.1)
Sodium: 146 mmol/L — ABNORMAL HIGH (ref 135–145)
Total Bilirubin: 1.5 mg/dL — ABNORMAL HIGH (ref 0.3–1.2)
Total Protein: 6.5 g/dL (ref 6.5–8.1)

## 2019-02-20 LAB — CBC WITH DIFFERENTIAL/PLATELET
Abs Immature Granulocytes: 0.02 10*3/uL (ref 0.00–0.07)
Basophils Absolute: 0 10*3/uL (ref 0.0–0.1)
Basophils Relative: 0 %
Eosinophils Absolute: 0 10*3/uL (ref 0.0–0.5)
Eosinophils Relative: 0 %
HCT: 30.3 % — ABNORMAL LOW (ref 36.0–46.0)
Hemoglobin: 10.5 g/dL — ABNORMAL LOW (ref 12.0–15.0)
Immature Granulocytes: 1 %
Lymphocytes Relative: 4 %
Lymphs Abs: 0.1 10*3/uL — ABNORMAL LOW (ref 0.7–4.0)
MCH: 32 pg (ref 26.0–34.0)
MCHC: 34.7 g/dL (ref 30.0–36.0)
MCV: 92.4 fL (ref 80.0–100.0)
Monocytes Absolute: 0.2 10*3/uL (ref 0.1–1.0)
Monocytes Relative: 9 %
Neutro Abs: 2.5 10*3/uL (ref 1.7–7.7)
Neutrophils Relative %: 86 %
Platelets: 79 10*3/uL — ABNORMAL LOW (ref 150–400)
RBC: 3.28 MIL/uL — ABNORMAL LOW (ref 3.87–5.11)
RDW: 15.1 % (ref 11.5–15.5)
WBC Morphology: INCREASED
WBC: 2.8 10*3/uL — ABNORMAL LOW (ref 4.0–10.5)
nRBC: 0.7 % — ABNORMAL HIGH (ref 0.0–0.2)

## 2019-02-20 LAB — URINALYSIS, ROUTINE W REFLEX MICROSCOPIC
Glucose, UA: NEGATIVE mg/dL
Hgb urine dipstick: NEGATIVE
Ketones, ur: 5 mg/dL — AB
Leukocytes,Ua: NEGATIVE
Nitrite: NEGATIVE
Protein, ur: NEGATIVE mg/dL
Specific Gravity, Urine: 1.02 (ref 1.005–1.030)
pH: 5 (ref 5.0–8.0)

## 2019-02-20 LAB — LACTIC ACID, PLASMA
Lactic Acid, Venous: 1.3 mmol/L (ref 0.5–1.9)
Lactic Acid, Venous: 1 mmol/L (ref 0.5–1.9)

## 2019-02-20 LAB — SARS CORONAVIRUS 2 BY RT PCR (HOSPITAL ORDER, PERFORMED IN ~~LOC~~ HOSPITAL LAB): SARS Coronavirus 2: NEGATIVE

## 2019-02-20 LAB — MAGNESIUM: Magnesium: 2.1 mg/dL (ref 1.7–2.4)

## 2019-02-20 MED ORDER — POTASSIUM CHLORIDE 20 MEQ/15ML (10%) PO SOLN: 40.0000 meq | Freq: Once | ORAL | Status: DC

## 2019-02-20 MED ORDER — VANCOMYCIN HCL 10 G IV SOLR
1750.0000 mg | Freq: Once | INTRAVENOUS | Status: AC
  Administered 2019-02-21: 04:00:00 1750 mg via INTRAVENOUS
  Filled 2019-02-20: qty 1750

## 2019-02-20 MED ORDER — ASPIRIN EC 81 MG PO TBEC: 81.0000 mg | DELAYED_RELEASE_TABLET | Freq: Every day | ORAL | Status: DC

## 2019-02-20 MED ORDER — SODIUM CHLORIDE 0.9 % IV SOLN
2.0000 g | Freq: Three times a day (TID) | INTRAVENOUS | Status: DC
  Administered 2019-02-21: 2 g via INTRAVENOUS
  Filled 2019-02-20: qty 2

## 2019-02-20 MED ORDER — POTASSIUM CHLORIDE 10 MEQ/100ML IV SOLN
10.0000 meq | INTRAVENOUS | Status: AC
  Administered 2019-02-20 (×2): 10 meq via INTRAVENOUS
  Filled 2019-02-20 (×3): qty 100

## 2019-02-20 MED ORDER — POTASSIUM CHLORIDE IN NACL 20-0.9 MEQ/L-% IV SOLN
INTRAVENOUS | Status: DC
  Administered 2019-02-21: via INTRAVENOUS
  Filled 2019-02-20: qty 1000

## 2019-02-20 MED ORDER — ACETAMINOPHEN 325 MG PO TABS: 650.0000 mg | ORAL_TABLET | Freq: Four times a day (QID) | ORAL | Status: DC | PRN

## 2019-02-20 MED ORDER — TRAMADOL HCL 50 MG PO TABS: 50.0000 mg | ORAL_TABLET | Freq: Three times a day (TID) | ORAL | Status: DC | PRN

## 2019-02-20 MED ORDER — ONDANSETRON HCL 4 MG PO TABS: 4.0000 mg | ORAL_TABLET | Freq: Four times a day (QID) | ORAL | Status: DC | PRN

## 2019-02-20 MED ORDER — CLOPIDOGREL BISULFATE 75 MG PO TABS
75.0000 mg | ORAL_TABLET | Freq: Every day | ORAL | Status: DC
  Filled 2019-02-20: qty 1

## 2019-02-20 MED ORDER — MAGNESIUM SULFATE 2 GM/50ML IV SOLN
2.0000 g | Freq: Once | INTRAVENOUS | Status: AC
  Administered 2019-02-20: 2 g via INTRAVENOUS
  Filled 2019-02-20: qty 50

## 2019-02-20 MED ORDER — ACETAMINOPHEN 650 MG RE SUPP: 650.0000 mg | Freq: Four times a day (QID) | RECTAL | Status: DC | PRN

## 2019-02-20 MED ORDER — SODIUM CHLORIDE 0.9 % IV BOLUS
1000.0000 mL | Freq: Once | INTRAVENOUS | Status: AC
  Administered 2019-02-20: 1000 mL via INTRAVENOUS

## 2019-02-20 MED ORDER — SIMVASTATIN 40 MG PO TABS
40.0000 mg | ORAL_TABLET | Freq: Every day | ORAL | Status: DC
  Filled 2019-02-20: qty 1

## 2019-02-20 MED ORDER — ONDANSETRON HCL 4 MG/2ML IJ SOLN: 4.0000 mg | Freq: Four times a day (QID) | INTRAMUSCULAR | Status: DC | PRN

## 2019-02-20 MED ORDER — SODIUM CHLORIDE 0.9 % IV BOLUS
500.0000 mL | Freq: Once | INTRAVENOUS | Status: AC
  Administered 2019-02-20: 500 mL via INTRAVENOUS

## 2019-02-20 MED ORDER — LEVOTHYROXINE SODIUM 112 MCG PO TABS: 112.0000 ug | ORAL_TABLET | Freq: Every day | ORAL | Status: DC

## 2019-02-20 MED ORDER — LIDOCAINE VISCOUS HCL 2 % MT SOLN: 15.0000 mL | Freq: Four times a day (QID) | OROMUCOSAL | Status: DC | PRN

## 2019-02-20 MED ORDER — SODIUM CHLORIDE 0.9% FLUSH
3.0000 mL | Freq: Two times a day (BID) | INTRAVENOUS | Status: DC
  Administered 2019-02-21 – 2019-02-27 (×10): 3 mL via INTRAVENOUS

## 2019-02-20 MED ORDER — DEXAMETHASONE SODIUM PHOSPHATE 4 MG/ML IJ SOLN
4.0000 mg | Freq: Two times a day (BID) | INTRAMUSCULAR | Status: DC
  Administered 2019-02-21: 4 mg via INTRAVENOUS
  Filled 2019-02-20: qty 1

## 2019-02-20 NOTE — ED Triage Notes (Signed)
She has hx of throat cancer. She phoned EMS because she fell onto her floor and was simply too weak to get up. She arrives here in no distress.

## 2019-02-20 NOTE — ED Notes (Signed)
Pt BP on monitor was reading low, manual BP was obtained 88/62. MD notified and orders were given. Will continue to monitor.

## 2019-02-20 NOTE — Telephone Encounter (Signed)
Called patient on the phone to schedule S/P XRT CK with Dental Medicine. I was unable to leave voicemail and was prompted to call back at another time. Gloria Murray

## 2019-02-20 NOTE — Telephone Encounter (Signed)
Was notified by infusion charge nurse that pt FTKA for infusion appts this week, and also  did not confirm transportation appts.  Attempted to call pt unsuccessfully, and no voice mail available.  Called friend [name].  [name] to go to pt's home for follow up. [name] called back and informed nurse that pt is not doing well.  [name] found pt sitting on the floor at home and unable to get up.  Per [name], she thinks " the cancer has messed up her mind; she did not pay phone bills, and that is why pt did not answer her phone ".  [name] also stated pt is not really "  with it ". Dr. Maylon Peppers notified.  Instructed [name] to call 911 and have pt taken to ER for further evaluation.   [name] voiced understanding, and stated she would do so.

## 2019-02-20 NOTE — H&P (Addendum)
History and Physical    Gloria Murray OMV:[id number] DOB: 1950-09-05 DOA: 02/24/2019  PCP: Jearld Fenton, NP  Patient coming from: Home  I have personally briefly reviewed patient's old medical records in [place]  Chief Complaint: Fall at home  HPI: [name] is a 68 y.o. 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  Patient states that she was sitting down at home when she suddenly fell onto the floor.  She denies injuring herself or losing consciousness.  She denies any associated lightheadedness, dizziness, chest pain, palpitations, dyspnea, abdominal pain, dysuria, or diarrhea.  She is currently undergoing chemoradiation therapy for invasive squamous cell carcinoma of the supraglottic larynx.  She states she has been on a liquid diet but thinks she has been keeping up with her oral intake.  She has noticed dry flaky skin since beginning her chemoradiation therapy.  ED Course:  Initial vitals showed BP 93/71, pulse 97, RR 16, temp 92.9 Fahrenheit rectally, SPO2 100% on room air.  Labs are notable for WBC 2.8, hemoglobin 10.5, platelets 79,000, sodium 146, potassium 2.9, magnesium 2.1, bicarb 31, BUN 44, creatinine 0.85, lactic acid 1.3.  Urinalysis was negative for UTI. SARS-CoV-2 test was negative.  Blood cultures were drawn and pending.  Portable chest x-ray showed right chest wall Port-A-Cath in place without focal consolidation, effusion, or edema.  Patient was given 2 L normal saline and ordered to receive IV potassium 10 mEq x 3 runs.  A bear hugger was placed.  Orthostatic vitals were not obtained.  The hospitalist service was consulted to admit for further evaluation and management.  Review of Systems: All systems reviewed  and are negative except as documented in history of present illness above.   Past Medical History:  Diagnosis Date  . Allergy   . Carotid artery occlusion   . Heart murmur   . Hyperlipidemia   . Hypertension   . Hypothyroidism   . Laryngeal mass   . Osteoporosis   . Thyroid disease   . Wears glasses   . Wears upper complete and lower partial dentures     Past Surgical History:  Procedure Laterality Date  . ARCH AORTOGRAM N/A 07/23/2014   Procedure: ARCH AORTOGRAM;  Surgeon: Rosetta Posner, MD;  Location: [facility] CATH LAB;  Service: Cardiovascular;  Laterality: N/A;  . CAROTID ANGIOGRAM N/A 07/23/2014   Procedure: CAROTID ANGIOGRAM;  Surgeon: Rosetta Posner, MD;  Location: [facility] CATH LAB;  Service: Cardiovascular;  Laterality: N/A;  . CAROTID ENDARTERECTOMY  12/11/2008   left  . CAROTID STENT INSERTION Left 10/14/2014   Procedure: CAROTID STENT INSERTION;  Surgeon: Serafina Mitchell, MD;  Location: [facility] CATH LAB;  Service: Cardiovascular;  Laterality: Left;   carotid  . CARPAL TUNNEL RELEASE     X 2, right wrist  . COLONOSCOPY  Jan. 2, 2015  . DILATION AND CURETTAGE OF UTERUS     X 2  . IR IMAGING GUIDED PORT INSERTION  12/06/2018  . laceration hand Right    hand  . LARYNGOSCOPY N/A 11/08/2018   Procedure: DIRECT MICROLARYNGOSCOPY WITH BIOPSY;  Surgeon: Melida Quitter, MD;  Location: [place];  Service: ENT;  Laterality: N/A;  . RIGID ESOPHAGOSCOPY N/A 11/08/2018   Procedure: ESOPHAGOSCOPY;  Surgeon: Melida Quitter, MD;  Location: [place];  Service: ENT;  Laterality: N/A;  . THYROIDECTOMY    . TUBAL LIGATION  1975   bilateral    Social History:  reports that she quit smoking about 4 years ago. Her smoking use included cigarettes. She smoked 0.50 packs per day. She has never used smokeless tobacco. She reports that she does not drink alcohol or use drugs.  Allergies  Allergen Reactions  . Propoxyphene Hcl Itching and Nausea Only    REACTION: nausea and itch  . Propoxyphene N-Acetaminophen Itching  and Nausea Only    REACTION: nausea and  itch    Family History  Problem Relation Age of Onset  . Diabetes Mother   . Colon cancer Neg Hx      Prior to Admission medications   Medication Sig Start Date End Date Taking? Authorizing Provider  Ascorbic Acid (VITAMIN C PO) Take 1 tablet by mouth daily.   Yes [provider]  aspirin EC 81 MG tablet Take 81 mg by mouth daily.   Yes [provider]  CALCIUM PO Take 1 tablet by mouth daily.   Yes [provider]  Cholecalciferol (VITAMIN D3 PO) Take 1 tablet by mouth daily.   Yes [provider]  clopidogrel (PLAVIX) 75 MG tablet TAKE 1 TABLET (75 MG TOTAL) BY MOUTH DAILY. 11/02/18  Yes Baity, Coralie Keens, NP  fexofenadine (ALLEGRA) 180 MG tablet Take 180 mg by mouth at bedtime.    Yes [provider]  fluticasone (FLONASE) 50 MCG/ACT nasal spray Place 1 spray into both nostrils daily.    Yes [provider]  Garlic 1610 MG CAPS Take 1 capsule by mouth 2 (two) times daily.     Yes [provider]  hydrochlorothiazide (MICROZIDE) 12.5 MG capsule TAKE 1 CAPSULE EVERY DAY 12/17/18  Yes Jearld Fenton, NP  levothyroxine (SYNTHROID) 112 MCG tablet TAKE 1 TABLET EVERY DAY 12/17/18  Yes Baity, Coralie Keens, NP  lidocaine (XYLOCAINE) 2 % solution Patient: Mix 1part 2% viscous lidocaine, 1part H20. Swish & swallow 9mL of diluted mixture, 51min before meals and at bedtime, up to QID 12/10/18  Yes Eppie Gibson, MD  lidocaine-prilocaine (EMLA) cream Apply to affected area once 01/16/19  Yes Tish Men, MD  metoprolol succinate (TOPROL-XL) 25 MG 24 hr tablet TAKE 1 TABLET EVERY DAY 12/17/18  Yes Baity, Coralie Keens, NP  Omega-3 Fatty Acids (FISH OIL) 1000 MG CAPS Take 1 capsule (1,000 mg total) by mouth daily. 04/11/11  Yes Lucille Passy, MD  ondansetron (ZOFRAN) 8 MG tablet Take 1 tablet (8 mg total) by mouth 2 (two) times daily as needed for refractory nausea / vomiting. Start on day 3 after chemotherapy.  01/23/19  Yes Tish Men, MD  potassium chloride SA (K-DUR) 20 MEQ tablet Take 1 tablet (20 mEq total) by mouth daily. 02/19/19 03/21/19 Yes Tish Men, MD  prochlorperazine (COMPAZINE) 10 MG tablet Take 1 tablet (10 mg total) by mouth every 6 (six) hours as needed (Nausea or vomiting). 01/23/19  Yes Tish Men, MD  simvastatin (ZOCOR) 40 MG tablet TAKE 1 TABLET AT BEDTIME 12/17/18  Yes Baity, Coralie Keens, NP  sodium fluoride (PREVIDENT 5000 PLUS) 1.1 % CREA dental cream Apply cream to tooth brush. Brush teeth for 2 minutes. Spit out excess. DO NOT rinse afterwards. Repeat nightly. 11/22/18  Yes Lenn Cal, DDS  VITAMIN E PO Take 1 capsule by mouth daily.   Yes [provider]  fluconazole (DIFLUCAN) 100 MG tablet Take 2 tabs on the first  day, and then 1 tab daily until finished Patient not taking: Reported on 02/13/2019 02/06/19   Tish Men, MD  LORazepam (ATIVAN) 0.5 MG tablet Take 1 tablet (0.5 mg total) by mouth every 6 (six) hours as needed (Nausea or vomiting). Patient not taking: Reported on 02/07/2019 11/28/18   Tish Men, MD  traMADol (ULTRAM) 50 MG tablet Take 1 tablet (50 mg total) by mouth every 8 (eight) hours as needed. Mail to the patient please. Patient has [employer] of $700.00. Patient taking differently: Take 50 mg by mouth every 8 (eight) hours as needed for moderate pain or severe pain. Mail to the patient please. Patient has [employer] of $700.00. 12/19/18   Tish Men, MD    Physical Exam: Vitals:   02/12/2019 2000 02/02/2019 2017 02/24/2019 2045 02/03/2019 2100  BP: 93/73  (!) 79/58 (!) 88/58  Pulse: 89   97  Resp: 14  16 20   Temp:  (!) 94.6 F (34.8 C) (!) 94.5 F (34.7 C) (!) 95 F (35 C)  TempSrc:  Rectal    SpO2: 97%  100% 100%    Constitutional: Chronically ill-appearing elderly woman resting supine in bed, NAD, calm, comfortable, bear hugger in place Eyes: PERRL, lids and conjunctivae normal ENMT: Mucous membranes are dry. Posterior pharynx clear of any exudate or  lesions.voice is hoarse. Neck: normal, supple, no masses. Respiratory: Inspiratory crackles bilateral upper lung fields.. Normal respiratory effort. No accessory muscle use.  Cardiovascular: Regular rate and rhythm, soft systolic murmur. No extremity edema. 2+ pedal pulses.  Port-A-Cath in place right chest wall. Abdomen: no tenderness, no masses palpated. No hepatosplenomegaly. Bowel sounds positive.  Musculoskeletal: no clubbing / cyanosis. No joint deformity upper and lower extremities. Good ROM, no contractures. Skin: Dry flaky skin at the neck and upper chest wall Neurologic: CN 2-12 grossly intact. Sensation intact, Strength 5/5 in all 4.  Psychiatric: Alert and oriented x 3.  Flat affect.    Labs on Admission: I have personally reviewed following labs and imaging studies  CBC: Recent Labs  Lab 02/17/2019 1718  WBC 2.8*  NEUTROABS 2.5  HGB 10.5*  HCT 30.3*  MCV 92.4  PLT 79*   Basic Metabolic Panel: Recent Labs  Lab 02/06/2019 1718  NA 146*  K 2.9*  CL 99  CO2 31  GLUCOSE 120*  BUN 44*  CREATININE 0.85  CALCIUM 9.9   GFR: Estimated Creatinine Clearance: 65.1 mL/min (by C-G formula based on SCr of 0.85 mg/dL). Liver Function Tests: Recent Labs  Lab 01/30/2019 1718  AST 21  ALT 29  ALKPHOS 45  BILITOT 1.5*  PROT 6.5  ALBUMIN 3.8   No results for input(s): LIPASE, AMYLASE in the last 168 hours. No results for input(s): AMMONIA in the last 168 hours. Coagulation Profile: No results for input(s): INR, PROTIME in the last 168 hours. Cardiac Enzymes: No results for input(s): CKTOTAL, CKMB, CKMBINDEX, TROPONINI in the last 168 hours. BNP (last 3 results) No results for input(s): PROBNP in the last 8760 hours. HbA1C: No results for input(s): HGBA1C in the last 72 hours. CBG: No results for input(s): GLUCAP in the last 168 hours. Lipid Profile: No results for input(s): CHOL, HDL, LDLCALC, TRIG, CHOLHDL, LDLDIRECT in the last 72 hours. Thyroid Function Tests: No  results for input(s): TSH, T4TOTAL, FREET4, T3FREE, THYROIDAB in the last 72 hours. Anemia Panel: No results for input(s): VITAMINB12, FOLATE, FERRITIN, TIBC, IRON, RETICCTPCT in the last 72 hours. Urine analysis:    Component Value Date/Time  COLORURINE AMBER (A) 02/19/2019 2044   APPEARANCEUR CLEAR 02/13/2019 2044   LABSPEC 1.020 02/23/2019 2044   PHURINE 5.0 02/26/2019 2044   GLUCOSEU NEGATIVE 01/30/2019 2044   HGBUR NEGATIVE 02/24/2019 2044   BILIRUBINUR SMALL (A) 02/03/2019 2044   KETONESUR 5 (A) 02/23/2019 2044   PROTEINUR NEGATIVE 02/01/2019 2044   UROBILINOGEN 0.2 12/10/2008 1306   NITRITE NEGATIVE 02/25/2019 2044   LEUKOCYTESUR NEGATIVE 02/08/2019 2044    Radiological Exams on Admission: Dg Chest Port 1 View  Result Date: 02/02/2019 CLINICAL DATA:  Weakness.  Fall. EXAM: PORTABLE CHEST 1 VIEW COMPARISON:  PET-CT dated November 23, 2018. Chest x-ray dated Dec 10, 2008. FINDINGS: Right chest wall port catheter with tip at the cavoatrial junction. The heart size and mediastinal contours are within normal limits. Atherosclerotic calcification of the aortic arch. Normal pulmonary vascularity. No focal consolidation, pleural effusion, or pneumothorax. No acute osseous abnormality. Left internal carotid artery stent. IMPRESSION: No active disease. Electronically Signed   By: Titus Dubin M.D.   On: 02/03/2019 17:43    EKG: Not performed.  Assessment/Plan Principal Problem:   Hypothermia Active Problems:   Hypothyroidism   HYPERLIPIDEMIA, MIXED, MILD   HYPERTENSION, BENIGN ESSENTIAL   Malignant neoplasm of supraglottis (HCC)   Hypokalemia   Pancytopenia (HCC)   Hypotension  Gloria Murray is a 68 y.o. 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   Hypothermia/Hypotension/dehydration: Suspect due to significant dehydration from likely poor oral intake.  No obvious infectious source on admission.  Possibly worsened by chemoradiation therapy. -Continue Bear hugger -Give additional 1 L fluid bolus followed by maintenance fluids overnight -Check orthostatic vitals in the morning -Follow-up blood cultures  ADDENDUM: Patient with continued hypotension.  There is some concern for adrenal insufficiency given constellation of signs/symptoms.  Will start empiric IV dexamethasone 4 mg every 12 hours and continue IV fluid resuscitation.  Obtain cortisol, ACTH, renin/aldosterone labs.  Will also order a.m. cortisol level.  Given immunocompromised state, will also cover with empiric antibiotics and start IV vancomycin and cefepime.  De-escalate as able pending blood culture data.  Hypokalemia: Secondary to chemotherapy and likely nutritional deficit. -IV and oral supplementation ordered, recheck in a.m. -Hold home HCTZ  Malignant neoplasm of supraglottis: Follows with oncology and radiation oncology status post definitive chemoradiotherapy with weekly carboplatin.  Pancytopenia: Secondary to chemotherapy.  Thrombocytopenia worsening from recent labs.  No obvious bleeding noted.  Continue to monitor while on aspirin and Plavix.  Fall at home: Without obvious injury.  Patient denies any associated symptoms at the time of her fall but states she was sitting down when this happened.  Orthostatic vitals not obtained in the ED, she has already received 2 L normal saline. -Will check orthostatic vitals in a.m., has already received IV fluid resuscitation  Hypertension: Hypotensive on admission. -Holding home HCTZ, Toprol-XL -Continue IV fluid resuscitation as above  Carotid artery stenosis status post left CEA: -Continue aspirin, Plavix, simvastatin  Hyperlipidemia: -Continue simvastatin  Hypothyroidism:  -Continue Synthroid -Check TSH  DVT prophylaxis: SCDs Code Status: Full code, confirmed with patient Family Communication: None present on admission Disposition Plan: Pending clinical progress Consults called: None Admission status: Observation   Zada Finders MD [facility]  If 7PM-7AM, please contact night-coverage [email or web address]  02/14/2019, 9:12 PM

## 2019-02-20 NOTE — ED Notes (Signed)
Admitting MD at bedside.

## 2019-02-20 NOTE — ED Notes (Signed)
Dr. Posey Pronto made aware of Systolic BP in 67'R. Was advised to give 500 mL of NS. Continue to monitor BP, MAP >60. Notify if MAP <60.

## 2019-02-20 NOTE — ED Provider Notes (Addendum)
[facility] DEPT Provider Note   CSN: [id number] Arrival date & time: 02/01/2019  1606    History   Chief Complaint Chief Complaint  Patient presents with  . Weakness  . Cancer    HPI Gloria Murray is a 68 y.o. female.     Level 5 caveat for acuity of condition.  Chief complaint weakness.  Patient is currently receiving treatment for throat cancer.  She fell on the floor today and was too weak to get up.  Initial temperature was 92.9 F.  She has no specific complaints.  No chest pain, dyspnea, dysuria, fever, sweats, chills.     Past Medical History:  Diagnosis Date  . Allergy   . Carotid artery occlusion   . Heart murmur   . Hyperlipidemia   . Hypertension   . Hypothyroidism   . Laryngeal mass   . Osteoporosis   . Thyroid disease   . Wears glasses   . Wears upper complete and lower partial dentures     Patient Active Problem List   Diagnosis Date Noted  . Hypokalemia 02/06/2019  . Oral candidiasis 02/06/2019  . Anemia due to antineoplastic chemotherapy 01/23/2019  . Chemotherapy-induced thrombocytopenia 01/16/2019  . Chemotherapy induced nausea and vomiting 01/02/2019  . Protein malnutrition ([place]) 01/02/2019  . Leukopenia due to antineoplastic chemotherapy ([place]) 12/26/2018  . Port-A-Cath in place 12/19/2018  . Hypomagnesemia 12/19/2018  . 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 Osteoporosis 07/07/2017  . Seasonal allergies 07/07/2017  . Carotid stenosis 10/14/2014  . Hypothyroidism 01/04/2007  . HYPERLIPIDEMIA, MIXED, MILD 01/04/2007  . HYPERTENSION, BENIGN ESSENTIAL 01/04/2007    Past Surgical History:  Procedure Laterality Date  . ARCH AORTOGRAM N/A 07/23/2014   Procedure: ARCH AORTOGRAM;  Surgeon: Rosetta Posner, MD;  Location: [facility] CATH LAB;  Service: Cardiovascular;  Laterality: N/A;  . CAROTID ANGIOGRAM N/A 07/23/2014   Procedure: CAROTID ANGIOGRAM;  Surgeon: Rosetta Posner, MD;  Location: [facility] CATH LAB;   Service: Cardiovascular;  Laterality: N/A;  . CAROTID ENDARTERECTOMY  12/11/2008   left  . CAROTID STENT INSERTION Left 10/14/2014   Procedure: CAROTID STENT INSERTION;  Surgeon: Serafina Mitchell, MD;  Location: [facility] CATH LAB;  Service: Cardiovascular;  Laterality: Left;   carotid  . CARPAL TUNNEL RELEASE     X 2, right wrist  . COLONOSCOPY  Jan. 2, 2015  . DILATION AND CURETTAGE OF UTERUS     X 2  . IR IMAGING GUIDED PORT INSERTION  12/06/2018  . laceration hand Right    hand  . LARYNGOSCOPY N/A 11/08/2018   Procedure: DIRECT MICROLARYNGOSCOPY WITH BIOPSY;  Surgeon: Melida Quitter, MD;  Location: [facility];  Service: ENT;  Laterality: N/A;  . RIGID ESOPHAGOSCOPY N/A 11/08/2018   Procedure: ESOPHAGOSCOPY;  Surgeon: Melida Quitter, MD;  Location: [facility];  Service: ENT;  Laterality: N/A;  . THYROIDECTOMY    . TUBAL LIGATION  1975   bilateral     OB History   No obstetric history on file.      Home Medications    Prior to Admission medications   Medication Sig Start Date End Date Taking? Authorizing Provider  Ascorbic Acid (VITAMIN C PO) Take 1 tablet by mouth daily.   Yes [provider]  aspirin EC 81 MG tablet Take 81 mg by mouth daily.   Yes [provider]  CALCIUM PO Take 1 tablet by mouth daily.   Yes [provider]  Cholecalciferol (VITAMIN D3 PO)  Take 1 tablet by mouth daily.   Yes [provider]  clopidogrel (PLAVIX) 75 MG tablet TAKE 1 TABLET (75 MG TOTAL) BY MOUTH DAILY. 11/02/18  Yes Baity, Coralie Keens, NP  fexofenadine (ALLEGRA) 180 MG tablet Take 180 mg by mouth at bedtime.    Yes [provider]  fluticasone (FLONASE) 50 MCG/ACT nasal spray Place 1 spray into both nostrils daily.    Yes [provider]  Garlic 8416 MG CAPS Take 1 capsule by mouth 2 (two) times daily.     Yes [provider]  hydrochlorothiazide (MICROZIDE) 12.5 MG capsule TAKE 1 CAPSULE EVERY DAY 12/17/18  Yes Jearld Fenton, NP  levothyroxine  (SYNTHROID) 112 MCG tablet TAKE 1 TABLET EVERY DAY 12/17/18  Yes Baity, Coralie Keens, NP  lidocaine (XYLOCAINE) 2 % solution Patient: Mix 1part 2% viscous lidocaine, 1part H20. Swish & swallow 16mL of diluted mixture, 49min before meals and at bedtime, up to QID 12/10/18  Yes Eppie Gibson, MD  lidocaine-prilocaine (EMLA) cream Apply to affected area once 01/16/19  Yes Tish Men, MD  metoprolol succinate (TOPROL-XL) 25 MG 24 hr tablet TAKE 1 TABLET EVERY DAY 12/17/18  Yes Baity, Coralie Keens, NP  Omega-3 Fatty Acids (FISH OIL) 1000 MG CAPS Take 1 capsule (1,000 mg total) by mouth daily. 04/11/11  Yes Lucille Passy, MD  ondansetron (ZOFRAN) 8 MG tablet Take 1 tablet (8 mg total) by mouth 2 (two) times daily as needed for refractory nausea / vomiting. Start on day 3 after chemotherapy. 01/23/19  Yes Tish Men, MD  potassium chloride SA (K-DUR) 20 MEQ tablet Take 1 tablet (20 mEq total) by mouth daily. 02/19/19 03/21/19 Yes Tish Men, MD  prochlorperazine (COMPAZINE) 10 MG tablet Take 1 tablet (10 mg total) by mouth every 6 (six) hours as needed (Nausea or vomiting). 01/23/19  Yes Tish Men, MD  simvastatin (ZOCOR) 40 MG tablet TAKE 1 TABLET AT BEDTIME 12/17/18  Yes Baity, Coralie Keens, NP  sodium fluoride (PREVIDENT 5000 PLUS) 1.1 % CREA dental cream Apply cream to tooth brush. Brush teeth for 2 minutes. Spit out excess. DO NOT rinse afterwards. Repeat nightly. 11/22/18  Yes Lenn Cal, DDS  VITAMIN E PO Take 1 capsule by mouth daily.   Yes [provider]  fluconazole (DIFLUCAN) 100 MG tablet Take 2 tabs on the first day, and then 1 tab daily until finished Patient not taking: Reported on 02/13/2019 02/06/19   Tish Men, MD  LORazepam (ATIVAN) 0.5 MG tablet Take 1 tablet (0.5 mg total) by mouth every 6 (six) hours as needed (Nausea or vomiting). Patient not taking: Reported on 02/27/2019 11/28/18   Tish Men, MD  traMADol (ULTRAM) 50 MG tablet Take 1 tablet (50 mg total) by mouth every 8 (eight) hours as needed.  Mail to the patient please. Patient has [employer] of $700.00. Patient taking differently: Take 50 mg by mouth every 8 (eight) hours as needed for moderate pain or severe pain. Mail to the patient please. Patient has [employer] of $700.00. 12/19/18   Tish Men, MD    Family History Family History  Problem Relation Age of Onset  . Diabetes Mother   . Colon cancer Neg Hx     Social History Social History   Tobacco Use  . Smoking status: Former Smoker    Packs/day: 0.50    Types: Cigarettes    Quit date: 07/23/2014    Years since quitting: 4.5  . Smokeless tobacco: Never Used  .  Tobacco comment: quit smoking cigarettes in " 2018-19"  Substance Use Topics  . Alcohol use: No    Alcohol/week: 0.0 standard drinks  . Drug use: No     Allergies   Propoxyphene hcl and Propoxyphene n-acetaminophen   Review of Systems Review of Systems  Unable to perform ROS: Acuity of condition     Physical Exam Updated Vital Signs BP 108/75   Pulse (!) 55   Temp (!) 92.9 F (33.8 C) (Rectal) Comment: RN,Tim made aware of pt. low temperature. pt. placed on bair hugger per Itzae Miralles,MD.  Resp 14   SpO2 99%   Physical Exam Vitals signs and nursing note reviewed.  Constitutional:      Appearance: She is well-developed.     Comments: Pale, answers questions appropriately  HENT:     Head: Normocephalic and atraumatic.  Eyes:     Conjunctiva/sclera: Conjunctivae normal.  Neck:     Musculoskeletal: Neck supple.  Cardiovascular:     Rate and Rhythm: Normal rate and regular rhythm.  Pulmonary:     Effort: Pulmonary effort is normal.     Breath sounds: Normal breath sounds.  Abdominal:     General: Bowel sounds are normal.     Palpations: Abdomen is soft.  Musculoskeletal: Normal range of motion.  Skin:    General: Skin is warm and dry.  Neurological:     Mental Status: She is alert and oriented to person, place, and time.  Psychiatric:        Behavior: Behavior normal.      ED  Treatments / Results  Labs (all labs ordered are listed, but only abnormal results are displayed) Labs Reviewed  CBC WITH DIFFERENTIAL/PLATELET - Abnormal; Notable for the following components:      Result Value   WBC 2.8 (*)    RBC 3.28 (*)    Hemoglobin 10.5 (*)    HCT 30.3 (*)    Platelets 79 (*)    nRBC 0.7 (*)    Lymphs Abs 0.1 (*)    All other components within normal limits  COMPREHENSIVE METABOLIC PANEL - Abnormal; Notable for the following components:   Sodium 146 (*)    Potassium 2.9 (*)    Glucose, Bld 120 (*)    BUN 44 (*)    Total Bilirubin 1.5 (*)    Anion gap 16 (*)    All other components within normal limits  CULTURE, BLOOD (ROUTINE X 2)  CULTURE, BLOOD (ROUTINE X 2)  LACTIC ACID, PLASMA  URINALYSIS, ROUTINE W REFLEX MICROSCOPIC  LACTIC ACID, PLASMA    EKG None  Radiology Dg Chest Port 1 View  Result Date: 02/07/2019 CLINICAL DATA:  Weakness.  Fall. EXAM: PORTABLE CHEST 1 VIEW COMPARISON:  PET-CT dated November 23, 2018. Chest x-ray dated Dec 10, 2008. FINDINGS: Right chest wall port catheter with tip at the cavoatrial junction. The heart size and mediastinal contours are within normal limits. Atherosclerotic calcification of the aortic arch. Normal pulmonary vascularity. No focal consolidation, pleural effusion, or pneumothorax. No acute osseous abnormality. Left internal carotid artery stent. IMPRESSION: No active disease. Electronically Signed   By: Titus Dubin M.D.   On: 02/10/2019 17:43    Procedures Procedures (including critical care time)  Medications Ordered in ED Medications  sodium chloride 0.9 % bolus 1,000 mL (1,000 mLs Intravenous New Bag/Given 02/17/2019 1822)     Initial Impression / Assessment and Plan / ED Course  I have reviewed the triage vital signs and the nursing notes.  Pertinent labs & imaging results that were available during my care of the patient were reviewed by me and considered in my medical decision making (see chart  for details).        Known diagnosis of throat cancer presently receiving treatment slipped on the floor today and could not get up.  She is hypothermic.  Will initiate IV fluids, bear hugger, blood cultures, admission.   CRITICAL CARE Performed by: Nat Christen Total critical care time: 30 minutes Critical care time was exclusive of separately billable procedures and treating other patients. Critical care was necessary to treat or prevent imminent or life-threatening deterioration. Critical care was time spent personally by me on the following activities: development of treatment plan with patient and/or surrogate as well as nursing, discussions with consultants, evaluation of patient's response to treatment, examination of patient, obtaining history from patient or surrogate, ordering and performing treatments and interventions, ordering and review of laboratory studies, ordering and review of radiographic studies, pulse oximetry and re-evaluation of patient's condition.  Final Clinical Impressions(s) / ED Diagnoses   Final diagnoses:  Weakness  Hypothermia, initial encounter  Hypokalemia  Malignant neoplasm of supraglottis [facility])    ED Discharge Orders    None       Nat Christen, MD 02/27/2019 1722    Nat Christen, MD 02/18/2019 [phone]

## 2019-02-20 NOTE — Progress Notes (Addendum)
Pharmacy Antibiotic Note  Gloria Murray is a 68 y.o. female admitted on 02/07/2019 with sepsis.  Pharmacy has been consulted for cefepime and vancomycin dosing.  Plan: Cefepime 2 Gm IV q8h Vancomycin 1750 mg x1 then 1500 mg IV q24h for est AUC = 512 Goal AUC = 400-550 F/u scr/cultures/levels     Temp (24hrs), Avg:95.5 F (35.3 C), Min:92.9 F (33.8 C), Max:97.3 F (36.3 C)  Recent Labs  Lab 02/07/2019 1718 02/11/2019 1719 02/19/2019 1940  WBC 2.8*  --   --   CREATININE 0.85  --   --   LATICACIDVEN  --  1.3 1.0    Estimated Creatinine Clearance: 65.1 mL/min (by C-G formula based on SCr of 0.85 mg/dL).    Allergies  Allergen Reactions  . Propoxyphene Hcl Itching and Nausea Only    REACTION: nausea and itch  . Propoxyphene N-Acetaminophen Itching and Nausea Only    REACTION: nausea and  itch    Antimicrobials this admission: 7/22 cefepime >>  7/23 vancomycin >>   Dose adjustments this admission:   Microbiology results:  BCx:   UCx:    Sputum:    MRSA PCR:   Thank you for allowing pharmacy to be a part of this patient's care.  Dorrene German 02/21/2019 11:52 PM   Addendum: D/c cefepime Zosyn 3.375 Gm IV q8hEI Daily scr F/u levels/cultures  Dorrene German 02/21/2019 2:16 AM

## 2019-02-21 ENCOUNTER — Other Ambulatory Visit: Payer: Self-pay

## 2019-02-21 DIAGNOSIS — A419 Sepsis, unspecified organism: Secondary | ICD-10-CM | POA: Diagnosis not present

## 2019-02-21 DIAGNOSIS — G9340 Encephalopathy, unspecified: Secondary | ICD-10-CM | POA: Diagnosis not present

## 2019-02-21 DIAGNOSIS — G9341 Metabolic encephalopathy: Secondary | ICD-10-CM | POA: Diagnosis present

## 2019-02-21 DIAGNOSIS — Z7189 Other specified counseling: Secondary | ICD-10-CM | POA: Diagnosis not present

## 2019-02-21 DIAGNOSIS — Z79899 Other long term (current) drug therapy: Secondary | ICD-10-CM | POA: Diagnosis not present

## 2019-02-21 DIAGNOSIS — I959 Hypotension, unspecified: Secondary | ICD-10-CM | POA: Diagnosis not present

## 2019-02-21 DIAGNOSIS — E876 Hypokalemia: Secondary | ICD-10-CM

## 2019-02-21 DIAGNOSIS — T68XXXA Hypothermia, initial encounter: Secondary | ICD-10-CM | POA: Diagnosis not present

## 2019-02-21 DIAGNOSIS — D61810 Antineoplastic chemotherapy induced pancytopenia: Secondary | ICD-10-CM | POA: Diagnosis present

## 2019-02-21 DIAGNOSIS — Z87891 Personal history of nicotine dependence: Secondary | ICD-10-CM | POA: Diagnosis not present

## 2019-02-21 DIAGNOSIS — Z515 Encounter for palliative care: Secondary | ICD-10-CM | POA: Diagnosis not present

## 2019-02-21 DIAGNOSIS — T451X5A Adverse effect of antineoplastic and immunosuppressive drugs, initial encounter: Secondary | ICD-10-CM | POA: Diagnosis not present

## 2019-02-21 DIAGNOSIS — E870 Hyperosmolality and hypernatremia: Secondary | ICD-10-CM | POA: Diagnosis present

## 2019-02-21 DIAGNOSIS — B370 Candidal stomatitis: Secondary | ICD-10-CM | POA: Diagnosis present

## 2019-02-21 DIAGNOSIS — R531 Weakness: Secondary | ICD-10-CM | POA: Diagnosis not present

## 2019-02-21 DIAGNOSIS — E782 Mixed hyperlipidemia: Secondary | ICD-10-CM | POA: Diagnosis not present

## 2019-02-21 DIAGNOSIS — W1830XA Fall on same level, unspecified, initial encounter: Secondary | ICD-10-CM | POA: Diagnosis present

## 2019-02-21 DIAGNOSIS — E890 Postprocedural hypothyroidism: Secondary | ICD-10-CM | POA: Diagnosis present

## 2019-02-21 DIAGNOSIS — R7881 Bacteremia: Secondary | ICD-10-CM | POA: Diagnosis not present

## 2019-02-21 DIAGNOSIS — E861 Hypovolemia: Secondary | ICD-10-CM | POA: Diagnosis not present

## 2019-02-21 DIAGNOSIS — Z7951 Long term (current) use of inhaled steroids: Secondary | ICD-10-CM | POA: Diagnosis not present

## 2019-02-21 DIAGNOSIS — I9589 Other hypotension: Secondary | ICD-10-CM | POA: Diagnosis not present

## 2019-02-21 DIAGNOSIS — Y92009 Unspecified place in unspecified non-institutional (private) residence as the place of occurrence of the external cause: Secondary | ICD-10-CM | POA: Diagnosis not present

## 2019-02-21 DIAGNOSIS — D701 Agranulocytosis secondary to cancer chemotherapy: Secondary | ICD-10-CM | POA: Diagnosis not present

## 2019-02-21 DIAGNOSIS — Z923 Personal history of irradiation: Secondary | ICD-10-CM | POA: Diagnosis not present

## 2019-02-21 DIAGNOSIS — R29898 Other symptoms and signs involving the musculoskeletal system: Secondary | ICD-10-CM | POA: Diagnosis not present

## 2019-02-21 DIAGNOSIS — R6521 Severe sepsis with septic shock: Secondary | ICD-10-CM

## 2019-02-21 DIAGNOSIS — Z79890 Hormone replacement therapy (postmenopausal): Secondary | ICD-10-CM | POA: Diagnosis not present

## 2019-02-21 DIAGNOSIS — J690 Pneumonitis due to inhalation of food and vomit: Secondary | ICD-10-CM | POA: Diagnosis present

## 2019-02-21 DIAGNOSIS — C321 Malignant neoplasm of supraglottis: Secondary | ICD-10-CM | POA: Diagnosis present

## 2019-02-21 DIAGNOSIS — Z20828 Contact with and (suspected) exposure to other viral communicable diseases: Secondary | ICD-10-CM | POA: Diagnosis present

## 2019-02-21 DIAGNOSIS — J9601 Acute respiratory failure with hypoxia: Secondary | ICD-10-CM | POA: Diagnosis present

## 2019-02-21 DIAGNOSIS — E039 Hypothyroidism, unspecified: Secondary | ICD-10-CM | POA: Diagnosis not present

## 2019-02-21 DIAGNOSIS — T17908A Unspecified foreign body in respiratory tract, part unspecified causing other injury, initial encounter: Secondary | ICD-10-CM | POA: Diagnosis not present

## 2019-02-21 DIAGNOSIS — E43 Unspecified severe protein-calorie malnutrition: Secondary | ICD-10-CM | POA: Diagnosis present

## 2019-02-21 DIAGNOSIS — Z9221 Personal history of antineoplastic chemotherapy: Secondary | ICD-10-CM | POA: Diagnosis not present

## 2019-02-21 DIAGNOSIS — Z7902 Long term (current) use of antithrombotics/antiplatelets: Secondary | ICD-10-CM | POA: Diagnosis not present

## 2019-02-21 DIAGNOSIS — C760 Malignant neoplasm of head, face and neck: Secondary | ICD-10-CM | POA: Diagnosis not present

## 2019-02-21 DIAGNOSIS — Z7982 Long term (current) use of aspirin: Secondary | ICD-10-CM | POA: Diagnosis not present

## 2019-02-21 DIAGNOSIS — A408 Other streptococcal sepsis: Secondary | ICD-10-CM | POA: Diagnosis present

## 2019-02-21 DIAGNOSIS — C140 Malignant neoplasm of pharynx, unspecified: Secondary | ICD-10-CM | POA: Diagnosis present

## 2019-02-21 DIAGNOSIS — Z66 Do not resuscitate: Secondary | ICD-10-CM | POA: Diagnosis not present

## 2019-02-21 DIAGNOSIS — D61818 Other pancytopenia: Secondary | ICD-10-CM | POA: Diagnosis not present

## 2019-02-21 DIAGNOSIS — I10 Essential (primary) hypertension: Secondary | ICD-10-CM | POA: Diagnosis present

## 2019-02-21 LAB — BLOOD CULTURE ID PANEL (REFLEXED)

## 2019-02-21 LAB — CBC
HCT: 28.7 % — ABNORMAL LOW (ref 36.0–46.0)
Hemoglobin: 9.5 g/dL — ABNORMAL LOW (ref 12.0–15.0)
MCH: 31.7 pg (ref 26.0–34.0)
MCHC: 33.1 g/dL (ref 30.0–36.0)
MCV: 95.7 fL (ref 80.0–100.0)
Platelets: 110 10*3/uL — ABNORMAL LOW (ref 150–400)
RBC: 3 MIL/uL — ABNORMAL LOW (ref 3.87–5.11)
RDW: 15.7 % — ABNORMAL HIGH (ref 11.5–15.5)
WBC: 5.9 10*3/uL (ref 4.0–10.5)
nRBC: 1.5 % — ABNORMAL HIGH (ref 0.0–0.2)

## 2019-02-21 LAB — BASIC METABOLIC PANEL
Anion gap: 17 — ABNORMAL HIGH (ref 5–15)
BUN: 30 mg/dL — ABNORMAL HIGH (ref 8–23)
CO2: 23 mmol/L (ref 22–32)
Calcium: 8.6 mg/dL — ABNORMAL LOW (ref 8.9–10.3)
Chloride: 108 mmol/L (ref 98–111)
Creatinine, Ser: 0.73 mg/dL (ref 0.44–1.00)
GFR calc Af Amer: >60 mL/min (ref >60)
GFR calc non Af Amer: >60 mL/min (ref >60)
Glucose, Bld: 122 mg/dL — ABNORMAL HIGH (ref 70–99)
Potassium: 3.4 mmol/L — ABNORMAL LOW (ref 3.5–5.1)
Sodium: 148 mmol/L — ABNORMAL HIGH (ref 135–145)

## 2019-02-21 LAB — MRSA PCR SCREENING: MRSA by PCR: NEGATIVE

## 2019-02-21 LAB — CORTISOL: Cortisol, Plasma: 31.9 ug/dL

## 2019-02-21 LAB — TSH: TSH: 20.091 u[IU]/mL — ABNORMAL HIGH (ref 0.350–4.500)

## 2019-02-21 LAB — CORTISOL-AM, BLOOD: Cortisol - AM: 15.5 ug/dL (ref 6.7–22.6)

## 2019-02-21 LAB — MAGNESIUM: Magnesium: 2.1 mg/dL (ref 1.7–2.4)

## 2019-02-21 MED ORDER — NOREPINEPHRINE 4 MG/250ML-% IV SOLN
0.0000 ug/min | INTRAVENOUS | Status: DC
  Administered 2019-02-21: 10 ug/min via INTRAVENOUS
  Filled 2019-02-21: qty 250

## 2019-02-21 MED ORDER — LIP MEDEX EX OINT
TOPICAL_OINTMENT | CUTANEOUS | Status: AC
  Administered 2019-02-21: 22:00:00
  Filled 2019-02-21: qty 7

## 2019-02-21 MED ORDER — CHLORHEXIDINE GLUCONATE CLOTH 2 % EX PADS: 6.0000 | MEDICATED_PAD | Freq: Once | CUTANEOUS | Status: DC

## 2019-02-21 MED ORDER — ORAL CARE MOUTH RINSE
15.0000 mL | Freq: Two times a day (BID) | OROMUCOSAL | Status: DC
  Administered 2019-02-22 – 2019-02-28 (×7): 15 mL via OROMUCOSAL

## 2019-02-21 MED ORDER — HYDROCORTISONE NA SUCCINATE PF 100 MG IJ SOLR
50.0000 mg | Freq: Four times a day (QID) | INTRAMUSCULAR | Status: DC
  Administered 2019-02-21 – 2019-02-22 (×6): 50 mg via INTRAVENOUS
  Filled 2019-02-21 (×5): qty 2

## 2019-02-21 MED ORDER — GABAPENTIN 300 MG PO CAPS: 300.0000 mg | ORAL_CAPSULE | ORAL | Status: AC

## 2019-02-21 MED ORDER — LEVOTHYROXINE SODIUM 100 MCG/5ML IV SOLN
56.0000 ug | Freq: Every day | INTRAVENOUS | Status: DC
  Administered 2019-02-21 – 2019-02-28 (×7): 56 ug via INTRAVENOUS
  Filled 2019-02-21 (×9): qty 5

## 2019-02-21 MED ORDER — POTASSIUM CHLORIDE 10 MEQ/100ML IV SOLN
10.0000 meq | INTRAVENOUS | Status: AC
  Administered 2019-02-21: 10 meq via INTRAVENOUS

## 2019-02-21 MED ORDER — ACETAMINOPHEN 500 MG PO TABS: 1000.0000 mg | ORAL_TABLET | ORAL | Status: AC

## 2019-02-21 MED ORDER — PIPERACILLIN-TAZOBACTAM 3.375 G IVPB
3.3750 g | Freq: Three times a day (TID) | INTRAVENOUS | Status: DC
  Administered 2019-02-21 – 2019-02-23 (×7): 3.375 g via INTRAVENOUS
  Filled 2019-02-21 (×7): qty 50

## 2019-02-21 MED ORDER — SODIUM CHLORIDE 0.9 % IV SOLN: 250.0000 mL | INTRAVENOUS | Status: DC

## 2019-02-21 MED ORDER — CHLORHEXIDINE GLUCONATE 0.12 % MT SOLN
15.0000 mL | Freq: Two times a day (BID) | OROMUCOSAL | Status: DC
  Administered 2019-02-21 – 2019-02-28 (×12): 15 mL via OROMUCOSAL
  Filled 2019-02-21 (×6): qty 15

## 2019-02-21 MED ORDER — VANCOMYCIN HCL 10 G IV SOLR
1500.0000 mg | INTRAVENOUS | Status: DC
  Administered 2019-02-22: 1500 mg via INTRAVENOUS
  Filled 2019-02-21: qty 1500

## 2019-02-21 MED ORDER — CELECOXIB 200 MG PO CAPS: 200.0000 mg | ORAL_CAPSULE | ORAL | Status: DC

## 2019-02-21 MED ORDER — CEFAZOLIN SODIUM-DEXTROSE 2-4 GM/100ML-% IV SOLN: 2.0000 g | INTRAVENOUS | Status: DC

## 2019-02-21 MED ORDER — CHLORHEXIDINE GLUCONATE CLOTH 2 % EX PADS
6.0000 | MEDICATED_PAD | Freq: Every day | CUTANEOUS | Status: DC
  Administered 2019-02-22 – 2019-02-28 (×6): 6 via TOPICAL

## 2019-02-21 MED ORDER — NOREPINEPHRINE 4 MG/250ML-% IV SOLN
0.0000 ug/min | INTRAVENOUS | Status: DC
  Administered 2019-02-21: 2 ug/min via INTRAVENOUS
  Filled 2019-02-21 (×2): qty 250

## 2019-02-21 MED ORDER — DEXTROSE-NACL 5-0.45 % IV SOLN
INTRAVENOUS | Status: DC
  Administered 2019-02-21 – 2019-02-22 (×2): via INTRAVENOUS

## 2019-02-21 NOTE — ED Notes (Signed)
MD notified of BP 63/53 (59). Awaiting orders. Will continue to monitor.

## 2019-02-21 NOTE — ED Notes (Signed)
Unable to perform orthostatic vital signs at this time.

## 2019-02-21 NOTE — ED Notes (Signed)
Pt. Documented in error verify informed consent.

## 2019-02-21 NOTE — TOC Initial Note (Signed)
Transition of Care [facility]) - Initial/Assessment Note    Patient Details  Name: Gloria Murray MRN: [id number] Date of Birth: [birth year]  Transition of Care [facility]) CM/SW Contact:    Nila Nephew, LCSW Phone Number: [phone] 02/21/2019, 2:43 PM  Clinical Narrative:    Completed high readmission risk screening due to score 25%. Pt is getting cancer treatment at [facility], chemotherapy, and admitted with respiratory failure secondary to aspiration pneumonia.  TOC team will follow please consult for disposition needs.         Patient Goals and CMS Choice        Expected Discharge Plan and Services           Expected Discharge Date: (unknown)                                    Prior Living Arrangements/Services                       Activities of Daily Living Home Assistive Devices/Equipment: Eyeglasses, Dentures (specify type)(upper/lower dentures) ADL Screening (condition at time of admission) Patient's cognitive ability adequate to safely complete daily activities?: Yes Is the patient deaf or have difficulty hearing?: No Does the patient have difficulty seeing, even when wearing glasses/contacts?: No Does the patient have difficulty concentrating, remembering, or making decisions?: No Patient able to express need for assistance with ADLs?: Yes Does the patient have difficulty dressing or bathing?: Yes Independently performs ADLs?: No Communication: Independent Is this a change from baseline?: Pre-admission baseline Dressing (OT): Dependent Is this a change from baseline?: Change from baseline, expected to last >3 days Grooming: Dependent Is this a change from baseline?: Change from baseline, expected to last >3 days Feeding: Dependent Is this a change from baseline?: Change from baseline, expected to last >3 days Bathing: Dependent Is this a change from baseline?: Change from baseline, expected to last >3 days Toileting: Dependent Is this a change from  baseline?: Change from baseline, expected to last >3days In/Out Bed: Dependent Is this a change from baseline?: Change from baseline, expected to last >3 days Walks in Home: Dependent Is this a change from baseline?: Change from baseline, expected to last >3 days Does the patient have difficulty walking or climbing stairs?: Yes(secondary to weakness) Weakness of Legs: Both Weakness of Arms/Hands: Both  Permission Sought/Granted                  Emotional Assessment              Admission diagnosis:  Malignant neoplasm of supraglottis ([place]) [C32.1] Hypokalemia [E87.6] Pneumonia [J18.9] Weakness [R53.1] Hypothermia, initial encounter [T68.XXXA] Septic shock ([place]) [A41.9, R65.21] Patient Active Problem List   Diagnosis Date Noted  . Septic shock ([place]) 02/21/2019  . Hypothermia 02/05/2019  . Pancytopenia ([place]) 02/26/2019  . Hypotension 02/15/2019  . Hypokalemia 02/06/2019  . Oral candidiasis 02/06/2019  . Anemia due to antineoplastic chemotherapy 01/23/2019  . Chemotherapy-induced thrombocytopenia 01/16/2019  . Chemotherapy induced nausea and vomiting 01/02/2019  . Protein malnutrition ([place]) 01/02/2019  . Leukopenia due to antineoplastic chemotherapy ([place]) 12/26/2018  . Port-A-Cath in place 12/19/2018  . Hypomagnesemia 12/19/2018  . Malignant neoplasm of supraglottis ([place]) 11/16/2018  . Osteoporosis 07/07/2017  . Seasonal allergies 07/07/2017  . Carotid stenosis 10/14/2014  . Hypothyroidism 01/04/2007  . HYPERLIPIDEMIA, MIXED, MILD 01/04/2007  . HYPERTENSION, BENIGN ESSENTIAL 01/04/2007   PCP:  Jearld Fenton,  NP Pharmacy:   [facility] [address]), [place] - [address] [place] [address] Phone: [phone] Fax: [place] [facility] [place], [place] [address] Phone: [phone] Fax: [phone]  [place], [place] [address] Phone: [phone] Fax: [phone]     Social Determinants of Health (SDOH) Interventions    Readmission Risk Interventions Readmission Risk Prevention Plan 02/21/2019  Transportation Screening Complete  PCP or Specialist Appt within 3-5 Days Complete  HRI or [place] (No Data)  Social Work Consult for [place] Planning/Counseling (No Data)  Palliative Care Screening Not Applicable  Medication Review Press photographer) Not Complete  Med Review Comments per admission summary PTA medications need review  Some recent data might be hidden

## 2019-02-21 NOTE — ED Notes (Signed)
ED TO INPATIENT HANDOFF REPORT  ED Nurse Name and Phone #: Ander Purpura Cicero  S Name/Age/Gender Gloria Murray 68 y.o. female Room/Bed: WA22/WA22  Code Status   Code Status: Full Code  Home/SNF/Other Home Patient oriented to: self, place, time and situation Is this baseline? Yes   Triage Complete: Triage complete  Chief Complaint Weakness  Triage Note She has hx of throat cancer. She phoned EMS because she fell onto her floor and was simply too weak to get up. She arrives here in no distress.   Allergies Allergies  Allergen Reactions  . Propoxyphene Hcl Itching and Nausea Only    REACTION: nausea and itch  . Propoxyphene N-Acetaminophen Itching and Nausea Only    REACTION: nausea and  itch    Level of Care/Admitting Diagnosis ED Disposition    ED Disposition Condition [facility] Area: [place] [[id number]]  Level of Care: ICU [6]  Covid Evaluation: Confirmed COVID Negative  Diagnosis: Septic shock [facility]) [[id number]]  Admitting Physician: Aldean Jewett [[id number]]  Attending Physician: Aldean Jewett [phone]  Estimated length of stay: 5 - 7 days  Certification:: I certify this patient will need inpatient services for at least 2 midnights  PT Class (Do Not Modify): Inpatient [101]  PT Acc Code (Do Not Modify): Private [1]       B Medical/Surgery History Past Medical History:  Diagnosis Date  . Allergy   . Carotid artery occlusion   . Heart murmur   . Hyperlipidemia   . Hypertension   . Hypothyroidism   . Laryngeal mass   . Osteoporosis   . Thyroid disease   . Wears glasses   . Wears upper complete and lower partial dentures    Past Surgical History:  Procedure Laterality Date  . ARCH AORTOGRAM N/A 07/23/2014   Procedure: ARCH AORTOGRAM;  Surgeon: Rosetta Posner, MD;  Location: [facility] CATH LAB;  Service: Cardiovascular;  Laterality: N/A;  . CAROTID ANGIOGRAM N/A 07/23/2014   Procedure: CAROTID ANGIOGRAM;  Surgeon: Rosetta Posner, MD;  Location: [facility] CATH LAB;  Service: Cardiovascular;  Laterality: N/A;  . CAROTID ENDARTERECTOMY  12/11/2008   left  . CAROTID STENT INSERTION Left 10/14/2014   Procedure: CAROTID STENT INSERTION;  Surgeon: Serafina Mitchell, MD;  Location: [facility] CATH LAB;  Service: Cardiovascular;  Laterality: Left;   carotid  . CARPAL TUNNEL RELEASE     X 2, right wrist  . COLONOSCOPY  Jan. 2, 2015  . DILATION AND CURETTAGE OF UTERUS     X 2  . IR IMAGING GUIDED PORT INSERTION  12/06/2018  . laceration hand Right    hand  . LARYNGOSCOPY N/A 11/08/2018   Procedure: DIRECT MICROLARYNGOSCOPY WITH BIOPSY;  Surgeon: Melida Quitter, MD;  Location: [place];  Service: ENT;  Laterality: N/A;  . RIGID ESOPHAGOSCOPY N/A 11/08/2018   Procedure: ESOPHAGOSCOPY;  Surgeon: Melida Quitter, MD;  Location: [facility];  Service: ENT;  Laterality: N/A;  . THYROIDECTOMY    . TUBAL LIGATION  1975   bilateral     A IV Location/Drains/Wounds Patient Lines/Drains/Airways Status   Active Line/Drains/Airways    Name:   Placement date:   Placement time:   Site:   Days:   Implanted Port 12/06/18 Right Chest   12/06/18    1509    Chest   77   Urethral Catheter Terri Doster RN Temperature probe 14 Fr.   02/14/2019    2032  Temperature probe   1          Intake/Output Last 24 hours  Intake/Output Summary (Last 24 hours) at 02/21/2019 1340 Last data filed at 02/21/2019 1140 Gross per 24 hour  Intake 6656 ml  Output 625 ml  Net 6031 ml    Labs/Imaging Results for orders placed or performed during the hospital encounter of 02/04/2019 (from the past 48 hour(s))  CBC with Differential     Status: Abnormal   Collection Time: 02/09/2019  5:18 PM  Result Value Ref Range   WBC 2.8 (L) 4.0 - 10.5 K/uL   RBC 3.28 (L) 3.87 - 5.11 MIL/uL   Hemoglobin 10.5 (L) 12.0 - 15.0 g/dL   HCT 30.3 (L) 36.0 - 46.0 %   MCV 92.4 80.0 - 100.0 fL   MCH 32.0 26.0 - 34.0 pg   MCHC 34.7 30.0 - 36.0 g/dL   RDW 15.1 11.5 - 15.5 %   Platelets 79 (L) 150 - 400  K/uL    Comment: REPEATED TO VERIFY PLATELET COUNT CONFIRMED BY SMEAR SPECIMEN CHECKED FOR CLOTS Immature Platelet Fraction may be clinically indicated, consider ordering this additional test [id number]    nRBC 0.7 (H) 0.0 - 0.2 %   Neutrophils Relative % 86 %   Neutro Abs 2.5 1.7 - 7.7 K/uL   Lymphocytes Relative 4 %   Lymphs Abs 0.1 (L) 0.7 - 4.0 K/uL   Monocytes Relative 9 %   Monocytes Absolute 0.2 0.1 - 1.0 K/uL   Eosinophils Relative 0 %   Eosinophils Absolute 0.0 0.0 - 0.5 K/uL   Basophils Relative 0 %   Basophils Absolute 0.0 0.0 - 0.1 K/uL   WBC Morphology INCREASED BANDS (>20% BANDS)    Immature Granulocytes 1 %   Abs Immature Granulocytes 0.02 0.00 - 0.07 K/uL    Comment: Performed at [facility], [place] [address] [place], [place] [address]  Comprehensive metabolic panel     Status: Abnormal   Collection Time: 02/28/2019  5:18 PM  Result Value Ref Range   Sodium 146 (H) 135 - 145 mmol/L   Potassium 2.9 (L) 3.5 - 5.1 mmol/L   Chloride 99 98 - 111 mmol/L   CO2 31 22 - 32 mmol/L   Glucose, Bld 120 (H) 70 - 99 mg/dL   BUN 44 (H) 8 - 23 mg/dL   Creatinine, Ser 0.85 0.44 - 1.00 mg/dL   Calcium 9.9 8.9 - 10.3 mg/dL   Total Protein 6.5 6.5 - 8.1 g/dL   Albumin 3.8 3.5 - 5.0 g/dL   AST 21 15 - 41 U/L   ALT 29 0 - 44 U/L   Alkaline Phosphatase 45 38 - 126 U/L   Total Bilirubin 1.5 (H) 0.3 - 1.2 mg/dL   GFR calc non Af Amer >60 >60 mL/min   GFR calc Af Amer >60 >60 mL/min   Anion gap 16 (H) 5 - 15    Comment: Performed at [facility], [place] [address], [place], [place] [address]  Lactic acid, plasma     Status: None   Collection Time: 02/09/2019  5:19 PM  Result Value Ref Range   Lactic Acid, Venous 1.3 0.5 - 1.9 mmol/L    Comment: Performed at [facility], [place] [address] [place], [place] [address]  Blood culture (routine x 2)     Status: None (Preliminary result)   Collection Time: 02/19/2019  6:02 PM   Specimen:  BLOOD  Result Value Ref  Range   Specimen Description      BLOOD RIGHT ANTECUBITAL Performed at [place] [address], [place], [place] [address]    Special Requests      BOTTLES DRAWN AEROBIC AND ANAEROBIC Blood Culture adequate volume Performed at [place] [address] [place], [place] [address]    Culture      NO GROWTH < 12 HOURS Performed at [place] [address], [place], [place] [address]    Report Status PENDING   Blood culture (routine x 2)     Status: None (Preliminary result)   Collection Time: 02/25/2019  6:02 PM   Specimen: BLOOD  Result Value Ref Range   Specimen Description      BLOOD RIGHT CHEST Performed at [place] [address], [place], [place] [address]    Special Requests      BOTTLES DRAWN AEROBIC AND ANAEROBIC Blood Culture adequate volume Performed at [place] [address], [place], [place] [address]    Culture      NO GROWTH < 12 HOURS Performed at [place] [address] [place], [place] [address]    Report Status PENDING   Magnesium     Status: None   Collection Time: 02/14/2019  6:02 PM  Result Value Ref Range   Magnesium 2.1 1.7 - 2.4 mg/dL    Comment: Performed at [facility], [place] [address] [place], [place] [address]  Lactic acid, plasma     Status: None   Collection Time: 02/13/2019  7:40 PM  Result Value Ref Range   Lactic Acid, Venous 1.0 0.5 - 1.9 mmol/L    Comment: Performed at [facility], [place] [address] [place], [place] [address]  Urinalysis, Routine w reflex microscopic     Status: Abnormal   Collection Time: 02/10/2019  8:44 PM  Result Value Ref Range   Color, Urine AMBER (A) YELLOW    Comment: BIOCHEMICALS MAY BE AFFECTED BY COLOR   APPearance CLEAR CLEAR   Specific Gravity, Urine 1.020 1.005 - 1.030   pH 5.0 5.0 - 8.0   Glucose, UA NEGATIVE NEGATIVE mg/dL   Hgb  urine dipstick NEGATIVE NEGATIVE   Bilirubin Urine SMALL (A) NEGATIVE   Ketones, ur 5 (A) NEGATIVE mg/dL   Protein, ur NEGATIVE NEGATIVE mg/dL   Nitrite NEGATIVE NEGATIVE   Leukocytes,Ua NEGATIVE NEGATIVE    Comment: Performed at [facility] [address], [place], [place] [address]  SARS Coronavirus 2 (CEPHEID - Performed in [place] hospital lab), Hosp Order     Status: None   Collection Time: 02/03/2019  8:59 PM   Specimen: Nasopharyngeal Swab  Result Value Ref Range   SARS Coronavirus 2 NEGATIVE NEGATIVE    Comment: (NOTE) If result is NEGATIVE SARS-CoV-2 target nucleic acids are NOT DETECTED. The SARS-CoV-2 RNA is generally detectable in upper and lower  respiratory specimens during the acute phase of infection. The lowest  concentration of SARS-CoV-2 viral copies this assay can detect is 250  copies / mL. A negative result does not preclude SARS-CoV-2 infection  and should not be used as the sole basis for treatment or other  patient management decisions.  A negative result may occur with  improper specimen collection / handling, submission of specimen other  than nasopharyngeal swab, presence of viral mutation(s) within the  areas targeted by this assay, and inadequate number of viral copies  (<  250 copies / mL). A negative result must be combined with clinical  observations, patient history, and epidemiological information. If result is POSITIVE SARS-CoV-2 target nucleic acids are DETECTED. The SARS-CoV-2 RNA is generally detectable in upper and lower  respiratory specimens dur ing the acute phase of infection.  Positive  results are indicative of active infection with SARS-CoV-2.  Clinical  correlation with patient history and other diagnostic information is  necessary to determine patient infection status.  Positive results do  not rule out bacterial infection or co-infection with other viruses. If result is PRESUMPTIVE POSTIVE SARS-CoV-2 nucleic  acids MAY BE PRESENT.   A presumptive positive result was obtained on the submitted specimen  and confirmed on repeat testing.  While 2019 novel coronavirus  (SARS-CoV-2) nucleic acids may be present in the submitted sample  additional confirmatory testing may be necessary for epidemiological  and / or clinical management purposes  to differentiate between  SARS-CoV-2 and other Sarbecovirus currently known to infect humans.  If clinically indicated additional testing with an alternate test  methodology [phone]) is advised. The SARS-CoV-2 RNA is generally  detectable in upper and lower respiratory sp ecimens during the acute  phase of infection. The expected result is Negative. Fact Sheet for Patients:  StrictlyIdeas.no Fact Sheet for Healthcare Providers: BankingDealers.co.za This test is not yet approved or cleared by the [place] FDA and has been authorized for detection and/or diagnosis of SARS-CoV-2 by FDA under an Emergency Use Authorization (EUA).  This EUA will remain in effect (meaning this test can be used) for the duration of the COVID-19 declaration under Section 564(b)(1) of the Act, 21 U.S.C. section 360bbb-3(b)(1), unless the authorization is terminated or revoked sooner. Performed at [facility], [place] [address] [place], [place] [address]   TSH     Status: Abnormal   Collection Time: 02/24/2019 11:28 PM  Result Value Ref Range   TSH 20.091 (H) 0.350 - 4.500 uIU/mL    Comment: Performed by a 3rd Generation assay with a functional sensitivity of <=0.01 uIU/mL. Performed at [facility], [place] [address] [place], [place] [address]   Cortisol     Status: None   Collection Time: 02/18/2019 11:28 PM  Result Value Ref Range   Cortisol, Plasma 31.9 ug/dL    Comment: (NOTE) AM    6.7 - 22.6 ug/dL PM   <10.0       ug/dL Performed at [place] [address] [place],  [place] [address]   CBC     Status: Abnormal   Collection Time: 02/21/19  5:10 AM  Result Value Ref Range   WBC 5.9 4.0 - 10.5 K/uL   RBC 3.00 (L) 3.87 - 5.11 MIL/uL   Hemoglobin 9.5 (L) 12.0 - 15.0 g/dL   HCT 28.7 (L) 36.0 - 46.0 %   MCV 95.7 80.0 - 100.0 fL   MCH 31.7 26.0 - 34.0 pg   MCHC 33.1 30.0 - 36.0 g/dL   RDW 15.7 (H) 11.5 - 15.5 %   Platelets 110 (L) 150 - 400 K/uL    Comment: REPEATED TO VERIFY Immature Platelet Fraction may be clinically indicated, consider ordering this additional test [id number] CONSISTENT WITH PREVIOUS RESULT    nRBC 1.5 (H) 0.0 - 0.2 %    Comment: Performed at [facility], [place] [address] [place], [place] [address]  Basic metabolic panel     Status: Abnormal   Collection Time: 02/21/19  5:10 AM  Result  Value Ref Range   Sodium 148 (H) 135 - 145 mmol/L   Potassium 3.4 (L) 3.5 - 5.1 mmol/L   Chloride 108 98 - 111 mmol/L   CO2 23 22 - 32 mmol/L   Glucose, Bld 122 (H) 70 - 99 mg/dL   BUN 30 (H) 8 - 23 mg/dL   Creatinine, Ser 0.73 0.44 - 1.00 mg/dL   Calcium 8.6 (L) 8.9 - 10.3 mg/dL   GFR calc non Af Amer >60 >60 mL/min   GFR calc Af Amer >60 >60 mL/min   Anion gap 17 (H) 5 - 15    Comment: Performed at [facility], [place] [address] [place], [place] [address]  Magnesium     Status: None   Collection Time: 02/21/19  5:10 AM  Result Value Ref Range   Magnesium 2.1 1.7 - 2.4 mg/dL    Comment: Performed at [facility], [place] [address], [place], [place] [address]  Cortisol-am, blood     Status: None   Collection Time: 02/21/19  5:10 AM  Result Value Ref Range   Cortisol - AM 15.5 6.7 - 22.6 ug/dL    Comment: Performed at [facility], [place] [address], [place],  [address]   Dg Chest Port 1 View  Result Date: 02/06/2019 CLINICAL DATA:  Weakness.  Fall. EXAM: PORTABLE CHEST 1 VIEW COMPARISON:  PET-CT dated November 23, 2018. Chest x-ray dated Dec 10, 2008. FINDINGS: Right chest wall port  catheter with tip at the cavoatrial junction. The heart size and mediastinal contours are within normal limits. Atherosclerotic calcification of the aortic arch. Normal pulmonary vascularity. No focal consolidation, pleural effusion, or pneumothorax. No acute osseous abnormality. Left internal carotid artery stent. IMPRESSION: No active disease. Electronically Signed   By: Titus Dubin M.D.   On: 02/18/2019 17:43    Pending Labs Unresulted Labs (From admission, onward)    Start     Ordered   02/22/19 0500  Creatinine, serum  Daily,   R     02/21/19 0217   02/22/19 0500  CBC  Tomorrow morning,   R     02/21/19 1032   02/22/19 0500  Comprehensive metabolic panel  Tomorrow morning,   R     02/21/19 1032   02/21/19 0218  Novel Coronavirus, NAA (hospital order; send-out to ref lab)  Once,   STAT    Comments: No isolation needed for this testing (if isolation ordered for another indication, maintain current isolation).   Question:  Required for:  Answer:  Pre-procedural testing   02/21/19 0218   02/27/2019 2328  ACTH  Once,   STAT     02/28/2019 2327   02/01/2019 2328  Aldosterone + renin activity w/ ratio  Once,   STAT     02/21/2019 2327   02/12/2019 2111  HIV Antibody (routine testing w rflx)  Once,   STAT     02/04/2019 2111          Vitals/Pain Today's Vitals   02/21/19 1145 02/21/19 1230 02/21/19 1300 02/21/19 1325  BP: 107/72 106/73 109/74 112/74  Pulse: (!) 106 (!) 109 (!) 110 (!) 109  Resp: (!) 25 (!) 25 (!) 24 (!) 25  Temp: 99.1 F (37.3 C) 99 F (37.2 C) 99.3 F (37.4 C) 99.1 F (37.3 C)  TempSrc:      SpO2: 99% 100% 99% 100%    Isolation Precautions No active isolations  Medications Medications  potassium chloride 10 mEq in 100  mL IVPB (0 mEq Intravenous Stopped 02/21/19 0014)  sodium chloride flush (NS) 0.9 % injection 3 mL (3 mLs Intravenous Given 02/21/19 1141)  acetaminophen (TYLENOL) tablet 650 mg (has no administration in time range)    Or  acetaminophen (TYLENOL)  suppository 650 mg (has no administration in time range)  ondansetron (ZOFRAN) tablet 4 mg (has no administration in time range)    Or  ondansetron (ZOFRAN) injection 4 mg (has no administration in time range)  potassium chloride 20 MEQ/15ML (10%) solution 40 mEq (0 mEq Oral Hold 02/22/2019 2200)  lidocaine (XYLOCAINE) 2 % viscous mouth solution 15 mL (has no administration in time range)  simvastatin (ZOCOR) tablet 40 mg (0 mg Oral Hold 02/14/2019 2200)  aspirin EC tablet 81 mg (0 mg Oral Hold 02/21/19 1024)  clopidogrel (PLAVIX) tablet 75 mg (0 mg Oral Hold 02/21/19 1025)  vancomycin (VANCOCIN) 1,500 mg in sodium chloride 0.9 % 500 mL IVPB (has no administration in time range)  hydrocortisone sodium succinate (SOLU-CORTEF) 100 MG injection 50 mg (50 mg Intravenous Given 02/21/19 1206)  0.9 %  sodium chloride infusion (0 mLs Intravenous Hold 02/21/19 0901)  norepinephrine (LEVOPHED) 4mg  in 231mL premix infusion (0 mcg/min Intravenous Stopped 02/21/19 1138)  piperacillin-tazobactam (ZOSYN) IVPB 3.375 g (0 g Intravenous Stopped 02/21/19 0644)  Chlorhexidine Gluconate Cloth 2 % PADS 6 each (0 each Topical Hold 02/21/19 0904)    And  Chlorhexidine Gluconate Cloth 2 % PADS 6 each (0 each Topical Hold 02/21/19 0905)  acetaminophen (TYLENOL) tablet 1,000 mg (0 mg Oral Hold 02/21/19 0906)  gabapentin (NEURONTIN) capsule 300 mg (0 mg Oral Hold 02/21/19 0907)  levothyroxine (SYNTHROID, LEVOTHROID) injection 56 mcg (56 mcg Intravenous Given 02/21/19 1142)  dextrose 5 %-0.45 % sodium chloride infusion ( Intravenous New Bag/Given 02/21/19 1147)  sodium chloride 0.9 % bolus 1,000 mL (0 mLs Intravenous Stopped 02/24/2019 1900)  sodium chloride 0.9 % bolus 1,000 mL (0 mLs Intravenous Stopped 02/16/2019 2101)  magnesium sulfate IVPB 2 g 50 mL (0 g Intravenous Stopped 02/15/2019 2220)  sodium chloride 0.9 % bolus 1,000 mL (0 mLs Intravenous Stopped 01/30/2019 2220)  sodium chloride 0.9 % bolus 500 mL (0 mLs Intravenous Stopped 02/25/2019  2340)  vancomycin (VANCOCIN) 1,750 mg in sodium chloride 0.9 % 500 mL IVPB (0 mg Intravenous Stopped 02/21/19 0620)  potassium chloride 10 mEq in 100 mL IVPB (0 mEq Intravenous Stopped 02/21/19 0818)    Mobility walks with device High fall risk   Focused Assessments Respiratory. Brown Sputum.    R Recommendations: See Admitting Provider Note  Report given to:   Additional Notes:

## 2019-02-21 NOTE — ED Notes (Addendum)
This Probation officer attempted to contact admitting provider Wael MD about titration of levophed drip. Patient VS has Map above 87-57 and Systolic of [phone].This Probation officer didn't receive a response from provider. This Probation officer proceeded to Pensions consultant. The pharmacy advised for this writer to leave drip at lowest range per parameters. This Probation officer was given MD Elsworth Soho to consult with. This Probation officer is waiting for a response.

## 2019-02-21 NOTE — ED Notes (Signed)
02/16/2019 2030-02/21/19 0100 Urine Output 240mL

## 2019-02-21 NOTE — ED Notes (Signed)
0100-0700 Pt output 275mL

## 2019-02-21 NOTE — ED Notes (Signed)
Critical Care MD at bedside.  

## 2019-02-21 NOTE — ED Notes (Signed)
Advised by Laurey Arrow NP to titrate Levophed down if patient continues to remain stable. Titration down will occur every 15 minutes. Advised by Laurey Arrow NP to hold PO medications due to risk of aspiration.

## 2019-02-21 NOTE — Progress Notes (Signed)
PHARMACY - PHYSICIAN COMMUNICATION CRITICAL VALUE ALERT - BLOOD CULTURE IDENTIFICATION (BCID)  Gloria Murray is an 68 y.o. female who presented to [facility] on 02/01/2019 with a chief complaint of hypotension  Assessment:  Strep spp in 1/2 BCx (source unclear, possible aspiration but CXR clear)  Name of physician (or Provider) Contacted: Elsworth Soho  Current antibiotics: Vanc/Zosyn  Changes to prescribed antibiotics recommended: could be contam or real, but without clear source would hold off on narrowing. Could probably narrow vanc if PCR comes back negative however. Patient is on recommended antibiotics - No changes needed  Results for orders placed or performed during the hospital encounter of 02/02/2019  Blood Culture ID Panel (Reflexed) (Collected: 02/10/2019  6:02 PM)  Result Value Ref Range   Enterococcus species NOT DETECTED NOT DETECTED   Listeria monocytogenes NOT DETECTED NOT DETECTED   Staphylococcus species NOT DETECTED NOT DETECTED   Staphylococcus aureus (BCID) NOT DETECTED NOT DETECTED   Streptococcus species DETECTED (A) NOT DETECTED   Streptococcus agalactiae NOT DETECTED NOT DETECTED   Streptococcus pneumoniae NOT DETECTED NOT DETECTED   Streptococcus pyogenes NOT DETECTED NOT DETECTED   Acinetobacter baumannii NOT DETECTED NOT DETECTED   Enterobacteriaceae species NOT DETECTED NOT DETECTED   Enterobacter cloacae complex NOT DETECTED NOT DETECTED   Escherichia coli NOT DETECTED NOT DETECTED   Klebsiella oxytoca NOT DETECTED NOT DETECTED   Klebsiella pneumoniae NOT DETECTED NOT DETECTED   Proteus species NOT DETECTED NOT DETECTED   Serratia marcescens NOT DETECTED NOT DETECTED   Haemophilus influenzae NOT DETECTED NOT DETECTED   Neisseria meningitidis NOT DETECTED NOT DETECTED   Pseudomonas aeruginosa NOT DETECTED NOT DETECTED   Candida albicans NOT DETECTED NOT DETECTED   Candida glabrata NOT DETECTED NOT DETECTED   Candida krusei NOT DETECTED NOT DETECTED   Candida  parapsilosis NOT DETECTED NOT DETECTED   Candida tropicalis NOT DETECTED NOT [place], PharmD, BCPS [phone] 02/21/2019, 4:34 PM

## 2019-02-21 NOTE — Progress Notes (Signed)
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 -stage IVB(she is s/p 5 cycles of carboplatin & xrt>tolerated poorly) -declined feeding tube   SIGNIFICANT PAST MEDICAL HISTORY   Laryngeal cancer, involving epiglottis currently undergoing chemo and radiation therapy, carotid artery occlusion, heart murmur, hypothyroidism, osteoporosis, hyperlipidemia,  SIGNIFICANT EVENTS:   STUDIES:   Chest x-ray 02/17/2019 no pulmonary filtrate CULTURES:  Blood culture>>  ANTIBIOTICS:  Cefepime Zosyn 02/21/2019 Vancomycin 02/21/2019  LINES/TUBES:   Port-A-Cath CONSULTANTS:    SUBJECTIVE:    CONSTITUTIONAL: Blood Pressure 129/86   Pulse 94   Temperature 99.1 F (37.3 C)   Respiration 20   Oxygen Saturation 100%   I/O last 3 completed shifts: In: 5606 [I.V.:106; IV Piggyback:5500] Out: 625 [Urine:625]        PHYSICAL EXAM: General: This is a chronically ill 68 year old female who is resting in bed.  She appears much older than stated age, however is in no acute distress currently HEENT normocephalic atraumatic no jugular venous distention is appreciated her mucous membranes are moist sclera are nonicteric she does have rhonchorous upper airway sounds with rhonchus verbalization. Pulmonary: Diffuse rhonchi no accessory use. Cardiac: Regular rate and rhythm Abdomen: Soft not tender no organomegaly Extremities: Warm and dry diffuse ecchymosis over essentially all upper and lower extremities Neuro: Awake oriented no focal deficits  RESOLVED PROBLEM LIST   ASSESSMENT AND PLAN   Acute hypoxic respiratory failure in setting of aspiration PNA Pcxr: no clear JVD. No clear  infiltrate.  Plan Cont empiric zosyn day 1 Repeat pcxr  Supplemental oxygen pulm hygiene  Aspiration precautions  Needs SLP eval   Septic shock unknown source possible aspiration -pressor requirements improving Plan Cont to wean pressors  Add MIVFs  abx as above  Presumed adrenal insufficiency Plan Stress dose steroids  Fluid and electrolyte imbalance: hypernatremia, hypokalemia  Plan D51/2 ns at 75 Replace K Repeat chem later today  Thrombocytopenia/pancytopenia Plan Trend cbc Hold [facility]  Hypothyroidism  Plan Cont replacement   Recurrent vomiting Plan Cont PRNs  Squamous cell carcinoma of the epiglottis on chemotherapy/radiation Plan F/u outpt     Best Practice / Goals of Care / Disposition.   DVT PROPHYLAXIS: SCDs SUP: None NUTRITION: N.p.o. MOBILITY: Bedrest GOALS OF CARE: Full code FAMILY DISCUSSIONS: No family available DISPOSITION admit to ICU. Weaning pressors   Erick Colace ACNP-BC [place] Pager # [phone] OR # [phone] if no answer

## 2019-02-21 NOTE — Progress Notes (Signed)
eLink Physician-Brief Progress Note Patient Name: Gloria Murray DOB: 05/17/1951 MRN: [id number]   Date of Service  02/21/2019  HPI/Events of Note  A 68 year old female with a history of stage IV squamous cell laryngeal cancer with chemotherapy 1 month back presented to the ER with hypotension.  eICU Interventions  She already received 3.5 L of IV fluids, but still blood pressure is 63/53.  Recommended to start Levophed and transfer to the ICU.  Ordered vancomycin and cefepime.  Blood culture pending.  Patient is hypothermic, cortisol and TSH pending.  Notified Dr. Reeves Forth.      Intervention Category Major Interventions: Sepsis - evaluation and management Intermediate Interventions: Communication with other healthcare providers and/or family Evaluation Type: New Patient Evaluation  [name] 02/21/2019, 1:32 AM

## 2019-02-21 NOTE — ED Notes (Signed)
Pt vomited moderate amount, brown in color.

## 2019-02-21 NOTE — ED Notes (Signed)
Pt moved to hospital bed for comfort. Pt is being held in ED.

## 2019-02-21 NOTE — Consult Note (Addendum)
PULMONARY / CRITICAL CARE MEDICINE   NAME:  Gloria Murray, MRN:  [id number], DOB:  10-17-1950, LOS: 0 ADMISSION DATE:  02/14/2019, CONSULTATION DATE: 02/21/2019 REFERRING MD: ED physician cHIEF COMPLAINT: Fall  BRIEF HISTORY:    68 year old lady with squamous cell carcinoma of the epiglottis coming in with hypotension and septic shock requiring vasopressors.   HISTORY OF PRESENT ILLNESS   [name] is a 68 year old lady with history of squamous cell carcinoma of the epiglottis undergoing chemotherapy and radiation coming in after a fall at home and was found to be hypotensive in the emergency room received 3.5 L of IV fluids remained hypotensive and was started on Levophed drip.  Patient is a very poor historian she is also agitated and annoyed denies any fever no chills or rigors.  Patient does complain of recurrent vomiting denies any abdominal pain.  Denies any shortness of breath.  Patient COVID-19 test was negative.  Patient was admitted under hospitalist service but remained in the emergency room due to no bed continued to drop her blood pressure so we were called for further management.   SIGNIFICANT PAST MEDICAL HISTORY    . Allergy   . Carotid artery occlusion   . Heart murmur   . Hyperlipidemia   . Hypertension   . Hypothyroidism   . Laryngeal mass   . Osteoporosis   . Thyroid disease   . Wears glasses   . Wears upper complete and lower partial dentures          Past Surgical History:  Procedure Laterality Date  . ARCH AORTOGRAM N/A 07/23/2014   Procedure: ARCH AORTOGRAM;  Surgeon: Rosetta Posner, MD;  Location: [facility] CATH LAB;  Service: Cardiovascular;  Laterality: N/A;  . CAROTID ANGIOGRAM N/A 07/23/2014   Procedure: CAROTID ANGIOGRAM;  Surgeon: Rosetta Posner, MD;  Location: [facility] CATH LAB;  Service: Cardiovascular;  Laterality: N/A;  . CAROTID ENDARTERECTOMY  12/11/2008   left  . CAROTID STENT INSERTION Left 10/14/2014   Procedure: CAROTID STENT INSERTION;   Surgeon: Serafina Mitchell, MD;  Location: [facility] CATH LAB;  Service: Cardiovascular;  Laterality: Left;   carotid  . CARPAL TUNNEL RELEASE     X 2, right wrist  . COLONOSCOPY  Jan. 2, 2015  . DILATION AND CURETTAGE OF UTERUS     X 2  . IR IMAGING GUIDED PORT INSERTION  12/06/2018  . laceration hand Right    hand  . LARYNGOSCOPY N/A 11/08/2018   Procedure: DIRECT MICROLARYNGOSCOPY WITH BIOPSY;  Surgeon: Melida Quitter, MD;  Location: [place];  Service: ENT;  Laterality: N/A;  . RIGID ESOPHAGOSCOPY N/A 11/08/2018   Procedure: ESOPHAGOSCOPY;  Surgeon: Melida Quitter, MD;  Location: [place];  Service: ENT;  Laterality: N/A;  . THYROIDECTOMY    . TUBAL LIGATION  1975   bilateral     SIGNIFICANT EVENTS:   STUDIES:   Chest x-ray 02/08/2019 no pulmonary filtrate CULTURES:  Blood culture>>  ANTIBIOTICS:  Cefepime Zosyn 02/21/2019 Vancomycin 02/21/2019  LINES/TUBES:   Port-A-Cath CONSULTANTS:    SUBJECTIVE:    CONSTITUTIONAL: BP 101/83   Pulse (!) 108   Temp 99 F (37.2 C)   Resp 20   SpO2 97%   I/O last 3 completed shifts: In: 1000 [IV Piggyback:1000] Out: -         PHYSICAL EXAM: General: Looks acutely ill Neuro: Awake alert oriented time place and person HEENT: Dry Cardiovascular: Normal heart sound no sounds or murmurs Lungs: Diminished air sounds bilaterally with  few basal crackles Abdomen: Soft no tenderness no guarding Musculoskeletal: No lower edema Skin: Dry skin  RESOLVED PROBLEM LIST   ASSESSMENT AND PLAN   Assessment: -Septic shock unknown source possible aspiration -Recurrent vomiting -Squamous cell carcinoma of the epiglottis on chemotherapy/radiation -Suspected adrenal insufficiency -Hypernatremia -Pancytopenia -Hypokalemia   Plan: -We appreciate the consult -Patient will be admitted to the intensive care unit for further management -I will switch cefepime to Zosyn since patient had recurrent vomiting and high risk for aspiration  despite chest x-ray showing no infiltrates. -Continue on vancomycin -Follow culture results -Transfuse packed RBC if hemoglobin less than 7 -Transfuse platelets if less than 10 or actively bleeding -Titrate Levophed to keep map above 65 -I will change Decadron to IV hydrocortisone 50 mg every 6 -Patient did receive 3-1/2 L of IV fluids and she sounds wet we will be judicious with IV fluid and try to titrate her pressors -Replace potassium -SCDs for DVT prophylaxis -N.p.o. -Resume home thyroxine   I have spent 45 minutes of critical care time this time was spent at bedside or in the unit this time was exclusive of any billable procedures patient is needing intensive care due to septic shock requiring vasopressors.   SUMMARY OF TODAY'S PLAN:    Best Practice / Goals of Care / Disposition.   DVT PROPHYLAXIS: SCDs SUP: None NUTRITION: N.p.o. MOBILITY: Bedrest GOALS OF CARE: Full code FAMILY DISCUSSIONS: No family available DISPOSITION ICU admission  LABS  Glucose No results for input(s): GLUCAP in the last 168 hours.  BMET Recent Labs  Lab 02/17/2019 1718  NA 146*  K 2.9*  CL 99  CO2 31  BUN 44*  CREATININE 0.85  GLUCOSE 120*    Liver Enzymes Recent Labs  Lab 02/14/2019 1718  AST 21  ALT 29  ALKPHOS 45  BILITOT 1.5*  ALBUMIN 3.8    Electrolytes Recent Labs  Lab 01/31/2019 1718 02/14/2019 1802  CALCIUM 9.9  --   MG  --  2.1    CBC Recent Labs  Lab 02/12/2019 1718  WBC 2.8*  HGB 10.5*  HCT 30.3*  PLT 79*    ABG No results for input(s): PHART, PCO2ART, PO2ART in the last 168 hours.  Coag's No results for input(s): APTT, INR in the last 168 hours.  Sepsis Markers Recent Labs  Lab 02/11/2019 1719 02/18/2019 1940  LATICACIDVEN 1.3 1.0    Cardiac Enzymes No results for input(s): TROPONINI, PROBNP in the last 168 hours.  PAST MEDICAL HISTORY :   She  has a past medical history of Allergy, Carotid artery occlusion, Heart murmur, Hyperlipidemia,  Hypertension, Hypothyroidism, Laryngeal mass, Osteoporosis, Thyroid disease, Wears glasses, and Wears upper complete and lower partial dentures.  PAST SURGICAL HISTORY:  She  has a past surgical history that includes Thyroidectomy; Tubal ligation (1975); Dilation and curettage of uterus; Carpal tunnel release; Carotid endarterectomy (12/11/2008); laceration hand (Right); Colonoscopy (Jan. 2, 2015); carotid angiogram (N/A, 07/23/2014); arch aortogram (N/A, 07/23/2014); carotid stent insertion (Left, 10/14/2014); Laryngoscopy (N/A, 11/08/2018); Rigid esophagoscopy (N/A, 11/08/2018); and IR IMAGING GUIDED PORT INSERTION (12/06/2018).  Allergies  Allergen Reactions  . Propoxyphene Hcl Itching and Nausea Only    REACTION: nausea and itch  . Propoxyphene N-Acetaminophen Itching and Nausea Only    REACTION: nausea and  itch    No current facility-administered medications on file prior to encounter.    Current Outpatient Medications on File Prior to Encounter  Medication Sig  . Ascorbic Acid (VITAMIN C PO) Take 1 tablet by  mouth daily.  Marland Kitchen aspirin EC 81 MG tablet Take 81 mg by mouth daily.  Marland Kitchen CALCIUM PO Take 1 tablet by mouth daily.  . Cholecalciferol (VITAMIN D3 PO) Take 1 tablet by mouth daily.  . clopidogrel (PLAVIX) 75 MG tablet TAKE 1 TABLET (75 MG TOTAL) BY MOUTH DAILY.  . fexofenadine (ALLEGRA) 180 MG tablet Take 180 mg by mouth at bedtime.   . fluticasone (FLONASE) 50 MCG/ACT nasal spray Place 1 spray into both nostrils daily.   . Garlic 8242 MG CAPS Take 1 capsule by mouth 2 (two) times daily.    . hydrochlorothiazide (MICROZIDE) 12.5 MG capsule TAKE 1 CAPSULE EVERY DAY  . levothyroxine (SYNTHROID) 112 MCG tablet TAKE 1 TABLET EVERY DAY  . lidocaine (XYLOCAINE) 2 % solution Patient: Mix 1part 2% viscous lidocaine, 1part H20. Swish & swallow 56mL of diluted mixture, 40min before meals and at bedtime, up to QID  . lidocaine-prilocaine (EMLA) cream Apply to affected area once  . metoprolol  succinate (TOPROL-XL) 25 MG 24 hr tablet TAKE 1 TABLET EVERY DAY  . Omega-3 Fatty Acids (FISH OIL) 1000 MG CAPS Take 1 capsule (1,000 mg total) by mouth daily.  . ondansetron (ZOFRAN) 8 MG tablet Take 1 tablet (8 mg total) by mouth 2 (two) times daily as needed for refractory nausea / vomiting. Start on day 3 after chemotherapy.  . potassium chloride SA (K-DUR) 20 MEQ tablet Take 1 tablet (20 mEq total) by mouth daily.  . prochlorperazine (COMPAZINE) 10 MG tablet Take 1 tablet (10 mg total) by mouth every 6 (six) hours as needed (Nausea or vomiting).  . simvastatin (ZOCOR) 40 MG tablet TAKE 1 TABLET AT BEDTIME  . sodium fluoride (PREVIDENT 5000 PLUS) 1.1 % CREA dental cream Apply cream to tooth brush. Brush teeth for 2 minutes. Spit out excess. DO NOT rinse afterwards. Repeat nightly.  Marland Kitchen VITAMIN E PO Take 1 capsule by mouth daily.  . fluconazole (DIFLUCAN) 100 MG tablet Take 2 tabs on the first day, and then 1 tab daily until finished (Patient not taking: Reported on 02/09/2019)  . LORazepam (ATIVAN) 0.5 MG tablet Take 1 tablet (0.5 mg total) by mouth every 6 (six) hours as needed (Nausea or vomiting). (Patient not taking: Reported on 02/10/2019)  . traMADol (ULTRAM) 50 MG tablet Take 1 tablet (50 mg total) by mouth every 8 (eight) hours as needed. Mail to the patient please. Patient has [employer] of $700.00. (Patient taking differently: Take 50 mg by mouth every 8 (eight) hours as needed for moderate pain or severe pain. Mail to the patient please. 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 There is no history of Colon cancer.  SOCIAL HISTORY:  She  reports that she quit smoking about 4 years ago. Her smoking use included cigarettes. She smoked 0.50 packs per day. She has never used smokeless tobacco. She reports that she does not drink alcohol or use drugs.  REVIEW OF SYSTEMS:    All 11 point system review were unremarkable other  than what is mentioned history of present illness

## 2019-02-21 NOTE — ED Notes (Addendum)
Levophed rate changed to 20mcg/min per Elwyn Reach, MD.

## 2019-02-21 NOTE — ED Notes (Signed)
Bearhugger removed from pt. Pt temp is 98.5.

## 2019-02-21 NOTE — ED Notes (Signed)
[place]- cousin (patient doesn't have immediate family)

## 2019-02-21 NOTE — ED Notes (Signed)
Due to risk of aspiration. This Probation officer will not proceed with any oral medications. This Probation officer will wait for MD advice.  Patient continues to cough up copious amounts of mucus. Patient remains calm when suctioned and given emesis bag to cough into.

## 2019-02-21 NOTE — ED Notes (Signed)
Pt began coughing, using suction to remove sputum that was yellow in color and thick in consistency. Pt then began vomiting, green in color.

## 2019-02-22 ENCOUNTER — Inpatient Hospital Stay (HOSPITAL_COMMUNITY): Payer: Medicare HMO

## 2019-02-22 DIAGNOSIS — T17908A Unspecified foreign body in respiratory tract, part unspecified causing other injury, initial encounter: Secondary | ICD-10-CM

## 2019-02-22 DIAGNOSIS — R7881 Bacteremia: Secondary | ICD-10-CM

## 2019-02-22 LAB — COMPREHENSIVE METABOLIC PANEL
ALT: 22 U/L (ref 0–44)
AST: 16 U/L (ref 15–41)
Albumin: 3 g/dL — ABNORMAL LOW (ref 3.5–5.0)
Alkaline Phosphatase: 36 U/L — ABNORMAL LOW (ref 38–126)
Anion gap: 9 (ref 5–15)
BUN: 17 mg/dL (ref 8–23)
CO2: 31 mmol/L (ref 22–32)
Calcium: 8.2 mg/dL — ABNORMAL LOW (ref 8.9–10.3)
Chloride: 106 mmol/L (ref 98–111)
Creatinine, Ser: 0.81 mg/dL (ref 0.44–1.00)
GFR calc Af Amer: >60 mL/min (ref >60)
GFR calc non Af Amer: >60 mL/min (ref >60)
Glucose, Bld: 167 mg/dL — ABNORMAL HIGH (ref 70–99)
Potassium: 2.6 mmol/L — CL (ref 3.5–5.1)
Sodium: 146 mmol/L — ABNORMAL HIGH (ref 135–145)
Total Bilirubin: 1 mg/dL (ref 0.3–1.2)
Total Protein: 5.2 g/dL — ABNORMAL LOW (ref 6.5–8.1)

## 2019-02-22 LAB — BASIC METABOLIC PANEL
Anion gap: 6 (ref 5–15)
Anion gap: 6 (ref 5–15)
BUN: 14 mg/dL (ref 8–23)
BUN: 12 mg/dL (ref 8–23)
CO2: 33 mmol/L — ABNORMAL HIGH (ref 22–32)
CO2: 32 mmol/L (ref 22–32)
Calcium: 8.2 mg/dL — ABNORMAL LOW (ref 8.9–10.3)
Calcium: 8.3 mg/dL — ABNORMAL LOW (ref 8.9–10.3)
Chloride: 109 mmol/L (ref 98–111)
Chloride: 109 mmol/L (ref 98–111)
Creatinine, Ser: 0.67 mg/dL (ref 0.44–1.00)
Creatinine, Ser: 0.71 mg/dL (ref 0.44–1.00)
GFR calc Af Amer: >60 mL/min (ref >60)
GFR calc Af Amer: >60 mL/min (ref >60)
GFR calc non Af Amer: >60 mL/min (ref >60)
GFR calc non Af Amer: >60 mL/min (ref >60)
Glucose, Bld: 139 mg/dL — ABNORMAL HIGH (ref 70–99)
Glucose, Bld: 116 mg/dL — ABNORMAL HIGH (ref 70–99)
Potassium: 3.7 mmol/L (ref 3.5–5.1)
Potassium: 3.6 mmol/L (ref 3.5–5.1)
Sodium: 148 mmol/L — ABNORMAL HIGH (ref 135–145)
Sodium: 147 mmol/L — ABNORMAL HIGH (ref 135–145)

## 2019-02-22 LAB — CBC
HCT: 25.7 % — ABNORMAL LOW (ref 36.0–46.0)
Hemoglobin: 8.6 g/dL — ABNORMAL LOW (ref 12.0–15.0)
MCH: 32.1 pg (ref 26.0–34.0)
MCHC: 33.5 g/dL (ref 30.0–36.0)
MCV: 95.9 fL (ref 80.0–100.0)
Platelets: 69 10*3/uL — ABNORMAL LOW (ref 150–400)
RBC: 2.68 MIL/uL — ABNORMAL LOW (ref 3.87–5.11)
RDW: 15.4 % (ref 11.5–15.5)
WBC: 2.5 10*3/uL — ABNORMAL LOW (ref 4.0–10.5)
nRBC: 0 % (ref 0.0–0.2)

## 2019-02-22 LAB — ECHOCARDIOGRAM COMPLETE
Height: 66 in
Weight: 2768.98 oz

## 2019-02-22 MED ORDER — POTASSIUM CHLORIDE 10 MEQ/50ML IV SOLN
10.0000 meq | INTRAVENOUS | Status: AC
  Administered 2019-02-22 (×10): 10 meq via INTRAVENOUS
  Filled 2019-02-22 (×10): qty 50

## 2019-02-22 MED ORDER — HYDROCORTISONE NA SUCCINATE PF 100 MG IJ SOLR: 50.0000 mg | Freq: Two times a day (BID) | INTRAMUSCULAR | Status: DC

## 2019-02-22 MED ORDER — KCL IN DEXTROSE-NACL 40-5-0.45 MEQ/L-%-% IV SOLN
INTRAVENOUS | Status: DC
  Administered 2019-02-22 – 2019-02-24 (×3): via INTRAVENOUS
  Administered 2019-02-25: 50 mL/h via INTRAVENOUS
  Administered 2019-02-26 – 2019-02-27 (×2): via INTRAVENOUS
  Filled 2019-02-22 (×8): qty 1000

## 2019-02-22 NOTE — Progress Notes (Addendum)
PULMONARY / CRITICAL CARE MEDICINE   NAME:  Gloria Murray, MRN:  [id number], DOB:  1951/08/01, LOS: 1 ADMISSION DATE:  02/17/2019, CONSULTATION DATE: 02/21/2019 REFERRING MD: ED physician cHIEF COMPLAINT: Fall  BRIEF HISTORY:    68 year old lady with squamous cell carcinoma of the epiglottis coming in with hypotension and septic shock requiring vasopressors. -stage IVB(she is s/p 5 cycles of carboplatin & xrt>tolerated poorly) -declined feeding tube   SIGNIFICANT PAST MEDICAL HISTORY   Laryngeal cancer, involving epiglottis currently undergoing chemo and radiation therapy, carotid artery occlusion, heart murmur, hypothyroidism, osteoporosis, hyperlipidemia,  SIGNIFICANT EVENTS:  7/23: Admitted with sepsis required vasoactive drips 7/24: Off pressors more awake growing GPC in blood ready for transfer out of ICU SLP ordered STUDIES:   Chest x-ray 02/19/2019 no pulmonary filtrate CULTURES:  Blood culture>> gram-positive cocci  ANTIBIOTICS:  Cefepime Zosyn 02/21/2019 Vancomycin 02/21/2019  LINES/TUBES:   Port-A-Cath CONSULTANTS:    SUBJECTIVE:  Feels better  CONSTITUTIONAL: Blood Pressure 133/75   Pulse 97   Temperature 97.9 F (36.6 C)   Respiration (Abnormal) 8   Height 5\' 6"  (1.676 m)   Weight 78.5 kg   Oxygen Saturation 100%   Body Mass Index 27.93 kg/m   I/O last 3 completed shifts: In: 7211.1 [I.V.:1686.6; IV Piggyback:5524.5] Out: 2475 [Urine:2475]        PHYSICAL EXAM: General: Is a pleasant 68 year old female she looks much older than stated age she is in no distress and actually looks better today than when compared to exam on the 23rd HEENT normocephalic atraumatic she does have radiation appearing burns mucous membranes are dry Pulmonary: Coarse scattered rhonchi Cardiac: Regular rate and rhythm Abdomen: Soft nontender Extremities: Warm dry brisk cap refill diffuse and multiple areas of ecchymosis GU: Voids Neuro: Intact  RESOLVED PROBLEM LIST   Septic shock (off pressors 7/23) Acute hypoxic respiratory failure  Vomiting  ASSESSMENT AND PLAN    Sepsis w/ GPC bacteremia and presumed aspiration PNA pcxr personally reviewed: mild bibasilar R>L infiltrates.  Plan F/u cultures  Vanc/zosyn day 2 ECHO Cont IVFs Swallow eval   Presumed adrenal insufficiency Plan Taper stress dose steroids   Fluid and electrolyte imbalance: hypernatremia, hypokalemia  Plan Cont d51/2; add KCL Cont KCL replacement  Afternoon chem  Thrombocytopenia/pancytopenia Plan Trending cbc SCDs  Hypothyroidism  Plan Cont replacement   Recurrent vomiting Plan PRNs  Squamous cell carcinoma of the epiglottis on chemotherapy/radiation Plan F/u outpt  She would benefit from palliative consult     Best Practice / Goals of Care / Disposition.   DVT PROPHYLAXIS: SCDs SUP: None NUTRITION: N.p.o. MOBILITY: Bedrest GOALS OF CARE: Full code FAMILY DISCUSSIONS: No family available [place] ACNP-BC [place] Pager # [phone] OR # [phone] if no answer

## 2019-02-22 NOTE — Evaluation (Signed)
Clinical/Bedside Swallow Evaluation Patient Details  Name: Gloria Murray MRN: [id number] Date of Birth: 1951/02/26  Today's Date: 02/22/2019 Time: SLP Start Time (ACUTE ONLY): 1238 SLP Stop Time (ACUTE ONLY): 1305 SLP Time Calculation (min) (ACUTE ONLY): 27 min  Past Medical History:  Past Medical History:  Diagnosis Date  . Allergy   . Carotid artery occlusion   . Heart murmur   . Hyperlipidemia   . Hypertension   . Hypothyroidism   . Laryngeal mass   . Osteoporosis   . Thyroid disease   . Wears glasses   . Wears upper complete and lower partial dentures    Past Surgical History:  Past Surgical History:  Procedure Laterality Date  . ARCH AORTOGRAM N/A 07/23/2014   Procedure: ARCH AORTOGRAM;  Surgeon: Rosetta Posner, MD;  Location: [facility] CATH LAB;  Service: Cardiovascular;  Laterality: N/A;  . CAROTID ANGIOGRAM N/A 07/23/2014   Procedure: CAROTID ANGIOGRAM;  Surgeon: Rosetta Posner, MD;  Location: [facility] CATH LAB;  Service: Cardiovascular;  Laterality: N/A;  . CAROTID ENDARTERECTOMY  12/11/2008   left  . CAROTID STENT INSERTION Left 10/14/2014   Procedure: CAROTID STENT INSERTION;  Surgeon: Serafina Mitchell, MD;  Location: [facility] CATH LAB;  Service: Cardiovascular;  Laterality: Left;   carotid  . CARPAL TUNNEL RELEASE     X 2, right wrist  . COLONOSCOPY  Jan. 2, 2015  . DILATION AND CURETTAGE OF UTERUS     X 2  . IR IMAGING GUIDED PORT INSERTION  12/06/2018  . laceration hand Right    hand  . LARYNGOSCOPY N/A 11/08/2018   Procedure: DIRECT MICROLARYNGOSCOPY WITH BIOPSY;  Surgeon: Melida Quitter, MD;  Location: [place];  Service: ENT;  Laterality: N/A;  . RIGID ESOPHAGOSCOPY N/A 11/08/2018   Procedure: ESOPHAGOSCOPY;  Surgeon: Melida Quitter, MD;  Location: [place];  Service: ENT;  Laterality: N/A;  . THYROIDECTOMY    . TUBAL LIGATION  1975   bilateral   HPI:  68 yo female with treatment for Laryngeal cancer, involving epiglottis currently undergoing chemo and radiation therapy, carotid artery  occlusion, heart murmur, hypothyroidism, osteoporosis, hyperlipidemia.  Pt CXR 02/22/2019 showed Interval development of mild bibasilar atelectasis/infiltrate.  Pt had n/v prior to admission ? source of pna but with head and neck cancer, pharyngeal dysphagia may contribute.   Assessment / Plan / Recommendation Clinical Impression  Pt presents with indications of pharyngeal dysphagia c/b wet voice at baseline and hoarseness.  She denies issues with dysphagia prior to hospitalization but admits to current issues.  She does have lesions on her lips - and blood tinged secretions orally - ? if due to XRT or mucositis from chemoXRT.  Regardless concerns for aspiration are present and MBS indicated to determine least restrictive diet.  Pt had n/v prior to admission ? source of pna but with head and neck cancer, pharyngeal dysphagia may contribute. SLP Visit Diagnosis: Dysphagia, oropharyngeal phase (R13.12)    Aspiration Risk  Severe aspiration risk;Risk for inadequate nutrition/hydration    Diet Recommendation NPO        Other  Recommendations Oral Care Recommendations: Oral care BID   Follow up Recommendations n/a  Frequency and Duration            Prognosis Prognosis for Safe Diet Advancement: Fair      Swallow Study   General Date of Onset: 02/22/19 HPI: 68 yo female with treatment for Laryngeal cancer, involving epiglottis currently undergoing chemo and radiation therapy, carotid artery occlusion, heart murmur,  hypothyroidism, osteoporosis, hyperlipidemia.  Pt CXR 02/22/2019 showed Interval development of mild bibasilar atelectasis/infiltrate.  Pt had n/v prior to admission ? source of pna but with head and neck cancer, pharyngeal dysphagia may contribute. Type of Study: Bedside Swallow Evaluation Diet Prior to this Study: NPO Temperature Spikes Noted: No Respiratory Status: Room air History of Recent Intubation: No Behavior/Cognition: Alert;Cooperative;Pleasant mood Oral Cavity  Assessment: Within Functional Limits Oral Care Completed by SLP: Yes Oral Cavity - Dentition: Dentures, top Vision: Functional for self-feeding Self-Feeding Abilities: Able to feed self Patient Positioning: Upright in bed Baseline Vocal Quality: Normal Volitional Cough: Weak Volitional Swallow: Able to elicit    Oral/Motor/Sensory Function Overall Oral Motor/Sensory Function: Within functional limits   Ice Chips Ice chips: Impaired Presentation: Spoon Pharyngeal Phase Impairments: Suspected delayed Swallow;Throat Clearing - Immediate   Thin Liquid Thin Liquid: Not tested    Nectar Thick Nectar Thick Liquid: Impaired Presentation: Spoon Pharyngeal Phase Impairments: Throat Clearing - Immediate;Throat Clearing - Delayed   Honey Thick Honey Thick Liquid: Not tested   Puree Puree: Not tested   Solid     Solid: Not tested      Macario Golds 02/22/2019,1:15 PM  Luanna Salk, [place] SLP Acute Rehab Services Pager [phone] Office [phone]

## 2019-02-22 NOTE — Progress Notes (Signed)
CRITICAL VALUE ALERT  Critical Value: potassium 2.6  Date & Time Notied:  02/22/19 @0420    Provider Notified: E link @ 0427  Orders Received/Actions taken: new orders received

## 2019-02-22 NOTE — Progress Notes (Signed)
  Echocardiogram 2D Echocardiogram has been performed.  Burnett Kanaris 02/22/2019, 2:12 PM

## 2019-02-22 NOTE — Progress Notes (Signed)
Patient unable to take PO meds at this time due to secretions and choking. Let pete babcock, NP know I held patient PO meds this AM. Documented in MAR. Laurey Arrow, NP wants speech to see patient before giving PO meds. Speech to see patient today.

## 2019-02-22 NOTE — Progress Notes (Signed)
Modified Barium Swallow Progress Note  Patient Details  Name: Gloria Murray MRN: [id number] Date of Birth: Dec 07, 1950  Today's Date: 02/22/2019  Modified Barium Swallow completed.  Full report located under Chart Review in the Imaging Section.  Brief recommendations include the following:  Clinical Impression  Patient presents with severe oropharyngeal dysphagia due to XRT, edema and decreased motility.  She is aspirating secretions and barium intake *largely silently* and unable to clear aspirates or with cued cough/expectoration.  Delayed oral transiting noted resulting in premature spillage of barium into pharynx.  Swallow is weak and pharyngeal trigger is delayed resulting in laryngeal penetration and aspiration and significant residuals. Various postures including chin tuck with and without head turn left *side with edema* and neck extension did not improve airway protection nor pharyngeal clearance.  At this time, swallow dysfunction does not allow pt to consume po efficiently or without aspiration.  Educated her to findings using teach back and live feed.       Will follow up for swallowing exercises and indication for repeat instrumental swallow evaluation.   Hopeful for continued swallow function with decreased edema and healing from XRT.    Recommend pt be able to consume water and ice chips freely after oral care to decrease disuse muscle atrophy.        Swallow Evaluation Recommendations       SLP Diet Recommendations: NPO;Free water protocol after oral care       Medication Administration: Via alternative means               Oral Care Recommendations: Oral care QID   Other Recommendations: Have oral suction available   Luanna Salk, [place] [facility] SLP Acute Rehab Services Pager [phone] Office [phone]  Macario Golds 02/22/2019,4:59 PM

## 2019-02-22 NOTE — Progress Notes (Signed)
[facility] ADULT ICU REPLACEMENT PROTOCOL FOR AM LAB REPLACEMENT ONLY  The patient does apply for the [facility] Adult ICU Electrolyte Replacment Protocol based on the criteria listed below:   1. Is GFR >/= 40 ml/min? Yes.    Patient's GFR today is >60 2. Is urine output >/= 0.5 ml/kg/hr for the last 6 hours? Yes.   Patient's UOP is 1.2 ml/kg/hr 3. Is BUN < 60 mg/dL? Yes.    Patient's BUN today is 17 4. Abnormal electrolyte(s): K-2.6 5. Ordered repletion with: per protocol 6. If a panic level lab has been reported, has the CCM MD in charge been notified? Yes.  .   Physician:  Dr. Jonetta Speak, [place]is Nettle 02/22/2019 4:36 AM

## 2019-02-23 DIAGNOSIS — I10 Essential (primary) hypertension: Secondary | ICD-10-CM

## 2019-02-23 LAB — BASIC METABOLIC PANEL
Anion gap: 8 (ref 5–15)
BUN: 11 mg/dL (ref 8–23)
CO2: 32 mmol/L (ref 22–32)
Calcium: 8.2 mg/dL — ABNORMAL LOW (ref 8.9–10.3)
Chloride: 106 mmol/L (ref 98–111)
Creatinine, Ser: 0.61 mg/dL (ref 0.44–1.00)
GFR calc Af Amer: >60 mL/min (ref >60)
GFR calc non Af Amer: >60 mL/min (ref >60)
Glucose, Bld: 116 mg/dL — ABNORMAL HIGH (ref 70–99)
Potassium: 3.8 mmol/L (ref 3.5–5.1)
Sodium: 146 mmol/L — ABNORMAL HIGH (ref 135–145)

## 2019-02-23 LAB — CBC
HCT: 25.9 % — ABNORMAL LOW (ref 36.0–46.0)
Hemoglobin: 8.5 g/dL — ABNORMAL LOW (ref 12.0–15.0)
MCH: 31.7 pg (ref 26.0–34.0)
MCHC: 32.8 g/dL (ref 30.0–36.0)
MCV: 96.6 fL (ref 80.0–100.0)
Platelets: 70 10*3/uL — ABNORMAL LOW (ref 150–400)
RBC: 2.68 MIL/uL — ABNORMAL LOW (ref 3.87–5.11)
RDW: 15.3 % (ref 11.5–15.5)
WBC: 1.7 10*3/uL — ABNORMAL LOW (ref 4.0–10.5)
nRBC: 0 % (ref 0.0–0.2)

## 2019-02-23 LAB — CULTURE, BLOOD (ROUTINE X 2)
Special Requests: ADEQUATE
Special Requests: ADEQUATE

## 2019-02-23 MED ORDER — ACETYLCYSTEINE 10 % IN SOLN
2.0000 mL | Freq: Two times a day (BID) | RESPIRATORY_TRACT | Status: DC
  Filled 2019-02-23: qty 4

## 2019-02-23 MED ORDER — ACETYLCYSTEINE 20 % IN SOLN
4.0000 mL | Freq: Two times a day (BID) | RESPIRATORY_TRACT | Status: DC
  Administered 2019-02-23 – 2019-02-27 (×9): 4 mL via RESPIRATORY_TRACT
  Filled 2019-02-23 (×9): qty 4

## 2019-02-23 MED ORDER — PENICILLIN G POTASSIUM 20000000 UNITS IJ SOLR
12.0000 10*6.[IU] | Freq: Two times a day (BID) | INTRAVENOUS | Status: DC
  Administered 2019-02-23 – 2019-02-27 (×8): 12 10*6.[IU] via INTRAVENOUS
  Filled 2019-02-23 (×10): qty 12

## 2019-02-23 MED ORDER — GUAIFENESIN ER 600 MG PO TB12
600.0000 mg | ORAL_TABLET | Freq: Two times a day (BID) | ORAL | Status: DC
  Filled 2019-02-23: qty 1

## 2019-02-23 MED ORDER — ALBUTEROL SULFATE (2.5 MG/3ML) 0.083% IN NEBU
2.5000 mg | INHALATION_SOLUTION | RESPIRATORY_TRACT | Status: DC | PRN
  Administered 2019-02-23 – 2019-02-27 (×8): 2.5 mg via RESPIRATORY_TRACT
  Filled 2019-02-23 (×8): qty 3

## 2019-02-23 NOTE — Progress Notes (Signed)
TX TO 87 IN THE BED WITH RN AND NT. REPORT WAS GIVEN TO ELLA RN. FOLEY D/C'D 1600.

## 2019-02-23 NOTE — Progress Notes (Signed)
Chief Complaint: Patient was seen in consultation today for port removal at the request of Dr. Antonieta Pert  Referring Physician(s): Dr. Antonieta Pert   Supervising Physician: Sandi Mariscal  Patient Status: [facility] - In-pt  History of Present Illness: Gloria Murray is a 68 y.o. female with hx of supraglottic throat cancer. She had port placed on 12/06/18 and has been receiving treatment. She was admitted a few days ago with hypotension and septic shock. She has improved clinically but her blood cultures are positive. ID and Oncology feel best if port was removed. IR is asked to remove port. PMHx, meds, labs, imaging, allergies reviewed. On daily Plavix but hasn't been getting it because she has been NPO. Has refused feeding tube though.   Past Medical History:  Diagnosis Date   Allergy    Carotid artery occlusion    Heart murmur    Hyperlipidemia    Hypertension    Hypothyroidism    Laryngeal mass    Osteoporosis    Thyroid disease    Wears glasses    Wears upper complete and lower partial dentures     Past Surgical History:  Procedure Laterality Date   ARCH AORTOGRAM N/A 07/23/2014   Procedure: ARCH AORTOGRAM;  Surgeon: Rosetta Posner, MD;  Location: [facility] CATH LAB;  Service: Cardiovascular;  Laterality: N/A;   CAROTID ANGIOGRAM N/A 07/23/2014   Procedure: CAROTID ANGIOGRAM;  Surgeon: Rosetta Posner, MD;  Location: [facility] CATH LAB;  Service: Cardiovascular;  Laterality: N/A;   CAROTID ENDARTERECTOMY  12/11/2008   left   CAROTID STENT INSERTION Left 10/14/2014   Procedure: CAROTID STENT INSERTION;  Surgeon: Serafina Mitchell, MD;  Location[facility] CATH LAB;  Service: Cardiovascular;  Laterality: Left;   carotid   CARPAL TUNNEL RELEASE     X 2, right wrist   COLONOSCOPY  Jan. 2, 2015   DILATION AND CURETTAGE OF UTERUS     X 2   IR IMAGING GUIDED PORT INSERTION  12/06/2018   laceration hand Right    hand   LARYNGOSCOPY N/A 11/08/2018   Procedure: DIRECT MICROLARYNGOSCOPY  WITH BIOPSY;  Surgeon: Melida Quitter, MD;  Location: [place];  Service: ENT;  Laterality: N/A;   RIGID ESOPHAGOSCOPY N/A 11/08/2018   Procedure: ESOPHAGOSCOPY;  Surgeon: Melida Quitter, MD;  Location: [place];  Service: ENT;  Laterality: N/A;   THYROIDECTOMY     TUBAL LIGATION  1975   bilateral    Allergies: Propoxyphene hcl and Propoxyphene n-acetaminophen  Medications:  Current Facility-Administered Medications:    acetaminophen (TYLENOL) tablet 650 mg, 650 mg, Oral, Q6H PRN **OR** acetaminophen (TYLENOL) suppository 650 mg, 650 mg, Rectal, Q6H PRN, Erick Colace, NP   acetylcysteine (MUCOMYST) 20 % nebulizer / oral solution 4 mL, 4 mL, Nebulization, BID, Kc, Ramesh, MD, 4 mL at 02/23/19 1121   albuterol (PROVENTIL) (2.5 MG/3ML) 0.083% nebulizer solution 2.5 mg, 2.5 mg, Nebulization, Q4H PRN, Kc, Ramesh, MD, 2.5 mg at 02/23/19 1122   aspirin EC tablet 81 mg, 81 mg, Oral, Daily, Erick Colace, NP, Stopped at 02/21/19 1024   chlorhexidine (PERIDEX) 0.12 % solution 15 mL, 15 mL, Mouth Rinse, BID, Salvadore Dom E, NP, 15 mL at 02/22/19 2222   Chlorhexidine Gluconate Cloth 2 % PADS 6 each, 6 each, Topical, Daily, Erick Colace, NP, 6 each at 02/23/19 1314   clopidogrel (PLAVIX) tablet 75 mg, 75 mg, Oral, Daily, Erick Colace, NP, Stopped at 02/21/19 1025   dextrose 5 % and 0.45 %  NaCl with KCl 40 mEq/L infusion, , Intravenous, Continuous, Erick Colace, NP, Last Rate: 50 mL/hr at 02/23/19 3295   guaiFENesin (MUCINEX) 12 hr tablet 600 mg, 600 mg, Oral, BID, Kc, Ramesh, MD   levothyroxine (SYNTHROID, LEVOTHROID) injection 56 mcg, 56 mcg, Intravenous, Q0600, Erick Colace, NP, 56 mcg at 02/23/19 0531   lidocaine (XYLOCAINE) 2 % viscous mouth solution 15 mL, 15 mL, Mouth/Throat, Q6H PRN, Erick Colace, NP   MEDLINE mouth rinse, 15 mL, Mouth Rinse, q12n4p, Erick Colace, NP, 15 mL at 02/23/19 1314   ondansetron (ZOFRAN) tablet 4 mg, 4 mg, Oral, Q6H PRN **OR**  ondansetron (ZOFRAN) injection 4 mg, 4 mg, Intravenous, Q6H PRN, Erick Colace, NP   penicillin G potassium 12 Million Units in dextrose 5 % 500 mL continuous infusion, 12 Million Units, Intravenous, BID, Polly Cobia, RPH, Last Rate: 41.7 mL/hr at 02/23/19 1310, 12 Million Units at 02/23/19 1310   potassium chloride 20 MEQ/15ML (10%) solution 40 mEq, 40 mEq, Oral, Once, Erick Colace, NP, Stopped at 02/26/2019 2200   simvastatin (ZOCOR) tablet 40 mg, 40 mg, Oral, QHS, Erick Colace, NP, Stopped at 02/12/2019 2200   sodium chloride flush (NS) 0.9 % injection 3 mL, 3 mL, Intravenous, Q12H, Erick Colace, NP, 3 mL at 02/23/19 1316    Family History  Problem Relation Age of Onset   Diabetes Mother    Colon cancer Neg Hx     Social History   Socioeconomic History   Marital status: Widowed    Spouse name: Not on file   Number of children: Not on file   Years of education: Not on file   Highest education level: Not on file  Occupational History   Occupation: [employer]    Employer: [employer] resource strain: Not on file   Food insecurity    Worry: Not on file    Inability: Not on file   Transportation needs    Medical: No    Non-medical: No  Tobacco Use   Smoking status: Former Smoker    Packs/day: 0.50    Types: Cigarettes    Quit date: 07/23/2014    Years since quitting: 4.5   Smokeless tobacco: Never Used   Tobacco comment: quit smoking cigarettes in " 2018-19"  Substance and Sexual Activity   Alcohol use: No    Alcohol/week: 0.0 standard drinks   Drug use: No   Sexual activity: Never  Lifestyle   Physical activity    Days per week: Not on file    Minutes per session: Not on file   Stress: Not on file  Relationships   Social connections    Talks on phone: Not on file    Gets together: Not on file    Attends religious service: Not on file    Active member of club or organization: Not on file    Attends  meetings of clubs or organizations: Not on file    Relationship status: Not on file  Other Topics Concern   Not on file  Social History Narrative   Does not have a living will.   Does not have any family.      [place] "make decisions" for her-desires CPR, does not want prolonged life support if futile.      Both of her children- died in car accident at ages 7 and 68- husband went into a diabetic coma while driving.  Review of Systems: A 12 point ROS discussed and pertinent positives are indicated in the HPI above.  All other systems are negative.  Review of Systems  Vital Signs: BP 104/72    Pulse (!) 103    Temp 98.8 F (37.1 C)    Resp 16    Ht 5\' 6"  (1.676 m)    Wt 79.7 kg    SpO2 100%    BMI 28.36 kg/m   Physical Exam Constitutional:      Appearance: She is not ill-appearing.  HENT:     Mouth/Throat:     Mouth: Mucous membranes are moist.  Neck:     Comments: Post surgical and radiation changes Cardiovascular:     Rate and Rhythm: Normal rate and regular rhythm.     Heart sounds: Normal heart sounds.  Pulmonary:     Effort: Pulmonary effort is normal. No respiratory distress.     Breath sounds: Normal breath sounds.  Skin:    Comments: (R)chest port currently accessed. No erythema or tenderness to suggest port site infection.  Neurological:     Mental Status: She is alert.      Imaging: Dg Chest Port 1 View  Result Date: 02/22/2019 CLINICAL DATA:  Pneumonia EXAM: PORTABLE CHEST 1 VIEW COMPARISON:  02/06/2019 FINDINGS: Mild bibasilar airspace disease has developed since the prior study. Negative for heart failure or effusion. Heart size normal. Port-A-Cath tip at the cavoatrial junction unchanged. IMPRESSION: Interval development of mild bibasilar atelectasis/infiltrate. Possible pneumonia. Electronically Signed   By: Franchot Gallo M.D.   On: 02/22/2019 08:03   Dg Chest Port 1 View  Result Date: 02/28/2019 CLINICAL DATA:  Weakness.  Fall. EXAM:  PORTABLE CHEST 1 VIEW COMPARISON:  PET-CT dated November 23, 2018. Chest x-ray dated Dec 10, 2008. FINDINGS: Right chest wall port catheter with tip at the cavoatrial junction. The heart size and mediastinal contours are within normal limits. Atherosclerotic calcification of the aortic arch. Normal pulmonary vascularity. No focal consolidation, pleural effusion, or pneumothorax. No acute osseous abnormality. Left internal carotid artery stent. IMPRESSION: No active disease. Electronically Signed   By: Titus Dubin M.D.   On: 02/26/2019 17:43   Dg Swallowing Func-speech Pathology  Result Date: 02/22/2019 Objective Swallowing Evaluation: Type of Study: MBS-Modified Barium Swallow Study  Patient Details Name: LAGUANA DESAUTEL MRN: [id number] Date of Birth: 06/15/1951 Today's Date: 02/22/2019 Time: SLP Start Time (ACUTE ONLY): 1415 -SLP Stop Time (ACUTE ONLY): 1450 SLP Time Calculation (min) (ACUTE ONLY): 35 min Past Medical History: Past Medical History: Diagnosis Date  Allergy   Carotid artery occlusion   Heart murmur   Hyperlipidemia   Hypertension   Hypothyroidism   Laryngeal mass   Osteoporosis   Thyroid disease   Wears glasses   Wears upper complete and lower partial dentures  Past Surgical History: Past Surgical History: Procedure Laterality Date  ARCH AORTOGRAM N/A 07/23/2014  Procedure: ARCH AORTOGRAM;  Surgeon: Rosetta Posner, MD;  Location: [facility] CATH LAB;  Service: Cardiovascular;  Laterality: N/A;  CAROTID ANGIOGRAM N/A 07/23/2014  Procedure: CAROTID ANGIOGRAM;  Surgeon: Rosetta Posner, MD;  Location: [facility] CATH LAB;  Service: Cardiovascular;  Laterality: N/A;  CAROTID ENDARTERECTOMY  12/11/2008  left  CAROTID STENT INSERTION Left 10/14/2014  Procedure: CAROTID STENT INSERTION;  Surgeon: Serafina Mitchell, MD;  Location: [facility] CATH LAB;  Service: Cardiovascular;  Laterality: Left;   carotid  CARPAL TUNNEL RELEASE    X 2, right wrist  COLONOSCOPY  Jan. 2, 2015  DILATION  AND CURETTAGE OF UTERUS    X 2  IR  IMAGING GUIDED PORT INSERTION  12/06/2018  laceration hand Right   hand  LARYNGOSCOPY N/A 11/08/2018  Procedure: DIRECT MICROLARYNGOSCOPY WITH BIOPSY;  Surgeon: Melida Quitter, MD;  Location: [place];  Service: ENT;  Laterality: N/A;  RIGID ESOPHAGOSCOPY N/A 11/08/2018  Procedure: ESOPHAGOSCOPY;  Surgeon: Melida Quitter, MD;  Location: [facility] OR;  Service: ENT;  Laterality: N/A;  THYROIDECTOMY    TUBAL LIGATION  1975  bilateral HPI: 68 yo female with treatment for Laryngeal cancer, involving epiglottis currently undergoing chemo and radiation therapy, carotid artery occlusion, heart murmur, hypothyroidism, osteoporosis, hyperlipidemia.  Pt CXR 02/22/2019 showed Interval development of mild bibasilar atelectasis/infiltrate.  Pt had n/v prior to admission ? source of pna but with head and neck cancer, pharyngeal dysphagia may contribute.  Subjective: pt awake in bed Assessment / Plan / Recommendation CHL IP CLINICAL IMPRESSIONS 02/22/2019 Clinical Impression Patient presents with severe oropharyngeal dysphagia due to XRT, edema and decreased motility.  She is aspirating secretions and barium intake *largely silently* and unable to clear aspirates or with cued cough/expectoration.  Delayed oral transiting noted resulting in premature spillage of barium into pharynx.  Swallow is weak and pharyngeal trigger is delayed resulting in laryngeal penetration and aspiration and significant residuals. Various postures including chin tuck with and without head turn left *side with edema* and neck extension did not improve airway protection nor pharyngeal clearance.  At this time, swallow dysfunction does not allow pt to consume po efficiently or without aspiration.  Educated her to findings using teach back and live feed.   Will follow up for swallowing exercises and indication for repeat instrumental swallow evaluation.   Hopeful for continued swallow function with decreased edema and healing from XRT.  Recommend pt be able to consume  water and ice chips freely after oral care to decrease disuse muscle atrophy.        SLP Visit Diagnosis Dysphagia, oropharyngeal phase (R13.12) Attention and concentration deficit following -- Frontal lobe and executive function deficit following -- Impact on safety and function --   CHL IP TREATMENT RECOMMENDATION 02/22/2019 Treatment Recommendations Therapy as outlined in treatment plan below   Prognosis 02/22/2019 Prognosis for Safe Diet Advancement Guarded Barriers to Reach Goals Severity of deficits Barriers/Prognosis Comment -- CHL IP DIET RECOMMENDATION 02/22/2019 SLP Diet Recommendations NPO;Free water protocol after oral care Liquid Administration via -- Medication Administration Via alternative means Compensations -- Postural Changes --   CHL IP OTHER RECOMMENDATIONS 02/22/2019 Recommended Consults -- Oral Care Recommendations Oral care QID Other Recommendations Have oral suction available   CHL IP FOLLOW UP RECOMMENDATIONS 02/22/2019 Follow up Recommendations Home health SLP   CHL IP FREQUENCY AND DURATION 02/22/2019 Speech Therapy Frequency (ACUTE ONLY) min 2x/week Treatment Duration 2 weeks      CHL IP ORAL PHASE 02/22/2019 Oral Phase Impaired Oral - Pudding Teaspoon -- Oral - Pudding Cup -- Oral - Honey Teaspoon -- Oral - Honey Cup -- Oral - Nectar Teaspoon Delayed oral transit;Reduced posterior propulsion;Premature spillage;Decreased bolus cohesion;Weak lingual manipulation Oral - Nectar Cup Delayed oral transit;Reduced posterior propulsion;Premature spillage;Decreased bolus cohesion;Weak lingual manipulation Oral - Nectar Straw -- Oral - Thin Teaspoon Delayed oral transit;Reduced posterior propulsion;Premature spillage;Decreased bolus cohesion;Weak lingual manipulation Oral - Thin Cup Delayed oral transit;Reduced posterior propulsion;Premature spillage;Decreased bolus cohesion;Weak lingual manipulation Oral - Thin Straw Reduced posterior propulsion;Delayed oral transit;Premature spillage;Decreased bolus  cohesion;Weak lingual manipulation Oral - Puree Delayed oral transit;Reduced posterior propulsion;Premature spillage;Decreased bolus cohesion;Weak lingual manipulation  Oral - Mech Soft -- Oral - Regular -- Oral - Multi-Consistency -- Oral - Pill -- Oral Phase - Comment --  CHL IP PHARYNGEAL PHASE 02/22/2019 Pharyngeal Phase Impaired Pharyngeal- Pudding Teaspoon -- Pharyngeal -- Pharyngeal- Pudding Cup -- Pharyngeal -- Pharyngeal- Honey Teaspoon -- Pharyngeal -- Pharyngeal- Honey Cup -- Pharyngeal -- Pharyngeal- Nectar Teaspoon -- Pharyngeal -- Pharyngeal- Nectar Cup Reduced pharyngeal peristalsis;Reduced epiglottic inversion;Reduced anterior laryngeal mobility;Reduced laryngeal elevation;Reduced airway/laryngeal closure;Reduced tongue base retraction Pharyngeal Material enters airway, passes BELOW cords without attempt by patient to eject out (silent aspiration) Pharyngeal- Nectar Straw -- Pharyngeal -- Pharyngeal- Thin Teaspoon Reduced pharyngeal peristalsis;Reduced epiglottic inversion;Reduced laryngeal elevation;Reduced airway/laryngeal closure;Reduced tongue base retraction;Pharyngeal residue - valleculae;Pharyngeal residue - pyriform;Penetration/Aspiration during swallow Pharyngeal Material enters airway, passes BELOW cords without attempt by patient to eject out (silent aspiration) Pharyngeal- Thin Cup Reduced pharyngeal peristalsis;Pharyngeal residue - valleculae;Reduced laryngeal elevation;Pharyngeal residue - pyriform;Reduced airway/laryngeal closure;Reduced tongue base retraction;Reduced epiglottic inversion;Penetration/Aspiration during swallow;Moderate aspiration Pharyngeal Material enters airway, passes BELOW cords without attempt by patient to eject out (silent aspiration) Pharyngeal- Thin Straw Reduced pharyngeal peristalsis;Pharyngeal residue - pyriform;Pharyngeal residue - valleculae;Moderate aspiration;Reduced epiglottic inversion;Reduced anterior laryngeal mobility;Reduced laryngeal  elevation;Compensatory strategies attempted (with notebox);Reduced airway/laryngeal closure;Penetration/Apiration after swallow;Penetration/Aspiration during swallow Pharyngeal Material enters airway, passes BELOW cords without attempt by patient to eject out (silent aspiration) Pharyngeal- Puree Reduced epiglottic inversion;Reduced tongue base retraction;Reduced laryngeal elevation;Reduced anterior laryngeal mobility;Reduced pharyngeal peristalsis;Pharyngeal residue - valleculae Pharyngeal -- Pharyngeal- Mechanical Soft -- Pharyngeal -- Pharyngeal- Regular -- Pharyngeal -- Pharyngeal- Multi-consistency -- Pharyngeal -- Pharyngeal- Pill -- Pharyngeal -- Pharyngeal Comment chin tuck not helpful to prevent aspiration, neck extension  CHL IP CERVICAL ESOPHAGEAL PHASE 02/22/2019 Cervical Esophageal Phase WFL Pudding Teaspoon -- Pudding Cup -- Honey Teaspoon -- Honey Cup -- Nectar Teaspoon -- Nectar Cup -- Nectar Straw -- Thin Teaspoon -- Thin Cup -- Thin Straw -- Puree -- Mechanical Soft -- Regular -- Multi-consistency -- Pill -- Cervical Esophageal Comment -- Macario Golds 02/22/2019, 5:01 PM    Luanna Salk, MS [facility] SLP Acute Rehab Services Pager [phone] Office [phone]            Labs:  CBC: Recent Labs    02/22/2019 1718 02/21/19 0510 02/22/19 0351 02/23/19 0829  WBC 2.8* 5.9 2.5* 1.7*  HGB 10.5* 9.5* 8.6* 8.5*  HCT 30.3* 28.7* 25.7* 25.9*  PLT 79* 110* 69* 70*    COAGS: Recent Labs    12/06/18 1322  INR 1.0    BMP: Recent Labs    02/22/19 0351 02/22/19 1330 02/22/19 2013 02/23/19 0418  NA 146* 148* 147* 146*  K 2.6* 3.7 3.6 3.8  CL 106 109 109 106  CO2 31 33* 32 32  GLUCOSE 167* 139* 116* 116*  BUN 17 14 12 11   CALCIUM 8.2* 8.2* 8.3* 8.2*  CREATININE 0.81 0.67 0.71 0.61  GFRNONAA >60 >60 >60 >60  GFRAA >60 >60 >60 >60    LIVER FUNCTION TESTS: Recent Labs    10/19/18 1417 11/21/18 1251 02/08/2019 1718 02/22/19 0351  BILITOT 0.5 0.4 1.5* 1.0  AST 13 15 21  16   ALT 15 17 29 22   ALKPHOS 68 67 45 36*  PROT 7.3 7.2 6.5 5.2*  ALBUMIN 4.5 4.1 3.8 3.0*    TUMOR MARKERS: No results for input(s): AFPTM, CEA, CA199, CHROMGRNA in the last 8760 hours.  Assessment and Plan: Bacteremia Plan for port removal D/W Dr. Pascal Lux, Pt appears stable. Will plan for procedure Mon 7/27.  Continue holding Plavix Risks and benefits  of port removal was discussed with the patient including, but not limited to bleeding, infection, pneumothorax, or fibrin sheath development and need for additional procedures.  All of the patient's questions were answered, patient is agreeable to proceed. Consent signed and in chart.    Thank you for this interesting consult.  I greatly enjoyed meeting Gloria Murray and look forward to participating in their care.  A copy of this report was sent to the requesting provider on this date.  Electronically Signed: Ascencion Dike, PA-C 02/23/2019, 1:35 PM   I spent a total of 20 minutes in face to face in clinical consultation, greater than 50% of which was counseling/coordinating care for port removal

## 2019-02-23 NOTE — Progress Notes (Signed)
PULMONARY / CRITICAL CARE MEDICINE   NAME:  Gloria Murray, MRN:  [id number], DOB:  02-22-1951, LOS: 2 ADMISSION DATE:  02/28/2019, CONSULTATION DATE: 02/21/2019 REFERRING MD: ED physician cHIEF COMPLAINT: Fall  BRIEF HISTORY:    68 year old lady with squamous cell carcinoma of the epiglottis coming in with hypotension and septic shock requiring vasopressors. -stage IVB(she is s/p 5 cycles of carboplatin & xrt>tolerated poorly) -declined feeding tube   SIGNIFICANT PAST MEDICAL HISTORY   Laryngeal cancer, involving epiglottis currently undergoing chemo and radiation therapy, carotid artery occlusion, heart murmur, hypothyroidism, osteoporosis, hyperlipidemia,  SIGNIFICANT EVENTS:  7/23: Admitted with sepsis required vasoactive drips 7/24: Off pressors more awake growing GPC in blood ready for transfer out of ICU SLP ordered STUDIES:   Chest x-ray 02/04/2019 no pulmonary filtrate CULTURES:  Blood culture>> gram-positive cocci  ANTIBIOTICS:  Cefepime Zosyn 02/21/2019 Vancomycin 02/21/2019  LINES/TUBES:   Port-A-Cath CONSULTANTS:    SUBJECTIVE:  Feels better, no acute events overnight  CONSTITUTIONAL: BP 127/76 (BP Location: Left Arm)   Pulse (!) 103   Temp (!) 97.5 F (36.4 C) (Core)   Resp 10   Ht 5\' 6"  (1.676 m)   Wt 79.7 kg   SpO2 100%   BMI 28.36 kg/m   I/O last 3 completed shifts: In: 2978.9 [I.V.:1946.2; IV Piggyback:1032.7] Out: 3450 [Urine:3450]        PHYSICAL EXAM: General: NAD. HEENT: [place]/AT; healed surgical scars. Dentition and lips appear to have some change/scab consistent with XRT Pulmonary: Coarse scattered rhonchi Cardiac: Regular rate and rhythm Abdomen: Soft nontender Extremities: Warm dry brisk cap refill diffuse and multiple areas of ecchymosis GU: Voids Neuro: A/O x3; following requests. Conversant. CNII-XII grossly intact.  RESOLVED PROBLEM LIST  Septic shock (off pressors 7/23) Acute hypoxic respiratory failure  Vomiting  ASSESSMENT  AND PLAN    Sepsis w/ GPC bacteremia and presumed aspiration PNA pcxr personally reviewed: mild bibasilar R>L infiltrates.  Plan F/u cultures  Vanc/zosyn day 2 ECHO Cont IVFs Swallow eval   Presumed adrenal insufficiency Plan Taper stress dose steroids   Fluid and electrolyte imbalance: hypernatremia, hypokalemia  Plan Cont d51/2; add KCL Cont KCL replacement  Afternoon chem  Thrombocytopenia/pancytopenia Plan Trending cbc SCDs  Hypothyroidism  Plan Cont replacement   Recurrent vomiting Plan PRNs  Squamous cell carcinoma of the epiglottis on chemotherapy/radiation Plan F/u outpt  She would benefit from palliative consult     Best Practice / Goals of Care / Disposition.   DVT PROPHYLAXIS: SCDs SUP: None NUTRITION: N.p.o. MOBILITY: Bedrest GOALS OF CARE: Full code FAMILY DISCUSSIONS: No family available DISPOSITION   Summary: resolving sepsis, not requiring pressors or vent support.  Will sign off for now, remain available.   Bonna Gains, MD PhD 02/23/19 8:49 AM

## 2019-02-23 NOTE — Progress Notes (Signed)
Patients o2 sats were 68% on room air. RT gave breathing treatment on 8 L and oxygen level went up yo 99%. RT placed patient on 3 L [place] to keep sats up. Sats were 98% on 3L.RN notified. RT will continue to monitor.

## 2019-02-23 NOTE — Progress Notes (Signed)
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  Patient fell at home, without loss of consciousness.  Currently on liquid diet and keeping up with oral intake apparently.  In the ER hypotensive hypothermic, admitted to ICU with septic shock suspected to be aspiration pneumonia her cancer is stage IVB(she is s/p 5 cycles of carboplatin & xrt>tolerated poorly) and declined feeding tube.  Subjective: Resting on RA. No new complaints. Denies CP/SOB. Having some cough. Voice is hoarse baseline. Has cough:and expectorated significant amount of thick cough. BP stable and afebrile overnight  Assessment & Plan:  Streptococcus anginosis sepsis/septic shock: Off pressor vitals stable.  Discussed with Dr. Linus Salmons from infectious disease, pharmacy consulted to switch antibiotics to penicillin to narrow the spectrum, echo reviewed no significant acute finding, ID does not recommend TEE unless persistently bacteremic. Will need to take her port out-and, repeat blood culture.  Continue antibiotics 2 weeks after port is out.  Steroids discontinued. I discussed with Dr. Marin Olp from oncology who agrees with plan to remove Port-A-Cath.  Suspected aspiration pneumonia: Continue suctioning, add Mucomyst nebs. P.o. seen by speech, at risk for aspiration.  Continue IV fluid hydration. cxr 7/24-"Interval development of mild bibasilar atelectasis/infiltrate.Possible pneumonia".  Antibiotics as above, continue aspiration precaution  Hypothyroidism: Continue Synthroid once able to take p.o.  HLD: Continue her statin once able to take p.o.  HTN: Blood pressure stable holding of meds  Malignant neoplasm of supraglottis:  On radiation and chemo, had refused feeding tube in the past.  Hypokalemia/hypernatremia: Continue hypotonic fluids, electrolyte replacement  Pancytopenia : WBC 1700 hemoglobin 8.5 g,Platelet 70k, monitor. Patient is on chemo  Recurrent vomiting currently denies any vomiting.  Continue supportive care  DVT prophylaxis: SCD Code Status:  Family Communication: plan of care discussed with patient in detail. Will update family Disposition Plan: Remains inpatient pending clinical improvement.  Consultants:  PCCM  Procedures:  TTE 7/24  1. The left ventricle has normal systolic function, with an ejection fraction of 55-60%. The cavity size was normal. There is mild concentric left ventricular hypertrophy. Left ventricular diastolic Doppler parameters are consistent with impaired  relaxation. Elevated left ventricular end-diastolic pressure.  2. The right ventricle has normal systolic function. The cavity was normal. There is no increase in right ventricular wall thickness. Right ventricular systolic pressure is normal.  3. There is severe mitral annular calcification present.  4. The aortic valve is bicuspid. Severely thickening of the aortic valve. Severe calcifcation of the aortic valve. Moderate stenosis of the aortic valve.  5. The aorta is normal in size and structure.  SUMMARY   Aortic valve is possibly bicuspid with severely thickened and calcified leaflets with severely restricted leaflets opening and moderate aortic stenosis (peak/mean transaortic gradients 36/20 MmHg).  Microbiology: Streptococcus anginosis in blood culture  Antimicrobials: Anti-infectives (From admission, onward)   Start     Dose/Rate Route Frequency Ordered Stop   02/22/19 0600  vancomycin (VANCOCIN) 1,500 mg in sodium chloride 0.9 % 500 mL IVPB  Status:  Discontinued     1,500 mg 250 mL/hr over 120 Minutes Intravenous Every 24 hours 02/21/19 0145 02/22/19 0916   02/21/19 0600  piperacillin-tazobactam  (ZOSYN) IVPB 3.375 g     3.375 g 12.5  mL/hr over 240 Minutes Intravenous Every 8 hours 02/21/19 0215     02/21/19 0600  ceFAZolin (ANCEF) IVPB 2g/100 mL premix  Status:  Discontinued     2 g 200 mL/hr over 30 Minutes Intravenous On call to O.R. 02/21/19 0218 02/21/19 1155   02/21/19 0130  vancomycin (VANCOCIN) 1,750 mg in sodium chloride 0.9 % 500 mL IVPB     1,750 mg 250 mL/hr over 120 Minutes Intravenous  Once 02/16/2019 2355 02/21/19 0620   02/21/19 0000  ceFEPIme (MAXIPIME) 2 g in sodium chloride 0.9 % 100 mL IVPB  Status:  Discontinued     2 g 200 mL/hr over 30 Minutes Intravenous Every 8 hours 02/22/2019 2349 02/21/19 0214       Objective: Vitals:   02/22/19 2100 02/22/19 2200 02/22/19 2300 02/23/19 0422  BP: 113/68 119/74 115/68   Pulse: 99 (!) 106 (!) 103   Resp: 14 15 18    Temp: 98.4 F (36.9 C) 97.9 F (36.6 C) 97.7 F (36.5 C)   TempSrc:      SpO2: 100% 100% 99%   Weight:    79.7 kg  Height:        Intake/Output Summary (Last 24 hours) at 02/23/2019 0826 Last data filed at 02/23/2019 0537 Gross per 24 hour  Intake 1189.21 ml  Output 1600 ml  Net -410.79 ml   Filed Weights   02/21/19 1448 02/22/19 0610 02/23/19 0422  Weight: 78 kg 78.5 kg 79.7 kg   Weight change: 1.7 kg  Body mass index is 28.36 kg/m.  Intake/Output from previous day: 07/24 0701 - 07/25 0700 In: 1713.8 [I.V.:904.9; IV Piggyback:808.8] Out: 1600 [Urine:1600] Intake/Output this shift: No intake/output data recorded.  Examination:  General exam: Appears calm and comfortable, hoarse voice , on RA HEENT:PERRL,Oral mucosa moist, Ear/Nose normal on gross exam Respiratory system: Bilateral equal air entry, COARSE BREATH SOUNDS. Rt chest port+  Cardiovascular system: S1 & S2 heard,No JVD, murmur +. Gastrointestinal system: Abdomen is  soft, non tender, non distended, BS +  Nervous System:Alert and oriented. No focal neurological deficits/moving extremities, sensation intact. Extremities: No  edema, no clubbing, distal peripheral pulses palpable. Skin: No rashes, lesions, no icterus. extensive bruises+ MSK: Normal muscle bulk,tone ,power  Medications:  Scheduled Meds: . aspirin EC  81 mg Oral Daily  . chlorhexidine  15 mL Mouth Rinse BID  . Chlorhexidine Gluconate Cloth  6 each Topical Daily  . clopidogrel  75 mg Oral Daily  . levothyroxine  56 mcg Intravenous Q0600  . mouth rinse  15 mL Mouth Rinse q12n4p  . potassium chloride  40 mEq Oral Once  . simvastatin  40 mg Oral QHS  . sodium chloride flush  3 mL Intravenous Q12H   Continuous Infusions: . dextrose 5 % and 0.45 % NaCl with KCl 40 mEq/L 50 mL/hr at 02/23/19 0625  . piperacillin-tazobactam (ZOSYN)  IV 3.375 g (02/23/19 0531)    Data Reviewed: I have personally reviewed following labs and imaging studies  CBC: Recent Labs  Lab 02/21/2019 1718 02/21/19 0510 02/22/19 0351  WBC 2.8* 5.9 2.5*  NEUTROABS 2.5  --   --   HGB 10.5* 9.5* 8.6*  HCT 30.3* 28.7* 25.7*  MCV 92.4 95.7 95.9  PLT 79* 110* 69*   Basic Metabolic Panel: Recent Labs  Lab 01/30/2019 1802 02/21/19 0510 02/22/19 0351 02/22/19 1330 02/22/19 2013 02/23/19 0418  NA  --  148* 146* 148* 147* 146*  K  --  3.4* 2.6* 3.7 3.6 3.8  CL  --  108 106 109 109 106  CO2  --  23 31 33* 32 32  GLUCOSE  --  122* 167* 139* 116* 116*  BUN  --  30* 17 14 12 11   CREATININE  --  0.73 0.81 0.67 0.71 0.61  CALCIUM  --  8.6* 8.2* 8.2* 8.3* 8.2*  MG 2.1 2.1  --   --   --   --    GFR: Estimated Creatinine Clearance: 70.7 mL/min (by C-G formula based on SCr of 0.61 mg/dL). Liver Function Tests: Recent Labs  Lab 02/14/2019 1718 02/22/19 0351  AST 21 16  ALT 29 22  ALKPHOS 45 36*  BILITOT 1.5* 1.0  PROT 6.5 5.2*  ALBUMIN 3.8 3.0*   No results for input(s): LIPASE, AMYLASE in the last 168 hours. No results for input(s): AMMONIA in the last 168 hours. Coagulation Profile: No results for input(s): INR, PROTIME in the last 168 hours. Cardiac Enzymes: No  results for input(s): CKTOTAL, CKMB, CKMBINDEX, TROPONINI in the last 168 hours. BNP (last 3 results) No results for input(s): PROBNP in the last 8760 hours. HbA1C: No results for input(s): HGBA1C in the last 72 hours. CBG: No results for input(s): GLUCAP in the last 168 hours. Lipid Profile: No results for input(s): CHOL, HDL, LDLCALC, TRIG, CHOLHDL, LDLDIRECT in the last 72 hours. Thyroid Function Tests: Recent Labs    02/18/2019 2328  TSH 20.091*   Anemia Panel: No results for input(s): VITAMINB12, FOLATE, FERRITIN, TIBC, IRON, RETICCTPCT in the last 72 hours. Sepsis Labs: Recent Labs  Lab 02/14/2019 1719 02/23/2019 1940  LATICACIDVEN 1.3 1.0    Recent Results (from the past 240 hour(s))  Blood culture (routine x 2)     Status: Abnormal   Collection Time: 02/24/2019  6:02 PM   Specimen: BLOOD  Result Value Ref Range Status   Specimen Description   Final    BLOOD RIGHT ANTECUBITAL Performed at [facility] [address] [place], [place] [address]    Special Requests   Final    BOTTLES DRAWN AEROBIC AND ANAEROBIC Blood Culture adequate volume Performed at [facility] [name] [place], [place] [address]    Culture  Setup Time   Final    ANAEROBIC BOTTLE ONLY GRAM POSITIVE COCCI CRITICAL VALUE NOTED.  VALUE IS CONSISTENT WITH PREVIOUSLY REPORTED AND CALLED VALUE.    Culture (A)  Final    STREPTOCOCCUS ANGINOSIS SUSCEPTIBILITIES PERFORMED ON PREVIOUS CULTURE WITHIN THE LAST 5 DAYS. Performed at [facility], [place] [address] [place], [place] [address]    Report Status 02/23/2019 FINAL  Final  Blood culture (routine x 2)     Status: Abnormal   Collection Time: 02/10/2019  6:02 PM   Specimen: BLOOD  Result Value Ref Range Status   Specimen Description   Final    BLOOD RIGHT CHEST Performed at [facility] [address], [place], [place] [address]    Special Requests   Final    BOTTLES DRAWN AEROBIC  AND ANAEROBIC Blood Culture adequate volume Performed at [facility] [address] [place], [place] [address]    Culture  Setup Time   Final    GRAM POSITIVE COCCI IN CHAINS IN BOTH AEROBIC AND ANAEROBIC BOTTLES CRITICAL RESULT CALLED TO, READ BACK BY AND VERIFIED WITHSeleta Rhymes [facility] 5400 02/21/19 A BROWNING Performed at [facility], [place] [address] [place], [place] [address]  Culture STREPTOCOCCUS ANGINOSIS (A)  Final   Report Status 02/23/2019 FINAL  Final   Organism ID, Bacteria STREPTOCOCCUS ANGINOSIS  Final      Susceptibility   Streptococcus anginosis - MIC*    PENICILLIN <=0.06 SENSITIVE Sensitive     CEFTRIAXONE 0.25 SENSITIVE Sensitive     ERYTHROMYCIN <=0.12 SENSITIVE Sensitive     LEVOFLOXACIN 0.5 SENSITIVE Sensitive     VANCOMYCIN 1 SENSITIVE Sensitive     * STREPTOCOCCUS ANGINOSIS  Blood Culture ID Panel (Reflexed)     Status: Abnormal   Collection Time: 02/02/2019  6:02 PM  Result Value Ref Range Status   Enterococcus species NOT DETECTED NOT DETECTED Final   Listeria monocytogenes NOT DETECTED NOT DETECTED Final   Staphylococcus species NOT DETECTED NOT DETECTED Final   Staphylococcus aureus (BCID) NOT DETECTED NOT DETECTED Final   Streptococcus species DETECTED (A) NOT DETECTED Final    Comment: Not Enterococcus species, Streptococcus agalactiae, Streptococcus pyogenes, or Streptococcus pneumoniae. CRITICAL RESULT CALLED TO, READ BACK BY AND VERIFIED WITH: Seleta Rhymes PHARMD [id number] 02/21/19 A BROWNING    Streptococcus agalactiae NOT DETECTED NOT DETECTED Final   Streptococcus pneumoniae NOT DETECTED NOT DETECTED Final   Streptococcus pyogenes NOT DETECTED NOT DETECTED Final   Acinetobacter baumannii NOT DETECTED NOT DETECTED Final   Enterobacteriaceae species NOT DETECTED NOT DETECTED Final   Enterobacter cloacae complex NOT DETECTED NOT DETECTED Final   Escherichia coli NOT DETECTED NOT DETECTED Final   Klebsiella oxytoca NOT DETECTED  NOT DETECTED Final   Klebsiella pneumoniae NOT DETECTED NOT DETECTED Final   Proteus species NOT DETECTED NOT DETECTED Final   Serratia marcescens NOT DETECTED NOT DETECTED Final   Haemophilus influenzae NOT DETECTED NOT DETECTED Final   Neisseria meningitidis NOT DETECTED NOT DETECTED Final   Pseudomonas aeruginosa NOT DETECTED NOT DETECTED Final   Candida albicans NOT DETECTED NOT DETECTED Final   Candida glabrata NOT DETECTED NOT DETECTED Final   Candida krusei NOT DETECTED NOT DETECTED Final   Candida parapsilosis NOT DETECTED NOT DETECTED Final   Candida tropicalis NOT DETECTED NOT DETECTED Final    Comment: Performed at [facility], [place] [address], [place], [place] [address]  SARS Coronavirus 2 (CEPHEID - Performed in [facility] hospital lab), Hosp Order     Status: None   Collection Time: 02/11/2019  8:59 PM   Specimen: Nasopharyngeal Swab  Result Value Ref Range Status   SARS Coronavirus 2 NEGATIVE NEGATIVE Final    Comment: (NOTE) If result is NEGATIVE SARS-CoV-2 target nucleic acids are NOT DETECTED. The SARS-CoV-2 RNA is generally detectable in upper and lower  respiratory specimens during the acute phase of infection. The lowest  concentration of SARS-CoV-2 viral copies this assay can detect is 250  copies / mL. A negative result does not preclude SARS-CoV-2 infection  and should not be used as the sole basis for treatment or other  patient management decisions.  A negative result may occur with  improper specimen collection / handling, submission of specimen other  than nasopharyngeal swab, presence of viral mutation(s) within the  areas targeted by this assay, and inadequate number of viral copies  (<250 copies / mL). A negative result must be combined with clinical  observations, patient history, and epidemiological information. If result is POSITIVE SARS-CoV-2 target nucleic acids are DETECTED. The SARS-CoV-2 RNA is generally detectable in upper and lower   respiratory specimens dur ing the acute phase of infection.  Positive  results are indicative of active infection  with SARS-CoV-2.  Clinical  correlation with patient history and other diagnostic information is  necessary to determine patient infection status.  Positive results do  not rule out bacterial infection or co-infection with other viruses. If result is PRESUMPTIVE POSTIVE SARS-CoV-2 nucleic acids MAY BE PRESENT.   A presumptive positive result was obtained on the submitted specimen  and confirmed on repeat testing.  While 2019 novel coronavirus  (SARS-CoV-2) nucleic acids may be present in the submitted sample  additional confirmatory testing may be necessary for epidemiological  and / or clinical management purposes  to differentiate between  SARS-CoV-2 and other Sarbecovirus currently known to infect humans.  If clinically indicated additional testing with an alternate test  methodology [phone]) is advised. The SARS-CoV-2 RNA is generally  detectable in upper and lower respiratory sp ecimens during the acute  phase of infection. The expected result is Negative. Fact Sheet for Patients:  StrictlyIdeas.no Fact Sheet for Healthcare Providers: BankingDealers.co.za This test is not yet approved or cleared by the [place] FDA and has been authorized for detection and/or diagnosis of SARS-CoV-2 by FDA under an Emergency Use Authorization (EUA).  This EUA will remain in effect (meaning this test can be used) for the duration of the COVID-19 declaration under Section 564(b)(1) of the Act, 21 U.S.C. section 360bbb-3(b)(1), unless the authorization is terminated or revoked sooner. Performed at [facility], [place] [address] [place], [place] [address]   MRSA PCR Screening     Status: None   Collection Time: 02/21/19  2:56 PM   Specimen: Nasal Mucosa; Nasopharyngeal  Result Value Ref Range Status   MRSA by PCR  NEGATIVE NEGATIVE Final    Comment:        The GeneXpert MRSA Assay (FDA approved for NASAL specimens only), is one component of a comprehensive MRSA colonization surveillance program. It is not intended to diagnose MRSA infection nor to guide or monitor treatment for MRSA infections. Performed at [facility], [place] [address] [place], [place] [address]       Radiology Studies: Dg Chest Port 1 View  Result Date: 02/22/2019 CLINICAL DATA:  Pneumonia EXAM: PORTABLE CHEST 1 VIEW COMPARISON:  02/24/2019 FINDINGS: Mild bibasilar airspace disease has developed since the prior study. Negative for heart failure or effusion. Heart size normal. Port-A-Cath tip at the cavoatrial junction unchanged. IMPRESSION: Interval development of mild bibasilar atelectasis/infiltrate. Possible pneumonia. Electronically Signed   By: Franchot Gallo M.D.   On: 02/22/2019 08:03   Dg Swallowing Func-speech Pathology  Result Date: 02/22/2019 Objective Swallowing Evaluation: Type of Study: MBS-Modified Barium Swallow Study  Patient Details Name: Gloria Murray MRN: [id number] Date of Birth: 18-Oct-1950 Today's Date: 02/22/2019 Time: SLP Start Time (ACUTE ONLY): 1415 -SLP Stop Time (ACUTE ONLY): 1450 SLP Time Calculation (min) (ACUTE ONLY): 35 min Past Medical History: Past Medical History: Diagnosis Date . Allergy  . Carotid artery occlusion  . Heart murmur  . Hyperlipidemia  . Hypertension  . Hypothyroidism  . Laryngeal mass  . Osteoporosis  . Thyroid disease  . Wears glasses  . Wears upper complete and lower partial dentures  Past Surgical History: Past Surgical History: Procedure Laterality Date . ARCH AORTOGRAM N/A 07/23/2014  Procedure: ARCH AORTOGRAM;  Surgeon: Rosetta Posner, MD;  Location: [facility] CATH LAB;  Service: Cardiovascular;  Laterality: N/A; . CAROTID ANGIOGRAM N/A 07/23/2014  Procedure: CAROTID ANGIOGRAM;  Surgeon: Rosetta Posner, MD;  Location: [facility] CATH LAB;  Service: Cardiovascular;  Laterality: N/A;  . CAROTID  ENDARTERECTOMY  12/11/2008  left . CAROTID STENT INSERTION Left 10/14/2014  Procedure: CAROTID STENT INSERTION;  Surgeon: Serafina Mitchell, MD;  Location: [facility] CATH LAB;  Service: Cardiovascular;  Laterality: Left;   carotid . CARPAL TUNNEL RELEASE    X 2, right wrist . COLONOSCOPY  Jan. 2, 2015 . DILATION AND CURETTAGE OF UTERUS    X 2 . IR IMAGING GUIDED PORT INSERTION  12/06/2018 . laceration hand Right   hand . LARYNGOSCOPY N/A 11/08/2018  Procedure: DIRECT MICROLARYNGOSCOPY WITH BIOPSY;  Surgeon: Melida Quitter, MD;  Location: [facility];  Service: ENT;  Laterality: N/A; . RIGID ESOPHAGOSCOPY N/A 11/08/2018  Procedure: ESOPHAGOSCOPY;  Surgeon: Melida Quitter, MD;  Location: [facility];  Service: ENT;  Laterality: N/A; . THYROIDECTOMY   . TUBAL LIGATION  1975  bilateral HPI: 68 yo female with treatment for Laryngeal cancer, involving epiglottis currently undergoing chemo and radiation therapy, carotid artery occlusion, heart murmur, hypothyroidism, osteoporosis, hyperlipidemia.  Pt CXR 02/22/2019 showed Interval development of mild bibasilar atelectasis/infiltrate.  Pt had n/v prior to admission ? source of pna but with head and neck cancer, pharyngeal dysphagia may contribute.  Subjective: pt awake in bed Assessment / Plan / Recommendation CHL IP CLINICAL IMPRESSIONS 02/22/2019 Clinical Impression Patient presents with severe oropharyngeal dysphagia due to XRT, edema and decreased motility.  She is aspirating secretions and barium intake *largely silently* and unable to clear aspirates or with cued cough/expectoration.  Delayed oral transiting noted resulting in premature spillage of barium into pharynx.  Swallow is weak and pharyngeal trigger is delayed resulting in laryngeal penetration and aspiration and significant residuals. Various postures including chin tuck with and without head turn left *side with edema* and neck extension did not improve airway protection nor pharyngeal clearance.  At this time, swallow  dysfunction does not allow pt to consume po efficiently or without aspiration.  Educated her to findings using teach back and live feed.   Will follow up for swallowing exercises and indication for repeat instrumental swallow evaluation.   Hopeful for continued swallow function with decreased edema and healing from XRT.  Recommend pt be able to consume water and ice chips freely after oral care to decrease disuse muscle atrophy.        SLP Visit Diagnosis Dysphagia, oropharyngeal phase (R13.12) Attention and concentration deficit following -- Frontal lobe and executive function deficit following -- Impact on safety and function --   CHL IP TREATMENT RECOMMENDATION 02/22/2019 Treatment Recommendations Therapy as outlined in treatment plan below   Prognosis 02/22/2019 Prognosis for Safe Diet Advancement Guarded Barriers to Reach Goals Severity of deficits Barriers/Prognosis Comment -- CHL IP DIET RECOMMENDATION 02/22/2019 SLP Diet Recommendations NPO;Free water protocol after oral care Liquid Administration via -- Medication Administration Via alternative means Compensations -- Postural Changes --   CHL IP OTHER RECOMMENDATIONS 02/22/2019 Recommended Consults -- Oral Care Recommendations Oral care QID Other Recommendations Have oral suction available   CHL IP FOLLOW UP RECOMMENDATIONS 02/22/2019 Follow up Recommendations Home health SLP   CHL IP FREQUENCY AND DURATION 02/22/2019 Speech Therapy Frequency (ACUTE ONLY) min 2x/week Treatment Duration 2 weeks      CHL IP ORAL PHASE 02/22/2019 Oral Phase Impaired Oral - Pudding Teaspoon -- Oral - Pudding Cup -- Oral - Honey Teaspoon -- Oral - Honey Cup -- Oral - Nectar Teaspoon Delayed oral transit;Reduced posterior propulsion;Premature spillage;Decreased bolus cohesion;Weak lingual manipulation Oral - Nectar Cup Delayed oral transit;Reduced posterior propulsion;Premature spillage;Decreased bolus cohesion;Weak lingual manipulation Oral - Nectar Straw -- Oral -  Thin Teaspoon  Delayed oral transit;Reduced posterior propulsion;Premature spillage;Decreased bolus cohesion;Weak lingual manipulation Oral - Thin Cup Delayed oral transit;Reduced posterior propulsion;Premature spillage;Decreased bolus cohesion;Weak lingual manipulation Oral - Thin Straw Reduced posterior propulsion;Delayed oral transit;Premature spillage;Decreased bolus cohesion;Weak lingual manipulation Oral - Puree Delayed oral transit;Reduced posterior propulsion;Premature spillage;Decreased bolus cohesion;Weak lingual manipulation Oral - Mech Soft -- Oral - Regular -- Oral - Multi-Consistency -- Oral - Pill -- Oral Phase - Comment --  CHL IP PHARYNGEAL PHASE 02/22/2019 Pharyngeal Phase Impaired Pharyngeal- Pudding Teaspoon -- Pharyngeal -- Pharyngeal- Pudding Cup -- Pharyngeal -- Pharyngeal- Honey Teaspoon -- Pharyngeal -- Pharyngeal- Honey Cup -- Pharyngeal -- Pharyngeal- Nectar Teaspoon -- Pharyngeal -- Pharyngeal- Nectar Cup Reduced pharyngeal peristalsis;Reduced epiglottic inversion;Reduced anterior laryngeal mobility;Reduced laryngeal elevation;Reduced airway/laryngeal closure;Reduced tongue base retraction Pharyngeal Material enters airway, passes BELOW cords without attempt by patient to eject out (silent aspiration) Pharyngeal- Nectar Straw -- Pharyngeal -- Pharyngeal- Thin Teaspoon Reduced pharyngeal peristalsis;Reduced epiglottic inversion;Reduced laryngeal elevation;Reduced airway/laryngeal closure;Reduced tongue base retraction;Pharyngeal residue - valleculae;Pharyngeal residue - pyriform;Penetration/Aspiration during swallow Pharyngeal Material enters airway, passes BELOW cords without attempt by patient to eject out (silent aspiration) Pharyngeal- Thin Cup Reduced pharyngeal peristalsis;Pharyngeal residue - valleculae;Reduced laryngeal elevation;Pharyngeal residue - pyriform;Reduced airway/laryngeal closure;Reduced tongue base retraction;Reduced epiglottic inversion;Penetration/Aspiration during swallow;Moderate  aspiration Pharyngeal Material enters airway, passes BELOW cords without attempt by patient to eject out (silent aspiration) Pharyngeal- Thin Straw Reduced pharyngeal peristalsis;Pharyngeal residue - pyriform;Pharyngeal residue - valleculae;Moderate aspiration;Reduced epiglottic inversion;Reduced anterior laryngeal mobility;Reduced laryngeal elevation;Compensatory strategies attempted (with notebox);Reduced airway/laryngeal closure;Penetration/Apiration after swallow;Penetration/Aspiration during swallow Pharyngeal Material enters airway, passes BELOW cords without attempt by patient to eject out (silent aspiration) Pharyngeal- Puree Reduced epiglottic inversion;Reduced tongue base retraction;Reduced laryngeal elevation;Reduced anterior laryngeal mobility;Reduced pharyngeal peristalsis;Pharyngeal residue - valleculae Pharyngeal -- Pharyngeal- Mechanical Soft -- Pharyngeal -- Pharyngeal- Regular -- Pharyngeal -- Pharyngeal- Multi-consistency -- Pharyngeal -- Pharyngeal- Pill -- Pharyngeal -- Pharyngeal Comment chin tuck not helpful to prevent aspiration, neck extension  CHL IP CERVICAL ESOPHAGEAL PHASE 02/22/2019 Cervical Esophageal Phase WFL Pudding Teaspoon -- Pudding Cup -- Honey Teaspoon -- Honey Cup -- Nectar Teaspoon -- Nectar Cup -- Nectar Straw -- Thin Teaspoon -- Thin Cup -- Thin Straw -- Puree -- Mechanical Soft -- Regular -- Multi-consistency -- Pill -- Cervical Esophageal Comment -- Macario Golds 02/22/2019, 5:01 PM    Luanna Salk, MS [facility] Acute Rehab Services Pager [phone] Office [phone]              LOS: 2 days   Time spent: More than 50% of that time was spent in counseling and/or coordination of care.  Antonieta Pert, MD [facility]  02/23/2019, 8:26 AM

## 2019-02-23 NOTE — Progress Notes (Addendum)
Pharmacy Antibiotic Note  Gloria Murray is a 68 y.o. female admitted on 02/10/2019 after fall and treated for septic shock from aspiration PNA. Subsequently found to have strep anginosus bacteremia.  Pharmacy has been consulted for PCN dosing.  Plan:  Penicillin-G 12 million units IV q12 hr given as continuous infusion (effectively 1 MU/hr)  ID recommending treatment continue for 2 weeks after removal of PAC  Patient's renal function at baseline; pharmacy will follow peripherally for changes in renal function and clinical status   Height: 5\' 6"  (167.6 cm) Weight: 175 lb 11.3 oz (79.7 kg) IBW/kg (Calculated) : 59.3  Temp (24hrs), Avg:98 F (36.7 C), Min:97.5 F (36.4 C), Max:98.4 F (36.9 C)  Recent Labs  Lab 02/16/2019 1718 02/19/2019 1719 02/01/2019 1940 02/21/19 0510 02/22/19 0351 02/22/19 1330 02/22/19 2013 02/23/19 0418 02/23/19 0829  WBC 2.8*  --   --  5.9 2.5*  --   --   --  1.7*  CREATININE 0.85  --   --  0.73 0.81 0.67 0.71 0.61  --   LATICACIDVEN  --  1.3 1.0  --   --   --   --   --   --     Estimated Creatinine Clearance: 70.7 mL/min (by C-G formula based on SCr of 0.61 mg/dL).    Allergies  Allergen Reactions  . Propoxyphene Hcl Itching and Nausea Only    REACTION: nausea and itch  . Propoxyphene N-Acetaminophen Itching and Nausea Only    REACTION: nausea and  itch    Thank you for allowing pharmacy to be a part of this patient's care.  Reuel Boom, PharmD, BCPS [phone] 02/23/2019, 3:15 PM

## 2019-02-24 DIAGNOSIS — C760 Malignant neoplasm of head, face and neck: Secondary | ICD-10-CM

## 2019-02-24 DIAGNOSIS — R29898 Other symptoms and signs involving the musculoskeletal system: Secondary | ICD-10-CM

## 2019-02-24 LAB — CBC
HCT: 28.1 % — ABNORMAL LOW (ref 36.0–46.0)
Hemoglobin: 9.2 g/dL — ABNORMAL LOW (ref 12.0–15.0)
MCH: 31.4 pg (ref 26.0–34.0)
MCHC: 32.7 g/dL (ref 30.0–36.0)
MCV: 95.9 fL (ref 80.0–100.0)
Platelets: 67 10*3/uL — ABNORMAL LOW (ref 150–400)
RBC: 2.93 MIL/uL — ABNORMAL LOW (ref 3.87–5.11)
RDW: 15.2 % (ref 11.5–15.5)
WBC: 1.4 10*3/uL — CL (ref 4.0–10.5)
nRBC: 1.4 % — ABNORMAL HIGH (ref 0.0–0.2)

## 2019-02-24 LAB — BASIC METABOLIC PANEL
Anion gap: 8 (ref 5–15)
BUN: 9 mg/dL (ref 8–23)
CO2: 35 mmol/L — ABNORMAL HIGH (ref 22–32)
Calcium: 8.2 mg/dL — ABNORMAL LOW (ref 8.9–10.3)
Chloride: 99 mmol/L (ref 98–111)
Creatinine, Ser: 0.4 mg/dL — ABNORMAL LOW (ref 0.44–1.00)
GFR calc Af Amer: >60 mL/min (ref >60)
GFR calc non Af Amer: >60 mL/min (ref >60)
Glucose, Bld: 148 mg/dL — ABNORMAL HIGH (ref 70–99)
Potassium: 3.8 mmol/L (ref 3.5–5.1)
Sodium: 142 mmol/L (ref 135–145)

## 2019-02-24 MED ORDER — TBO-FILGRASTIM 480 MCG/0.8ML ~~LOC~~ SOSY
480.0000 ug | PREFILLED_SYRINGE | Freq: Every day | SUBCUTANEOUS | Status: DC
  Administered 2019-02-24: 480 ug via SUBCUTANEOUS
  Filled 2019-02-24 (×2): qty 0.8

## 2019-02-24 MED ORDER — ASPIRIN 300 MG RE SUPP
300.0000 mg | Freq: Every day | RECTAL | Status: DC
  Administered 2019-02-24 – 2019-02-26 (×3): 300 mg via RECTAL
  Filled 2019-02-24 (×4): qty 1

## 2019-02-24 NOTE — Consult Note (Signed)
Referral MD  Reason for Referral: Streptococcal bacteremia/possible Port-A-Cath infection; stage IV head neck cancer-status post radiation chemotherapy  Chief Complaint  Patient presents with  . Weakness  . Cancer  : Patient really cannot give much history.  HPI: Gloria Murray is a very nice 68 year old white female.  She is followed by Dr.Zhao in our office.  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  She had undergone aggressive radiation and chemotherapy which she had a very hard time with.  She appeared to complete her treatments about a month ago.  She got weekly carboplatinum in addition to radiation therapy.  She last saw Dr. Maylon Peppers on July 15.  He was planning to get follow-up scans on her to see how she responded.  She has been in the hospital now for 4 days.  She came in with sepsis.  Her blood cultures grew out Streptococcus.  She has the Port-A-Cath in.   She did have an echocardiogram on 02/22/2019.  She has severely thickened aortic valve with moderate stenosis.  Her Port-A-Cath will have to come out.  This will take place tomorrow.  Her white cell count is trending downward.  Today, her white cell count is 1.4.  Hemoglobin 9.2.  Platelet count 67,000.  She is eating a little bit.  She apparently has had a very hard time getting in nutrition.  She did not want a feeding tube.  She has had no obvious diarrhea.  Her overall performance status is ECOG 2-3.    Past Medical History:  Diagnosis Date  . Allergy   . Carotid artery occlusion   . Heart murmur   . Hyperlipidemia   . Hypertension   . Hypothyroidism   . Laryngeal mass   . Osteoporosis   . Thyroid disease   . Wears glasses   . Wears upper complete and lower partial dentures   :  Past Surgical History:  Procedure Laterality Date  . ARCH AORTOGRAM N/A 07/23/2014   Procedure: ARCH AORTOGRAM;  Surgeon: Rosetta Posner, MD;  Location: [facility] CATH LAB;  Service: Cardiovascular;  Laterality:  N/A;  . CAROTID ANGIOGRAM N/A 07/23/2014   Procedure: CAROTID ANGIOGRAM;  Surgeon: Rosetta Posner, MD;  Location: [facility] CATH LAB;  Service: Cardiovascular;  Laterality: N/A;  . CAROTID ENDARTERECTOMY  12/11/2008   left  . CAROTID STENT INSERTION Left 10/14/2014   Procedure: CAROTID STENT INSERTION;  Surgeon: Serafina Mitchell, MD;  Location: [facility] CATH LAB;  Service: Cardiovascular;  Laterality: Left;   carotid  . CARPAL TUNNEL RELEASE     X 2, right wrist  . COLONOSCOPY  Jan. 2, 2015  . DILATION AND CURETTAGE OF UTERUS     X 2  . IR IMAGING GUIDED PORT INSERTION  12/06/2018  . laceration hand Right    hand  . LARYNGOSCOPY N/A 11/08/2018   Procedure: DIRECT MICROLARYNGOSCOPY WITH BIOPSY;  Surgeon: Melida Quitter, MD;  Location: [place];  Service: ENT;  Laterality: N/A;  . RIGID ESOPHAGOSCOPY N/A 11/08/2018   Procedure: ESOPHAGOSCOPY;  Surgeon: Melida Quitter, MD;  Location: [place];  Service: ENT;  Laterality: N/A;  . THYROIDECTOMY    . TUBAL LIGATION  1975   bilateral  :   Current Facility-Administered Medications:  .  acetaminophen (TYLENOL) tablet 650 mg, 650 mg, Oral, Q6H PRN **OR** acetaminophen (TYLENOL) suppository 650 mg, 650 mg, Rectal, Q6H PRN, Erick Colace, NP .  acetylcysteine (MUCOMYST) 20 % nebulizer / oral solution 4 mL, 4  mL, Nebulization, BID, Kc, Ramesh, MD, 4 mL at 02/23/19 2036 .  albuterol (PROVENTIL) (2.5 MG/3ML) 0.083% nebulizer solution 2.5 mg, 2.5 mg, Nebulization, Q4H PRN, Kc, Ramesh, MD, 2.5 mg at 02/23/19 1122 .  aspirin EC tablet 81 mg, 81 mg, Oral, Daily, Erick Colace, NP, Stopped at 02/21/19 1024 .  chlorhexidine (PERIDEX) 0.12 % solution 15 mL, 15 mL, Mouth Rinse, BID, Salvadore Dom E, NP, 15 mL at 02/23/19 2300 .  Chlorhexidine Gluconate Cloth 2 % PADS 6 each, 6 each, Topical, Daily, Erick Colace, NP, 6 each at 02/23/19 1314 .  clopidogrel (PLAVIX) tablet 75 mg, 75 mg, Oral, Daily, Erick Colace, NP, Stopped at 02/21/19 1025 .  dextrose 5 % and 0.45 % NaCl  with KCl 40 mEq/L infusion, , Intravenous, Continuous, Erick Colace, NP, Last Rate: 50 mL/hr at 02/23/19 1884 .  guaiFENesin (MUCINEX) 12 hr tablet 600 mg, 600 mg, Oral, BID, Kc, Ramesh, MD .  levothyroxine (SYNTHROID, LEVOTHROID) injection 56 mcg, 56 mcg, Intravenous, Q0600, Erick Colace, NP, 56 mcg at 02/23/19 0531 .  lidocaine (XYLOCAINE) 2 % viscous mouth solution 15 mL, 15 mL, Mouth/Throat, Q6H PRN, Erick Colace, NP .  MEDLINE mouth rinse, 15 mL, Mouth Rinse, q12n4p, Salvadore Dom E, NP, 15 mL at 02/23/19 1314 .  ondansetron (ZOFRAN) tablet 4 mg, 4 mg, Oral, Q6H PRN **OR** ondansetron (ZOFRAN) injection 4 mg, 4 mg, Intravenous, Q6H PRN, Erick Colace, NP .  penicillin G potassium 12 Million Units in dextrose 5 % 500 mL continuous infusion, 12 Million Units, Intravenous, BID, Polly Cobia, RPH, Stopped at 02/23/19 2301 .  potassium chloride 20 MEQ/15ML (10%) solution 40 mEq, 40 mEq, Oral, Once, Erick Colace, NP, Stopped at 02/18/2019 2200 .  simvastatin (ZOCOR) tablet 40 mg, 40 mg, Oral, QHS, Erick Colace, NP, Stopped at 02/02/2019 2200 .  sodium chloride flush (NS) 0.9 % injection 3 mL, 3 mL, Intravenous, Q12H, Salvadore Dom E, NP, 3 mL at 02/23/19 2200:  . acetylcysteine  4 mL Nebulization BID  . aspirin EC  81 mg Oral Daily  . chlorhexidine  15 mL Mouth Rinse BID  . Chlorhexidine Gluconate Cloth  6 each Topical Daily  . clopidogrel  75 mg Oral Daily  . guaiFENesin  600 mg Oral BID  . levothyroxine  56 mcg Intravenous Q0600  . mouth rinse  15 mL Mouth Rinse q12n4p  . potassium chloride  40 mEq Oral Once  . simvastatin  40 mg Oral QHS  . sodium chloride flush  3 mL Intravenous Q12H  :  Allergies  Allergen Reactions  . Propoxyphene Hcl Itching and Nausea Only    REACTION: nausea and itch  . Propoxyphene N-Acetaminophen Itching and Nausea Only    REACTION: nausea and  itch  :  Family History  Problem Relation Age of Onset  . Diabetes Mother   . Colon  cancer Neg Hx   :  Social History   Socioeconomic History  . Marital status: Widowed    Spouse name: Not on file  . Number of children: Not on file  . Years of education: Not on file  . Highest education level: Not on file  Occupational History  . Occupation: Retail buyer: [employer]  . Financial resource strain: Not on file  . Food insecurity    Worry: Not on file    Inability: Not on file  . Transportation needs    Medical:  No    Non-medical: No  Tobacco Use  . Smoking status: Former Smoker    Packs/day: 0.50    Types: Cigarettes    Quit date: 07/23/2014    Years since quitting: 4.5  . Smokeless tobacco: Never Used  . Tobacco comment: quit smoking cigarettes in " 2018-19"  Substance and Sexual Activity  . Alcohol use: No    Alcohol/week: 0.0 standard drinks  . Drug use: No  . Sexual activity: Never  Lifestyle  . Physical activity    Days per week: Not on file    Minutes per session: Not on file  . Stress: Not on file  Relationships  . Social Herbalist on phone: Not on file    Gets together: Not on file    Attends religious service: Not on file    Active member of club or organization: Not on file    Attends meetings of clubs or organizations: Not on file    Relationship status: Not on file  . Intimate partner violence    Fear of current or ex partner: No    Emotionally abused: No    Physically abused: No    Forced sexual activity: No  Other Topics Concern  . Not on file  Social History Narrative   Does not have a living will.   Does not have any family.      [place] "make decisions" for her-desires CPR, does not want prolonged life support if futile.      Both of her children- died in car accident at ages 4 and 76- husband went into a diabetic coma while driving.  :  Review of Systems  Constitutional: Positive for malaise/fatigue.  HENT: Positive for sore throat.   Eyes: Negative.   Respiratory: Positive  for shortness of breath.   Cardiovascular: Positive for palpitations.  Gastrointestinal: Positive for nausea.  Genitourinary: Negative.   Musculoskeletal: Positive for myalgias.  Skin: Negative.   Neurological: Positive for weakness.  Endo/Heme/Allergies: Bruises/bleeds easily.  Psychiatric/Behavioral: Negative.      Exam: Chronically ill-appearing white female in no obvious distress.  Vital signs show temperature of 97.2.  Pulse 90.  Blood pressure 111/86.  Head neck exam shows some dried blood around her lips.  She has no obvious intraoral lesions.  There is some firmness on her neck bilaterally.  Her lungs have decent air movement bilaterally.  Cardiac exam is regular rate and rhythm she has 1/6 systolic ejection murmur.  Abdomen is soft.  Bowel sounds are decreased.  Extremities shows some muscle atrophy in upper lower extremities that is mild.  Neurological exam shows no obvious neurological deficits. Patient Vitals for the past 24 hrs:  BP Temp Temp src Pulse Resp SpO2 Weight  02/24/19 0500 111/86 (!) 97.2 F (36.2 C) Axillary 90 16 100 % -  02/23/19 2053 - - - - - 98 % -  02/23/19 2044 114/87 97.8 F (36.6 C) - 86 19 100 % -  02/23/19 2036 - - - - - (!) 62 % -  02/23/19 1623 (!) 122/91 98.2 F (36.8 C) Oral (!) 109 16 98 % 179 lb 14.3 oz (81.6 kg)  02/23/19 1600 - - - (!) 106 15 99 % -  02/23/19 1500 134/78 97.9 F (36.6 C) - 100 (!) 22 97 % -  02/23/19 1400 104/66 98.6 F (37 C) - (!) 105 15 100 % -  02/23/19 1300 104/72 98.8 F (37.1 C) - (!) 103 16  100 % -  02/23/19 1200 115/74 98.4 F (36.9 C) - 100 19 100 % -  02/23/19 1121 - - - - - 100 % -  02/23/19 1100 109/78 98.4 F (36.9 C) - - 16 - -  02/23/19 1000 115/87 98.1 F (36.7 C) - 98 16 99 % -  02/23/19 0900 (!) 131/91 97.7 F (36.5 C) - 98 (!) 21 100 % -     Recent Labs    02/23/19 0829 02/24/19 0322  WBC 1.7* 1.4*  HGB 8.5* 9.2*  HCT 25.9* 28.1*  PLT 70* 67*   Recent Labs    02/23/19 0418  02/24/19 0322  NA 146* 142  K 3.8 3.8  CL 106 99  CO2 32 35*  GLUCOSE 116* 148*  BUN 11 9  CREATININE 0.61 0.40*  CALCIUM 8.2* 8.2*    Blood smear review: None  Pathology: None    Assessment and Plan: Ms. Wisz is a 68 year old white female.  She has locally advanced head and neck cancer.  She had chemoradiation therapy.  This was completed a few weeks ago.  I am not sure why her white cell count is trending downward.  I will get her on Neupogen.  I think this will be helpful..  She is only on penicillin IV.  I do not think this would be leading to neutropenia.  She apparently had issues with her blood counts during her treatments.  Is possible that she still may have some marrow dysfunction.  I noted that her platelet count also was down a little bit.  I do think the Port-A-Cath probably has to come out.  I think given the echocardiogram and the abnormal aortic valve, she probably is at risk for endocarditis.  Typically, Streptococcus bacteria are quite fastidious and like to clean to Port-A-Cath.  I know she has had a tough time with all of her treatments.  I just feel bad for her.  I know she is getting outstanding care by all the staff on [address].  Gloria Haw, MD  Hebrew[place] 12:12

## 2019-02-24 NOTE — Progress Notes (Signed)
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  Patient fell at home, without loss of consciousness.  Currently on liquid diet and keeping up with oral intake apparently.  In the ER hypotensive hypothermic, admitted to ICU with septic shock suspected to be aspiration pneumonia her cancer is stage IVB(she is s/p 5 cycles of carboplatin & xrt>tolerated poorly) and declined feeding tube.  Subjective: Seen and examined. Resting comfortably.  Reports he needs oxygen as needed at home on 2 L. Not much coughing today like other day, afebrile. WBC decreased to 1400-on neutropenic precaution  Assessment & Plan:  Streptococcus anginosis sepsis with septic shock: Off pressor, vitals stable.  Discussed with Dr. Linus Salmons from infectious disease 7/25- pharmacy consultedfor penicillin to narrow the spectrum, echo reviewed no significant acute finding, ID does not recommend TEE unless persistently bacteremic. Will need to take her port out-and, repeat blood culture, IR consulted, likely Monday. Will need antibiotics 2 weeks after port is out. Steroids (for septic shock) discontinued in ICU. I discussed with Dr. Marin Olp from oncology who agrees with plan to remove Port-A-Cath.  Neutropenia unclear etiology likely in the setting of sepsis.  Had chemoradiation therapy but completed a few weeks ago, appreciated oncology input for Neupogen.  Continue neutropenic precaution.  Pancytopenia : Platelet decreasing, WBC decreasing hemoglobin stable.  Monitor.   Recent Labs  Lab 02/22/19 0351 02/23/19 0829 02/24/19 0322  HGB 8.6* 8.5* 9.2*  HCT 25.7* 25.9* 28.1*  WBC 2.5* 1.7* 1.4*  PLT 69* 70* 67*   Suspected  aspiration pneumonia: Continue antibiotics as above along with suctioning, Mucomyst nebs. Continue IV fluid hydration. cxr 7/24-"Interval development of mild bibasilar atelectasis/infiltrate.Possible pneumonia".  Antibiotics as above, continue aspiration precaution  Hypothyroidism: Continue Synthroid iv, and change to PO once able to take p.o.  HLD: Continue her statin once able to take p.o.  HTN: Blood pressure stable holding off meds  Malignant neoplasm of supraglottis: On radiation and chemo, had refused feeding tube in the past.  Completed chemoradiation few wks ago  Hypokalemia/hypernatremia: Improved. On hypotonic fluids, cont gently  Recurrent vomiting currently denies any vomiting.  Continue supportive care  Carotid artery stenosis status post left CEA-on aspirin Plavix, hold as NPO for Port-A-Cath.  Continue rectal aspirin for now.  Goals of care: Patient appears deconditioned with complex comorbidities, cancer.  Continue PT OT.  Need to consider palliative care consultation but await for further oncology recommendation   DVT prophylaxis: SCD Code Status: full.  May need palliative care consultation. Family Communication: plan of care discussed with patient in detail.  When asked if there is any family I can update patient declined the request.   Disposition Plan: Remains inpatient pending clinical improvement.  Discussed with oncology.  Consultants:  PCCM  Procedures:  TTE 7/24  1. The left ventricle has normal systolic function, with an ejection fraction of 55-60%. The cavity size was normal. There is mild concentric left ventricular hypertrophy. Left ventricular diastolic Doppler parameters are consistent with impaired  relaxation. Elevated left ventricular end-diastolic pressure.  2. The right ventricle has normal systolic function. The cavity was normal. There is no increase in right ventricular wall thickness. Right ventricular systolic pressure is normal.  3. There is  severe  mitral annular calcification present.  4. The aortic valve is bicuspid. Severely thickening of the aortic valve. Severe calcifcation of the aortic valve. Moderate stenosis of the aortic valve.  5. The aorta is normal in size and structure.  SUMMARY   Aortic valve is possibly bicuspid with severely thickened and calcified leaflets with severely restricted leaflets opening and moderate aortic stenosis (peak/mean transaortic gradients 36/20 MmHg).  Microbiology: Streptococcus anginosis in blood culture  Antimicrobials: Anti-infectives (From admission, onward)   Start     Dose/Rate Route Frequency Ordered Stop   02/23/19 1300  penicillin G potassium 12 Million Units in dextrose 5 % 500 mL continuous infusion     12 Million Units 41.7 mL/hr over 12 Hours Intravenous 2 times daily 02/23/19 1205     02/22/19 0600  vancomycin (VANCOCIN) 1,500 mg in sodium chloride 0.9 % 500 mL IVPB  Status:  Discontinued     1,500 mg 250 mL/hr over 120 Minutes Intravenous Every 24 hours 02/21/19 0145 02/22/19 0916   02/21/19 0600  piperacillin-tazobactam (ZOSYN) IVPB 3.375 g  Status:  Discontinued     3.375 g 12.5 mL/hr over 240 Minutes Intravenous Every 8 hours 02/21/19 0215 02/23/19 1205   02/21/19 0600  ceFAZolin (ANCEF) IVPB 2g/100 mL premix  Status:  Discontinued     2 g 200 mL/hr over 30 Minutes Intravenous On call to O.R. 02/21/19 0218 02/21/19 1155   02/21/19 0130  vancomycin (VANCOCIN) 1,750 mg in sodium chloride 0.9 % 500 mL IVPB     1,750 mg 250 mL/hr over 120 Minutes Intravenous  Once 02/16/2019 2355 02/21/19 0620   02/21/19 0000  ceFEPIme (MAXIPIME) 2 g in sodium chloride 0.9 % 100 mL IVPB  Status:  Discontinued     2 g 200 mL/hr over 30 Minutes Intravenous Every 8 hours 01/30/2019 2349 02/21/19 0214       Objective: Vitals:   02/23/19 2036 02/23/19 2044 02/23/19 2053 02/24/19 0500  BP:  114/87  111/86  Pulse:  86  90  Resp:  19  16  Temp:  97.8 F (36.6 C)  (!) 97.2 F (36.2  C)  TempSrc:    Axillary  SpO2: (!) 62% 100% 98% 100%  Weight:      Height:        Intake/Output Summary (Last 24 hours) at 02/24/2019 0950 Last data filed at 02/23/2019 1600 Gross per 24 hour  Intake 650 ml  Output 650 ml  Net 0 ml   Filed Weights   02/22/19 0610 02/23/19 0422 02/23/19 1623  Weight: 78.5 kg 79.7 kg 81.6 kg   Weight change: 1.9 kg  Body mass index is 29.04 kg/m.  Intake/Output from previous day: 07/25 0701 - 07/26 0700 In: 650 [I.V.:600; IV Piggyback:50] Out: 650 [Urine:650] Intake/Output this shift: No intake/output data recorded.  Examination: General exam: AAO,NAD, weak/frail/elderly HEENT:Oral mucosa moist, Ear/Nose WNL grossly, dentition normal. Respiratory system: Diminished at the base, wheezing present mostly coming from upper airways, NT,no use of accessory muscle Cardiovascular system: S1 & S2 +, No JVD, regular RR. Gastrointestinal system: Abdomen soft, NT,ND, BS+ Nervous System:Alert, awake, moving extremities and grossly nonfocal Extremities: No edema, distal peripheral pulses palpable.  Skin: No rashes,no icterus.  Bruise present MSK: Normal muscle bulk,tone, power  Medications:  Scheduled Meds: . acetylcysteine  4 mL Nebulization BID  . aspirin  300 mg Rectal Daily  . chlorhexidine  15 mL Mouth Rinse BID  . Chlorhexidine Gluconate Cloth  6 each Topical Daily  . levothyroxine  56 mcg Intravenous Q0600  . mouth rinse  15 mL Mouth Rinse q12n4p  . potassium chloride  40 mEq Oral Once  . sodium chloride flush  3 mL Intravenous Q12H  . Tbo-Filgrastim  480 mcg Subcutaneous q1800   Continuous Infusions: . dextrose 5 % and 0.45 % NaCl with KCl 40 mEq/L 50 mL/hr at 02/23/19 0625  . penicillin g continuous IV infusion Stopped (02/23/19 2301)    Data Reviewed: I have personally reviewed following labs and imaging studies  CBC: Recent Labs  Lab 02/02/2019 1718 02/21/19 0510 02/22/19 0351 02/23/19 0829 02/24/19 0322  WBC 2.8* 5.9 2.5*  1.7* 1.4*  NEUTROABS 2.5  --   --   --   --   HGB 10.5* 9.5* 8.6* 8.5* 9.2*  HCT 30.3* 28.7* 25.7* 25.9* 28.1*  MCV 92.4 95.7 95.9 96.6 95.9  PLT 79* 110* 69* 70* 67*   Basic Metabolic Panel: Recent Labs  Lab 02/11/2019 1802 02/21/19 0510 02/22/19 0351 02/22/19 1330 02/22/19 2013 02/23/19 0418 02/24/19 0322  NA  --  148* 146* 148* 147* 146* 142  K  --  3.4* 2.6* 3.7 3.6 3.8 3.8  CL  --  108 106 109 109 106 99  CO2  --  23 31 33* 32 32 35*  GLUCOSE  --  122* 167* 139* 116* 116* 148*  BUN  --  30* 17 14 12 11 9   CREATININE  --  0.73 0.81 0.67 0.71 0.61 0.40*  CALCIUM  --  8.6* 8.2* 8.2* 8.3* 8.2* 8.2*  MG 2.1 2.1  --   --   --   --   --    GFR: Estimated Creatinine Clearance: 71.5 mL/min (A) (by C-G formula based on SCr of 0.4 mg/dL (L)). Liver Function Tests: Recent Labs  Lab 02/21/2019 1718 02/22/19 0351  AST 21 16  ALT 29 22  ALKPHOS 45 36*  BILITOT 1.5* 1.0  PROT 6.5 5.2*  ALBUMIN 3.8 3.0*   No results for input(s): LIPASE, AMYLASE in the last 168 hours. No results for input(s): AMMONIA in the last 168 hours. Coagulation Profile: No results for input(s): INR, PROTIME in the last 168 hours. Cardiac Enzymes: No results for input(s): CKTOTAL, CKMB, CKMBINDEX, TROPONINI in the last 168 hours. BNP (last 3 results) No results for input(s): PROBNP in the last 8760 hours. HbA1C: No results for input(s): HGBA1C in the last 72 hours. CBG: No results for input(s): GLUCAP in the last 168 hours. Lipid Profile: No results for input(s): CHOL, HDL, LDLCALC, TRIG, CHOLHDL, LDLDIRECT in the last 72 hours. Thyroid Function Tests: No results for input(s): TSH, T4TOTAL, FREET4, T3FREE, THYROIDAB in the last 72 hours. Anemia Panel: No results for input(s): VITAMINB12, FOLATE, FERRITIN, TIBC, IRON, RETICCTPCT in the last 72 hours. Sepsis Labs: Recent Labs  Lab 02/10/2019 1719 02/01/2019 1940  LATICACIDVEN 1.3 1.0    Recent Results (from the past 240 hour(s))  Blood culture  (routine x 2)     Status: Abnormal   Collection Time: 02/23/2019  6:02 PM   Specimen: BLOOD  Result Value Ref Range Status   Specimen Description   Final    BLOOD RIGHT ANTECUBITAL Performed at [facility] [address], [place], [place] [address]    Special Requests   Final    BOTTLES DRAWN AEROBIC AND ANAEROBIC Blood Culture adequate volume Performed at [facility] [address] [place], [place] [address]    Culture  Setup Time   Final  ANAEROBIC BOTTLE ONLY GRAM POSITIVE COCCI CRITICAL VALUE NOTED.  VALUE IS CONSISTENT WITH PREVIOUSLY REPORTED AND CALLED VALUE.    Culture (A)  Final    STREPTOCOCCUS ANGINOSIS SUSCEPTIBILITIES PERFORMED ON PREVIOUS CULTURE WITHIN THE LAST 5 DAYS. Performed at [facility], [place] [address], [place], [place] [address]    Report Status 02/23/2019 FINAL  Final  Blood culture (routine x 2)     Status: Abnormal   Collection Time: 02/21/2019  6:02 PM   Specimen: BLOOD  Result Value Ref Range Status   Specimen Description   Final    BLOOD RIGHT CHEST Performed at [facility] [address] [place], [place] [address]    Special Requests   Final    BOTTLES DRAWN AEROBIC AND ANAEROBIC Blood Culture adequate volume Performed at [facility] [address], [place], [place] [address]    Culture  Setup Time   Final    GRAM POSITIVE COCCI IN CHAINS IN BOTH AEROBIC AND ANAEROBIC BOTTLES CRITICAL RESULT CALLED TO, READ BACK BY AND VERIFIED WITHSeleta Rhymes [facility] 1025 02/21/19 A BROWNING Performed at [facility], [place] [address], [place], [place] [address]    Culture STREPTOCOCCUS ANGINOSIS (A)  Final   Report Status 02/23/2019 FINAL  Final   Organism ID, Bacteria STREPTOCOCCUS ANGINOSIS  Final      Susceptibility   Streptococcus anginosis - MIC*    PENICILLIN <=0.06 SENSITIVE Sensitive     CEFTRIAXONE 0.25 SENSITIVE Sensitive     ERYTHROMYCIN <=0.12  SENSITIVE Sensitive     LEVOFLOXACIN 0.5 SENSITIVE Sensitive     VANCOMYCIN 1 SENSITIVE Sensitive     * STREPTOCOCCUS ANGINOSIS  Blood Culture ID Panel (Reflexed)     Status: Abnormal   Collection Time: 02/03/2019  6:02 PM  Result Value Ref Range Status   Enterococcus species NOT DETECTED NOT DETECTED Final   Listeria monocytogenes NOT DETECTED NOT DETECTED Final   Staphylococcus species NOT DETECTED NOT DETECTED Final   Staphylococcus aureus (BCID) NOT DETECTED NOT DETECTED Final   Streptococcus species DETECTED (A) NOT DETECTED Final    Comment: Not Enterococcus species, Streptococcus agalactiae, Streptococcus pyogenes, or Streptococcus pneumoniae. CRITICAL RESULT CALLED TO, READ BACK BY AND VERIFIED WITH: Seleta Rhymes PHARMD [id number] 02/21/19 A BROWNING    Streptococcus agalactiae NOT DETECTED NOT DETECTED Final   Streptococcus pneumoniae NOT DETECTED NOT DETECTED Final   Streptococcus pyogenes NOT DETECTED NOT DETECTED Final   Acinetobacter baumannii NOT DETECTED NOT DETECTED Final   Enterobacteriaceae species NOT DETECTED NOT DETECTED Final   Enterobacter cloacae complex NOT DETECTED NOT DETECTED Final   Escherichia coli NOT DETECTED NOT DETECTED Final   Klebsiella oxytoca NOT DETECTED NOT DETECTED Final   Klebsiella pneumoniae NOT DETECTED NOT DETECTED Final   Proteus species NOT DETECTED NOT DETECTED Final   Serratia marcescens NOT DETECTED NOT DETECTED Final   Haemophilus influenzae NOT DETECTED NOT DETECTED Final   Neisseria meningitidis NOT DETECTED NOT DETECTED Final   Pseudomonas aeruginosa NOT DETECTED NOT DETECTED Final   Candida albicans NOT DETECTED NOT DETECTED Final   Candida glabrata NOT DETECTED NOT DETECTED Final   Candida krusei NOT DETECTED NOT DETECTED Final   Candida parapsilosis NOT DETECTED NOT DETECTED Final   Candida tropicalis NOT DETECTED NOT DETECTED Final    Comment: Performed at [facility], [place] [address] [place], [place] [address]  SARS  Coronavirus 2 (CEPHEID - Performed in [facility] hospital lab), [facility]  Status: None   Collection Time: 02/13/2019  8:59 PM   Specimen: Nasopharyngeal Swab  Result Value Ref Range Status   SARS Coronavirus 2 NEGATIVE NEGATIVE Final    Comment: (NOTE) If result is NEGATIVE SARS-CoV-2 target nucleic acids are NOT DETECTED. The SARS-CoV-2 RNA is generally detectable in upper and lower  respiratory specimens during the acute phase of infection. The lowest  concentration of SARS-CoV-2 viral copies this assay can detect is 250  copies / mL. A negative result does not preclude SARS-CoV-2 infection  and should not be used as the sole basis for treatment or other  patient management decisions.  A negative result may occur with  improper specimen collection / handling, submission of specimen other  than nasopharyngeal swab, presence of viral mutation(s) within the  areas targeted by this assay, and inadequate number of viral copies  (<250 copies / mL). A negative result must be combined with clinical  observations, patient history, and epidemiological information. If result is POSITIVE SARS-CoV-2 target nucleic acids are DETECTED. The SARS-CoV-2 RNA is generally detectable in upper and lower  respiratory specimens dur ing the acute phase of infection.  Positive  results are indicative of active infection with SARS-CoV-2.  Clinical  correlation with patient history and other diagnostic information is  necessary to determine patient infection status.  Positive results do  not rule out bacterial infection or co-infection with other viruses. If result is PRESUMPTIVE POSTIVE SARS-CoV-2 nucleic acids MAY BE PRESENT.   A presumptive positive result was obtained on the submitted specimen  and confirmed on repeat testing.  While 2019 novel coronavirus  (SARS-CoV-2) nucleic acids may be present in the submitted sample  additional confirmatory testing may be necessary for epidemiological  and / or  clinical management purposes  to differentiate between  SARS-CoV-2 and other Sarbecovirus currently known to infect humans.  If clinically indicated additional testing with an alternate test  methodology [phone]) is advised. The SARS-CoV-2 RNA is generally  detectable in upper and lower respiratory sp ecimens during the acute  phase of infection. The expected result is Negative. Fact Sheet for Patients:  StrictlyIdeas.no Fact Sheet for Healthcare Providers: BankingDealers.co.za This test is not yet approved or cleared by the [place] FDA and has been authorized for detection and/or diagnosis of SARS-CoV-2 by FDA under an Emergency Use Authorization (EUA).  This EUA will remain in effect (meaning this test can be used) for the duration of the COVID-19 declaration under Section 564(b)(1) of the Act, 21 U.S.C. section 360bbb-3(b)(1), unless the authorization is terminated or revoked sooner. Performed at [facility], [place] [address] [place], [place] [address]   MRSA PCR Screening     Status: None   Collection Time: 02/21/19  2:56 PM   Specimen: Nasal Mucosa; Nasopharyngeal  Result Value Ref Range Status   MRSA by PCR NEGATIVE NEGATIVE Final    Comment:        The GeneXpert MRSA Assay (FDA approved for NASAL specimens only), is one component of a comprehensive MRSA colonization surveillance program. It is not intended to diagnose MRSA infection nor to guide or monitor treatment for MRSA infections. Performed at [facility], [place] [address], [place], [place] [address]       Radiology Studies: Dg Swallowing Func-speech Pathology  Result Date: 02/22/2019 Objective Swallowing Evaluation: Type of Study: MBS-Modified Barium Swallow Study  Patient Details Name: Gloria Murray MRN: [id number] Date of Birth: July 16, 1951 Today's Date: 02/22/2019 Time: SLP Start Time (ACUTE  ONLY): 1415 -SLP Stop Time  (ACUTE ONLY): 1450 SLP Time Calculation (min) (ACUTE ONLY): 35 min Past Medical History: Past Medical History: Diagnosis Date . Allergy  . Carotid artery occlusion  . Heart murmur  . Hyperlipidemia  . Hypertension  . Hypothyroidism  . Laryngeal mass  . Osteoporosis  . Thyroid disease  . Wears glasses  . Wears upper complete and lower partial dentures  Past Surgical History: Past Surgical History: Procedure Laterality Date . ARCH AORTOGRAM N/A 07/23/2014  Procedure: ARCH AORTOGRAM;  Surgeon: Rosetta Posner, MD;  Location: [facility] CATH LAB;  Service: Cardiovascular;  Laterality: N/A; . CAROTID ANGIOGRAM N/A 07/23/2014  Procedure: CAROTID ANGIOGRAM;  Surgeon: Rosetta Posner, MD;  Location: [facility] CATH LAB;  Service: Cardiovascular;  Laterality: N/A; . CAROTID ENDARTERECTOMY  12/11/2008  left . CAROTID STENT INSERTION Left 10/14/2014  Procedure: CAROTID STENT INSERTION;  Surgeon: Serafina Mitchell, MD;  Location: [facility] CATH LAB;  Service: Cardiovascular;  Laterality: Left;   carotid . CARPAL TUNNEL RELEASE    X 2, right wrist . COLONOSCOPY  Jan. 2, 2015 . DILATION AND CURETTAGE OF UTERUS    X 2 . IR IMAGING GUIDED PORT INSERTION  12/06/2018 . laceration hand Right   hand . LARYNGOSCOPY N/A 11/08/2018  Procedure: DIRECT MICROLARYNGOSCOPY WITH BIOPSY;  Surgeon: Melida Quitter, MD;  Location: [facility];  Service: ENT;  Laterality: N/A; . RIGID ESOPHAGOSCOPY N/A 11/08/2018  Procedure: ESOPHAGOSCOPY;  Surgeon: Melida Quitter, MD;  Location: [facility];  Service: ENT;  Laterality: N/A; . THYROIDECTOMY   . TUBAL LIGATION  1975  bilateral HPI: 68 yo female with treatment for Laryngeal cancer, involving epiglottis currently undergoing chemo and radiation therapy, carotid artery occlusion, heart murmur, hypothyroidism, osteoporosis, hyperlipidemia.  Pt CXR 02/22/2019 showed Interval development of mild bibasilar atelectasis/infiltrate.  Pt had n/v prior to admission ? source of pna but with head and neck cancer, pharyngeal dysphagia may contribute.  Subjective: pt  awake in bed Assessment / Plan / Recommendation CHL IP CLINICAL IMPRESSIONS 02/22/2019 Clinical Impression Patient presents with severe oropharyngeal dysphagia due to XRT, edema and decreased motility.  She is aspirating secretions and barium intake *largely silently* and unable to clear aspirates or with cued cough/expectoration.  Delayed oral transiting noted resulting in premature spillage of barium into pharynx.  Swallow is weak and pharyngeal trigger is delayed resulting in laryngeal penetration and aspiration and significant residuals. Various postures including chin tuck with and without head turn left *side with edema* and neck extension did not improve airway protection nor pharyngeal clearance.  At this time, swallow dysfunction does not allow pt to consume po efficiently or without aspiration.  Educated her to findings using teach back and live feed.   Will follow up for swallowing exercises and indication for repeat instrumental swallow evaluation.   Hopeful for continued swallow function with decreased edema and healing from XRT.  Recommend pt be able to consume water and ice chips freely after oral care to decrease disuse muscle atrophy.        SLP Visit Diagnosis Dysphagia, oropharyngeal phase (R13.12) Attention and concentration deficit following -- Frontal lobe and executive function deficit following -- Impact on safety and function --   CHL IP TREATMENT RECOMMENDATION 02/22/2019 Treatment Recommendations Therapy as outlined in treatment plan below   Prognosis 02/22/2019 Prognosis for Safe Diet Advancement Guarded Barriers to Reach Goals Severity of deficits Barriers/Prognosis Comment -- CHL IP DIET RECOMMENDATION 02/22/2019 SLP Diet Recommendations NPO;Free water protocol after oral care Liquid Administration via --  Medication Administration Via alternative means Compensations -- Postural Changes --   CHL IP OTHER RECOMMENDATIONS 02/22/2019 Recommended Consults -- Oral Care Recommendations Oral care  QID Other Recommendations Have oral suction available   CHL IP FOLLOW UP RECOMMENDATIONS 02/22/2019 Follow up Recommendations Home health SLP   CHL IP FREQUENCY AND DURATION 02/22/2019 Speech Therapy Frequency (ACUTE ONLY) min 2x/week Treatment Duration 2 weeks      CHL IP ORAL PHASE 02/22/2019 Oral Phase Impaired Oral - Pudding Teaspoon -- Oral - Pudding Cup -- Oral - Honey Teaspoon -- Oral - Honey Cup -- Oral - Nectar Teaspoon Delayed oral transit;Reduced posterior propulsion;Premature spillage;Decreased bolus cohesion;Weak lingual manipulation Oral - Nectar Cup Delayed oral transit;Reduced posterior propulsion;Premature spillage;Decreased bolus cohesion;Weak lingual manipulation Oral - Nectar Straw -- Oral - Thin Teaspoon Delayed oral transit;Reduced posterior propulsion;Premature spillage;Decreased bolus cohesion;Weak lingual manipulation Oral - Thin Cup Delayed oral transit;Reduced posterior propulsion;Premature spillage;Decreased bolus cohesion;Weak lingual manipulation Oral - Thin Straw Reduced posterior propulsion;Delayed oral transit;Premature spillage;Decreased bolus cohesion;Weak lingual manipulation Oral - Puree Delayed oral transit;Reduced posterior propulsion;Premature spillage;Decreased bolus cohesion;Weak lingual manipulation Oral - Mech Soft -- Oral - Regular -- Oral - Multi-Consistency -- Oral - Pill -- Oral Phase - Comment --  CHL IP PHARYNGEAL PHASE 02/22/2019 Pharyngeal Phase Impaired Pharyngeal- Pudding Teaspoon -- Pharyngeal -- Pharyngeal- Pudding Cup -- Pharyngeal -- Pharyngeal- Honey Teaspoon -- Pharyngeal -- Pharyngeal- Honey Cup -- Pharyngeal -- Pharyngeal- Nectar Teaspoon -- Pharyngeal -- Pharyngeal- Nectar Cup Reduced pharyngeal peristalsis;Reduced epiglottic inversion;Reduced anterior laryngeal mobility;Reduced laryngeal elevation;Reduced airway/laryngeal closure;Reduced tongue base retraction Pharyngeal Material enters airway, passes BELOW cords without attempt by patient to eject out  (silent aspiration) Pharyngeal- Nectar Straw -- Pharyngeal -- Pharyngeal- Thin Teaspoon Reduced pharyngeal peristalsis;Reduced epiglottic inversion;Reduced laryngeal elevation;Reduced airway/laryngeal closure;Reduced tongue base retraction;Pharyngeal residue - valleculae;Pharyngeal residue - pyriform;Penetration/Aspiration during swallow Pharyngeal Material enters airway, passes BELOW cords without attempt by patient to eject out (silent aspiration) Pharyngeal- Thin Cup Reduced pharyngeal peristalsis;Pharyngeal residue - valleculae;Reduced laryngeal elevation;Pharyngeal residue - pyriform;Reduced airway/laryngeal closure;Reduced tongue base retraction;Reduced epiglottic inversion;Penetration/Aspiration during swallow;Moderate aspiration Pharyngeal Material enters airway, passes BELOW cords without attempt by patient to eject out (silent aspiration) Pharyngeal- Thin Straw Reduced pharyngeal peristalsis;Pharyngeal residue - pyriform;Pharyngeal residue - valleculae;Moderate aspiration;Reduced epiglottic inversion;Reduced anterior laryngeal mobility;Reduced laryngeal elevation;Compensatory strategies attempted (with notebox);Reduced airway/laryngeal closure;Penetration/Apiration after swallow;Penetration/Aspiration during swallow Pharyngeal Material enters airway, passes BELOW cords without attempt by patient to eject out (silent aspiration) Pharyngeal- Puree Reduced epiglottic inversion;Reduced tongue base retraction;Reduced laryngeal elevation;Reduced anterior laryngeal mobility;Reduced pharyngeal peristalsis;Pharyngeal residue - valleculae Pharyngeal -- Pharyngeal- Mechanical Soft -- Pharyngeal -- Pharyngeal- Regular -- Pharyngeal -- Pharyngeal- Multi-consistency -- Pharyngeal -- Pharyngeal- Pill -- Pharyngeal -- Pharyngeal Comment chin tuck not helpful to prevent aspiration, neck extension  CHL IP CERVICAL ESOPHAGEAL PHASE 02/22/2019 Cervical Esophageal Phase WFL Pudding Teaspoon -- Pudding Cup -- Honey Teaspoon --  Honey Cup -- Nectar Teaspoon -- Nectar Cup -- Nectar Straw -- Thin Teaspoon -- Thin Cup -- Thin Straw -- Puree -- Mechanical Soft -- Regular -- Multi-consistency -- Pill -- Cervical Esophageal Comment -- Macario Golds 02/22/2019, 5:01 PM    Luanna Salk, MS [facility] SLP Acute Rehab Services Pager [phone] Office [phone]              LOS: 3 days   Time spent: More than 50% of that time was spent in counseling and/or coordination of care.  Antonieta Pert, MD [facility]  02/24/2019, 9:50 AM

## 2019-02-24 NOTE — Progress Notes (Signed)
CRITICAL VALUE ALERT  Critical Value:  Wbc 1.4  Date & Time Notied:  [id number]  Provider Notified: yes  Orders Received/Actions taken: no

## 2019-02-25 ENCOUNTER — Encounter (HOSPITAL_COMMUNITY): Payer: Self-pay | Admitting: Interventional Radiology

## 2019-02-25 ENCOUNTER — Inpatient Hospital Stay (HOSPITAL_COMMUNITY): Payer: Medicare HMO

## 2019-02-25 DIAGNOSIS — E782 Mixed hyperlipidemia: Secondary | ICD-10-CM

## 2019-02-25 DIAGNOSIS — R7881 Bacteremia: Secondary | ICD-10-CM

## 2019-02-25 DIAGNOSIS — A491 Streptococcal infection, unspecified site: Secondary | ICD-10-CM

## 2019-02-25 HISTORY — PX: IR REMOVAL TUN ACCESS W/ PORT W/O FL MOD SED: IMG2290

## 2019-02-25 LAB — CBC WITH DIFFERENTIAL/PLATELET
Abs Immature Granulocytes: 0.02 10*3/uL (ref 0.00–0.07)
Basophils Absolute: 0 10*3/uL (ref 0.0–0.1)
Basophils Relative: 0 %
Eosinophils Absolute: 0.1 10*3/uL (ref 0.0–0.5)
Eosinophils Relative: 1 %
HCT: 28.7 % — ABNORMAL LOW (ref 36.0–46.0)
Hemoglobin: 9.2 g/dL — ABNORMAL LOW (ref 12.0–15.0)
Immature Granulocytes: 1 %
Lymphocytes Relative: 3 %
Lymphs Abs: 0.1 10*3/uL — ABNORMAL LOW (ref 0.7–4.0)
MCH: 31.1 pg (ref 26.0–34.0)
MCHC: 32.1 g/dL (ref 30.0–36.0)
MCV: 97 fL (ref 80.0–100.0)
Monocytes Absolute: 0.1 10*3/uL (ref 0.1–1.0)
Monocytes Relative: 3 %
Neutro Abs: 3.9 10*3/uL (ref 1.7–7.7)
Neutrophils Relative %: 92 %
Platelets: 64 10*3/uL — ABNORMAL LOW (ref 150–400)
RBC: 2.96 MIL/uL — ABNORMAL LOW (ref 3.87–5.11)
RDW: 15.2 % (ref 11.5–15.5)
WBC: 4.3 10*3/uL (ref 4.0–10.5)
nRBC: 0 % (ref 0.0–0.2)

## 2019-02-25 LAB — BASIC METABOLIC PANEL
Anion gap: 9 (ref 5–15)
BUN: 6 mg/dL — ABNORMAL LOW (ref 8–23)
CO2: 35 mmol/L — ABNORMAL HIGH (ref 22–32)
Calcium: 8.4 mg/dL — ABNORMAL LOW (ref 8.9–10.3)
Chloride: 96 mmol/L — ABNORMAL LOW (ref 98–111)
Creatinine, Ser: 0.44 mg/dL (ref 0.44–1.00)
GFR calc Af Amer: >60 mL/min (ref >60)
GFR calc non Af Amer: >60 mL/min (ref >60)
Glucose, Bld: 135 mg/dL — ABNORMAL HIGH (ref 70–99)
Potassium: 4.3 mmol/L (ref 3.5–5.1)
Sodium: 140 mmol/L (ref 135–145)

## 2019-02-25 LAB — CBC
HCT: 28.5 % — ABNORMAL LOW (ref 36.0–46.0)
Hemoglobin: 9.2 g/dL — ABNORMAL LOW (ref 12.0–15.0)
MCH: 30.7 pg (ref 26.0–34.0)
MCHC: 32.3 g/dL (ref 30.0–36.0)
MCV: 95 fL (ref 80.0–100.0)
Platelets: 65 10*3/uL — ABNORMAL LOW (ref 150–400)
RBC: 3 MIL/uL — ABNORMAL LOW (ref 3.87–5.11)
RDW: 14.9 % (ref 11.5–15.5)
WBC: 4.4 10*3/uL (ref 4.0–10.5)
nRBC: 0 % (ref 0.0–0.2)

## 2019-02-25 MED ORDER — MIDAZOLAM HCL 2 MG/2ML IJ SOLN
INTRAMUSCULAR | Status: AC
  Filled 2019-02-25: qty 2

## 2019-02-25 MED ORDER — LIDOCAINE-EPINEPHRINE 1 %-1:100000 IJ SOLN
INTRAMUSCULAR | Status: AC
  Filled 2019-02-25: qty 1

## 2019-02-25 MED ORDER — LIDOCAINE-EPINEPHRINE (PF) 1 %-1:200000 IJ SOLN
INTRAMUSCULAR | Status: DC | PRN
  Administered 2019-02-25: 10 mL

## 2019-02-25 MED ORDER — FENTANYL CITRATE (PF) 100 MCG/2ML IJ SOLN
INTRAMUSCULAR | Status: AC
  Filled 2019-02-25: qty 2

## 2019-02-25 NOTE — Procedures (Signed)
Pre Procedural Dx: Poor venous access Post Procedural Dx: Same  Successful removal of anterior chest wall port-a-cath.  Port sent to lab for culture.   EBL: None No immediate post procedural complications.   Ronny Bacon, MD Pager #: [phone]

## 2019-02-25 NOTE — Progress Notes (Signed)
HEMATOLOGY-ONCOLOGY PROGRESS NOTE  SUBJECTIVE: Resting quietly.  Denies pain.  Denies fevers and chills.  Reports some shortness of breath.  Denies cough today.  Denies abdominal pain, nausea, vomiting.  Oncology History  Malignant neoplasm of supraglottis ([place])  10/26/2018 Imaging   CT neck w/ contrast: IMPRESSION: 1. Bulky, partially necrotic bilateral cervical lymphadenopathy most concerning for metastatic disease, less likely unusual infection. 2. Prominent soft tissue at the tongue base with asymmetric left aryepiglottic fold thickening and slight fullness of the left palatine tonsil. Direct visualization is recommended to assess for a primary mucosal malignancy. 3.  Aortic Atherosclerosis (ICD10-I70.0).   11/08/2018 Procedure   DL with biopsy   11/08/2018 Pathology Results   Accession: [id number] 7. Larynx, biopsy, Left Supraglottis - INVASIVE SQUAMOUS CELL CARCINOMA. 2. Larynx, biopsy, Left Ary Epiglottic Fold - INVASIVE SQUAMOUS CELL CARCINOMA. 3. Tongue, biopsy, Left Vallecular Base - INVASIVE SQUAMOUS CELL CARCINOMA. 4. Larynx, biopsy, Right Vallecular Epiglottis - INVASIVE SQUAMOUS CELL CARCINOMA.   11/16/2018 Initial Diagnosis   Laryngeal cancer ([place])   11/23/2018 Imaging   PET: IMPRESSION: 1. Intensely hypermetabolic supraglottic mass predominantly involving the LEFT aryepiglottic fold but extending across midline anteriorly. Inferiorly hypermetabolic activity extends to the level of the larynx on the LEFT. 2. Bilateral bulky intensely hypermetabolic metastatic level II lymph nodes. Metastatic adenopathy extends with smaller nodes in the level III nodal stations bilaterally. 3. No evidence of thoracic metastasis. 4. Enlarged LEFT adrenal gland consistent with hyperplasia or lipid poor adenoma.   11/27/2018 Cancer Staging   Staging form: Larynx - Supraglottis, AJCC 8th Edition - Clinical stage from 11/27/2018: Stage IVB (cT2, cN3b, cM0) - Signed by Eppie Gibson,  MD on 11/27/2018   12/13/2018 - 01/29/2019 Chemotherapy   The patient had palonosetron (ALOXI) injection 0.25 mg, 0.25 mg, Intravenous,  Once, 5 of 7 cycles Administration: 0.25 mg (12/13/2018), 0.25 mg (12/20/2018), 0.25 mg (12/27/2018), 0.25 mg (01/03/2019), 0.25 mg (01/10/2019) CARBOplatin (PARAPLATIN) 210 mg in sodium chloride 0.9 % 250 mL chemo infusion, 210 mg (100 % of original dose 213.8 mg), Intravenous,  Once, 5 of 7 cycles Dose modification:   (original dose 213.8 mg, Cycle 1) Administration: 210 mg (12/13/2018), 210 mg (12/20/2018), 210 mg (12/27/2018), 210 mg (01/03/2019), 210 mg (01/10/2019)  for chemotherapy treatment.       REVIEW OF SYSTEMS:   Constitutional: Denies fevers, chills Ears, nose, mouth, throat, and face: Reports a dry mouth and sore throat.  Denies mucositis. Respiratory: Reports shortness of breath.  Denies cough today. Cardiovascular: Denies palpitation, chest discomfort Gastrointestinal:  Denies nausea, heartburn or change in bowel habits Skin: Denies abnormal skin rashes Lymphatics: Reports ongoing swelling to her bilateral neck Neurological:Denies numbness, tingling or new weaknesses Behavioral/Psych: Mood is stable, no new changes  Extremities: No lower extremity edema All other systems were reviewed with the patient and are negative.  I have reviewed the past medical history, past surgical history, social history and family history with the patient and they are unchanged from previous note.   PHYSICAL EXAMINATION: ECOG PERFORMANCE STATUS: 3 - Symptomatic, >50% confined to bed  Vitals:   02/24/19 2130 02/25/19 0617  BP:  106/77  Pulse:  97  Resp:  19  Temp:  (!) 97.5 F (36.4 C)  SpO2: 100% 100%   Filed Weights   02/23/19 0422 02/23/19 1623 02/25/19 0617  Weight: 175 lb 11.3 oz (79.7 kg) 179 lb 14.3 oz (81.6 kg) 177 lb 7.5 oz (80.5 kg)    Intake/Output from previous day: 07/26 0701 -  07/27 0700 In: 600 [I.V.:600] Out: -   GENERAL: Chronically  ill-appearing female, resting quietly, no distress SKIN: skin color, texture, turgor are normal, no rashes or significant lesions EYES: normal, Conjunctiva are pink and non-injected, sclera clear OROPHARYNX:no exudate, no erythema and lips, buccal mucosa, and tongue normal  NECK: Palpable adenopathy, right greater than left LUNGS: clear to auscultation and percussion with normal breathing effort HEART: regular rate & rhythm and no murmurs and no lower extremity edema ABDOMEN:abdomen soft, non-tender and normal bowel sounds Musculoskeletal:no cyanosis of digits and no clubbing  NEURO: alert & oriented x 3 with fluent speech, no focal motor/sensory deficits  LABORATORY DATA:  I have reviewed the data as listed CMP Latest Ref Rng & Units 02/25/2019 02/24/2019 02/23/2019  Glucose 70 - 99 mg/dL 135(H) 148(H) 116(H)  BUN 8 - 23 mg/dL 6(L) 9 11  Creatinine 0.44 - 1.00 mg/dL 0.44 0.40(L) 0.61  Sodium 135 - 145 mmol/L 140 142 146(H)  Potassium 3.5 - 5.1 mmol/L 4.3 3.8 3.8  Chloride 98 - 111 mmol/L 96(L) 99 106  CO2 22 - 32 mmol/L 35(H) 35(H) 32  Calcium 8.9 - 10.3 mg/dL 8.4(L) 8.2(L) 8.2(L)  Total Protein 6.5 - 8.1 g/dL - - -  Total Bilirubin 0.3 - 1.2 mg/dL - - -  Alkaline Phos 38 - 126 U/L - - -  AST 15 - 41 U/L - - -  ALT 0 - 44 U/L - - -    Lab Results  Component Value Date   WBC 4.4 02/25/2019   HGB 9.2 (L) 02/25/2019   HCT 28.5 (L) 02/25/2019   MCV 95.0 02/25/2019   PLT 65 (L) 02/25/2019   NEUTROABS 2.5 01/31/2019    Dg Chest Port 1 View  Result Date: 02/22/2019 CLINICAL DATA:  Pneumonia EXAM: PORTABLE CHEST 1 VIEW COMPARISON:  02/28/2019 FINDINGS: Mild bibasilar airspace disease has developed since the prior study. Negative for heart failure or effusion. Heart size normal. Port-A-Cath tip at the cavoatrial junction unchanged. IMPRESSION: Interval development of mild bibasilar atelectasis/infiltrate. Possible pneumonia. Electronically Signed   By: Franchot Gallo M.D.   On:  02/22/2019 08:03   Dg Chest Port 1 View  Result Date: 02/26/2019 CLINICAL DATA:  Weakness.  Fall. EXAM: PORTABLE CHEST 1 VIEW COMPARISON:  PET-CT dated November 23, 2018. Chest x-ray dated Dec 10, 2008. FINDINGS: Right chest wall port catheter with tip at the cavoatrial junction. The heart size and mediastinal contours are within normal limits. Atherosclerotic calcification of the aortic arch. Normal pulmonary vascularity. No focal consolidation, pleural effusion, or pneumothorax. No acute osseous abnormality. Left internal carotid artery stent. IMPRESSION: No active disease. Electronically Signed   By: Titus Dubin M.D.   On: 02/28/2019 17:43   Dg Swallowing Func-speech Pathology  Result Date: 02/22/2019 Objective Swallowing Evaluation: Type of Study: MBS-Modified Barium Swallow Study  Patient Details Name: ELVA MAURO MRN: [id number] Date of Birth: 1950/08/29 Today's Date: 02/22/2019 Time: SLP Start Time (ACUTE ONLY): 1415 -SLP Stop Time (ACUTE ONLY): 1450 SLP Time Calculation (min) (ACUTE ONLY): 35 min Past Medical History: Past Medical History: Diagnosis Date . Allergy  . Carotid artery occlusion  . Heart murmur  . Hyperlipidemia  . Hypertension  . Hypothyroidism  . Laryngeal mass  . Osteoporosis  . Thyroid disease  . Wears glasses  . Wears upper complete and lower partial dentures  Past Surgical History: Past Surgical History: Procedure Laterality Date . ARCH AORTOGRAM N/A 07/23/2014  Procedure: ARCH AORTOGRAM;  Surgeon: Rosetta Posner, MD;  Location: [place] CATH LAB;  Service: Cardiovascular;  Laterality: N/A; . CAROTID ANGIOGRAM N/A 07/23/2014  Procedure: CAROTID ANGIOGRAM;  Surgeon: Rosetta Posner, MD;  Location: [facility] CATH LAB;  Service: Cardiovascular;  Laterality: N/A; . CAROTID ENDARTERECTOMY  12/11/2008  left . CAROTID STENT INSERTION Left 10/14/2014  Procedure: CAROTID STENT INSERTION;  Surgeon: Serafina Mitchell, MD;  Location: [facility] CATH LAB;  Service: Cardiovascular;  Laterality: Left;   carotid . CARPAL  TUNNEL RELEASE    X 2, right wrist . COLONOSCOPY  Jan. 2, 2015 . DILATION AND CURETTAGE OF UTERUS    X 2 . IR IMAGING GUIDED PORT INSERTION  12/06/2018 . laceration hand Right   hand . LARYNGOSCOPY N/A 11/08/2018  Procedure: DIRECT MICROLARYNGOSCOPY WITH BIOPSY;  Surgeon: Melida Quitter, MD;  Location: [place];  Service: ENT;  Laterality: N/A; . RIGID ESOPHAGOSCOPY N/A 11/08/2018  Procedure: ESOPHAGOSCOPY;  Surgeon: Melida Quitter, MD;  Location: [place];  Service: ENT;  Laterality: N/A; . THYROIDECTOMY   . TUBAL LIGATION  1975  bilateral HPI: 68 yo female with treatment for Laryngeal cancer, involving epiglottis currently undergoing chemo and radiation therapy, carotid artery occlusion, heart murmur, hypothyroidism, osteoporosis, hyperlipidemia.  Pt CXR 02/22/2019 showed Interval development of mild bibasilar atelectasis/infiltrate.  Pt had n/v prior to admission ? source of pna but with head and neck cancer, pharyngeal dysphagia may contribute.  Subjective: pt awake in bed Assessment / Plan / Recommendation CHL IP CLINICAL IMPRESSIONS 02/22/2019 Clinical Impression Patient presents with severe oropharyngeal dysphagia due to XRT, edema and decreased motility.  She is aspirating secretions and barium intake *largely silently* and unable to clear aspirates or with cued cough/expectoration.  Delayed oral transiting noted resulting in premature spillage of barium into pharynx.  Swallow is weak and pharyngeal trigger is delayed resulting in laryngeal penetration and aspiration and significant residuals. Various postures including chin tuck with and without head turn left *side with edema* and neck extension did not improve airway protection nor pharyngeal clearance.  At this time, swallow dysfunction does not allow pt to consume po efficiently or without aspiration.  Educated her to findings using teach back and live feed.   Will follow up for swallowing exercises and indication for repeat instrumental swallow evaluation.   Hopeful  for continued swallow function with decreased edema and healing from XRT.  Recommend pt be able to consume water and ice chips freely after oral care to decrease disuse muscle atrophy.        SLP Visit Diagnosis Dysphagia, oropharyngeal phase (R13.12) Attention and concentration deficit following -- Frontal lobe and executive function deficit following -- Impact on safety and function --   CHL IP TREATMENT RECOMMENDATION 02/22/2019 Treatment Recommendations Therapy as outlined in treatment plan below   Prognosis 02/22/2019 Prognosis for Safe Diet Advancement Guarded Barriers to Reach Goals Severity of deficits Barriers/Prognosis Comment -- CHL IP DIET RECOMMENDATION 02/22/2019 SLP Diet Recommendations NPO;Free water protocol after oral care Liquid Administration via -- Medication Administration Via alternative means Compensations -- Postural Changes --   CHL IP OTHER RECOMMENDATIONS 02/22/2019 Recommended Consults -- Oral Care Recommendations Oral care QID Other Recommendations Have oral suction available   CHL IP FOLLOW UP RECOMMENDATIONS 02/22/2019 Follow up Recommendations Home health SLP   CHL IP FREQUENCY AND DURATION 02/22/2019 Speech Therapy Frequency (ACUTE ONLY) min 2x/week Treatment Duration 2 weeks      CHL IP ORAL PHASE 02/22/2019 Oral Phase Impaired Oral - Pudding Teaspoon -- Oral - Pudding Cup -- Oral - Honey Teaspoon -- Oral -  Honey Cup -- Oral - Nectar Teaspoon Delayed oral transit;Reduced posterior propulsion;Premature spillage;Decreased bolus cohesion;Weak lingual manipulation Oral - Nectar Cup Delayed oral transit;Reduced posterior propulsion;Premature spillage;Decreased bolus cohesion;Weak lingual manipulation Oral - Nectar Straw -- Oral - Thin Teaspoon Delayed oral transit;Reduced posterior propulsion;Premature spillage;Decreased bolus cohesion;Weak lingual manipulation Oral - Thin Cup Delayed oral transit;Reduced posterior propulsion;Premature spillage;Decreased bolus cohesion;Weak lingual  manipulation Oral - Thin Straw Reduced posterior propulsion;Delayed oral transit;Premature spillage;Decreased bolus cohesion;Weak lingual manipulation Oral - Puree Delayed oral transit;Reduced posterior propulsion;Premature spillage;Decreased bolus cohesion;Weak lingual manipulation Oral - Mech Soft -- Oral - Regular -- Oral - Multi-Consistency -- Oral - Pill -- Oral Phase - Comment --  CHL IP PHARYNGEAL PHASE 02/22/2019 Pharyngeal Phase Impaired Pharyngeal- Pudding Teaspoon -- Pharyngeal -- Pharyngeal- Pudding Cup -- Pharyngeal -- Pharyngeal- Honey Teaspoon -- Pharyngeal -- Pharyngeal- Honey Cup -- Pharyngeal -- Pharyngeal- Nectar Teaspoon -- Pharyngeal -- Pharyngeal- Nectar Cup Reduced pharyngeal peristalsis;Reduced epiglottic inversion;Reduced anterior laryngeal mobility;Reduced laryngeal elevation;Reduced airway/laryngeal closure;Reduced tongue base retraction Pharyngeal Material enters airway, passes BELOW cords without attempt by patient to eject out (silent aspiration) Pharyngeal- Nectar Straw -- Pharyngeal -- Pharyngeal- Thin Teaspoon Reduced pharyngeal peristalsis;Reduced epiglottic inversion;Reduced laryngeal elevation;Reduced airway/laryngeal closure;Reduced tongue base retraction;Pharyngeal residue - valleculae;Pharyngeal residue - pyriform;Penetration/Aspiration during swallow Pharyngeal Material enters airway, passes BELOW cords without attempt by patient to eject out (silent aspiration) Pharyngeal- Thin Cup Reduced pharyngeal peristalsis;Pharyngeal residue - valleculae;Reduced laryngeal elevation;Pharyngeal residue - pyriform;Reduced airway/laryngeal closure;Reduced tongue base retraction;Reduced epiglottic inversion;Penetration/Aspiration during swallow;Moderate aspiration Pharyngeal Material enters airway, passes BELOW cords without attempt by patient to eject out (silent aspiration) Pharyngeal- Thin Straw Reduced pharyngeal peristalsis;Pharyngeal residue - pyriform;Pharyngeal residue -  valleculae;Moderate aspiration;Reduced epiglottic inversion;Reduced anterior laryngeal mobility;Reduced laryngeal elevation;Compensatory strategies attempted (with notebox);Reduced airway/laryngeal closure;Penetration/Apiration after swallow;Penetration/Aspiration during swallow Pharyngeal Material enters airway, passes BELOW cords without attempt by patient to eject out (silent aspiration) Pharyngeal- Puree Reduced epiglottic inversion;Reduced tongue base retraction;Reduced laryngeal elevation;Reduced anterior laryngeal mobility;Reduced pharyngeal peristalsis;Pharyngeal residue - valleculae Pharyngeal -- Pharyngeal- Mechanical Soft -- Pharyngeal -- Pharyngeal- Regular -- Pharyngeal -- Pharyngeal- Multi-consistency -- Pharyngeal -- Pharyngeal- Pill -- Pharyngeal -- Pharyngeal Comment chin tuck not helpful to prevent aspiration, neck extension  CHL IP CERVICAL ESOPHAGEAL PHASE 02/22/2019 Cervical Esophageal Phase WFL Pudding Teaspoon -- Pudding Cup -- Honey Teaspoon -- Honey Cup -- Nectar Teaspoon -- Nectar Cup -- Nectar Straw -- Thin Teaspoon -- Thin Cup -- Thin Straw -- Puree -- Mechanical Soft -- Regular -- Multi-consistency -- Pill -- Cervical Esophageal Comment -- Macario Golds 02/22/2019, 5:01 PM    Luanna Salk, MS [facility] SLP Acute Rehab Services Pager [phone] Office [phone]            ASSESSMENT AND PLAN: Streptococcus angionosis sepsis -S. Anginosis in two sets of blood cultures -On IV Penicillin -Case discussed with ID by hospitalist - recommend PAC removal and 2 weeks of antibiotics -PAC to be removed today  Suspected aspiration pneumonia -Continue IV antibiotics -On mucomyst nebs -SLP has evaluated the patient and barium swallow performed - noted to be aspirating secretions and barium intake, largely silently, and unable to clear aspirates with acute cough/expectoration -Currently n.p.o. 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 -S/pdefinitive chemoRT with weekly carboplatin x 5 dose; treatment complicated by prolonged cytopenias and poor tolerance  -Patient declined feeding tube placement due to lack of social support  -On exam, she still has somewhat firm bilateral  cervical adenopathy (R ~2-3cm, L ~1cm) -Therefore, will reevaluate the patient later this week and consider repeating a CT of the neck and chest prior to hospital discharge -She has PRN Zofran available to her  Pancytopenia -Likely due to recent sepsis -Received 1 dose of Granix on 02/24/2019 with improvement of her white blood cell count and ANC -Recommend daily CBC with differential -If ANC is above 1500 x 2 days, recommend stopping Granix -She has persistent mild anemia and thrombocytopenia -Transfuse packed red blood cells for hemoglobin less than 7.0 and transfuse platelets for platelet count less than 20,000 or active bleeding -No transfusion is indicated today  Hypomagnesemia -Secondary to electrolyte wasting from chemotherapy -Mg  2.1 this hospital admission -Continue to monitor   Hypokalemia -Secondary to electrolyte wasting from chemotherapy -Holding hydrochlorothiazide -Status post IV and oral supplementation -Potassium has improved to 4.3 today  Oral candidiasis -Has received a prolonged course of fluconazole -No recurrent oral candidiasis -Continue oral care  Protein malnutrition -Secondary to chemoradiation -Has declined a feeding tube in the past due to lack of social support and patient preference  -Currently n.p.o. -Recommend consideration of feeding tube placement this hospital admission    LOS: 4 days   Mikey Bussing, DNP, AGPCNP-BC, AOCNP 02/25/19

## 2019-02-25 NOTE — Progress Notes (Signed)
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  Patient fell at home, without loss of consciousness.  Currently on liquid diet and keeping up with oral intake apparently.  In the ER hypotensive hypothermic, admitted to ICU with septic shock suspected to be aspiration pneumonia her cancer is stage IVB(she is s/p 5 cycles of carboplatin & xrt>tolerated poorly) and declined feeding tube. 7/25 Patient was transferred to [facility] service  Subjective: Seen and examined. Still sleeping this morning nodded her head for answer.   No acute events overnight.   WBC improved to 4.3K this morning, afebrile.    Assessment & Plan:  Streptococcus anginosis sepsis with septic shock: Needed pressors while in ICU, currently hemodynamically stable.  I discussed with Dr. Linus Salmons from id 7/25-antibiotics switched to penicillin to narrow the spectrum, echo reviewed no significant acute finding, ID does not recommend TEE unless persistently bacteremic. Will need to take her port out-and-IR planning to do it today.  Give line free period, blood culture and PICC line. Will need antibiotics 2 weeks after port is out. Steroids (for septic shock) was discontinued in ICU. I discussed with Dr. Marin Olp from oncology who agrees with plan to remove Port-A-Cath given strep sepsis.  Neutropenia unclear etiology likely in the setting of sepsis.  Had chemoradiation therapy but completed a few weeks ago, appreciated oncology input, started post Granix and WBC improved at 4300.  Pancytopenia: level stable/low - monitor.   Recent Labs  Lab 02/22/19 0351 02/23/19 0829 02/24/19 0322  HGB 8.6* 8.5* 9.2*  HCT 25.7* 25.9* 28.1*  WBC 2.5*  1.7* 1.4*  PLT 69* 70* 67*   Suspected aspiration pneumonia: Continue antibiotics as above along with suctioning, Mucomyst nebs. Continue IV fluid hydration. cxr 7/24-"Interval development of mild bibasilar atelectasis/infiltrate.Possible pneumonia".  Antibiotics with penicillin as number 1.  Continue aspiration precaution.  Remains n.p.o. at [facility] speech evaluation.  Appreciate speech input on board.  Patient had refused tube feeding in the past in the setting of her cancer, cont on iv dextrose drip  Hypothyroidism: Continue Synthroid iv, and change to PO once able to take p.o.  HLD: Continue her statin once able to take p.o.  HTN: BP stable holding off meds  Malignant neoplasm of supraglottis: On radiation and chemo, had refused feeding tube in the past.  Completed chemoradiation few wks ago. Oncology on board.  remains full code, I will consult palliative care to discuss goals of care/code status. Remains npo and refused feeding tube previously.  Hypokalemia/hypernatremia: Improved.  cotn ivf  Recurrent vomiting currently denies any vomiting.  Continue supportive care  Carotid artery stenosis status post left CEA-on aspirin Plavix, hold as NPO for Port-A-Cath.  Continue rectal aspirin for now.  Goals of care: Patient appears deconditioned with complex comorbidities, cancer.  Continue PT OT.  Need to consider palliative care consultation Noted oncology recommendation that if patient intake remains he recommends feeding tube  DVT prophylaxis: SCD Code Status: full.  need palliative care consultation. Family Communication: plan of care discussed with patient in detail.  When asked if there is any family I can update patient declined the request.   Disposition Plan: Remains inpatient pending clinical improvement.  Consultants:  PCCM  Procedures:  TTE 7/24  1. The left ventricle has normal systolic function, with an ejection fraction of 55-60%. The cavity size was normal. There is  mild concentric left ventricular hypertrophy. Left ventricular diastolic Doppler parameters are consistent with impaired  relaxation. Elevated left ventricular end-diastolic pressure.  2. The right ventricle has normal systolic function. The cavity was normal. There is no increase in right ventricular wall thickness. Right ventricular systolic pressure is normal.  3. There is severe mitral annular calcification present.  4. The aortic valve is bicuspid. Severely thickening of the aortic valve. Severe calcifcation of the aortic valve. Moderate stenosis of the aortic valve.  5. The aorta is normal in size and structure.  SUMMARY   Aortic valve is possibly bicuspid with severely thickened and calcified leaflets with severely restricted leaflets opening and moderate aortic stenosis (peak/mean transaortic gradients 36/20 MmHg).  Microbiology: Streptococcus anginosis in blood culture  Antimicrobials: Anti-infectives (From admission, onward)   Start     Dose/Rate Route Frequency Ordered Stop   02/23/19 1300  penicillin G potassium 12 Million Units in dextrose 5 % 500 mL continuous infusion     12 Million Units 41.7 mL/hr over 12 Hours Intravenous 2 times daily 02/23/19 1205     02/22/19 0600  vancomycin (VANCOCIN) 1,500 mg in sodium chloride 0.9 % 500 mL IVPB  Status:  Discontinued     1,500 mg 250 mL/hr over 120 Minutes Intravenous Every 24 hours 02/21/19 0145 02/22/19 0916   02/21/19 0600  piperacillin-tazobactam (ZOSYN) IVPB 3.375 g  Status:  Discontinued     3.375 g 12.5 mL/hr over 240 Minutes Intravenous Every 8 hours 02/21/19 0215 02/23/19 1205   02/21/19 0600  ceFAZolin (ANCEF) IVPB 2g/100 mL premix  Status:  Discontinued     2 g 200 mL/hr over 30 Minutes Intravenous On call to O.R. 02/21/19 0218 02/21/19 1155   02/21/19 0130  vancomycin (VANCOCIN) 1,750 mg in sodium chloride 0.9 % 500 mL IVPB     1,750 mg 250 mL/hr over 120 Minutes Intravenous  Once 02/26/2019 2355 02/21/19 0620    02/21/19 0000  ceFEPIme (MAXIPIME) 2 g in sodium chloride 0.9 % 100 mL IVPB  Status:  Discontinued     2 g 200 mL/hr over 30 Minutes Intravenous Every 8 hours 02/24/2019 2349 02/21/19 0214       Objective: Vitals:   02/24/19 2103 02/24/19 2130 02/25/19 0617 02/25/19 1038  BP: 111/84  106/77   Pulse: (!) 101  97 (!) 103  Resp: 20  19 16   Temp: (!) 97.4 F (36.3 C)  (!) 97.5 F (36.4 C)   TempSrc:   Axillary   SpO2: 100% 100% 100% 96%  Weight:   80.5 kg   Height:        Intake/Output Summary (Last 24 hours) at 02/25/2019 1042 Last data filed at 02/25/2019 0600 Gross per 24 hour  Intake 600 ml  Output -  Net 600 ml   Filed Weights   02/23/19 0422 02/23/19 1623 02/25/19 0617  Weight: 79.7 kg 81.6 kg 80.5 kg   Weight change: -1.1 kg  Body mass index is 28.64 kg/m.  Intake/Output from previous day: 07/26 0701 - 07/27 0700 In: 600 [I.V.:600] Out: -  Intake/Output this shift: No intake/output data recorded.  Examination:  General exam: Weak frail, lethargic HEENT:Oral mucosa moist, Ear/Nose WNL grossly, dentition normal.  Scars present on the neck. Respiratory system: Diminished at the base, wheezing present mostly  Coming from the throat upper airway, NT,no use of accessory muscle  Cardiovascular system: S1 & S2 +, No JVD, regular RR. Gastrointestinal system: Abdomen soft, NT,ND, BS+ Nervous System:Alert, awake, moving extremities and grossly nonfocal Extremities: No edema, distal peripheral pulses palpable.  Skin: No rashes,no icterus. MSK: Normal muscle bulk,tone, power  Medications:  Scheduled Meds: . acetylcysteine  4 mL Nebulization BID  . aspirin  300 mg Rectal Daily  . chlorhexidine  15 mL Mouth Rinse BID  . Chlorhexidine Gluconate Cloth  6 each Topical Daily  . levothyroxine  56 mcg Intravenous Q0600  . mouth rinse  15 mL Mouth Rinse q12n4p  . potassium chloride  40 mEq Oral Once  . sodium chloride flush  3 mL Intravenous Q12H  . Tbo-Filgrastim  480 mcg  Subcutaneous q1800   Continuous Infusions: . dextrose 5 % and 0.45 % NaCl with KCl 40 mEq/L 50 mL/hr at 02/24/19 2237  . penicillin g continuous IV infusion 12 Million Units (02/25/19 1017)    Data Reviewed: I have personally reviewed following labs and imaging studies  CBC: Recent Labs  Lab 02/18/2019 1718  02/22/19 0351 02/23/19 0829 02/24/19 0322 02/25/19 0350 02/25/19 0941  WBC 2.8*   < > 2.5* 1.7* 1.4* 4.4 4.3  NEUTROABS 2.5  --   --   --   --   --  3.9  HGB 10.5*   < > 8.6* 8.5* 9.2* 9.2* 9.2*  HCT 30.3*   < > 25.7* 25.9* 28.1* 28.5* 28.7*  MCV 92.4   < > 95.9 96.6 95.9 95.0 97.0  PLT 79*   < > 69* 70* 67* 65* 64*   < > = values in this interval not displayed.   Basic Metabolic Panel: Recent Labs  Lab 02/19/2019 1802 02/21/19 0510  02/22/19 1330 02/22/19 2013 02/23/19 0418 02/24/19 0322 02/25/19 0350  NA  --  148*   < > 148* 147* 146* 142 140  K  --  3.4*   < > 3.7 3.6 3.8 3.8 4.3  CL  --  108   < > 109 109 106 99 96*  CO2  --  23   < > 33* 32 32 35* 35*  GLUCOSE  --  122*   < > 139* 116* 116* 148* 135*  BUN  --  30*   < > 14 12 11 9  6*  CREATININE  --  0.73   < > 0.67 0.71 0.61 0.40* 0.44  CALCIUM  --  8.6*   < > 8.2* 8.3* 8.2* 8.2* 8.4*  MG 2.1 2.1  --   --   --   --   --   --    < > = values in this interval not displayed.   GFR: Estimated Creatinine Clearance: 71 mL/min (by C-G formula based on SCr of 0.44 mg/dL). Liver Function Tests: Recent Labs  Lab 02/04/2019 1718 02/22/19 0351  AST 21 16  ALT 29 22  ALKPHOS 45 36*  BILITOT 1.5* 1.0  PROT 6.5 5.2*  ALBUMIN 3.8 3.0*   No results for input(s): LIPASE, AMYLASE in the last 168 hours. No results for input(s): AMMONIA in the last 168 hours. Coagulation Profile: No results for input(s): INR, PROTIME in the last 168 hours. Cardiac Enzymes: No results for input(s): CKTOTAL, CKMB, CKMBINDEX, TROPONINI in the last 168 hours. BNP (last 3 results) No results for input(s): PROBNP in the last 8760 hours.  HbA1C: No results for input(s): HGBA1C in the last 72 hours. CBG: No results for input(s): GLUCAP in the last 168 hours. Lipid  Profile: No results for input(s): CHOL, HDL, LDLCALC, TRIG, CHOLHDL, LDLDIRECT in the last 72 hours. Thyroid Function Tests: No results for input(s): TSH, T4TOTAL, FREET4, T3FREE, THYROIDAB in the last 72 hours. Anemia Panel: No results for input(s): VITAMINB12, FOLATE, FERRITIN, TIBC, IRON, RETICCTPCT in the last 72 hours. Sepsis Labs: Recent Labs  Lab 02/28/2019 1719 02/16/2019 1940  LATICACIDVEN 1.3 1.0    Recent Results (from the past 240 hour(s))  Blood culture (routine x 2)     Status: Abnormal   Collection Time: 02/28/2019  6:02 PM   Specimen: BLOOD  Result Value Ref Range Status   Specimen Description   Final    BLOOD RIGHT ANTECUBITAL Performed at [facility] [address] [place], [place] [address]    Special Requests   Final    BOTTLES DRAWN AEROBIC AND ANAEROBIC Blood Culture adequate volume Performed at [facility] [address], [place], [place] [address]    Culture  Setup Time   Final    ANAEROBIC BOTTLE ONLY GRAM POSITIVE COCCI CRITICAL VALUE NOTED.  VALUE IS CONSISTENT WITH PREVIOUSLY REPORTED AND CALLED VALUE.    Culture (A)  Final    STREPTOCOCCUS ANGINOSIS SUSCEPTIBILITIES PERFORMED ON PREVIOUS CULTURE WITHIN THE LAST 5 DAYS. Performed at [facility], [place] [address], [place], [place] [address]    Report Status 02/23/2019 FINAL  Final  Blood culture (routine x 2)     Status: Abnormal   Collection Time: 02/07/2019  6:02 PM   Specimen: BLOOD  Result Value Ref Range Status   Specimen Description   Final    BLOOD RIGHT CHEST Performed at [facility] [address], [place], [place] [address]    Special Requests   Final    BOTTLES DRAWN AEROBIC AND ANAEROBIC Blood Culture adequate volume Performed at [facility] [address]  [place], [place] [address]    Culture  Setup Time   Final    GRAM POSITIVE COCCI IN CHAINS IN BOTH AEROBIC AND ANAEROBIC BOTTLES CRITICAL RESULT CALLED TO, READ BACK BY AND VERIFIED WITHSeleta Rhymes [facility] 4503 02/21/19 A BROWNING Performed at [facility], [place] [address], [place], [place] [address]    Culture STREPTOCOCCUS ANGINOSIS (A)  Final   Report Status 02/23/2019 FINAL  Final   Organism ID, Bacteria STREPTOCOCCUS ANGINOSIS  Final      Susceptibility   Streptococcus anginosis - MIC*    PENICILLIN <=0.06 SENSITIVE Sensitive     CEFTRIAXONE 0.25 SENSITIVE Sensitive     ERYTHROMYCIN <=0.12 SENSITIVE Sensitive     LEVOFLOXACIN 0.5 SENSITIVE Sensitive     VANCOMYCIN 1 SENSITIVE Sensitive     * STREPTOCOCCUS ANGINOSIS  Blood Culture ID Panel (Reflexed)     Status: Abnormal   Collection Time: 02/15/2019  6:02 PM  Result Value Ref Range Status   Enterococcus species NOT DETECTED NOT DETECTED Final   Listeria monocytogenes NOT DETECTED NOT DETECTED Final   Staphylococcus species NOT DETECTED NOT DETECTED Final   Staphylococcus aureus (BCID) NOT DETECTED NOT DETECTED Final   Streptococcus species DETECTED (A) NOT DETECTED Final    Comment: Not Enterococcus species, Streptococcus agalactiae, Streptococcus pyogenes, or Streptococcus pneumoniae. CRITICAL RESULT CALLED TO, READ BACK BY AND VERIFIED WITH: Seleta Rhymes PHARMD 0034 02/21/19 A BROWNING    Streptococcus agalactiae NOT DETECTED NOT DETECTED Final   Streptococcus pneumoniae NOT DETECTED NOT DETECTED Final   Streptococcus pyogenes NOT DETECTED NOT DETECTED Final  Acinetobacter baumannii NOT DETECTED NOT DETECTED Final   Enterobacteriaceae species NOT DETECTED NOT DETECTED Final   Enterobacter cloacae complex NOT DETECTED NOT DETECTED Final   Escherichia coli NOT DETECTED NOT DETECTED Final   Klebsiella oxytoca NOT DETECTED NOT DETECTED Final   Klebsiella pneumoniae NOT DETECTED NOT DETECTED Final   Proteus species NOT DETECTED NOT  DETECTED Final   Serratia marcescens NOT DETECTED NOT DETECTED Final   Haemophilus influenzae NOT DETECTED NOT DETECTED Final   Neisseria meningitidis NOT DETECTED NOT DETECTED Final   Pseudomonas aeruginosa NOT DETECTED NOT DETECTED Final   Candida albicans NOT DETECTED NOT DETECTED Final   Candida glabrata NOT DETECTED NOT DETECTED Final   Candida krusei NOT DETECTED NOT DETECTED Final   Candida parapsilosis NOT DETECTED NOT DETECTED Final   Candida tropicalis NOT DETECTED NOT DETECTED Final    Comment: Performed at [facility], [place] [address] [place], [place] [address]  SARS Coronavirus 2 (CEPHEID - Performed in [place] hospital lab), Hosp Order     Status: None   Collection Time: 02/08/2019  8:59 PM   Specimen: Nasopharyngeal Swab  Result Value Ref Range Status   SARS Coronavirus 2 NEGATIVE NEGATIVE Final    Comment: (NOTE) If result is NEGATIVE SARS-CoV-2 target nucleic acids are NOT DETECTED. The SARS-CoV-2 RNA is generally detectable in upper and lower  respiratory specimens during the acute phase of infection. The lowest  concentration of SARS-CoV-2 viral copies this assay can detect is 250  copies / mL. A negative result does not preclude SARS-CoV-2 infection  and should not be used as the sole basis for treatment or other  patient management decisions.  A negative result may occur with  improper specimen collection / handling, submission of specimen other  than nasopharyngeal swab, presence of viral mutation(s) within the  areas targeted by this assay, and inadequate number of viral copies  (<250 copies / mL). A negative result must be combined with clinical  observations, patient history, and epidemiological information. If result is POSITIVE SARS-CoV-2 target nucleic acids are DETECTED. The SARS-CoV-2 RNA is generally detectable in upper and lower  respiratory specimens dur ing the acute phase of infection.  Positive  results are indicative of active infection  with SARS-CoV-2.  Clinical  correlation with patient history and other diagnostic information is  necessary to determine patient infection status.  Positive results do  not rule out bacterial infection or co-infection with other viruses. If result is PRESUMPTIVE POSTIVE SARS-CoV-2 nucleic acids MAY BE PRESENT.   A presumptive positive result was obtained on the submitted specimen  and confirmed on repeat testing.  While 2019 novel coronavirus  (SARS-CoV-2) nucleic acids may be present in the submitted sample  additional confirmatory testing may be necessary for epidemiological  and / or clinical management purposes  to differentiate between  SARS-CoV-2 and other Sarbecovirus currently known to infect humans.  If clinically indicated additional testing with an alternate test  methodology [phone]) is advised. The SARS-CoV-2 RNA is generally  detectable in upper and lower respiratory sp ecimens during the acute  phase of infection. The expected result is Negative. Fact Sheet for Patients:  StrictlyIdeas.no Fact Sheet for Healthcare Providers: BankingDealers.co.za This test is not yet approved or cleared by the [place] FDA and has been authorized for detection and/or diagnosis of SARS-CoV-2 by FDA under an Emergency Use Authorization (EUA).  This EUA will remain in effect (meaning this test can be used) for the duration of the COVID-19  declaration under Section 564(b)(1) of the Act, 21 U.S.C. section 360bbb-3(b)(1), unless the authorization is terminated or revoked sooner. Performed at [facility], [place] [address], [place], [place] [address]   MRSA PCR Screening     Status: None   Collection Time: 02/21/19  2:56 PM   Specimen: Nasal Mucosa; Nasopharyngeal  Result Value Ref Range Status   MRSA by PCR NEGATIVE NEGATIVE Final    Comment:        The GeneXpert MRSA Assay (FDA approved for NASAL specimens only), is  one component of a comprehensive MRSA colonization surveillance program. It is not intended to diagnose MRSA infection nor to guide or monitor treatment for MRSA infections. Performed at [facility], [place] [address], [place], [place] [address]       Radiology Studies: No results found.    LOS: 4 days   Time spent: More than 50% of that time was spent in counseling and/or coordination of care.  Antonieta Pert, MD [facility]  02/25/2019, 10:42 AM

## 2019-02-25 NOTE — Progress Notes (Signed)
Arrived @ bedside for routine line care. Port was removed in radiology today. Will D/c routine line care orders.

## 2019-02-25 NOTE — Progress Notes (Signed)
SLP Cancellation Note  Patient Details Name: Gloria Murray MRN: [id number] DOB: 1950/12/15   Cancelled treatment:       Reason Eval/Treat Not Completed: Fatigue/lethargy limiting ability to participate  Pt not adequately alert for po trials or education even with mild sternal rubbing.  She kept her eyes closed and was snoring.   Luanna Salk, MS [facility] SLP Acute Rehab Services Pager [phone] Office [phone]    Macario Golds 02/25/2019, 1:54 PM

## 2019-02-25 NOTE — Sedation Documentation (Signed)
No sedation given.

## 2019-02-25 NOTE — Care Management Important Message (Signed)
Important Message  Patient Details IM Letter given to Servando Snare SW to present to the Patient Name: Gloria Murray MRN: [id number] Date of Birth: 1950-10-13   Medicare Important Message Given:  Yes     Kerin Salen 02/25/2019, 10:42 AM

## 2019-02-26 ENCOUNTER — Encounter (HOSPITAL_COMMUNITY): Payer: Self-pay | Admitting: Radiology

## 2019-02-26 ENCOUNTER — Inpatient Hospital Stay (HOSPITAL_COMMUNITY): Payer: Medicare HMO

## 2019-02-26 ENCOUNTER — Inpatient Hospital Stay: Payer: Self-pay

## 2019-02-26 DIAGNOSIS — R531 Weakness: Secondary | ICD-10-CM

## 2019-02-26 DIAGNOSIS — Z515 Encounter for palliative care: Secondary | ICD-10-CM

## 2019-02-26 DIAGNOSIS — Z7189 Other specified counseling: Secondary | ICD-10-CM

## 2019-02-26 DIAGNOSIS — R7881 Bacteremia: Secondary | ICD-10-CM

## 2019-02-26 LAB — BASIC METABOLIC PANEL
Anion gap: 10 (ref 5–15)
BUN: 7 mg/dL — ABNORMAL LOW (ref 8–23)
CO2: 29 mmol/L (ref 22–32)
Calcium: 8.6 mg/dL — ABNORMAL LOW (ref 8.9–10.3)
Chloride: 99 mmol/L (ref 98–111)
Creatinine, Ser: 0.49 mg/dL (ref 0.44–1.00)
GFR calc Af Amer: >60 mL/min (ref >60)
GFR calc non Af Amer: >60 mL/min (ref >60)
Glucose, Bld: 113 mg/dL — ABNORMAL HIGH (ref 70–99)
Potassium: 5 mmol/L (ref 3.5–5.1)
Sodium: 138 mmol/L (ref 135–145)

## 2019-02-26 LAB — CBC
HCT: 30.4 % — ABNORMAL LOW (ref 36.0–46.0)
Hemoglobin: 9.6 g/dL — ABNORMAL LOW (ref 12.0–15.0)
MCH: 30.9 pg (ref 26.0–34.0)
MCHC: 31.6 g/dL (ref 30.0–36.0)
MCV: 97.7 fL (ref 80.0–100.0)
Platelets: 61 10*3/uL — ABNORMAL LOW (ref 150–400)
RBC: 3.11 MIL/uL — ABNORMAL LOW (ref 3.87–5.11)
RDW: 15 % (ref 11.5–15.5)
WBC: 6.1 10*3/uL (ref 4.0–10.5)
nRBC: 0 % (ref 0.0–0.2)

## 2019-02-26 LAB — DIFFERENTIAL
Basophils Absolute: 0 10*3/uL (ref 0.0–0.1)
Basophils Relative: 1 %
Eosinophils Absolute: 0.1 10*3/uL (ref 0.0–0.5)
Eosinophils Relative: 2 %
Lymphocytes Relative: 3 %
Lymphs Abs: 0.2 10*3/uL — ABNORMAL LOW (ref 0.7–4.0)
Monocytes Absolute: 0.2 10*3/uL (ref 0.1–1.0)
Monocytes Relative: 4 %
Neutro Abs: 5.4 10*3/uL (ref 1.7–7.7)
Neutrophils Relative %: 88 %

## 2019-02-26 MED ORDER — SODIUM CHLORIDE (PF) 0.9 % IJ SOLN
INTRAMUSCULAR | Status: AC
  Filled 2019-02-26: qty 50

## 2019-02-26 MED ORDER — SODIUM CHLORIDE 0.9% FLUSH
10.0000 mL | Freq: Two times a day (BID) | INTRAVENOUS | Status: DC
  Administered 2019-02-27: 10 mL

## 2019-02-26 MED ORDER — IOHEXOL 300 MG/ML  SOLN: 100.0000 mL | Freq: Once | INTRAMUSCULAR | Status: DC | PRN

## 2019-02-26 MED ORDER — SODIUM CHLORIDE 0.9% FLUSH: 10.0000 mL | INTRAVENOUS | Status: DC | PRN

## 2019-02-26 NOTE — Progress Notes (Addendum)
HEMATOLOGY-ONCOLOGY PROGRESS NOTE  SUBJECTIVE: Drowsy, but opens eyes and answers questions. Poor historian. Seems confused this morning. Denies chest pain and shortness of breath. No nausea or vomiting.   Oncology History  Malignant neoplasm of supraglottis ([place])  10/26/2018 Imaging   CT neck w/ contrast: IMPRESSION: 1. Bulky, partially necrotic bilateral cervical lymphadenopathy most concerning for metastatic disease, less likely unusual infection. 2. Prominent soft tissue at the tongue base with asymmetric left aryepiglottic fold thickening and slight fullness of the left palatine tonsil. Direct visualization is recommended to assess for a primary mucosal malignancy. 3.  Aortic Atherosclerosis (ICD10-I70.0).   11/08/2018 Procedure   DL with biopsy   11/08/2018 Pathology Results   Accession: [id number] 7. Larynx, biopsy, Left Supraglottis - INVASIVE SQUAMOUS CELL CARCINOMA. 2. Larynx, biopsy, Left Ary Epiglottic Fold - INVASIVE SQUAMOUS CELL CARCINOMA. 3. Tongue, biopsy, Left Vallecular Base - INVASIVE SQUAMOUS CELL CARCINOMA. 4. Larynx, biopsy, Right Vallecular Epiglottis - INVASIVE SQUAMOUS CELL CARCINOMA.   11/16/2018 Initial Diagnosis   Laryngeal cancer ([place])   11/23/2018 Imaging   PET: IMPRESSION: 1. Intensely hypermetabolic supraglottic mass predominantly involving the LEFT aryepiglottic fold but extending across midline anteriorly. Inferiorly hypermetabolic activity extends to the level of the larynx on the LEFT. 2. Bilateral bulky intensely hypermetabolic metastatic level II lymph nodes. Metastatic adenopathy extends with smaller nodes in the level III nodal stations bilaterally. 3. No evidence of thoracic metastasis. 4. Enlarged LEFT adrenal gland consistent with hyperplasia or lipid poor adenoma.   11/27/2018 Cancer Staging   Staging form: Larynx - Supraglottis, AJCC 8th Edition - Clinical stage from 11/27/2018: Stage IVB (cT2, cN3b, cM0) - Signed by Eppie Gibson, MD on 11/27/2018   12/13/2018 - 01/29/2019 Chemotherapy   The patient had palonosetron (ALOXI) injection 0.25 mg, 0.25 mg, Intravenous,  Once, 5 of 7 cycles Administration: 0.25 mg (12/13/2018), 0.25 mg (12/20/2018), 0.25 mg (12/27/2018), 0.25 mg (01/03/2019), 0.25 mg (01/10/2019) CARBOplatin (PARAPLATIN) 210 mg in sodium chloride 0.9 % 250 mL chemo infusion, 210 mg (100 % of original dose 213.8 mg), Intravenous,  Once, 5 of 7 cycles Dose modification:   (original dose 213.8 mg, Cycle 1) Administration: 210 mg (12/13/2018), 210 mg (12/20/2018), 210 mg (12/27/2018), 210 mg (01/03/2019), 210 mg (01/10/2019)  for chemotherapy treatment.       REVIEW OF SYSTEMS:   Constitutional: Denies fevers, chills Ears, nose, mouth, throat, and face: Reports a dry mouth and sore throat.  Denies mucositis. Respiratory:  Denies cough and shortness of breath. Cardiovascular: Denies palpitation, chest discomfort Gastrointestinal:  Denies nausea, heartburn or change in bowel habits Skin: Denies abnormal skin rashes Lymphatics: Reports ongoing swelling to her bilateral neck Neurological:Denies numbness, tingling or new weaknesses Behavioral/Psych: Mood is stable, no new changes  Extremities: No lower extremity edema All other systems were reviewed with the patient and are negative.  I have reviewed the past medical history, past surgical history, social history and family history with the patient and they are unchanged from previous note.   PHYSICAL EXAMINATION: ECOG PERFORMANCE STATUS: 3 - Symptomatic, >50% confined to bed  Vitals:   02/26/19 0409 02/26/19 0815  BP: 101/85   Pulse: 79 95  Resp: 16 14  Temp: 98.5 F (36.9 C)   SpO2: 95% 95%   Filed Weights   02/23/19 1623 02/25/19 0617 02/26/19 0500  Weight: 179 lb 14.3 oz (81.6 kg) 177 lb 7.5 oz (80.5 kg) 167 lb 15.9 oz (76.2 kg)    Intake/Output from previous day: No intake/output data recorded.  GENERAL:  Chronically ill-appearing female, resting  quietly, no distress SKIN: skin color, texture, turgor are normal, no rashes or significant lesions EYES: normal, Conjunctiva are pink and non-injected, sclera clear OROPHARYNX:no exudate, no erythema and lips, buccal mucosa, and tongue normal  NECK: Palpable adenopathy, right greater than left LUNGS: Coarse rhonchi in the anterior lung fields HEART: regular rate & rhythm and no murmurs and no lower extremity edema ABDOMEN:abdomen soft, non-tender and normal bowel sounds Musculoskeletal:no cyanosis of digits and no clubbing  NEURO: alert & oriented x 3 with fluent speech, no focal motor/sensory deficits  LABORATORY DATA:  I have reviewed the data as listed CMP Latest Ref Rng & Units 02/26/2019 02/25/2019 02/24/2019  Glucose 70 - 99 mg/dL 113(H) 135(H) 148(H)  BUN 8 - 23 mg/dL 7(L) 6(L) 9  Creatinine 0.44 - 1.00 mg/dL 0.49 0.44 0.40(L)  Sodium 135 - 145 mmol/L 138 140 142  Potassium 3.5 - 5.1 mmol/L 5.0 4.3 3.8  Chloride 98 - 111 mmol/L 99 96(L) 99  CO2 22 - 32 mmol/L 29 35(H) 35(H)  Calcium 8.9 - 10.3 mg/dL 8.6(L) 8.4(L) 8.2(L)  Total Protein 6.5 - 8.1 g/dL - - -  Total Bilirubin 0.3 - 1.2 mg/dL - - -  Alkaline Phos 38 - 126 U/L - - -  AST 15 - 41 U/L - - -  ALT 0 - 44 U/L - - -    Lab Results  Component Value Date   WBC 6.1 02/26/2019   HGB 9.6 (L) 02/26/2019   HCT 30.4 (L) 02/26/2019   MCV 97.7 02/26/2019   PLT 61 (L) 02/26/2019   NEUTROABS 5.4 02/26/2019    Ir Removal Tun Access W/ [place] W/o Fl Mod Sed  Result Date: 02/25/2019 CLINICAL DATA:  History of head neck cancer, now admitted with bacteremia and concern for superimposed port infection. Port a catheter was placed by Dr. Laurence Ferrari on 12/06/2018 and had previously functioned well. EXAM: REMOVAL OF IMPLANTED TUNNELED PORT-A-CATH MEDICATIONS: Patient is currently admitted to the hospital receiving intravenous antibiotics. ANESTHESIA/SEDATION: None FLUOROSCOPY TIME:  None PROCEDURE: Informed written consent was obtained  from the patient after a discussion of the risk, benefits and alternatives to the procedure. The patient was positioned supine on the fluoroscopy table and the right chest Port-A-Cath site was prepped with chlorhexidine. A sterile gown and gloves were worn during the procedure. Local anesthesia was provided with 1% lidocaine with epinephrine. A timeout was performed prior to the initiation of the procedure. An incision was made overlying the Port-A-Cath with a #15 scalpel. Utilizing sharp and blunt dissection, the Port-A-Cath was removed completely. Port a Catheter was set aside for potential culture. The pocked was irrigated with sterile saline. Wound closure was performed with subcutaneous 2-0 Vicryl, subcuticular 4-0 Vicryl, Dermabond and Steri-Strips. A dressing was placed. The patient tolerated the procedure well without immediate post procedural complication. FINDINGS: Successful removal of implant Port-A-Cath without immediate post procedural complication. IMPRESSION: Successful removal of implanted Port-A-Cath. Electronically Signed   By: Sandi Mariscal M.D.   On: 02/25/2019 14:48   Dg Chest Port 1 View  Result Date: 02/22/2019 CLINICAL DATA:  Pneumonia EXAM: PORTABLE CHEST 1 VIEW COMPARISON:  02/26/2019 FINDINGS: Mild bibasilar airspace disease has developed since the prior study. Negative for heart failure or effusion. Heart size normal. Port-A-Cath tip at the cavoatrial junction unchanged. IMPRESSION: Interval development of mild bibasilar atelectasis/infiltrate. Possible pneumonia. Electronically Signed   By: Franchot Gallo M.D.   On: 02/22/2019 08:03   Dg Chest [facility] [address]  Result Date: 02/27/2019 CLINICAL DATA:  Weakness.  Fall. EXAM: PORTABLE CHEST 1 VIEW COMPARISON:  PET-CT dated November 23, 2018. Chest x-ray dated Dec 10, 2008. FINDINGS: Right chest wall port catheter with tip at the cavoatrial junction. The heart size and mediastinal contours are within normal limits. Atherosclerotic  calcification of the aortic arch. Normal pulmonary vascularity. No focal consolidation, pleural effusion, or pneumothorax. No acute osseous abnormality. Left internal carotid artery stent. IMPRESSION: No active disease. Electronically Signed   By: Titus Dubin M.D.   On: 02/02/2019 17:43   Dg Swallowing Func-speech Pathology  Result Date: 02/22/2019 Objective Swallowing Evaluation: Type of Study: MBS-Modified Barium Swallow Study  Patient Details Name: JACKQUELYN SUNDBERG MRN: [id number] Date of Birth: [birth year] Today's Date: 02/22/2019 Time: SLP Start Time (ACUTE ONLY): 1415 -SLP Stop Time (ACUTE ONLY): 1450 SLP Time Calculation (min) (ACUTE ONLY): 35 min Past Medical History: Past Medical History: Diagnosis Date . Allergy  . Carotid artery occlusion  . Heart murmur  . Hyperlipidemia  . Hypertension  . Hypothyroidism  . Laryngeal mass  . Osteoporosis  . Thyroid disease  . Wears glasses  . Wears upper complete and lower partial dentures  Past Surgical History: Past Surgical History: Procedure Laterality Date . ARCH AORTOGRAM N/A 07/23/2014  Procedure: ARCH AORTOGRAM;  Surgeon: Rosetta Posner, MD;  Location: [facility] CATH LAB;  Service: Cardiovascular;  Laterality: N/A; . CAROTID ANGIOGRAM N/A 07/23/2014  Procedure: CAROTID ANGIOGRAM;  Surgeon: Rosetta Posner, MD;  Location: [facility] CATH LAB;  Service: Cardiovascular;  Laterality: N/A; . CAROTID ENDARTERECTOMY  12/11/2008  left . CAROTID STENT INSERTION Left 10/14/2014  Procedure: CAROTID STENT INSERTION;  Surgeon: Serafina Mitchell, MD;  Location: [facility] CATH LAB;  Service: Cardiovascular;  Laterality: Left;   carotid . CARPAL TUNNEL RELEASE    X 2, right wrist . COLONOSCOPY  Jan. 2, 2015 . DILATION AND CURETTAGE OF UTERUS    X 2 . IR IMAGING GUIDED PORT INSERTION  12/06/2018 . laceration hand Right   hand . LARYNGOSCOPY N/A 11/08/2018  Procedure: DIRECT MICROLARYNGOSCOPY WITH BIOPSY;  Surgeon: Melida Quitter, MD;  Location: [place];  Service: ENT;  Laterality: N/A; . RIGID ESOPHAGOSCOPY N/A  11/08/2018  Procedure: ESOPHAGOSCOPY;  Surgeon: Melida Quitter, MD;  Location: [place];  Service: ENT;  Laterality: N/A; . THYROIDECTOMY   . TUBAL LIGATION  1975  bilateral HPI: 68 yo female with treatment for Laryngeal cancer, involving epiglottis currently undergoing chemo and radiation therapy, carotid artery occlusion, heart murmur, hypothyroidism, osteoporosis, hyperlipidemia.  Pt CXR 02/22/2019 showed Interval development of mild bibasilar atelectasis/infiltrate.  Pt had n/v prior to admission ? source of pna but with head and neck cancer, pharyngeal dysphagia may contribute.  Subjective: pt awake in bed Assessment / Plan / Recommendation CHL IP CLINICAL IMPRESSIONS 02/22/2019 Clinical Impression Patient presents with severe oropharyngeal dysphagia due to XRT, edema and decreased motility.  She is aspirating secretions and barium intake *largely silently* and unable to clear aspirates or with cued cough/expectoration.  Delayed oral transiting noted resulting in premature spillage of barium into pharynx.  Swallow is weak and pharyngeal trigger is delayed resulting in laryngeal penetration and aspiration and significant residuals. Various postures including chin tuck with and without head turn left *side with edema* and neck extension did not improve airway protection nor pharyngeal clearance.  At this time, swallow dysfunction does not allow pt to consume po efficiently or without aspiration.  Educated her to findings using teach back and live feed.   Will follow  up for swallowing exercises and indication for repeat instrumental swallow evaluation.   Hopeful for continued swallow function with decreased edema and healing from XRT.  Recommend pt be able to consume water and ice chips freely after oral care to decrease disuse muscle atrophy.        SLP Visit Diagnosis Dysphagia, oropharyngeal phase (R13.12) Attention and concentration deficit following -- Frontal lobe and executive function deficit following -- Impact  on safety and function --   CHL IP TREATMENT RECOMMENDATION 02/22/2019 Treatment Recommendations Therapy as outlined in treatment plan below   Prognosis 02/22/2019 Prognosis for Safe Diet Advancement Guarded Barriers to Reach Goals Severity of deficits Barriers/Prognosis Comment -- CHL IP DIET RECOMMENDATION 02/22/2019 SLP Diet Recommendations NPO;Free water protocol after oral care Liquid Administration via -- Medication Administration Via alternative means Compensations -- Postural Changes --   CHL IP OTHER RECOMMENDATIONS 02/22/2019 Recommended Consults -- Oral Care Recommendations Oral care QID Other Recommendations Have oral suction available   CHL IP FOLLOW UP RECOMMENDATIONS 02/22/2019 Follow up Recommendations Home health SLP   CHL IP FREQUENCY AND DURATION 02/22/2019 Speech Therapy Frequency (ACUTE ONLY) min 2x/week Treatment Duration 2 weeks      CHL IP ORAL PHASE 02/22/2019 Oral Phase Impaired Oral - Pudding Teaspoon -- Oral - Pudding Cup -- Oral - Honey Teaspoon -- Oral - Honey Cup -- Oral - Nectar Teaspoon Delayed oral transit;Reduced posterior propulsion;Premature spillage;Decreased bolus cohesion;Weak lingual manipulation Oral - Nectar Cup Delayed oral transit;Reduced posterior propulsion;Premature spillage;Decreased bolus cohesion;Weak lingual manipulation Oral - Nectar Straw -- Oral - Thin Teaspoon Delayed oral transit;Reduced posterior propulsion;Premature spillage;Decreased bolus cohesion;Weak lingual manipulation Oral - Thin Cup Delayed oral transit;Reduced posterior propulsion;Premature spillage;Decreased bolus cohesion;Weak lingual manipulation Oral - Thin Straw Reduced posterior propulsion;Delayed oral transit;Premature spillage;Decreased bolus cohesion;Weak lingual manipulation Oral - Puree Delayed oral transit;Reduced posterior propulsion;Premature spillage;Decreased bolus cohesion;Weak lingual manipulation Oral - Mech Soft -- Oral - Regular -- Oral - Multi-Consistency -- Oral - Pill -- Oral Phase  - Comment --  CHL IP PHARYNGEAL PHASE 02/22/2019 Pharyngeal Phase Impaired Pharyngeal- Pudding Teaspoon -- Pharyngeal -- Pharyngeal- Pudding Cup -- Pharyngeal -- Pharyngeal- Honey Teaspoon -- Pharyngeal -- Pharyngeal- Honey Cup -- Pharyngeal -- Pharyngeal- Nectar Teaspoon -- Pharyngeal -- Pharyngeal- Nectar Cup Reduced pharyngeal peristalsis;Reduced epiglottic inversion;Reduced anterior laryngeal mobility;Reduced laryngeal elevation;Reduced airway/laryngeal closure;Reduced tongue base retraction Pharyngeal Material enters airway, passes BELOW cords without attempt by patient to eject out (silent aspiration) Pharyngeal- Nectar Straw -- Pharyngeal -- Pharyngeal- Thin Teaspoon Reduced pharyngeal peristalsis;Reduced epiglottic inversion;Reduced laryngeal elevation;Reduced airway/laryngeal closure;Reduced tongue base retraction;Pharyngeal residue - valleculae;Pharyngeal residue - pyriform;Penetration/Aspiration during swallow Pharyngeal Material enters airway, passes BELOW cords without attempt by patient to eject out (silent aspiration) Pharyngeal- Thin Cup Reduced pharyngeal peristalsis;Pharyngeal residue - valleculae;Reduced laryngeal elevation;Pharyngeal residue - pyriform;Reduced airway/laryngeal closure;Reduced tongue base retraction;Reduced epiglottic inversion;Penetration/Aspiration during swallow;Moderate aspiration Pharyngeal Material enters airway, passes BELOW cords without attempt by patient to eject out (silent aspiration) Pharyngeal- Thin Straw Reduced pharyngeal peristalsis;Pharyngeal residue - pyriform;Pharyngeal residue - valleculae;Moderate aspiration;Reduced epiglottic inversion;Reduced anterior laryngeal mobility;Reduced laryngeal elevation;Compensatory strategies attempted (with notebox);Reduced airway/laryngeal closure;Penetration/Apiration after swallow;Penetration/Aspiration during swallow Pharyngeal Material enters airway, passes BELOW cords without attempt by patient to eject out (silent  aspiration) Pharyngeal- Puree Reduced epiglottic inversion;Reduced tongue base retraction;Reduced laryngeal elevation;Reduced anterior laryngeal mobility;Reduced pharyngeal peristalsis;Pharyngeal residue - valleculae Pharyngeal -- Pharyngeal- Mechanical Soft -- Pharyngeal -- Pharyngeal- Regular -- Pharyngeal -- Pharyngeal- Multi-consistency -- Pharyngeal -- Pharyngeal- Pill -- Pharyngeal -- Pharyngeal Comment chin tuck not helpful to prevent aspiration, neck extension  CHL IP CERVICAL ESOPHAGEAL PHASE 02/22/2019 Cervical Esophageal Phase WFL Pudding Teaspoon -- Pudding Cup -- Honey Teaspoon -- Honey Cup -- Nectar Teaspoon -- Nectar Cup -- Nectar Straw -- Thin Teaspoon -- Thin Cup -- Thin Straw -- Puree -- Mechanical Soft -- Regular -- Multi-consistency -- Pill -- Cervical Esophageal Comment -- Macario Golds 02/22/2019, 5:01 PM    Luanna Salk, MS [facility] SLP Acute Rehab Services Pager [phone] Office [phone]            ASSESSMENT AND PLAN: Streptococcus angionosis sepsis -S. Anginosis in two sets of blood cultures -On IV Penicillin -Case discussed with ID by hospitalist - recommend PAC removal and 2 weeks of antibiotics -PAC removed on 02/25/2019 -PAC tip culture negative to date  Suspected aspiration pneumonia -Continue IV antibiotics -On mucomyst nebs -SLP has evaluated the patient and barium swallow performed - noted to be aspirating secretions and barium intake, largely silently, and unable to clear aspirates with acute cough/expectoration -Regular diet this morning - patient unable to eat this -Recommend SLP re-evaluate patient for appropriateness of diet -May need to consider feeding tube placement for nutrition (she has declined this in the past)  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 -S/pdefinitive chemoRT with weekly carboplatin x 5 dose; treatment complicated by prolonged cytopenias and poor tolerance  -Patient declined feeding tube placement  due to lack of social support  -On exam, she still has somewhat firm bilateral cervical adenopathy (R ~2-3cm, L ~1cm) -Will reevaluate with a CT of the neck and chest to assess response to treatment -Will also obtain CT of brain to evaluate for brain mets given her somnolence and confusion -She has PRN Zofran available to her  Pancytopenia -Likely due to recent sepsis -Received Granix X 2 doses with improvement of her white blood cell count and ANC -Recommend daily CBC with differential -Discontinue Granix -She has persistent mild anemia and thrombocytopenia - stable -Transfuse packed red blood cells for hemoglobin less than 7.0 and transfuse platelets for platelet count less than 20,000 or active bleeding -No transfusion is indicated today  Hypomagnesemia -Secondary to electrolyte wasting from chemotherapy -Mg  2.1 this hospital admission -Continue to monitor   Hypokalemia -Secondary to electrolyte wasting from chemotherapy -Holding hydrochlorothiazide -Status post IV and oral supplementation -Potassium has improved to 4.3 today  Oral candidiasis -Has received a prolonged course of fluconazole -No recurrent oral candidiasis -Continue oral care  Protein malnutrition -Secondary to chemoradiation -Has declined a feeding tube in the past due to lack of social support and patient preference  -Receiving regular diet, but patient at high risk for aspiration  -Recommend SLP to evaluate for appropriateness of diet -Recommend consideration of feeding tube placement this hospital admission    LOS: 5 days   Mikey Bussing, DNP, AGPCNP-BC, AOCNP 02/26/19

## 2019-02-26 NOTE — Consult Note (Signed)
Consultation Note Date: 02/26/2019   Patient Name: Gloria Murray  DOB: [birth year]  MRN: [id number]  Age / Sex: 68 y.o., female   PCP: Jearld Fenton, NP Referring Physician: Hosie Poisson, MD   REASON FOR CONSULTATION:Establishing goals of care  Palliative Care consult requested for this 68 y.o. 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 She presented to ED from home after falling. Patient reported on arrival that she was sitting down and suddenly fell to the floor. Her friend, [name] reports she went to check on patient and found her in the floor. UA and COVID negative. Chest x-ray showed right chest wall Port-A-Cath in place without focal consolidation, effusion, or edema. Lab were WBC 2.8, hemoglobin 10.5, platelets 79,000, sodium 146, potassium 2.9, magnesium 2.1, bicarb 31, BUN 44, creatinine 0.85, lactic acid 1.3. Since admission patient continues to have poor po intake. Port-A-Cath was removed due to streptococcus anginosis sepsis. She continues on antibiotics due to suspected aspiration pneumonia. Being followed by Oncology.   Clinical Assessment and Goals of Care: I have reviewed medical records including lab results, imaging, Epic notes, and MAR, received report from the bedside RN, and assessed the patient.I spoke with patient's friend, [name] via phone to discuss diagnosis prognosis, [place], EOL wishes, disposition and options. Patient is somewhat lethargic and confused. She was able to mumble her first name as Gloria Murray. Continued incomprehensible sounds when asking last name, dob, and other identifying questions. Staring at time with lip movement but no sound. She is not able to appropriately engage in discussions or in decision making.   I introduced Palliative Medicine as specialized  medical care for people living with serious illness. It focuses on providing relief from the symptoms and stress of a serious illness. The goal is to improve quality of life for both the patient and the family.  [name] reports she is patient's long-term friend for more than 50 years. She also is involved in her care and reports she often goes to appointments with patients and contacts her medical team when needed. Patient has 2 living relatives (an uncle and her cousin, [name]). Unfortunately, her uncle is in a facility and demented. [name] reports she has spoken to patient's cousin recently and provided updates on patient as she was concerned about her well-being. Per [name] cousin stated she did not want to have anything to do with patient's medical care or decisions. [name] is tearful during conversation, sharing she has attempted on several occassions to get patient to complete an advanced directive with fear this day would come. [name] states she is not comfortable making decisions for patient without appropriate paperwork. I shared with [name], I will attempt to contact [name] (cousin) to discuss patient's condition and needs. In the event cousin does not agree or declines to be involved and is willing to relinquish medical decision making abilities to her would she be willing to stand in proxy as patient's POA. [name] agreed that she would at that point. [name] is a Animal nutritionist here at [name] and the Surgical center in the [place]. I attempted to contact cousin with no answer. Voicemail with contact information was left.   We discussed a brief life review of the patient, along with functional and nutritional status.  [name] reports patient lives alone with her dog [name]. She has no family and relies on [name]  for support and friendship. She was married but her husband suffered a seizure while driving many years ago and was killed along with their daughter.    Prior to admission patient was able to  independently care for self most of the time. [name] would assist with errands, housework, and ADLs if patient was weak/fatigued. She reports patient's appetite and po intake has drastically decreased over the past months. [name] is unsure of the exact amount of weight loss. She states patient was on a liquid diet mostly and often would say she was taking in an appropriate amount of nutrition, although there were still concerns.   We discussed Her current illness and what it means in the larger context of Her on-going co-morbidities. With specific discussions regarding fall, cancer, and overall nutritional and functional decline.  Natural disease trajectory and expectations at EOL were discussed.  [name] is tearful in discussion stating "I have worried about her for some time, but not as much as I have over the past few weeks!" She states she has noticed some changes in her mental state with periods of what seemed to be confusion. She shares moments of patient asking to go to the bank to pay rent, and she had already done so, not remembering pin number at atm, or calling and asking her if she was coming over to see her when she had just left from her house. [name] shares she recently discovered patient had not been paying her bills which is not like her. Some of her utilities and phone is disconnected due to past due accounts. She states her rent is paid and that seems to be the one bill she was focused on as the landlord informed her patient would attempt to pay rent on multiple occassions even after it had been paid. Landlord also has been helping with errands and companionship.   I attempted to elicit values and goals of care important to the patient.    Although no decisions can be made at this time, without contacting and speaking with next-of kin. [name] shares she is concerned about patient and she knows for sure patient will not be able to come back home alone in her condition. She verbalizes  understanding of her condition based on previous updates from her medical team. [name] states patient has declined PEG tube on multiple occasions in the past despite knowing it would assist in providing additional nutrition.   Patient does not have a documented advanced directive per friend, [name].   Questions and concerns were addressed. The family was encouraged to call with questions or concerns.  PMT will continue to support holistically.   SOCIAL HISTORY:     reports that she quit smoking about 4 years ago. Her smoking use included cigarettes. She smoked 0.50 packs per day. She has never used smokeless tobacco. She reports that she does not drink alcohol or use drugs.  CODE STATUS: Full code  ADVANCE DIRECTIVES: Next of Kin ?Evaristo Bury   SYMPTOM MANAGEMENT: per attending   Palliative Prophylaxis:   Aspiration, Bowel Regimen, Delirium Protocol, Frequent Pain Assessment, Oral Care and Turn Reposition  PSYCHO-SOCIAL/SPIRITUAL:  Support System: Family  Desire for further Chaplaincy support:NO   Additional Recommendations (Limitations, Scope, Preferences):  Full Scope Treatment   PAST MEDICAL HISTORY: Past Medical History:  Diagnosis Date  . Allergy   . Carotid artery occlusion   . Heart murmur   . Hyperlipidemia   . Hypertension   . Hypothyroidism   . Laryngeal mass   .  Osteoporosis   . Thyroid disease   . Wears glasses   . Wears upper complete and lower partial dentures     PAST SURGICAL HISTORY:  Past Surgical History:  Procedure Laterality Date  . ARCH AORTOGRAM N/A 07/23/2014   Procedure: ARCH AORTOGRAM;  Surgeon: Rosetta Posner, MD;  Location: [facility] CATH LAB;  Service: Cardiovascular;  Laterality: N/A;  . CAROTID ANGIOGRAM N/A 07/23/2014   Procedure: CAROTID ANGIOGRAM;  Surgeon: Rosetta Posner, MD;  Location: [facility] CATH LAB;  Service: Cardiovascular;  Laterality: N/A;  . CAROTID ENDARTERECTOMY  12/11/2008   left  . CAROTID STENT INSERTION Left 10/14/2014    Procedure: CAROTID STENT INSERTION;  Surgeon: Serafina Mitchell, MD;  Location: [facility] CATH LAB;  Service: Cardiovascular;  Laterality: Left;   carotid  . CARPAL TUNNEL RELEASE     X 2, right wrist  . COLONOSCOPY  Jan. 2, 2015  . DILATION AND CURETTAGE OF UTERUS     X 2  . IR IMAGING GUIDED PORT INSERTION  12/06/2018  . IR REMOVAL TUN ACCESS W/ PORT W/O FL MOD SED  02/25/2019  . laceration hand Right    hand  . LARYNGOSCOPY N/A 11/08/2018   Procedure: DIRECT MICROLARYNGOSCOPY WITH BIOPSY;  Surgeon: Melida Quitter, MD;  Location: [facility];  Service: ENT;  Laterality: N/A;  . RIGID ESOPHAGOSCOPY N/A 11/08/2018   Procedure: ESOPHAGOSCOPY;  Surgeon: Melida Quitter, MD;  Location: [facility];  Service: ENT;  Laterality: N/A;  . THYROIDECTOMY    . TUBAL LIGATION  1975   bilateral    ALLERGIES:  is allergic to propoxyphene hcl and propoxyphene n-acetaminophen.   MEDICATIONS:  Current Facility-Administered Medications  Medication Dose Route Frequency Provider Last Rate Last Dose  . acetaminophen (TYLENOL) tablet 650 mg  650 mg Oral Q6H PRN Erick Colace, NP       Or  . acetaminophen (TYLENOL) suppository 650 mg  650 mg Rectal Q6H PRN Erick Colace, NP      . acetylcysteine (MUCOMYST) 20 % nebulizer / oral solution 4 mL  4 mL Nebulization BID Kc, Ramesh, MD   4 mL at 02/26/19 0815  . albuterol (PROVENTIL) (2.5 MG/3ML) 0.083% nebulizer solution 2.5 mg  2.5 mg Nebulization Q4H PRN Kc, Ramesh, MD   2.5 mg at 02/26/19 0815  . aspirin suppository 300 mg  300 mg Rectal Daily Kc, Ramesh, MD   300 mg at 02/26/19 1034  . chlorhexidine (PERIDEX) 0.12 % solution 15 mL  15 mL Mouth Rinse BID Erick Colace, NP   15 mL at 02/26/19 1033  . Chlorhexidine Gluconate Cloth 2 % PADS 6 each  6 each Topical Daily Erick Colace, NP   6 each at 02/26/19 1034  . dextrose 5 % and 0.45 % NaCl with KCl 40 mEq/L infusion   Intravenous Continuous Erick Colace, NP 50 mL/hr at 02/25/19 1940 50 mL/hr at 02/25/19 1940  . iohexol  (OMNIPAQUE) 300 MG/ML solution 100 mL  100 mL Intravenous Once PRN Curcio, Roselie Awkward, NP      . levothyroxine (SYNTHROID, LEVOTHROID) injection 56 mcg  56 mcg Intravenous Q0600 Erick Colace, NP   56 mcg at 02/26/19 0540  . lidocaine (XYLOCAINE) 2 % viscous mouth solution 15 mL  15 mL Mouth/Throat Q6H PRN Erick Colace, NP      . lidocaine-EPINEPHrine (XYLOCAINE-EPINEPHrine) 1 %-1:200000 (PF) injection    PRN Sandi Mariscal, MD   10 mL at 02/25/19 1254  . MEDLINE mouth rinse  15 mL Mouth Rinse q12n4p Erick Colace, NP   15 mL at 02/25/19 1600  . ondansetron (ZOFRAN) tablet 4 mg  4 mg Oral Q6H PRN Erick Colace, NP       Or  . ondansetron [facility]) injection 4 mg  4 mg Intravenous Q6H PRN Erick Colace, NP      . penicillin G potassium 12 Million Units in dextrose 5 % 500 mL continuous infusion  12 Million Units Intravenous BID Polly Cobia, RPH 41.7 mL/hr at 02/25/19 2336 12 Million Units at 02/25/19 2336  . potassium chloride 20 MEQ/15ML (10%) solution 40 mEq  40 mEq Oral Once Erick Colace, NP   Stopped at 02/13/2019 2200  . sodium chloride (PF) 0.9 % injection           . sodium chloride flush (NS) 0.9 % injection 3 mL  3 mL Intravenous Q12H Erick Colace, NP   3 mL at 02/24/19 2200    VITAL SIGNS: BP 101/85 (BP Location: Left Arm)   Pulse 95   Temp 98.5 F (36.9 C) (Axillary)   Resp 14   Ht _0  (1.676 m)   Wt 76.2 kg   SpO2 95%   BMI 27.11 kg/m  Filed Weights   02/23/19 1623 02/25/19 0617 02/26/19 0500  Weight: 81.6 kg 80.5 kg 76.2 kg    Estimated body mass index is 27.11 kg/m as calculated from the following:   Height as of this encounter: _1  (1.676 m).   Weight as of this encounter: 76.2 kg.  LABS: CBC:    Component Value Date/Time   WBC 6.1 02/26/2019 0533   HGB 9.6 (L) 02/26/2019 0533   HGB 11.0 (L) 02/13/2019 0757   HCT 30.4 (L) 02/26/2019 0533   PLT 61 (L) 02/26/2019 0533   PLT 116 (L) 02/13/2019 0757   Comprehensive Metabolic Panel:     Component Value Date/Time   NA 138 02/26/2019 0533   K 5.0 02/26/2019 0533   CO2 29 02/26/2019 0533   BUN 7 (L) 02/26/2019 0533   CREATININE 0.49 02/26/2019 0533   CREATININE 0.70 02/13/2019 0757   CREATININE 0.89 10/06/2017 1504   ALBUMIN 3.0 (L) 02/22/2019 0351     Review of Systems  Unable to perform ROS: Acuity of condition    Physical Exam General: NAD, frail chronically-ill appearing Cardiovascular: regular rate and rhythm Pulmonary: rhonchi  Abdomen: soft, nontender, + bowel sounds GU: no suprapubic tenderness Extremities: no edema, no joint deformities Skin: no rashes, dryness and skin peeling noted to neck area Neurological: lethargic, confused, unable to follow commands   Prognosis: Guarded to Poor-in the setting of lethargy, confusion, poor po intake, malnutrition, streptococcus angionosis sepsis, suspected aspiration pneumonia, squamous cell carcinoma of the supraglottic larynx (stage IVB), electrolyte imbalance, deconditioning.  Discharge Planning:  To Be Determined  Recommendations:  Full Code  Continue with current care plan per medical team  [name] is long-time friend and main support. Patient has no documented AD per [name] and one living relative, [name] (cousin). [name] states she spoke to [place] who wanted no part in patient's care or decisions. I have reached out to [facility] for further discussions and clarification on her role as next of kin. [name] willing to stand in proxy for patient as medical decision maker once [name] relinquishes rights to decision making in needed. [name] also states she will reach out to [place] and tell her to call me back.    [name] states patient has  declined PEG in the past and made verbal wishes she was not interested, however she is unsure what she would decide if she is deemed the decision maker.   PMT will continue to support and follow   Palliative Performance Scale: SOMNOLENT/MALNUTRITION              [name] (friend)  expressed understanding and was in agreement with this plan.   The above conversation was completed via telephone due to the visitor restrictions during the COVID-19 pandemic. Thorough chart review and discussion with necessary members of the care team was completed as part of assessment.   Thank you for allowing the Palliative Medicine Team to assist in the care of this patient.  Time In: 1515 Time Out: 1630 Time Total: 75 min.   Visit consisted of counseling and education dealing with the complex and emotionally intense issues of symptom management and palliative care in the setting of serious and potentially life-threatening illness.Greater than 50%  of this time was spent counseling and coordinating care related to the above assessment and plan.  Signed by:  Alda Lea, AGPCNP-BC Palliative Medicine Team  Phone: [phone] Fax: [phone] Pager: [phone] Amion: Bjorn Pippin

## 2019-02-26 NOTE — Progress Notes (Signed)
Patient's iv infiltrated,iv team unable to restart another one since patient's upper extremity is swollen and weeping. Dr Karleen Hampshire notified, she states will order a PICC line placement. Will continue to monitor.

## 2019-02-26 NOTE — Progress Notes (Signed)
Peripherally Inserted Central Catheter/Midline Placement  The IV Nurse has discussed with the patient and/or persons authorized to consent for the patient, the purpose of this procedure and the potential benefits and risks involved with this procedure.  The benefits include less needle sticks, lab draws from the catheter, and the patient may be discharged home with the catheter. Risks include, but not limited to, infection, bleeding, blood clot (thrombus formation), and puncture of an artery; nerve damage and irregular heartbeat and possibility to perform a PICC exchange if needed/ordered by physician.  Alternatives to this procedure were also discussed.  B[employer] Power PICC patient education guide, fact sheet on infection prevention and patient information card has been provided to patient /or left at bedside.  Consent by medical necessary   PICC/Midline Placement Documentation  PICC Double Lumen 02/26/19 PICC Left Brachial 38 cm 0 cm (Active)  Indication for Insertion or Continuance of Line Poor Vasculature-patient has had multiple peripheral attempts or PIVs lasting less than 24 hours 02/26/19 1834  Exposed Catheter (cm) 0 cm 02/26/19 1834  Site Assessment Dry;Clean;Intact 02/26/19 1834  Lumen #1 Status Flushed;Saline locked;Blood return noted 02/26/19 1834  Lumen #2 Status Flushed;Saline locked;Blood return noted 02/26/19 1834  Dressing Type Transparent 02/26/19 1834  Dressing Status Clean;Dry;Intact;Antimicrobial disc in place 02/26/19 1834  Dressing Change Due 02/26/19 02/26/19 1834       Gordan Payment 02/26/2019, 6:37 PM

## 2019-02-26 NOTE — Progress Notes (Signed)
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  Patient fell at home, without loss of consciousness.  Currently on liquid diet and keeping up with oral intake apparently.  In the ER hypotensive hypothermic, admitted to ICU with septic shock suspected to be aspiration pneumonia her cancer is stage IVB(she is s/p 5 cycles of carboplatin & xrt>tolerated poorly) and declined feeding tube. 7/25 Patient was transferred to [facility] service  Assessment & Plan:   Principal Problem:   Hypothermia Active Problems:   Hypothyroidism   HYPERLIPIDEMIA, MIXED, MILD   HYPERTENSION, BENIGN ESSENTIAL   Malignant neoplasm of supraglottis (HCC)   Hypokalemia   Pancytopenia (HCC)   Hypotension   Septic shock (HCC)   Bacteremia   Streptococcus anginosis sepsis with septic shock: Needed pressors while in ICU, currently hemodynamically stable.  I discussed with Dr. Linus Salmons from id 7/25-antibiotics switched to penicillin to narrow the spectrum, echo reviewed no significant acute finding, ID does not recommend TEE unless persistently bacteremic. Will need to take her port out-and-IR planning to do it today.  Give line free period, blood culture and PICC line. Will need antibiotics 2 weeks after port is out. Steroids (for septic shock) was discontinued in ICU. I discussed with Dr. Marin Olp from oncology who agrees with plan to remove Port-A-Cath given strep sepsis.  Neutropenia unclear etiology likely in the setting of sepsis.  Had chemoradiation therapy but completed a few weeks ago, appreciated oncology input, started post Granix and WBC improved at 4300.  Suspected aspiration pneumonia: Continue antibiotics as above  along with suctioning, Mucomyst nebs. Continue IV fluid hydration. cxr 7/24-"Interval development of mild bibasilar atelectasis/infiltrate.Possible pneumonia".  Antibiotics with penicillin as number 1.  Continue aspiration precaution.  Remains n.p.o. at [facility] speech evaluation.  Appreciate speech input on board.  Patient had refused tube feeding in the past in the setting of her cancer, cont on iv dextrose drip  Hypothyroidism: Continue Synthroid iv, and change to PO once able to take p.o.  HLD: Continue her statin once able to take p.o.  Hypotension. Needs IV fluids and IV fluid resuscitation. Urgent PICC line Order placed as we were unable to place a peripheral line.   Malignant neoplasm of supraglottis: On radiation and chemo, had refused feeding tube in the past.  Completed chemoradiation few wks ago. Oncology on board.  remains full code, consulted palliative care for goals of care.   Hypokalemia/hypernatremia: Improved.  cotn ivf  Recurrent vomiting currently denies any vomiting.  Continue supportive care  Carotid artery stenosis status post left CEA-on aspirin Plavix, hold as NPO for Port-A-Cath.  Continue rectal aspirin for now.  DVT prophylaxis:  Code Status: full code.  Family Communication: none at bedside. No family available  Disposition Plan: pending clinical improvement.    Consultants:   Oncology  Palliative care.    Procedures: PICC line placement.   Antimicrobials: Penicillin.   Subjective: Not in distress.   Objective: Vitals:   02/26/19 0500 02/26/19 0815 02/26/19 1525 02/26/19 1700  BP:   (!) 72/57 111/80  Pulse:  95 (!) 112 (!) 108  Resp:  14 16   Temp:   97.6 F (36.4 C) 97.8 F (36.6 C)  TempSrc:   Oral Axillary  SpO2:  95% 100% 100%  Weight: 76.2 kg     Height:        Intake/Output Summary (Last 24 hours) at 02/26/2019 1904 Last data filed at 02/26/2019 1805 Gross per 24 hour  Intake 0 ml  Output --  Net 0 ml   Filed Weights    02/23/19 1623 02/25/19 0617 02/26/19 0500  Weight: 81.6 kg 80.5 kg 76.2 kg    Examination:  General exam: confused, lethargic Respiratory system: Clear to auscultation. Respiratory effort normal. Cardiovascular system: S1 & S2 heard, RRR. Gastrointestinal system: Abdomen is nondistended, soft and nontender. No organomegaly or masses felt. Normal bowel sounds heard. Central nervous system: confused, able to move all extremities.  Extremities: Symmetric 5 x 5 power. Skin: No rashes, lesions or ulcers Psychiatry: cannot be assessed.     Data Reviewed: I have personally reviewed following labs and imaging studies  CBC: Recent Labs  Lab 02/21/2019 1718  02/23/19 0829 02/24/19 0322 02/25/19 0350 02/25/19 0941 02/26/19 0533  WBC 2.8*   < > 1.7* 1.4* 4.4 4.3 6.1  NEUTROABS 2.5  --   --   --   --  3.9 5.4  HGB 10.5*   < > 8.5* 9.2* 9.2* 9.2* 9.6*  HCT 30.3*   < > 25.9* 28.1* 28.5* 28.7* 30.4*  MCV 92.4   < > 96.6 95.9 95.0 97.0 97.7  PLT 79*   < > 70* 67* 65* 64* 61*   < > = values in this interval not displayed.   Basic Metabolic Panel: Recent Labs  Lab 02/19/2019 1802 02/21/19 0510  02/22/19 2013 02/23/19 0418 02/24/19 0322 02/25/19 0350 02/26/19 0533  NA  --  148*   < > 147* 146* 142 140 138  K  --  3.4*   < > 3.6 3.8 3.8 4.3 5.0  CL  --  108   < > 109 106 99 96* 99  CO2  --  23   < > 32 32 35* 35* 29  GLUCOSE  --  122*   < > 116* 116* 148* 135* 113*  BUN  --  30*   < > 12 11 9  6* 7*  CREATININE  --  0.73   < > 0.71 0.61 0.40* 0.44 0.49  CALCIUM  --  8.6*   < > 8.3* 8.2* 8.2* 8.4* 8.6*  MG 2.1 2.1  --   --   --   --   --   --    < > = values in this interval not displayed.   GFR: Estimated Creatinine Clearance: 69.3 mL/min (by C-G formula based on SCr of 0.49 mg/dL). Liver Function Tests: Recent Labs  Lab 02/24/2019 1718 02/22/19 0351  AST 21 16  ALT 29 22  ALKPHOS 45 36*  BILITOT 1.5* 1.0  PROT 6.5 5.2*  ALBUMIN 3.8 3.0*   No results for input(s): LIPASE,  AMYLASE in the last 168 hours. No results for input(s): AMMONIA in the last 168 hours. Coagulation Profile: No results for input(s): INR, PROTIME in the last 168 hours. Cardiac Enzymes: No results for input(s): CKTOTAL, CKMB, CKMBINDEX, TROPONINI in the last 168 hours. BNP (last 3 results) No results for input(s): PROBNP in the last 8760 hours. HbA1C: No results for input(s): HGBA1C in the last 72 hours. CBG: No results for input(s): GLUCAP in the last 168 hours. Lipid Profile: No results for input(s): CHOL, HDL, LDLCALC, TRIG, CHOLHDL, LDLDIRECT in the last 72 hours. Thyroid Function Tests: No results for  input(s): TSH, T4TOTAL, FREET4, T3FREE, THYROIDAB in the last 72 hours. Anemia Panel: No results for input(s): VITAMINB12, FOLATE, FERRITIN, TIBC, IRON, RETICCTPCT in the last 72 hours. Sepsis Labs: Recent Labs  Lab 02/26/2019 1719 02/05/2019 1940  LATICACIDVEN 1.3 1.0    Recent Results (from the past 240 hour(s))  Blood culture (routine x 2)     Status: Abnormal   Collection Time: 02/02/2019  6:02 PM   Specimen: BLOOD  Result Value Ref Range Status   Specimen Description   Final    BLOOD RIGHT ANTECUBITAL Performed at [place] [address] [place], [place] [address]    Special Requests   Final    BOTTLES DRAWN AEROBIC AND ANAEROBIC Blood Culture adequate volume Performed at [facility] [address], [place], [place] [address]    Culture  Setup Time   Final    ANAEROBIC BOTTLE ONLY GRAM POSITIVE COCCI CRITICAL VALUE NOTED.  VALUE IS CONSISTENT WITH PREVIOUSLY REPORTED AND CALLED VALUE.    Culture (A)  Final    STREPTOCOCCUS ANGINOSIS SUSCEPTIBILITIES PERFORMED ON PREVIOUS CULTURE WITHIN THE LAST 5 DAYS. Performed at [facility], [place] [address], [place], [place] [address]    Report Status 02/23/2019 FINAL  Final  Blood culture (routine x 2)     Status: Abnormal   Collection Time: 02/15/2019  6:02 PM   Specimen: BLOOD   Result Value Ref Range Status   Specimen Description   Final    BLOOD RIGHT CHEST Performed at [facility] [address] [place], [place] [address]    Special Requests   Final    BOTTLES DRAWN AEROBIC AND ANAEROBIC Blood Culture adequate volume Performed at [facility] [address], [place], [place] [address]    Culture  Setup Time   Final    GRAM POSITIVE COCCI IN CHAINS IN BOTH AEROBIC AND ANAEROBIC BOTTLES CRITICAL RESULT CALLED TO, READ BACK BY AND VERIFIED WITHSeleta Rhymes [facility] 3875 02/21/19 A BROWNING Performed at [facility], [place] [address] [place], [place] [address]    Culture STREPTOCOCCUS ANGINOSIS (A)  Final   Report Status 02/23/2019 FINAL  Final   Organism ID, Bacteria STREPTOCOCCUS ANGINOSIS  Final      Susceptibility   Streptococcus anginosis - MIC*    PENICILLIN <=0.06 SENSITIVE Sensitive     CEFTRIAXONE 0.25 SENSITIVE Sensitive     ERYTHROMYCIN <=0.12 SENSITIVE Sensitive     LEVOFLOXACIN 0.5 SENSITIVE Sensitive     VANCOMYCIN 1 SENSITIVE Sensitive     * STREPTOCOCCUS ANGINOSIS  Blood Culture ID Panel (Reflexed)     Status: Abnormal   Collection Time: 02/22/2019  6:02 PM  Result Value Ref Range Status   Enterococcus species NOT DETECTED NOT DETECTED Final   Listeria monocytogenes NOT DETECTED NOT DETECTED Final   Staphylococcus species NOT DETECTED NOT DETECTED Final   Staphylococcus aureus (BCID) NOT DETECTED NOT DETECTED Final   Streptococcus species DETECTED (A) NOT DETECTED Final    Comment: Not Enterococcus species, Streptococcus agalactiae, Streptococcus pyogenes, or Streptococcus pneumoniae. CRITICAL RESULT CALLED TO, READ BACK BY AND VERIFIED WITH: Seleta Rhymes PHARMD [id number] 02/21/19 A BROWNING    Streptococcus agalactiae NOT DETECTED NOT DETECTED Final   Streptococcus pneumoniae NOT DETECTED NOT DETECTED Final   Streptococcus pyogenes NOT DETECTED NOT DETECTED Final   Acinetobacter baumannii NOT DETECTED  NOT DETECTED Final   Enterobacteriaceae species NOT DETECTED NOT DETECTED Final   Enterobacter cloacae complex  NOT DETECTED NOT DETECTED Final   Escherichia coli NOT DETECTED NOT DETECTED Final   Klebsiella oxytoca NOT DETECTED NOT DETECTED Final   Klebsiella pneumoniae NOT DETECTED NOT DETECTED Final   Proteus species NOT DETECTED NOT DETECTED Final   Serratia marcescens NOT DETECTED NOT DETECTED Final   Haemophilus influenzae NOT DETECTED NOT DETECTED Final   Neisseria meningitidis NOT DETECTED NOT DETECTED Final   Pseudomonas aeruginosa NOT DETECTED NOT DETECTED Final   Candida albicans NOT DETECTED NOT DETECTED Final   Candida glabrata NOT DETECTED NOT DETECTED Final   Candida krusei NOT DETECTED NOT DETECTED Final   Candida parapsilosis NOT DETECTED NOT DETECTED Final   Candida tropicalis NOT DETECTED NOT DETECTED Final    Comment: Performed at [facility], [place] [address], [place], [place] [address]  SARS Coronavirus 2 (CEPHEID - Performed in [facility] hospital lab), Hosp Order     Status: None   Collection Time: 02/04/2019  8:59 PM   Specimen: Nasopharyngeal Swab  Result Value Ref Range Status   SARS Coronavirus 2 NEGATIVE NEGATIVE Final    Comment: (NOTE) If result is NEGATIVE SARS-CoV-2 target nucleic acids are NOT DETECTED. The SARS-CoV-2 RNA is generally detectable in upper and lower  respiratory specimens during the acute phase of infection. The lowest  concentration of SARS-CoV-2 viral copies this assay can detect is 250  copies / mL. A negative result does not preclude SARS-CoV-2 infection  and should not be used as the sole basis for treatment or other  patient management decisions.  A negative result may occur with  improper specimen collection / handling, submission of specimen other  than nasopharyngeal swab, presence of viral mutation(s) within the  areas targeted by this assay, and inadequate number of viral copies  (<250 copies / mL). A negative result  must be combined with clinical  observations, patient history, and epidemiological information. If result is POSITIVE SARS-CoV-2 target nucleic acids are DETECTED. The SARS-CoV-2 RNA is generally detectable in upper and lower  respiratory specimens dur ing the acute phase of infection.  Positive  results are indicative of active infection with SARS-CoV-2.  Clinical  correlation with patient history and other diagnostic information is  necessary to determine patient infection status.  Positive results do  not rule out bacterial infection or co-infection with other viruses. If result is PRESUMPTIVE POSTIVE SARS-CoV-2 nucleic acids MAY BE PRESENT.   A presumptive positive result was obtained on the submitted specimen  and confirmed on repeat testing.  While 2019 novel coronavirus  (SARS-CoV-2) nucleic acids may be present in the submitted sample  additional confirmatory testing may be necessary for epidemiological  and / or clinical management purposes  to differentiate between  SARS-CoV-2 and other Sarbecovirus currently known to infect humans.  If clinically indicated additional testing with an alternate test  methodology [phone]) is advised. The SARS-CoV-2 RNA is generally  detectable in upper and lower respiratory sp ecimens during the acute  phase of infection. The expected result is Negative. Fact Sheet for Patients:  StrictlyIdeas.no Fact Sheet for Healthcare Providers: BankingDealers.co.za This test is not yet approved or cleared by the [place] FDA and has been authorized for detection and/or diagnosis of SARS-CoV-2 by FDA under an Emergency Use Authorization (EUA).  This EUA will remain in effect (meaning this test can be used) for the duration of the COVID-19 declaration under Section 564(b)(1) of the Act, 21 U.S.C. section 360bbb-3(b)(1), unless the authorization is terminated or revoked sooner. Performed at [facility]  [place] [address], [place], [place] [address]   MRSA PCR Screening     Status: None   Collection Time: 02/21/19  2:56 PM   Specimen: Nasal Mucosa; Nasopharyngeal  Result Value Ref Range Status   MRSA by PCR NEGATIVE NEGATIVE Final    Comment:        The GeneXpert MRSA Assay (FDA approved for NASAL specimens only), is one component of a comprehensive MRSA colonization surveillance program. It is not intended to diagnose MRSA infection nor to guide or monitor treatment for MRSA infections. Performed at [facility], [place] [address] [place], [place] [address]   Cath Tip Culture     Status: None (Preliminary result)   Collection Time: 02/25/19  2:03 PM   Specimen: Porta Cath; Other  Result Value Ref Range Status   Specimen Description   Final    PORTA CATH RIGHT CHEST Performed at [facility] [address] [place], [place] [address]    Special Requests   Final    NONE Performed at [facility], [place] [address], [place], [place] [address]    Culture   Final    NO GROWTH < 24 HOURS Performed at [facility] [place] [address] [place], [place] [address]    Report Status PENDING  Incomplete         Radiology Studies: Ir Removal Tun Access W/ [place] W/o [place] Mod Sed  Result Date: 02/25/2019 CLINICAL DATA:  History of head neck cancer, now admitted with bacteremia and concern for superimposed port infection. Port a catheter was placed by Dr. Laurence Ferrari on 12/06/2018 and had previously functioned well. EXAM: REMOVAL OF IMPLANTED TUNNELED PORT-A-CATH MEDICATIONS: Patient is currently admitted to the hospital receiving intravenous antibiotics. ANESTHESIA/SEDATION: None FLUOROSCOPY TIME:  None PROCEDURE: Informed written consent was obtained from the patient after a discussion of the risk, benefits and alternatives to the procedure. The patient was positioned supine on the fluoroscopy table and the right  chest Port-A-Cath site was prepped with chlorhexidine. A sterile gown and gloves were worn during the procedure. Local anesthesia was provided with 1% lidocaine with epinephrine. A timeout was performed prior to the initiation of the procedure. An incision was made overlying the Port-A-Cath with a #15 scalpel. Utilizing sharp and blunt dissection, the Port-A-Cath was removed completely. Port a Catheter was set aside for potential culture. The pocked was irrigated with sterile saline. Wound closure was performed with subcutaneous 2-0 Vicryl, subcuticular 4-0 Vicryl, Dermabond and Steri-Strips. A dressing was placed. The patient tolerated the procedure well without immediate post procedural complication. FINDINGS: Successful removal of implant Port-A-Cath without immediate post procedural complication. IMPRESSION: Successful removal of implanted Port-A-Cath. Electronically Signed   By: Sandi Mariscal M.D.   On: 02/25/2019 14:48   [place] Ekg Site Rite  Result Date: 02/26/2019 If Site Rite image not attached, placement could not be confirmed due to current cardiac rhythm.       Scheduled Meds:  acetylcysteine  4 mL Nebulization BID   aspirin  300 mg Rectal Daily   chlorhexidine  15 mL Mouth Rinse BID   Chlorhexidine Gluconate Cloth  6 each Topical Daily   levothyroxine  56 mcg Intravenous Q0600   mouth rinse  15 mL Mouth Rinse q12n4p   potassium chloride  40 mEq Oral Once   sodium chloride (PF)       sodium chloride flush  10-40 mL Intracatheter Q12H   sodium chloride flush  3 mL Intravenous Q12H   Continuous Infusions:  dextrose 5 % and 0.45 % NaCl with KCl 40 mEq/L 50 mL/hr (02/25/19 1940)   penicillin g continuous IV infusion 12 Million Units (02/25/19 2336)     LOS: 5 days    Time spent: 35 minutes.     Hosie Poisson, MD [facility] Pager [phone]  If 7PM-7AM, please contact night-coverage [email or web address] Password [facility] 02/26/2019, 7:04 PM

## 2019-02-26 NOTE — Progress Notes (Signed)
BP @ 1525 is 72/57,  Dr Karleen Hampshire notified.MEWS protocol followed.

## 2019-02-26 NOTE — Progress Notes (Signed)
SLP Cancellation Note  Patient Details Name: Gloria Murray MRN: [id number] DOB: 1951-04-09   Cancelled treatment:       Reason Eval/Treat Not Completed: (per notes, pt has been lethargic and confused, note possible plans for PEG and imaging of brain due to AMS, will follow up)   Macario Golds 02/26/2019, 3:43 PM   Luanna Salk, [facility] SLP [place] Pager [phone] Office [phone]

## 2019-02-27 ENCOUNTER — Inpatient Hospital Stay (HOSPITAL_COMMUNITY): Payer: Medicare HMO

## 2019-02-27 DIAGNOSIS — T451X5A Adverse effect of antineoplastic and immunosuppressive drugs, initial encounter: Secondary | ICD-10-CM

## 2019-02-27 DIAGNOSIS — G9340 Encephalopathy, unspecified: Secondary | ICD-10-CM

## 2019-02-27 DIAGNOSIS — D701 Agranulocytosis secondary to cancer chemotherapy: Secondary | ICD-10-CM

## 2019-02-27 DIAGNOSIS — D61818 Other pancytopenia: Secondary | ICD-10-CM

## 2019-02-27 DIAGNOSIS — E039 Hypothyroidism, unspecified: Secondary | ICD-10-CM

## 2019-02-27 DIAGNOSIS — I959 Hypotension, unspecified: Secondary | ICD-10-CM

## 2019-02-27 DIAGNOSIS — I9589 Other hypotension: Secondary | ICD-10-CM

## 2019-02-27 DIAGNOSIS — C321 Malignant neoplasm of supraglottis: Secondary | ICD-10-CM

## 2019-02-27 DIAGNOSIS — E861 Hypovolemia: Secondary | ICD-10-CM

## 2019-02-27 LAB — BLOOD GAS, ARTERIAL
Acid-Base Excess: 7.8 mmol/L — ABNORMAL HIGH (ref 0.0–2.0)
Bicarbonate: 32.2 mmol/L — ABNORMAL HIGH (ref 20.0–28.0)
Drawn by: 235321
O2 Content: 3 L/min
O2 Saturation: 99.1 %
Patient temperature: 98.6
pCO2 arterial: 46.5 mmHg (ref 32.0–48.0)
pH, Arterial: 7.455 — ABNORMAL HIGH (ref 7.350–7.450)
pO2, Arterial: 160 mmHg — ABNORMAL HIGH (ref 83.0–108.0)

## 2019-02-27 LAB — CBC
HCT: 24.6 % — ABNORMAL LOW (ref 36.0–46.0)
Hemoglobin: 8.1 g/dL — ABNORMAL LOW (ref 12.0–15.0)
MCH: 31 pg (ref 26.0–34.0)
MCHC: 32.9 g/dL (ref 30.0–36.0)
MCV: 94.3 fL (ref 80.0–100.0)
Platelets: 40 10*3/uL — ABNORMAL LOW (ref 150–400)
RBC: 2.61 MIL/uL — ABNORMAL LOW (ref 3.87–5.11)
RDW: 15.1 % (ref 11.5–15.5)
WBC: 1.8 10*3/uL — ABNORMAL LOW (ref 4.0–10.5)
nRBC: 0 % (ref 0.0–0.2)

## 2019-02-27 LAB — DIFFERENTIAL
Basophils Absolute: 0 10*3/uL (ref 0.0–0.1)
Basophils Relative: 1 %
Eosinophils Absolute: 0.1 10*3/uL (ref 0.0–0.5)
Eosinophils Relative: 4 %
Lymphocytes Relative: 7 %
Lymphs Abs: 0.1 10*3/uL — ABNORMAL LOW (ref 0.7–4.0)
Monocytes Absolute: 0.1 10*3/uL (ref 0.1–1.0)
Monocytes Relative: 4 %
Neutro Abs: 1.6 10*3/uL — ABNORMAL LOW (ref 1.7–7.7)
Neutrophils Relative %: 84 %

## 2019-02-27 LAB — PROTIME-INR
INR: 1 (ref 0.8–1.2)
Prothrombin Time: 12.9 seconds (ref 11.4–15.2)

## 2019-02-27 LAB — LACTIC ACID, PLASMA: Lactic Acid, Venous: 1.7 mmol/L (ref 0.5–1.9)

## 2019-02-27 MED ORDER — LORAZEPAM 2 MG/ML IJ SOLN
0.5000 mg | Freq: Once | INTRAMUSCULAR | Status: AC
  Administered 2019-02-27: 0.5 mg via INTRAVENOUS

## 2019-02-27 MED ORDER — LACTATED RINGERS IV BOLUS
1000.0000 mL | Freq: Once | INTRAVENOUS | Status: AC
  Administered 2019-02-27: 1000 mL via INTRAVENOUS

## 2019-02-27 MED ORDER — SODIUM CHLORIDE 0.9 % IV SOLN
2.0000 g | Freq: Three times a day (TID) | INTRAVENOUS | Status: DC
  Filled 2019-02-27 (×2): qty 2

## 2019-02-27 MED ORDER — ASPIRIN 300 MG RE SUPP
150.0000 mg | Freq: Every day | RECTAL | Status: DC
  Filled 2019-02-27: qty 1

## 2019-02-27 MED ORDER — SODIUM CHLORIDE 0.9 % IV BOLUS
1000.0000 mL | Freq: Once | INTRAVENOUS | Status: AC
  Administered 2019-02-27: 1000 mL via INTRAVENOUS

## 2019-02-27 MED ORDER — LORAZEPAM 2 MG/ML IJ SOLN
INTRAMUSCULAR | Status: AC
  Filled 2019-02-27: qty 1

## 2019-02-27 MED ORDER — SODIUM CHLORIDE (PF) 0.9 % IJ SOLN
INTRAMUSCULAR | Status: AC
  Filled 2019-02-27: qty 50

## 2019-02-27 MED ORDER — ASPIRIN 300 MG RE SUPP
150.0000 mg | Freq: Every day | RECTAL | Status: DC
  Administered 2019-02-28: 150 mg via RECTAL
  Filled 2019-02-27 (×2): qty 1

## 2019-02-27 MED ORDER — SODIUM CHLORIDE 0.9 % IV BOLUS: 500.0000 mL | Freq: Once | INTRAVENOUS | Status: DC | PRN

## 2019-02-27 MED ORDER — VANCOMYCIN HCL 10 G IV SOLR: 1500.0000 mg | INTRAVENOUS | Status: DC

## 2019-02-27 MED ORDER — ALBUMIN HUMAN 5 % IV SOLN
25.0000 g | Freq: Once | INTRAVENOUS | Status: AC
  Administered 2019-02-27: 25 g via INTRAVENOUS
  Filled 2019-02-27: qty 500

## 2019-02-27 MED ORDER — VANCOMYCIN HCL 10 G IV SOLR
1750.0000 mg | Freq: Once | INTRAVENOUS | Status: DC
  Filled 2019-02-27: qty 1750

## 2019-02-27 MED ORDER — IOHEXOL 300 MG/ML  SOLN
100.0000 mL | Freq: Once | INTRAMUSCULAR | Status: AC | PRN
  Administered 2019-02-27: 100 mL via INTRAVENOUS

## 2019-02-27 MED ORDER — PHENYLEPHRINE HCL-NACL 10-0.9 MG/250ML-% IV SOLN
0.0000 ug/min | INTRAVENOUS | Status: DC
  Administered 2019-02-27: 20 ug/min via INTRAVENOUS
  Administered 2019-02-28: 25 ug/min via INTRAVENOUS
  Filled 2019-02-27 (×2): qty 250

## 2019-02-27 MED ORDER — PENICILLIN G POTASSIUM 20000000 UNITS IJ SOLR
12.0000 10*6.[IU] | Freq: Two times a day (BID) | INTRAVENOUS | Status: DC
  Administered 2019-02-27 – 2019-02-28 (×2): 12 10*6.[IU] via INTRAVENOUS
  Filled 2019-02-27 (×3): qty 12

## 2019-02-27 MED ORDER — SODIUM CHLORIDE 0.9 % IV SOLN: INTRAVENOUS | Status: DC

## 2019-02-27 NOTE — Progress Notes (Addendum)
Pharmacy Antibiotic Note  Gloria Murray is a 68 y.o. female admitted on 02/06/2019 with sepsis.  Abx were narrowed to PCN for S anginosis.  However pt now hypothermic, hyposensive.   Pharmacy has been consulted to broaden back to cefepime and vancomycin dosing. Blood cx re-sent.    Plan: Cefepime 2 Gm IV q8h Vancomycin 1750 mg x1 then 1500 mg IV q24h for goal AUC = 400-550 Monitor renal function and cx data   Height: 5\' 6"  (167.6 cm) Weight: 171 lb 11.8 oz (77.9 kg) IBW/kg (Calculated) : 59.3  Temp (24hrs), Avg:92 F (33.3 C), Min:92 F (33.3 C), Max:92 F (33.3 C)  Recent Labs  Lab 02/22/19 2013 02/23/19 0418  02/24/19 0322 02/25/19 0350 02/25/19 0941 02/26/19 0533 02/27/19 0702  WBC  --   --    < > 1.4* 4.4 4.3 6.1 1.8*  CREATININE 0.71 0.61  --  0.40* 0.44  --  0.49  --    < > = values in this interval not displayed.    Estimated Creatinine Clearance: 69.9 mL/min (by C-G formula based on SCr of 0.49 mg/dL).    Allergies  Allergen Reactions  . Propoxyphene Hcl Itching and Nausea Only    REACTION: nausea and itch  . Propoxyphene N-Acetaminophen Itching and Nausea Only    REACTION: nausea and  itch    Antimicrobials this admission: 7/22 cefepime >> x1,  7/23 zosyn >>7/25 7/23 vancomycin >> 7/24 7/25 penicillin G continuous infusion 24 MU/day >>  Dose adjustments this admission:  Microbiology results: 7/22 BCx x2: 3/4 bottles w/ strep anginosus pansens 7/23 MRSA PCR: neg 7/22 Covid neg 7/29 BCx:   Thank you for allowing pharmacy to be a part of this patient's care.  Biagio Borg 02/27/2019 8:46 PM   Addendum: Damaris Schooner to Dr Gilford Raid.  No need to escalate antibiotics.  Resume penicillin as previously ordered. Netta Cedars, PharmD, BCPS 02/27/2019@9 :14 PM

## 2019-02-27 NOTE — Progress Notes (Signed)
HEMATOLOGY-ONCOLOGY PROGRESS NOTE  SUBJECTIVE: Patient sedated.  Nurse reports that she received 0.5 mg of Ativan for her CT scan because she was restless.  Opens eyes but is not able to answer questions this morning.  Oncology History  Malignant neoplasm of supraglottis ([place])  10/26/2018 Imaging   CT neck w/ contrast: IMPRESSION: 1. Bulky, partially necrotic bilateral cervical lymphadenopathy most concerning for metastatic disease, less likely unusual infection. 2. Prominent soft tissue at the tongue base with asymmetric left aryepiglottic fold thickening and slight fullness of the left palatine tonsil. Direct visualization is recommended to assess for a primary mucosal malignancy. 3.  Aortic Atherosclerosis (ICD10-I70.0).   11/08/2018 Procedure   DL with biopsy   11/08/2018 Pathology Results   Accession: [id number] 4. Larynx, biopsy, Left Supraglottis - INVASIVE SQUAMOUS CELL CARCINOMA. 2. Larynx, biopsy, Left Ary Epiglottic Fold - INVASIVE SQUAMOUS CELL CARCINOMA. 3. Tongue, biopsy, Left Vallecular Base - INVASIVE SQUAMOUS CELL CARCINOMA. 4. Larynx, biopsy, Right Vallecular Epiglottis - INVASIVE SQUAMOUS CELL CARCINOMA.   11/16/2018 Initial Diagnosis   Laryngeal cancer ([place])   11/23/2018 Imaging   PET: IMPRESSION: 1. Intensely hypermetabolic supraglottic mass predominantly involving the LEFT aryepiglottic fold but extending across midline anteriorly. Inferiorly hypermetabolic activity extends to the level of the larynx on the LEFT. 2. Bilateral bulky intensely hypermetabolic metastatic level II lymph nodes. Metastatic adenopathy extends with smaller nodes in the level III nodal stations bilaterally. 3. No evidence of thoracic metastasis. 4. Enlarged LEFT adrenal gland consistent with hyperplasia or lipid poor adenoma.   11/27/2018 Cancer Staging   Staging form: Larynx - Supraglottis, AJCC 8th Edition - Clinical stage from 11/27/2018: Stage IVB (cT2, cN3b, cM0) - Signed by  Eppie Gibson, MD on 11/27/2018   12/13/2018 - 01/29/2019 Chemotherapy   The patient had palonosetron (ALOXI) injection 0.25 mg, 0.25 mg, Intravenous,  Once, 5 of 7 cycles Administration: 0.25 mg (12/13/2018), 0.25 mg (12/20/2018), 0.25 mg (12/27/2018), 0.25 mg (01/03/2019), 0.25 mg (01/10/2019) CARBOplatin (PARAPLATIN) 210 mg in sodium chloride 0.9 % 250 mL chemo infusion, 210 mg (100 % of original dose 213.8 mg), Intravenous,  Once, 5 of 7 cycles Dose modification:   (original dose 213.8 mg, Cycle 1) Administration: 210 mg (12/13/2018), 210 mg (12/20/2018), 210 mg (12/27/2018), 210 mg (01/03/2019), 210 mg (01/10/2019)  for chemotherapy treatment.       REVIEW OF SYSTEMS:   Unable to obtain secondary to patient's sedation.  I have reviewed the past medical history, past surgical history, social history and family history with the patient and they are unchanged from previous note.   PHYSICAL EXAMINATION: ECOG PERFORMANCE STATUS: 3 - Symptomatic, >50% confined to bed  Vitals:   02/27/19 0521 02/27/19 0848  BP: 97/76   Pulse: 91   Resp: 18   Temp:    SpO2: 97% 100%   Filed Weights   02/25/19 0617 02/26/19 0500 02/27/19 0521  Weight: 177 lb 7.5 oz (80.5 kg) 167 lb 15.9 oz (76.2 kg) 171 lb 4.8 oz (77.7 kg)    Intake/Output from previous day: 07/28 0701 - 07/29 0700 In: 4854.7 [I.V.:1657.7; IV Piggyback:3197] Out: -   GENERAL: Chronically ill-appearing female, resting quietly, no distress SKIN: skin color, texture, turgor are normal, no rashes or significant lesions EYES: normal, Conjunctiva are pink and non-injected, sclera clear OROPHARYNX:no exudate, no erythema and lips, buccal mucosa, and tongue normal  NECK: Palpable adenopathy, right greater than left LUNGS: Coarse rhonchi in the anterior lung fields HEART: regular rate & rhythm and no murmurs and no lower  extremity edema ABDOMEN:abdomen soft, non-tender and normal bowel sounds Musculoskeletal:no cyanosis of digits and no clubbing   NEURO: alert & oriented x 3 with fluent speech, no focal motor/sensory deficits  LABORATORY DATA:  I have reviewed the data as listed CMP Latest Ref Rng & Units 02/26/2019 02/25/2019 02/24/2019  Glucose 70 - 99 mg/dL 113(H) 135(H) 148(H)  BUN 8 - 23 mg/dL 7(L) 6(L) 9  Creatinine 0.44 - 1.00 mg/dL 0.49 0.44 0.40(L)  Sodium 135 - 145 mmol/L 138 140 142  Potassium 3.5 - 5.1 mmol/L 5.0 4.3 3.8  Chloride 98 - 111 mmol/L 99 96(L) 99  CO2 22 - 32 mmol/L 29 35(H) 35(H)  Calcium 8.9 - 10.3 mg/dL 8.6(L) 8.4(L) 8.2(L)  Total Protein 6.5 - 8.1 g/dL - - -  Total Bilirubin 0.3 - 1.2 mg/dL - - -  Alkaline Phos 38 - 126 U/L - - -  AST 15 - 41 U/L - - -  ALT 0 - 44 U/L - - -    Lab Results  Component Value Date   WBC 1.8 (L) 02/27/2019   HGB 8.1 (L) 02/27/2019   HCT 24.6 (L) 02/27/2019   MCV 94.3 02/27/2019   PLT 40 (L) 02/27/2019   NEUTROABS 1.6 (L) 02/27/2019    Ir Removal Tun Access W/ [place] W/o Fl Mod Sed  Result Date: 02/25/2019 CLINICAL DATA:  History of head neck cancer, now admitted with bacteremia and concern for superimposed port infection. Port a catheter was placed by Dr. Laurence Ferrari on 12/06/2018 and had previously functioned well. EXAM: REMOVAL OF IMPLANTED TUNNELED PORT-A-CATH MEDICATIONS: Patient is currently admitted to the hospital receiving intravenous antibiotics. ANESTHESIA/SEDATION: None FLUOROSCOPY TIME:  None PROCEDURE: Informed written consent was obtained from the patient after a discussion of the risk, benefits and alternatives to the procedure. The patient was positioned supine on the fluoroscopy table and the right chest Port-A-Cath site was prepped with chlorhexidine. A sterile gown and gloves were worn during the procedure. Local anesthesia was provided with 1% lidocaine with epinephrine. A timeout was performed prior to the initiation of the procedure. An incision was made overlying the Port-A-Cath with a #15 scalpel. Utilizing sharp and blunt dissection, the Port-A-Cath  was removed completely. Port a Catheter was set aside for potential culture. The pocked was irrigated with sterile saline. Wound closure was performed with subcutaneous 2-0 Vicryl, subcuticular 4-0 Vicryl, Dermabond and Steri-Strips. A dressing was placed. The patient tolerated the procedure well without immediate post procedural complication. FINDINGS: Successful removal of implant Port-A-Cath without immediate post procedural complication. IMPRESSION: Successful removal of implanted Port-A-Cath. Electronically Signed   By: Sandi Mariscal M.D.   On: 02/25/2019 14:48   Dg Chest Port 1 View  Result Date: 02/22/2019 CLINICAL DATA:  Pneumonia EXAM: PORTABLE CHEST 1 VIEW COMPARISON:  02/18/2019 FINDINGS: Mild bibasilar airspace disease has developed since the prior study. Negative for heart failure or effusion. Heart size normal. Port-A-Cath tip at the cavoatrial junction unchanged. IMPRESSION: Interval development of mild bibasilar atelectasis/infiltrate. Possible pneumonia. Electronically Signed   By: Franchot Gallo M.D.   On: 02/22/2019 08:03   Dg Chest Port 1 View  Result Date: 01/30/2019 CLINICAL DATA:  Weakness.  Fall. EXAM: PORTABLE CHEST 1 VIEW COMPARISON:  PET-CT dated November 23, 2018. Chest x-ray dated Dec 10, 2008. FINDINGS: Right chest wall port catheter with tip at the cavoatrial junction. The heart size and mediastinal contours are within normal limits. Atherosclerotic calcification of the aortic arch. Normal pulmonary vascularity. No focal consolidation, pleural effusion,  or pneumothorax. No acute osseous abnormality. Left internal carotid artery stent. IMPRESSION: No active disease. Electronically Signed   By: Titus Dubin M.D.   On: 02/15/2019 17:43   Dg Swallowing Func-speech Pathology  Result Date: 02/22/2019 Objective Swallowing Evaluation: Type of Study: MBS-Modified Barium Swallow Study  Patient Details Name: RIANN OMAN MRN: [id number] Date of Birth: Oct 27, 1950 Today's Date: 02/22/2019  Time: SLP Start Time (ACUTE ONLY): 1415 -SLP Stop Time (ACUTE ONLY): 1450 SLP Time Calculation (min) (ACUTE ONLY): 35 min Past Medical History: Past Medical History: Diagnosis Date . Allergy  . Carotid artery occlusion  . Heart murmur  . Hyperlipidemia  . Hypertension  . Hypothyroidism  . Laryngeal mass  . Osteoporosis  . Thyroid disease  . Wears glasses  . Wears upper complete and lower partial dentures  Past Surgical History: Past Surgical History: Procedure Laterality Date . ARCH AORTOGRAM N/A 07/23/2014  Procedure: ARCH AORTOGRAM;  Surgeon: Rosetta Posner, MD;  Location: [facility] CATH LAB;  Service: Cardiovascular;  Laterality: N/A; . CAROTID ANGIOGRAM N/A 07/23/2014  Procedure: CAROTID ANGIOGRAM;  Surgeon: Rosetta Posner, MD;  Location: [facility] CATH LAB;  Service: Cardiovascular;  Laterality: N/A; . CAROTID ENDARTERECTOMY  12/11/2008  left . CAROTID STENT INSERTION Left 10/14/2014  Procedure: CAROTID STENT INSERTION;  Surgeon: Serafina Mitchell, MD;  Location: [facility] CATH LAB;  Service: Cardiovascular;  Laterality: Left;   carotid . CARPAL TUNNEL RELEASE    X 2, right wrist . COLONOSCOPY  Jan. 2, 2015 . DILATION AND CURETTAGE OF UTERUS    X 2 . IR IMAGING GUIDED PORT INSERTION  12/06/2018 . laceration hand Right   hand . LARYNGOSCOPY N/A 11/08/2018  Procedure: DIRECT MICROLARYNGOSCOPY WITH BIOPSY;  Surgeon: Melida Quitter, MD;  Location: [place];  Service: ENT;  Laterality: N/A; . RIGID ESOPHAGOSCOPY N/A 11/08/2018  Procedure: ESOPHAGOSCOPY;  Surgeon: Melida Quitter, MD;  Location: [place];  Service: ENT;  Laterality: N/A; . THYROIDECTOMY   . TUBAL LIGATION  1975  bilateral HPI: 68 yo female with treatment for Laryngeal cancer, involving epiglottis currently undergoing chemo and radiation therapy, carotid artery occlusion, heart murmur, hypothyroidism, osteoporosis, hyperlipidemia.  Pt CXR 02/22/2019 showed Interval development of mild bibasilar atelectasis/infiltrate.  Pt had n/v prior to admission ? source of pna but with head and neck cancer,  pharyngeal dysphagia may contribute.  Subjective: pt awake in bed Assessment / Plan / Recommendation CHL IP CLINICAL IMPRESSIONS 02/22/2019 Clinical Impression Patient presents with severe oropharyngeal dysphagia due to XRT, edema and decreased motility.  She is aspirating secretions and barium intake *largely silently* and unable to clear aspirates or with cued cough/expectoration.  Delayed oral transiting noted resulting in premature spillage of barium into pharynx.  Swallow is weak and pharyngeal trigger is delayed resulting in laryngeal penetration and aspiration and significant residuals. Various postures including chin tuck with and without head turn left *side with edema* and neck extension did not improve airway protection nor pharyngeal clearance.  At this time, swallow dysfunction does not allow pt to consume po efficiently or without aspiration.  Educated her to findings using teach back and live feed.   Will follow up for swallowing exercises and indication for repeat instrumental swallow evaluation.   Hopeful for continued swallow function with decreased edema and healing from XRT.  Recommend pt be able to consume water and ice chips freely after oral care to decrease disuse muscle atrophy.        SLP Visit Diagnosis Dysphagia, oropharyngeal phase (R13.12) Attention and concentration deficit  following -- Frontal lobe and executive function deficit following -- Impact on safety and function --   CHL IP TREATMENT RECOMMENDATION 02/22/2019 Treatment Recommendations Therapy as outlined in treatment plan below   Prognosis 02/22/2019 Prognosis for Safe Diet Advancement Guarded Barriers to Reach Goals Severity of deficits Barriers/Prognosis Comment -- CHL IP DIET RECOMMENDATION 02/22/2019 SLP Diet Recommendations NPO;Free water protocol after oral care Liquid Administration via -- Medication Administration Via alternative means Compensations -- Postural Changes --   CHL IP OTHER RECOMMENDATIONS 02/22/2019  Recommended Consults -- Oral Care Recommendations Oral care QID Other Recommendations Have oral suction available   CHL IP FOLLOW UP RECOMMENDATIONS 02/22/2019 Follow up Recommendations Home health SLP   CHL IP FREQUENCY AND DURATION 02/22/2019 Speech Therapy Frequency (ACUTE ONLY) min 2x/week Treatment Duration 2 weeks      CHL IP ORAL PHASE 02/22/2019 Oral Phase Impaired Oral - Pudding Teaspoon -- Oral - Pudding Cup -- Oral - Honey Teaspoon -- Oral - Honey Cup -- Oral - Nectar Teaspoon Delayed oral transit;Reduced posterior propulsion;Premature spillage;Decreased bolus cohesion;Weak lingual manipulation Oral - Nectar Cup Delayed oral transit;Reduced posterior propulsion;Premature spillage;Decreased bolus cohesion;Weak lingual manipulation Oral - Nectar Straw -- Oral - Thin Teaspoon Delayed oral transit;Reduced posterior propulsion;Premature spillage;Decreased bolus cohesion;Weak lingual manipulation Oral - Thin Cup Delayed oral transit;Reduced posterior propulsion;Premature spillage;Decreased bolus cohesion;Weak lingual manipulation Oral - Thin Straw Reduced posterior propulsion;Delayed oral transit;Premature spillage;Decreased bolus cohesion;Weak lingual manipulation Oral - Puree Delayed oral transit;Reduced posterior propulsion;Premature spillage;Decreased bolus cohesion;Weak lingual manipulation Oral - Mech Soft -- Oral - Regular -- Oral - Multi-Consistency -- Oral - Pill -- Oral Phase - Comment --  CHL IP PHARYNGEAL PHASE 02/22/2019 Pharyngeal Phase Impaired Pharyngeal- Pudding Teaspoon -- Pharyngeal -- Pharyngeal- Pudding Cup -- Pharyngeal -- Pharyngeal- Honey Teaspoon -- Pharyngeal -- Pharyngeal- Honey Cup -- Pharyngeal -- Pharyngeal- Nectar Teaspoon -- Pharyngeal -- Pharyngeal- Nectar Cup Reduced pharyngeal peristalsis;Reduced epiglottic inversion;Reduced anterior laryngeal mobility;Reduced laryngeal elevation;Reduced airway/laryngeal closure;Reduced tongue base retraction Pharyngeal Material enters airway,  passes BELOW cords without attempt by patient to eject out (silent aspiration) Pharyngeal- Nectar Straw -- Pharyngeal -- Pharyngeal- Thin Teaspoon Reduced pharyngeal peristalsis;Reduced epiglottic inversion;Reduced laryngeal elevation;Reduced airway/laryngeal closure;Reduced tongue base retraction;Pharyngeal residue - valleculae;Pharyngeal residue - pyriform;Penetration/Aspiration during swallow Pharyngeal Material enters airway, passes BELOW cords without attempt by patient to eject out (silent aspiration) Pharyngeal- Thin Cup Reduced pharyngeal peristalsis;Pharyngeal residue - valleculae;Reduced laryngeal elevation;Pharyngeal residue - pyriform;Reduced airway/laryngeal closure;Reduced tongue base retraction;Reduced epiglottic inversion;Penetration/Aspiration during swallow;Moderate aspiration Pharyngeal Material enters airway, passes BELOW cords without attempt by patient to eject out (silent aspiration) Pharyngeal- Thin Straw Reduced pharyngeal peristalsis;Pharyngeal residue - pyriform;Pharyngeal residue - valleculae;Moderate aspiration;Reduced epiglottic inversion;Reduced anterior laryngeal mobility;Reduced laryngeal elevation;Compensatory strategies attempted (with notebox);Reduced airway/laryngeal closure;Penetration/Apiration after swallow;Penetration/Aspiration during swallow Pharyngeal Material enters airway, passes BELOW cords without attempt by patient to eject out (silent aspiration) Pharyngeal- Puree Reduced epiglottic inversion;Reduced tongue base retraction;Reduced laryngeal elevation;Reduced anterior laryngeal mobility;Reduced pharyngeal peristalsis;Pharyngeal residue - valleculae Pharyngeal -- Pharyngeal- Mechanical Soft -- Pharyngeal -- Pharyngeal- Regular -- Pharyngeal -- Pharyngeal- Multi-consistency -- Pharyngeal -- Pharyngeal- Pill -- Pharyngeal -- Pharyngeal Comment chin tuck not helpful to prevent aspiration, neck extension  CHL IP CERVICAL ESOPHAGEAL PHASE 02/22/2019 Cervical Esophageal Phase  WFL Pudding Teaspoon -- Pudding Cup -- Honey Teaspoon -- Honey Cup -- Nectar Teaspoon -- Nectar Cup -- Nectar Straw -- Thin Teaspoon -- Thin Cup -- Thin Straw -- Puree -- Mechanical Soft -- Regular -- Multi-consistency -- Pill -- Cervical Esophageal Comment -- Macario Golds 02/22/2019, 5:01 PM  Luanna Salk, MS [facility] SLP Acute Rehab Services Pager [phone] Office [phone]           [place] Ekg Site Rite  Result Date: 02/26/2019 If [facility] image not attached, placement could not be confirmed due to current cardiac rhythm.   ASSESSMENT AND PLAN: Streptococcus angionosis sepsis -S. Anginosis in two sets of blood cultures -On IV Penicillin -Case discussed with ID by hospitalist - recommend PAC removal and 2 weeks of antibiotics -PAC removed on 02/25/2019 -PAC tip culture negative to date -Recommend repeat blood cultures  Suspected aspiration pneumonia -Continue IV antibiotics -On mucomyst nebs -SLP has evaluated the patient and barium swallow performed - noted to be aspirating secretions and barium intake, largely silently, and unable to clear aspirates with acute cough/expectoration -Regular diet this morning - patient unable to eat this -Recommend SLP re-evaluate patient for appropriateness of diet -May need to consider feeding tube placement for nutrition (she has declined this in the past)  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 -S/pdefinitive chemoRT with weekly carboplatin x 5 dose; treatment complicated by prolonged cytopenias and poor tolerance  -Patient declined feeding tube placement due to lack of social support  -On exam, she still has somewhat firm bilateral cervical adenopathy (R ~2-3cm, L ~1cm) -Will reevaluate with a CT of the neck and chest to assess response to treatment -results pending -Will also obtain CT of brain to evaluate for brain mets given her somnolence and confusion -results pending -She has PRN Zofran available to  her  Pancytopenia -Likely due to recent sepsis -Received Granix X 2 doses with improvement of her white blood cell count and ANC -White blood cell count 1.8 this morning; differential added and ANC is 1.6 -No Granix indicated unless ANC is less than 1.0 -Recommend daily CBC with differential -She has persistent mild anemia and thrombocytopenia -hemoglobin and platelets are drifting down; no bleeding.  Pancytopenia likely secondary to acute infection/sepsis. -Transfuse packed red blood cells for hemoglobin less than 7.0 and transfuse platelets for platelet count less than 20,000 or active bleeding -No transfusion is indicated today  Hypomagnesemia -Secondary to electrolyte wasting from chemotherapy -Mg  2.1 this hospital admission -Continue to monitor   Hypokalemia -Secondary to electrolyte wasting from chemotherapy -Holding hydrochlorothiazide -Status post IV and oral supplementation -Potassium not checked today  Oral candidiasis -Has received a prolonged course of fluconazole -No recurrent oral candidiasis -Continue oral care  Protein malnutrition -Secondary to chemoradiation -Has declined a feeding tube in the past due to lack of social support and patient preference  -Receiving regular diet, but patient at high risk for aspiration  -Recommend SLP to evaluate for appropriateness of diet -Recommend consideration of feeding tube placement this hospital admission    LOS: 6 days   Mikey Bussing, DNP, AGPCNP-BC, AOCNP 02/27/19

## 2019-02-27 NOTE — Consult Note (Signed)
Chief Complaint: Patient was seen in consultation today for supraglottic carcinoma, dysphagia  Referring Physician(s): Dr. Karleen Hampshire  Supervising Physician: Jacqulynn Cadet  Patient Status: [facility] - In-pt  History of Present Illness: Gloria Murray is a 68 y.o. female with history of supraglottic throat cancer.  She is known to IR from prior Port-A-Cath placement which has been removed due to sepsis with bacteremia.  Patient now with progressive decline in mental status since admission with ongoing work up for metastatic disease vs. Delirium/encephalopathy with inability to eat.  IR consulted for gastrostomy tube placement.   Anatomy reviewed on recent PET scan by Dr. Laurence Ferrari.  Patient is a candidate for percutaneous attempt in IR.  Additional barriers exist that will need to be addressed prior to placement, however.  Patient currently confused with altered mental status. No next of kin of HCPOA is available for decision making at this time.  She has also previously indicated she would not want a feeding tube even if recommended by medical team.  Palliative care on board to assist with goals of care, however next of kin thus far not available.   Patient assessed at bedside this AM.  She is altered.  Awakens to my presence but does not engage.  Attempts to remove O2 tubing and pulls at bedsheets when I assessed her abdomen.  Otherwise very little interaction.    Past Medical History:  Diagnosis Date   Allergy    Carotid artery occlusion    Heart murmur    Hyperlipidemia    Hypertension    Hypothyroidism    Laryngeal mass    Osteoporosis    Thyroid disease    Wears glasses    Wears upper complete and lower partial dentures     Past Surgical History:  Procedure Laterality Date   ARCH AORTOGRAM N/A 07/23/2014   Procedure: ARCH AORTOGRAM;  Surgeon: Rosetta Posner, MD;  Location: [facility] CATH LAB;  Service: Cardiovascular;  Laterality: N/A;   CAROTID ANGIOGRAM N/A 07/23/2014   Procedure: CAROTID ANGIOGRAM;  Surgeon: Rosetta Posner, MD;  Location: [facility] CATH LAB;  Service: Cardiovascular;  Laterality: N/A;   CAROTID ENDARTERECTOMY  12/11/2008   left   CAROTID STENT INSERTION Left 10/14/2014   Procedure: CAROTID STENT INSERTION;  Surgeon: Serafina Mitchell, MD;  Location: [facility] CATH LAB;  Service: Cardiovascular;  Laterality: Left;   carotid   CARPAL TUNNEL RELEASE     X 2, right wrist   COLONOSCOPY  Jan. 2, 2015   DILATION AND CURETTAGE OF UTERUS     X 2   IR IMAGING GUIDED PORT INSERTION  12/06/2018   IR REMOVAL TUN ACCESS W/ PORT W/O FL MOD SED  02/25/2019   laceration hand Right    hand   LARYNGOSCOPY N/A 11/08/2018   Procedure: DIRECT MICROLARYNGOSCOPY WITH BIOPSY;  Surgeon: Melida Quitter, MD;  Location: [place];  Service: ENT;  Laterality: N/A;   RIGID ESOPHAGOSCOPY N/A 11/08/2018   Procedure: ESOPHAGOSCOPY;  Surgeon: Melida Quitter, MD;  Location: [place];  Service: ENT;  Laterality: N/A;   THYROIDECTOMY     TUBAL LIGATION  1975   bilateral    Allergies: Propoxyphene hcl and Propoxyphene n-acetaminophen  Medications: Prior to Admission medications   Medication Sig Start Date End Date Taking? Authorizing Provider  Ascorbic Acid (VITAMIN C PO) Take 1 tablet by mouth daily.   Yes [provider]  aspirin EC 81 MG tablet Take 81 mg by mouth daily.   Yes [provider]  CALCIUM PO Take 1 tablet by mouth daily.   Yes [provider]  Cholecalciferol (VITAMIN D3 PO) Take 1 tablet by mouth daily.   Yes [provider]  clopidogrel (PLAVIX) 75 MG tablet TAKE 1 TABLET (75 MG TOTAL) BY MOUTH DAILY. 11/02/18  Yes Baity, Coralie Keens, NP  fexofenadine (ALLEGRA) 180 MG tablet Take 180 mg by mouth at bedtime.    Yes [provider]  fluticasone (FLONASE) 50 MCG/ACT nasal spray Place 1 spray into both nostrils daily.    Yes [provider]  Garlic 4696 MG CAPS Take 1 capsule by mouth 2 (two) times daily.     Yes [provider]  hydrochlorothiazide (MICROZIDE) 12.5 MG capsule TAKE 1 CAPSULE EVERY DAY 12/17/18  Yes Jearld Fenton, NP  levothyroxine (SYNTHROID) 112 MCG tablet TAKE 1 TABLET EVERY DAY 12/17/18  Yes Baity, Coralie Keens, NP  lidocaine (XYLOCAINE) 2 % solution Patient: Mix 1part 2% viscous lidocaine, 1part H20. Swish & swallow 14mL of diluted mixture, 65min before meals and at bedtime, up to QID 12/10/18  Yes Eppie Gibson, MD  lidocaine-prilocaine (EMLA) cream Apply to affected area once 01/16/19  Yes Tish Men, MD  metoprolol succinate (TOPROL-XL) 25 MG 24 hr tablet TAKE 1 TABLET EVERY DAY 12/17/18  Yes Baity, Coralie Keens, NP  Omega-3 Fatty Acids (FISH OIL) 1000 MG CAPS Take 1 capsule (1,000 mg total) by mouth daily. 04/11/11  Yes Lucille Passy, MD  ondansetron (ZOFRAN) 8 MG tablet Take 1 tablet (8 mg total) by mouth 2 (two) times daily as needed for refractory nausea / vomiting. Start on day 3 after chemotherapy. 01/23/19  Yes Tish Men, MD  potassium chloride SA (K-DUR) 20 MEQ tablet Take 1 tablet (20 mEq total) by mouth daily. 02/19/19 03/21/19 Yes Tish Men, MD  prochlorperazine (COMPAZINE) 10 MG tablet Take 1 tablet (10 mg total) by mouth every 6 (six) hours as needed (Nausea or vomiting). 01/23/19  Yes Tish Men, MD  simvastatin (ZOCOR) 40 MG tablet TAKE 1 TABLET AT BEDTIME 12/17/18  Yes Baity, Coralie Keens, NP  sodium fluoride (PREVIDENT 5000 PLUS) 1.1 % CREA dental cream Apply cream to tooth brush. Brush teeth for 2 minutes. Spit out excess. DO NOT rinse afterwards. Repeat nightly. 11/22/18  Yes Lenn Cal, DDS  VITAMIN E PO Take 1 capsule by mouth daily.   Yes [provider]  fluconazole (DIFLUCAN) 100 MG tablet Take 2 tabs on the first day, and then 1 tab daily until finished Patient not taking: Reported on 02/27/2019 02/06/19   Tish Men, MD  LORazepam (ATIVAN) 0.5 MG tablet Take 1 tablet (0.5 mg total) by mouth every 6 (six) hours as needed (Nausea or vomiting). Patient not taking: Reported  on 02/15/2019 11/28/18   Tish Men, MD  traMADol (ULTRAM) 50 MG tablet Take 1 tablet (50 mg total) by mouth every 8 (eight) hours as needed. Mail to the patient please. Patient has [employer] of $700.00. Patient taking differently: Take 50 mg by mouth every 8 (eight) hours as needed for moderate pain or severe pain. Mail to the patient please. Patient has [employer] of $700.00. 12/19/18   Tish Men, MD     Family History  Problem Relation Age of Onset   Diabetes Mother    Colon cancer Neg Hx     Social History   Socioeconomic History   Marital status: Widowed    Spouse name: Not on file   Number of children: Not on file  Years of education: Not on file   Highest education level: Not on file  Occupational History   Occupation: [employer]    Employer: [employer] resource strain: Not on file   Food insecurity    Worry: Not on file    Inability: Not on file   Transportation needs    Medical: No    Non-medical: No  Tobacco Use   Smoking status: Former Smoker    Packs/day: 0.50    Types: Cigarettes    Quit date: 07/23/2014    Years since quitting: 4.6   Smokeless tobacco: Never Used   Tobacco comment: quit smoking cigarettes in " 2018-19"  Substance and Sexual Activity   Alcohol use: No    Alcohol/week: 0.0 standard drinks   Drug use: No   Sexual activity: Never  Lifestyle   Physical activity    Days per week: Not on file    Minutes per session: Not on file   Stress: Not on file  Relationships   Social connections    Talks on phone: Not on file    Gets together: Not on file    Attends religious service: Not on file    Active member of club or organization: Not on file    Attends meetings of clubs or organizations: Not on file    Relationship status: Not on file  Other Topics Concern   Not on file  Social History Narrative   Does not have a living will.   Does not have any family.      [place] "make decisions"  for her-desires CPR, does not want prolonged life support if futile.      Both of her children- died in car accident at ages 23 and 44- husband went into a diabetic coma while driving.     Review of Systems: A 12 point ROS discussed and pertinent positives are indicated in the HPI above.  All other systems are negative.  Review of Systems  Unable to perform ROS: Mental status change    Vital Signs: BP 111/76 (BP Location: Right Arm)    Pulse 80    Temp 98 F (36.7 C) (Axillary)    Resp 16    Ht 5\' 6"  (1.676 m)    Wt 171 lb 4.8 oz (77.7 kg)    SpO2 100%    BMI 27.65 kg/m   Physical Exam Vitals signs and nursing note reviewed.  Constitutional:      Appearance: Normal appearance.  HENT:     Mouth/Throat:     Mouth: Mucous membranes are moist.     Pharynx: Oropharynx is clear.  Cardiovascular:     Rate and Rhythm: Normal rate and regular rhythm.     Heart sounds: No murmur. No friction rub.  Pulmonary:     Effort: Pulmonary effort is normal. No respiratory distress.     Breath sounds: Normal breath sounds.  Abdominal:     General: There is no distension.     Palpations: Abdomen is soft.     Tenderness: There is no abdominal tenderness.  Skin:    General: Skin is warm and dry.  Neurological:     General: No focal deficit present.     Mental Status: She is alert and oriented to person, place, and time.  Psychiatric:        Mood and Affect: Mood normal.        Behavior: Behavior normal.  Thought Content: Thought content normal.        Judgment: Judgment normal.      MD Evaluation Airway: WNL Heart: WNL Abdomen: WNL Chest/ Lungs: WNL ASA  Classification: 3 Mallampati/Airway Score: Two   Imaging: Ct Head W & Wo Contrast  Result Date: 02/27/2019 CLINICAL DATA:  68 year old female with confusion, lethargy. Supraglottic carcinoma. Staging. EXAM: CT HEAD WITHOUT AND WITH CONTRAST TECHNIQUE: Contiguous axial images were obtained from the base of the skull through  the vertex without and with intravenous contrast CONTRAST:  154mL OMNIPAQUE IOHEXOL 300 MG/ML  SOLN COMPARISON:  Neck CT today reported separately. FINDINGS: Brain: Study is mildly degraded by motion artifact despite repeated imaging attempts. Cerebral volume is within normal limits for age. Minimal to mild for age scattered white matter hypodensity. No midline shift, ventriculomegaly, mass effect, evidence of mass lesion, intracranial hemorrhage or evidence of cortically based acute infarction. No abnormal enhancement identified. Vascular: Calcified atherosclerosis at the skull base. The major intracranial vascular structures are enhancing as expected. Skull: Motion artifact at the skull base. No acute or suspicious osseous lesion identified. Sinuses/Orbits: Minor sphenoid sinus mucosal thickening, otherwise well pneumatized. Other: Negative visible orbit and scalp soft tissues. IMPRESSION: No acute or metastatic intracranial abnormality. Essentially normal for age CT appearance of the brain. Electronically Signed   By: Genevie Ann M.D.   On: 02/27/2019 12:00   Ct Soft Tissue Neck W Contrast  Result Date: 02/27/2019 CLINICAL DATA:  History of head/neck cancer, restaging. Additional history: Restaging of supraglottic cancer, chemotherapy completed 01/29/2019. Confusion, pancytopenia, sepsis. EXAM: CT NECK WITH CONTRAST TECHNIQUE: Multidetector CT imaging of the neck was performed using the standard protocol following the bolus administration of intravenous contrast. CONTRAST:  127mL OMNIPAQUE IOHEXOL 300 MG/ML  SOLN COMPARISON:  NECK CT 10/26/2018. FINDINGS: Pharynx and larynx: Please note the examination is moderately motion degraded, which limits evaluation. Within this limitation, there is a similar appearance of soft tissue prominence at the tongue base/vallecula bilaterally which is partially inseparable from the lingual surface of the epiglottis. Thickening of the left aryepiglottic fold was better appreciated  on prior examination, although this may be secondary to motion on the current study. There is generalized soft tissue swelling within the supraglottic larynx which is new from prior exam. Resultant mild to moderate airway narrowing. No obvious abnormality of the true vocal cords. Unchanged asymmetric sclerosis of the left arytenoid cartilage. Salivary glands: No abnormality identified. Thyroid: Status post thyroidectomy. Lymph nodes: Bulky heterogeneous right level 2A lymphadenopathy persists but has decreased in size since prior examination, now measuring 3.8 x 3.0 cm (previously 5.0 x 3.3 cm). Previously demonstrated left level 2A lymphadenopathy has significantly decreased, now measuring 3.2 x 2.0 cm in transaxial dimensions (previously 5.4 x 2.7 cm). No new cervical lymphadenopathy is identified. Vascular: Prominent atherosclerotic disease at the right carotid bifurcation. Redemonstrated left carotid stent. As before, right-sided lymphadenopathy partially encases the right internal jugular vein with mild luminal narrowing. Left-sided lymphadenopathy partially encases the proximal left cervical internal carotid artery without significant luminal narrowing. Limited intracranial: Unremarkable Visualized orbits: Unremarkable Mastoids and visualized paranasal sinuses: Small amount of scattered fluid within the bilateral ethmoid and sphenoid sinuses. No significant mastoid effusion. Skeleton: Cervical spondylosis with advanced multilevel disc and facet degeneration. Upper chest: Please refer to concurrent chest CT for description of intrathoracic findings. IMPRESSION: Moderately motion degraded examination, limiting assessment. Soft tissue prominence at the tongue base/vallecula bilaterally has not significantly changed since neck CT 10/26/2018. Findings likely reflect residual tumor, possibly  with superimposed treatment related changes. Thickening of the left aryepiglottic fold was better appreciated on prior exam,  although this may be related to motion degradation of the current study. Nonspecific generalized soft tissue thickening of the supraglottic larynx may reflect treatment related changes. Consider direct visualization, as warranted. Resultant mild to moderate airway narrowing. Persistent although decreased bilateral level IIA lymphadenopathy likely reflecting nodal metastases. No new cervical lymphadenopathy. Electronically Signed   By: Kellie Simmering   On: 02/27/2019 12:42   Ct Chest W Contrast  Result Date: 02/27/2019 CLINICAL DATA:  Supraglottic carcinoma. Chemotherapy complete 01/29/2019. EXAM: CT CHEST WITH CONTRAST TECHNIQUE: Multidetector CT imaging of the chest was performed during intravenous contrast administration. CONTRAST:  167mL OMNIPAQUE IOHEXOL 300 MG/ML  SOLN COMPARISON:  None. FINDINGS: Cardiovascular: Coronary artery calcification and aortic atherosclerotic calcification. Mediastinum/Nodes: No axillary or supraclavicular adenopathy. No mediastinal adenopathy. Central venous line with tip in the distal SVC. Lungs/Pleura: bibasilar atelectasis. No pneumonia or aspiration pneumonitis identified. No pulmonary nodularity. Upper Abdomen: No hiatal hernia.  Stomach in expected location. Musculoskeletal: No aggressive osseous lesion. IMPRESSION: 1. No thoracic metastasis. 2. Normal stomach anatomy. 3. Coronary artery calcification and Aortic Atherosclerosis (ICD10-I70.0). Electronically Signed   By: Suzy Bouchard M.D.   On: 02/27/2019 11:13   Ir Removal [employer] W/o Fl Mod Sed  Result Date: 02/25/2019 CLINICAL DATA:  History of head neck cancer, now admitted with bacteremia and concern for superimposed port infection. Port a catheter was placed by Dr. Laurence Ferrari on 12/06/2018 and had previously functioned well. EXAM: REMOVAL OF IMPLANTED TUNNELED PORT-A-CATH MEDICATIONS: Patient is currently admitted to the hospital receiving intravenous antibiotics. ANESTHESIA/SEDATION: None FLUOROSCOPY  TIME:  None PROCEDURE: Informed written consent was obtained from the patient after a discussion of the risk, benefits and alternatives to the procedure. The patient was positioned supine on the fluoroscopy table and the right chest Port-A-Cath site was prepped with chlorhexidine. A sterile gown and gloves were worn during the procedure. Local anesthesia was provided with 1% lidocaine with epinephrine. A timeout was performed prior to the initiation of the procedure. An incision was made overlying the Port-A-Cath with a #15 scalpel. Utilizing sharp and blunt dissection, the Port-A-Cath was removed completely. Port a Catheter was set aside for potential culture. The pocked was irrigated with sterile saline. Wound closure was performed with subcutaneous 2-0 Vicryl, subcuticular 4-0 Vicryl, Dermabond and Steri-Strips. A dressing was placed. The patient tolerated the procedure well without immediate post procedural complication. FINDINGS: Successful removal of implant Port-A-Cath without immediate post procedural complication. IMPRESSION: Successful removal of implanted Port-A-Cath. Electronically Signed   By: Sandi Mariscal M.D.   On: 02/25/2019 14:48   Dg Chest Port 1 View  Result Date: 02/22/2019 CLINICAL DATA:  Pneumonia EXAM: PORTABLE CHEST 1 VIEW COMPARISON:  01/30/2019 FINDINGS: Mild bibasilar airspace disease has developed since the prior study. Negative for heart failure or effusion. Heart size normal. Port-A-Cath tip at the cavoatrial junction unchanged. IMPRESSION: Interval development of mild bibasilar atelectasis/infiltrate. Possible pneumonia. Electronically Signed   By: Franchot Gallo M.D.   On: 02/22/2019 08:03   Dg Chest Port 1 View  Result Date: 02/08/2019 CLINICAL DATA:  Weakness.  Fall. EXAM: PORTABLE CHEST 1 VIEW COMPARISON:  PET-CT dated November 23, 2018. Chest x-ray dated Dec 10, 2008. FINDINGS: Right chest wall port catheter with tip at the cavoatrial junction. The heart size and mediastinal  contours are within normal limits. Atherosclerotic calcification of the aortic arch. Normal pulmonary vascularity. No focal consolidation, pleural  effusion, or pneumothorax. No acute osseous abnormality. Left internal carotid artery stent. IMPRESSION: No active disease. Electronically Signed   By: Titus Dubin M.D.   On: 02/11/2019 17:43   Dg Swallowing Func-speech Pathology  Result Date: 02/22/2019 Objective Swallowing Evaluation: Type of Study: MBS-Modified Barium Swallow Study  Patient Details Name: CLAUDEEN LEASON MRN: [id number] Date of Birth: Jul 09, 1951 Today's Date: 02/22/2019 Time: SLP Start Time (ACUTE ONLY): 1415 -SLP Stop Time (ACUTE ONLY): 1450 SLP Time Calculation (min) (ACUTE ONLY): 35 min Past Medical History: Past Medical History: Diagnosis Date  Allergy   Carotid artery occlusion   Heart murmur   Hyperlipidemia   Hypertension   Hypothyroidism   Laryngeal mass   Osteoporosis   Thyroid disease   Wears glasses   Wears upper complete and lower partial dentures  Past Surgical History: Past Surgical History: Procedure Laterality Date  ARCH AORTOGRAM N/A 07/23/2014  Procedure: ARCH AORTOGRAM;  Surgeon: Rosetta Posner, MD;  Location: [facility] CATH LAB;  Service: Cardiovascular;  Laterality: N/A;  CAROTID ANGIOGRAM N/A 07/23/2014  Procedure: CAROTID ANGIOGRAM;  Surgeon: Rosetta Posner, MD;  Location: [facility] CATH LAB;  Service: Cardiovascular;  Laterality: N/A;  CAROTID ENDARTERECTOMY  12/11/2008  left  CAROTID STENT INSERTION Left 10/14/2014  Procedure: CAROTID STENT INSERTION;  Surgeon: Serafina Mitchell, MD;  Location: [facility] CATH LAB;  Service: Cardiovascular;  Laterality: Left;   carotid  CARPAL TUNNEL RELEASE    X 2, right wrist  COLONOSCOPY  Jan. 2, 2015  DILATION AND CURETTAGE OF UTERUS    X 2  IR IMAGING GUIDED PORT INSERTION  12/06/2018  laceration hand Right   hand  LARYNGOSCOPY N/A 11/08/2018  Procedure: DIRECT MICROLARYNGOSCOPY WITH BIOPSY;  Surgeon: Melida Quitter, MD;  Location: [place];  Service:  ENT;  Laterality: N/A;  RIGID ESOPHAGOSCOPY N/A 11/08/2018  Procedure: ESOPHAGOSCOPY;  Surgeon: Melida Quitter, MD;  Location: [facility] OR;  Service: ENT;  Laterality: N/A;  THYROIDECTOMY    TUBAL LIGATION  1975  bilateral HPI: 68 yo female with treatment for Laryngeal cancer, involving epiglottis currently undergoing chemo and radiation therapy, carotid artery occlusion, heart murmur, hypothyroidism, osteoporosis, hyperlipidemia.  Pt CXR 02/22/2019 showed Interval development of mild bibasilar atelectasis/infiltrate.  Pt had n/v prior to admission ? source of pna but with head and neck cancer, pharyngeal dysphagia may contribute.  Subjective: pt awake in bed Assessment / Plan / Recommendation CHL IP CLINICAL IMPRESSIONS 02/22/2019 Clinical Impression Patient presents with severe oropharyngeal dysphagia due to XRT, edema and decreased motility.  She is aspirating secretions and barium intake *largely silently* and unable to clear aspirates or with cued cough/expectoration.  Delayed oral transiting noted resulting in premature spillage of barium into pharynx.  Swallow is weak and pharyngeal trigger is delayed resulting in laryngeal penetration and aspiration and significant residuals. Various postures including chin tuck with and without head turn left *side with edema* and neck extension did not improve airway protection nor pharyngeal clearance.  At this time, swallow dysfunction does not allow pt to consume po efficiently or without aspiration.  Educated her to findings using teach back and live feed.   Will follow up for swallowing exercises and indication for repeat instrumental swallow evaluation.   Hopeful for continued swallow function with decreased edema and healing from XRT.  Recommend pt be able to consume water and ice chips freely after oral care to decrease disuse muscle atrophy.        SLP Visit Diagnosis Dysphagia, oropharyngeal phase (R13.12) Attention and concentration deficit  following -- Frontal lobe and  executive function deficit following -- Impact on safety and function --   CHL IP TREATMENT RECOMMENDATION 02/22/2019 Treatment Recommendations Therapy as outlined in treatment plan below   Prognosis 02/22/2019 Prognosis for Safe Diet Advancement Guarded Barriers to Reach Goals Severity of deficits Barriers/Prognosis Comment -- CHL IP DIET RECOMMENDATION 02/22/2019 SLP Diet Recommendations NPO;Free water protocol after oral care Liquid Administration via -- Medication Administration Via alternative means Compensations -- Postural Changes --   CHL IP OTHER RECOMMENDATIONS 02/22/2019 Recommended Consults -- Oral Care Recommendations Oral care QID Other Recommendations Have oral suction available   CHL IP FOLLOW UP RECOMMENDATIONS 02/22/2019 Follow up Recommendations Home health SLP   CHL IP FREQUENCY AND DURATION 02/22/2019 Speech Therapy Frequency (ACUTE ONLY) min 2x/week Treatment Duration 2 weeks      CHL IP ORAL PHASE 02/22/2019 Oral Phase Impaired Oral - Pudding Teaspoon -- Oral - Pudding Cup -- Oral - Honey Teaspoon -- Oral - Honey Cup -- Oral - Nectar Teaspoon Delayed oral transit;Reduced posterior propulsion;Premature spillage;Decreased bolus cohesion;Weak lingual manipulation Oral - Nectar Cup Delayed oral transit;Reduced posterior propulsion;Premature spillage;Decreased bolus cohesion;Weak lingual manipulation Oral - Nectar Straw -- Oral - Thin Teaspoon Delayed oral transit;Reduced posterior propulsion;Premature spillage;Decreased bolus cohesion;Weak lingual manipulation Oral - Thin Cup Delayed oral transit;Reduced posterior propulsion;Premature spillage;Decreased bolus cohesion;Weak lingual manipulation Oral - Thin Straw Reduced posterior propulsion;Delayed oral transit;Premature spillage;Decreased bolus cohesion;Weak lingual manipulation Oral - Puree Delayed oral transit;Reduced posterior propulsion;Premature spillage;Decreased bolus cohesion;Weak lingual manipulation Oral - Mech Soft -- Oral - Regular -- Oral -  Multi-Consistency -- Oral - Pill -- Oral Phase - Comment --  CHL IP PHARYNGEAL PHASE 02/22/2019 Pharyngeal Phase Impaired Pharyngeal- Pudding Teaspoon -- Pharyngeal -- Pharyngeal- Pudding Cup -- Pharyngeal -- Pharyngeal- Honey Teaspoon -- Pharyngeal -- Pharyngeal- Honey Cup -- Pharyngeal -- Pharyngeal- Nectar Teaspoon -- Pharyngeal -- Pharyngeal- Nectar Cup Reduced pharyngeal peristalsis;Reduced epiglottic inversion;Reduced anterior laryngeal mobility;Reduced laryngeal elevation;Reduced airway/laryngeal closure;Reduced tongue base retraction Pharyngeal Material enters airway, passes BELOW cords without attempt by patient to eject out (silent aspiration) Pharyngeal- Nectar Straw -- Pharyngeal -- Pharyngeal- Thin Teaspoon Reduced pharyngeal peristalsis;Reduced epiglottic inversion;Reduced laryngeal elevation;Reduced airway/laryngeal closure;Reduced tongue base retraction;Pharyngeal residue - valleculae;Pharyngeal residue - pyriform;Penetration/Aspiration during swallow Pharyngeal Material enters airway, passes BELOW cords without attempt by patient to eject out (silent aspiration) Pharyngeal- Thin Cup Reduced pharyngeal peristalsis;Pharyngeal residue - valleculae;Reduced laryngeal elevation;Pharyngeal residue - pyriform;Reduced airway/laryngeal closure;Reduced tongue base retraction;Reduced epiglottic inversion;Penetration/Aspiration during swallow;Moderate aspiration Pharyngeal Material enters airway, passes BELOW cords without attempt by patient to eject out (silent aspiration) Pharyngeal- Thin Straw Reduced pharyngeal peristalsis;Pharyngeal residue - pyriform;Pharyngeal residue - valleculae;Moderate aspiration;Reduced epiglottic inversion;Reduced anterior laryngeal mobility;Reduced laryngeal elevation;Compensatory strategies attempted (with notebox);Reduced airway/laryngeal closure;Penetration/Apiration after swallow;Penetration/Aspiration during swallow Pharyngeal Material enters airway, passes BELOW cords without  attempt by patient to eject out (silent aspiration) Pharyngeal- Puree Reduced epiglottic inversion;Reduced tongue base retraction;Reduced laryngeal elevation;Reduced anterior laryngeal mobility;Reduced pharyngeal peristalsis;Pharyngeal residue - valleculae Pharyngeal -- Pharyngeal- Mechanical Soft -- Pharyngeal -- Pharyngeal- Regular -- Pharyngeal -- Pharyngeal- Multi-consistency -- Pharyngeal -- Pharyngeal- Pill -- Pharyngeal -- Pharyngeal Comment chin tuck not helpful to prevent aspiration, neck extension  CHL IP CERVICAL ESOPHAGEAL PHASE 02/22/2019 Cervical Esophageal Phase WFL Pudding Teaspoon -- Pudding Cup -- Honey Teaspoon -- Honey Cup -- Nectar Teaspoon -- Nectar Cup -- Nectar Straw -- Thin Teaspoon -- Thin Cup -- Thin Straw -- Puree -- Mechanical Soft -- Regular -- Multi-consistency -- Pill -- Cervical Esophageal Comment -- Macario Golds 02/22/2019, 5:01 PM  Luanna Salk, MS [facility] SLP Acute Rehab Services Pager [phone] Office [phone]           [place] Ekg Site Rite  Result Date: 02/26/2019 If [facility] image not attached, placement could not be confirmed due to current cardiac rhythm.   Labs:  CBC: Recent Labs    02/25/19 0350 02/25/19 0941 02/26/19 0533 02/27/19 0702  WBC 4.4 4.3 6.1 1.8*  HGB 9.2* 9.2* 9.6* 8.1*  HCT 28.5* 28.7* 30.4* 24.6*  PLT 65* 64* 61* 40*    COAGS: Recent Labs    12/06/18 1322 02/27/19 0702  INR 1.0 1.0    BMP: Recent Labs    02/23/19 0418 02/24/19 0322 02/25/19 0350 02/26/19 0533  NA 146* 142 140 138  K 3.8 3.8 4.3 5.0  CL 106 99 96* 99  CO2 32 35* 35* 29  GLUCOSE 116* 148* 135* 113*  BUN 11 9 6* 7*  CALCIUM 8.2* 8.2* 8.4* 8.6*  CREATININE 0.61 0.40* 0.44 0.49  GFRNONAA >60 >60 >60 >60  GFRAA >60 >60 >60 >60    LIVER FUNCTION TESTS: Recent Labs    10/19/18 1417 11/21/18 1251 02/23/2019 1718 02/22/19 0351  BILITOT 0.5 0.4 1.5* 1.0  AST 13 15 21 16   ALT 15 17 29 22   ALKPHOS 68 67 45 36*  PROT 7.3 7.2 6.5 5.2*    ALBUMIN 4.5 4.1 3.8 3.0*    TUMOR MARKERS: No results for input(s): AFPTM, CEA, CA199, CHROMGRNA in the last 8760 hours.  Assessment and Plan: Dysphagia Altered mental status Patient with history of supraglottic throat cancer.  She is known to IR from recent Port-A-Cath placement and subsequent removal due to bacteremia.  IR now consulted for gastrostomy tube placement at the request of Dr. Karleen Hampshire.  Patient is likely a candidate for percutaneous attempt, however need clarification for goals of care as well as consenting party for procedure.   If placement warranted, will also need to trend platelets.  Will likely require platelet infusion on the day of placement.   IR following.   Thank you for this interesting consult.  I greatly enjoyed meeting RHANDA LEMIRE and look forward to participating in their care.  A copy of this report was sent to the requesting provider on this date.  Electronically Signed: Docia Barrier, PA 02/27/2019, 3:51 PM   I spent a total of 40 Minutes    in face to face in clinical consultation, greater than 50% of which was counseling/coordinating care for supraglottic carcinoma.

## 2019-02-27 NOTE — Progress Notes (Signed)
Assisted tele visit to patient with family member.  Gloria Scoggins McEachran, RN  

## 2019-02-27 NOTE — Progress Notes (Signed)
CT called stating pt restless and unable to remain still for scan. MD notified. Order received for one time dose of Ativan.

## 2019-02-27 NOTE — Progress Notes (Signed)
SLP Cancellation Note  Patient Details Name: Gloria Murray MRN: [id number] DOB: 1951-07-13   Cancelled treatment:         RN reports pt remains lethargic. Note plan for PEG tube for nutrition as her dysphagia was noted to be severe per MBS early in hospitalization when she was fully alert.  With pt's lethargy, her baseline severe dysphagia likely be grossly impaired at this point.  Will continue efforts for dysphagia management/treatment depending on pt's HCPOA GOC.     Luanna Salk, MS [facility] SLP Acute Rehab Services Pager [phone] Office [phone]   Macario Golds 02/27/2019, 5:41 PM

## 2019-02-27 NOTE — Progress Notes (Signed)
Rapid Response Event Note  Overview: called RRT to patients room for decreased LOC      Initial Focused Assessment: patient unable to follow commands, opens eyes spontaneously, moves all extremities. 3L o2 currently in place, respirations irregular with periods of apnea.   MD paged and order for ABG and to tx to SDU. See VS flowsheet.    Interventions: RT to collect ABG, transfer to SDU       Shereen Marton B

## 2019-02-27 NOTE — Progress Notes (Addendum)
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  Patient fell at home, without loss of consciousness.  Currently on liquid diet and keeping up with oral intake apparently.  In the ER hypotensive hypothermic, admitted to ICU with septic shock suspected to be aspiration pneumonia her cancer is stage IVB(she is s/p 5 cycles of carboplatin & xrt>tolerated poorly) and declined feeding tube. 7/25 Patient was transferred to [facility] service  Assessment & Plan:   Principal Problem:   Hypothermia Active Problems:   Hypothyroidism   HYPERLIPIDEMIA, MIXED, MILD   HYPERTENSION, BENIGN ESSENTIAL   Malignant neoplasm of supraglottis (HCC)   Hypokalemia   Pancytopenia (HCC)   Hypotension   Septic shock (HCC)   Bacteremia   Encephalopathy   Streptococcus anginosis sepsis with septic shock: Needed pressors while in ICU, currently hemodynamically stable. Discussed with Dr. Linus Salmons from id 7/25-antibiotics switched to penicillin to narrow the spectrum, echo reviewed no significant acute finding, ID does not recommend TEE unless persistently bacteremic. Port a cath removed by IR. PICC line placed.  Will need antibiotics 2 weeks after port is out. Steroids (for septic shock) was discontinued in ICU. Discussed with Dr. Marin Olp from oncology who agrees with plan to remove Port-A-Cath given strep sepsis.  Neutropenia unclear etiology likely in the setting of sepsis.  Had chemoradiation therapy but completed a few weeks ago, appreciated oncology input, started post Granix and WBC improved .  Suspected aspiration pneumonia: Continue antibiotics as above along with suctioning, Mucomyst nebs. Continue IV fluid  hydration. cxr 7/24-"Interval development of mild bibasilar atelectasis/infiltrate.Possible pneumonia".  Antibiotics with penicillin.  Continue aspiration precaution.  Remains n.p.o. as per speech evaluation.  Appreciate speech input on board.  pt has refused tube feeds in the past due to lack of social support.   Hypothyroidism: Continue Synthroid iv, and change to PO once able to take p.o.  HLD: Continue her statin once able to take p.o.  Hypotension. Needs IV fluids and IV fluid resuscitation. Urgent PICC line Order placed as we were unable to place a peripheral line.   Malignant neoplasm of supraglottis: On radiation and chemo, had refused feeding tube in the past.  Completed chemoradiation few wks ago. Oncology on board.  remains full code, consulted palliative care for goals of care. Unable to secure a consenting family member to talk about goals of care and for medical decisions. Will need ethics consult.   Hypokalemia/hypernatremia: Improved.   Recurrent vomiting currently denies any vomiting.  Continue supportive care  Carotid artery stenosis status post left CEA-on aspirin Plavix, . plavix on hold for worsening thrombocytopenia.   Continue rectal aspirin for now.  DVT prophylaxis: scd's Code Status: full code.  Family Communication: none at bedside. No family available  Disposition Plan: pending clinical improvement.    Consultants:   Oncology  Palliative care.    Procedures: PICC line placement.   Antimicrobials: Penicillin.   Subjective: Not in distress. Confused.   Objective: Vitals:   02/26/19 2311 02/27/19 0521 02/27/19 0848 02/27/19 1300  BP: (!) 101/58 97/76  111/76  Pulse: 94 91  80  Resp:  18  16  Temp:      TempSrc:      SpO2:  97% 100%  Weight:  77.7 kg    Height:        Intake/Output Summary (Last 24 hours) at 02/27/2019 1615 Last data filed at 02/27/2019 0450 Gross per 24 hour  Intake 4854.69 ml  Output -  Net 4854.69 ml   Filed  Weights   02/25/19 0617 02/26/19 0500 02/27/19 0521  Weight: 80.5 kg 76.2 kg 77.7 kg    Examination:  General exam: confused, lethargic Respiratory system: diminished at bases. No wheezing heard.  Cardiovascular system: S1 & S2 heard, RRR. Gastrointestinal system: Abdomen is nondistended, soft and nontender. No organomegaly or masses felt. Normal bowel sounds heard. Central nervous system: confused, able to move all extremities.  Extremities: Symmetric 5 x 5 power. Skin: No rashes, lesions or ulcers Psychiatry: cannot be assessed.     Data Reviewed: I have personally reviewed following labs and imaging studies  CBC: Recent Labs  Lab 02/25/2019 1718  02/24/19 0322 02/25/19 0350 02/25/19 0941 02/26/19 0533 02/27/19 0702  WBC 2.8*   < > 1.4* 4.4 4.3 6.1 1.8*  NEUTROABS 2.5  --   --   --  3.9 5.4 1.6*  HGB 10.5*   < > 9.2* 9.2* 9.2* 9.6* 8.1*  HCT 30.3*   < > 28.1* 28.5* 28.7* 30.4* 24.6*  MCV 92.4   < > 95.9 95.0 97.0 97.7 94.3  PLT 79*   < > 67* 65* 64* 61* 40*   < > = values in this interval not displayed.   Basic Metabolic Panel: Recent Labs  Lab 02/02/2019 1802 02/21/19 0510  02/22/19 2013 02/23/19 0418 02/24/19 0322 02/25/19 0350 02/26/19 0533  NA  --  148*   < > 147* 146* 142 140 138  K  --  3.4*   < > 3.6 3.8 3.8 4.3 5.0  CL  --  108   < > 109 106 99 96* 99  CO2  --  23   < > 32 32 35* 35* 29  GLUCOSE  --  122*   < > 116* 116* 148* 135* 113*  BUN  --  30*   < > 12 11 9  6* 7*  CREATININE  --  0.73   < > 0.71 0.61 0.40* 0.44 0.49  CALCIUM  --  8.6*   < > 8.3* 8.2* 8.2* 8.4* 8.6*  MG 2.1 2.1  --   --   --   --   --   --    < > = values in this interval not displayed.   GFR: Estimated Creatinine Clearance: 69.9 mL/min (by C-G formula based on SCr of 0.49 mg/dL). Liver Function Tests: Recent Labs  Lab 02/12/2019 1718 02/22/19 0351  AST 21 16  ALT 29 22  ALKPHOS 45 36*  BILITOT 1.5* 1.0  PROT 6.5 5.2*  ALBUMIN 3.8 3.0*   No results for input(s): LIPASE,  AMYLASE in the last 168 hours. No results for input(s): AMMONIA in the last 168 hours. Coagulation Profile: Recent Labs  Lab 02/27/19 0702  INR 1.0   Cardiac Enzymes: No results for input(s): CKTOTAL, CKMB, CKMBINDEX, TROPONINI in the last 168 hours. BNP (last 3 results) No results for input(s): PROBNP in the last 8760 hours. HbA1C: No results for input(s): HGBA1C in the last 72 hours. CBG: No results for input(s): GLUCAP in the last 168 hours. Lipid Profile: No results for input(s): CHOL, HDL, LDLCALC, TRIG, CHOLHDL, LDLDIRECT in the last 72 hours. Thyroid Function Tests: No results for input(s): TSH, T4TOTAL, FREET4, T3FREE, THYROIDAB in  the last 72 hours. Anemia Panel: No results for input(s): VITAMINB12, FOLATE, FERRITIN, TIBC, IRON, RETICCTPCT in the last 72 hours. Sepsis Labs: Recent Labs  Lab 02/28/2019 1719 02/01/2019 1940  LATICACIDVEN 1.3 1.0    Recent Results (from the past 240 hour(s))  Blood culture (routine x 2)     Status: Abnormal   Collection Time: 02/13/2019  6:02 PM   Specimen: BLOOD  Result Value Ref Range Status   Specimen Description   Final    BLOOD RIGHT ANTECUBITAL Performed at [facility] [address] [place], [place] [address]    Special Requests   Final    BOTTLES DRAWN AEROBIC AND ANAEROBIC Blood Culture adequate volume Performed at [facility] [address], [place], [place] [address]    Culture  Setup Time   Final    ANAEROBIC BOTTLE ONLY GRAM POSITIVE COCCI CRITICAL VALUE NOTED.  VALUE IS CONSISTENT WITH PREVIOUSLY REPORTED AND CALLED VALUE.    Culture (A)  Final    STREPTOCOCCUS ANGINOSIS SUSCEPTIBILITIES PERFORMED ON PREVIOUS CULTURE WITHIN THE LAST 5 DAYS. Performed at [facility], [place] [address] [place], [place] [address]    Report Status 02/23/2019 FINAL  Final  Blood culture (routine x 2)     Status: Abnormal   Collection Time: 02/12/2019  6:02 PM   Specimen: BLOOD  Result Value  Ref Range Status   Specimen Description   Final    BLOOD RIGHT CHEST Performed at [facility] [address], [place], [place] [address]    Special Requests   Final    BOTTLES DRAWN AEROBIC AND ANAEROBIC Blood Culture adequate volume Performed at [facility] [address] [place], [place] [address]    Culture  Setup Time   Final    GRAM POSITIVE COCCI IN CHAINS IN BOTH AEROBIC AND ANAEROBIC BOTTLES CRITICAL RESULT CALLED TO, READ BACK BY AND VERIFIED WITHSeleta Rhymes [facility] 5465 02/21/19 A BROWNING Performed at [facility], [place] [address], [place], [place] [address]    Culture STREPTOCOCCUS ANGINOSIS (A)  Final   Report Status 02/23/2019 FINAL  Final   Organism ID, Bacteria STREPTOCOCCUS ANGINOSIS  Final      Susceptibility   Streptococcus anginosis - MIC*    PENICILLIN <=0.06 SENSITIVE Sensitive     CEFTRIAXONE 0.25 SENSITIVE Sensitive     ERYTHROMYCIN <=0.12 SENSITIVE Sensitive     LEVOFLOXACIN 0.5 SENSITIVE Sensitive     VANCOMYCIN 1 SENSITIVE Sensitive     * STREPTOCOCCUS ANGINOSIS  Blood Culture ID Panel (Reflexed)     Status: Abnormal   Collection Time: 02/19/2019  6:02 PM  Result Value Ref Range Status   Enterococcus species NOT DETECTED NOT DETECTED Final   Listeria monocytogenes NOT DETECTED NOT DETECTED Final   Staphylococcus species NOT DETECTED NOT DETECTED Final   Staphylococcus aureus (BCID) NOT DETECTED NOT DETECTED Final   Streptococcus species DETECTED (A) NOT DETECTED Final    Comment: Not Enterococcus species, Streptococcus agalactiae, Streptococcus pyogenes, or Streptococcus pneumoniae. CRITICAL RESULT CALLED TO, READ BACK BY AND VERIFIED WITH: Seleta Rhymes PHARMD [id number] 02/21/19 A BROWNING    Streptococcus agalactiae NOT DETECTED NOT DETECTED Final   Streptococcus pneumoniae NOT DETECTED NOT DETECTED Final   Streptococcus pyogenes NOT DETECTED NOT DETECTED Final   Acinetobacter baumannii NOT DETECTED NOT DETECTED  Final   Enterobacteriaceae species NOT DETECTED NOT DETECTED Final   Enterobacter cloacae complex NOT DETECTED NOT DETECTED Final  Escherichia coli NOT DETECTED NOT DETECTED Final   Klebsiella oxytoca NOT DETECTED NOT DETECTED Final   Klebsiella pneumoniae NOT DETECTED NOT DETECTED Final   Proteus species NOT DETECTED NOT DETECTED Final   Serratia marcescens NOT DETECTED NOT DETECTED Final   Haemophilus influenzae NOT DETECTED NOT DETECTED Final   Neisseria meningitidis NOT DETECTED NOT DETECTED Final   Pseudomonas aeruginosa NOT DETECTED NOT DETECTED Final   Candida albicans NOT DETECTED NOT DETECTED Final   Candida glabrata NOT DETECTED NOT DETECTED Final   Candida krusei NOT DETECTED NOT DETECTED Final   Candida parapsilosis NOT DETECTED NOT DETECTED Final   Candida tropicalis NOT DETECTED NOT DETECTED Final    Comment: Performed at [facility], [place] [address] [place], [place] [address]  SARS Coronavirus 2 (CEPHEID - Performed in [place] hospital lab), Hosp Order     Status: None   Collection Time: 02/17/2019  8:59 PM   Specimen: Nasopharyngeal Swab  Result Value Ref Range Status   SARS Coronavirus 2 NEGATIVE NEGATIVE Final    Comment: (NOTE) If result is NEGATIVE SARS-CoV-2 target nucleic acids are NOT DETECTED. The SARS-CoV-2 RNA is generally detectable in upper and lower  respiratory specimens during the acute phase of infection. The lowest  concentration of SARS-CoV-2 viral copies this assay can detect is 250  copies / mL. A negative result does not preclude SARS-CoV-2 infection  and should not be used as the sole basis for treatment or other  patient management decisions.  A negative result may occur with  improper specimen collection / handling, submission of specimen other  than nasopharyngeal swab, presence of viral mutation(s) within the  areas targeted by this assay, and inadequate number of viral copies  (<250 copies / mL). A negative result must be combined  with clinical  observations, patient history, and epidemiological information. If result is POSITIVE SARS-CoV-2 target nucleic acids are DETECTED. The SARS-CoV-2 RNA is generally detectable in upper and lower  respiratory specimens dur ing the acute phase of infection.  Positive  results are indicative of active infection with SARS-CoV-2.  Clinical  correlation with patient history and other diagnostic information is  necessary to determine patient infection status.  Positive results do  not rule out bacterial infection or co-infection with other viruses. If result is PRESUMPTIVE POSTIVE SARS-CoV-2 nucleic acids MAY BE PRESENT.   A presumptive positive result was obtained on the submitted specimen  and confirmed on repeat testing.  While 2019 novel coronavirus  (SARS-CoV-2) nucleic acids may be present in the submitted sample  additional confirmatory testing may be necessary for epidemiological  and / or clinical management purposes  to differentiate between  SARS-CoV-2 and other Sarbecovirus currently known to infect humans.  If clinically indicated additional testing with an alternate test  methodology [phone]) is advised. The SARS-CoV-2 RNA is generally  detectable in upper and lower respiratory sp ecimens during the acute  phase of infection. The expected result is Negative. Fact Sheet for Patients:  StrictlyIdeas.no Fact Sheet for Healthcare Providers: BankingDealers.co.za This test is not yet approved or cleared by the [place] FDA and has been authorized for detection and/or diagnosis of SARS-CoV-2 by FDA under an Emergency Use Authorization (EUA).  This EUA will remain in effect (meaning this test can be used) for the duration of the COVID-19 declaration under Section 564(b)(1) of the Act, 21 U.S.C. section 360bbb-3(b)(1), unless the authorization is terminated or revoked sooner. Performed at [facility], [place] [address] [place], [place] [address]   MRSA PCR Screening     Status: None   Collection Time: 02/21/19  2:56 PM   Specimen: Nasal Mucosa; Nasopharyngeal  Result Value Ref Range Status   MRSA by PCR NEGATIVE NEGATIVE Final    Comment:        The GeneXpert MRSA Assay (FDA approved for NASAL specimens only), is one component of a comprehensive MRSA colonization surveillance program. It is not intended to diagnose MRSA infection nor to guide or monitor treatment for MRSA infections. Performed at [facility], [place] [address], [place], [place] [address]   Cath Tip Culture     Status: None (Preliminary result)   Collection Time: 02/25/19  2:03 PM   Specimen: Porta Cath; Other  Result Value Ref Range Status   Specimen Description   Final    PORTA CATH RIGHT CHEST Performed at [facility] [address], [place], [place] [address]    Special Requests   Final    NONE Performed at [facility], [place] [address], [place], [place] [address]    Culture   Final    NO GROWTH 2 DAYS Performed at [facility] [address] [place], [place] [address]    Report Status PENDING  Incomplete         Radiology Studies: Ct Head W & Wo Contrast  Result Date: 02/27/2019 CLINICAL DATA:  68 year old female with confusion, lethargy. Supraglottic carcinoma. Staging. EXAM: CT HEAD WITHOUT AND WITH CONTRAST TECHNIQUE: Contiguous axial images were obtained from the base of the skull through the vertex without and with intravenous contrast CONTRAST:  12mL OMNIPAQUE IOHEXOL 300 MG/ML  SOLN COMPARISON:  Neck CT today reported separately. FINDINGS: Brain: Study is mildly degraded by motion artifact despite repeated imaging attempts. Cerebral volume is within normal limits for age. Minimal to mild for age scattered white matter hypodensity. No midline shift, ventriculomegaly, mass effect, evidence of mass lesion, intracranial  hemorrhage or evidence of cortically based acute infarction. No abnormal enhancement identified. Vascular: Calcified atherosclerosis at the skull base. The major intracranial vascular structures are enhancing as expected. Skull: Motion artifact at the skull base. No acute or suspicious osseous lesion identified. Sinuses/Orbits: Minor sphenoid sinus mucosal thickening, otherwise well pneumatized. Other: Negative visible orbit and scalp soft tissues. IMPRESSION: No acute or metastatic intracranial abnormality. Essentially normal for age CT appearance of the brain. Electronically Signed   By: Genevie Ann M.D.   On: 02/27/2019 12:00   Ct Soft Tissue Neck W Contrast  Result Date: 02/27/2019 CLINICAL DATA:  History of head/neck cancer, restaging. Additional history: Restaging of supraglottic cancer, chemotherapy completed 01/29/2019. Confusion, pancytopenia, sepsis. EXAM: CT NECK WITH CONTRAST TECHNIQUE: Multidetector CT imaging of the neck was performed using the standard protocol following the bolus administration of intravenous contrast. CONTRAST:  117mL OMNIPAQUE IOHEXOL 300 MG/ML  SOLN COMPARISON:  NECK CT 10/26/2018. FINDINGS: Pharynx and larynx: Please note the examination is moderately motion degraded, which limits evaluation. Within this limitation, there is a similar appearance of soft tissue prominence at the tongue base/vallecula bilaterally which is partially inseparable from the lingual surface of the epiglottis. Thickening of the left aryepiglottic fold was better appreciated on prior examination, although this may be secondary to motion on the current study. There is generalized soft tissue swelling within the supraglottic larynx which is new from prior exam. Resultant mild to moderate airway narrowing. No obvious abnormality of the true vocal cords. Unchanged asymmetric sclerosis of  the left arytenoid cartilage. Salivary glands: No abnormality identified. Thyroid: Status post thyroidectomy. Lymph nodes:  Bulky heterogeneous right level 2A lymphadenopathy persists but has decreased in size since prior examination, now measuring 3.8 x 3.0 cm (previously 5.0 x 3.3 cm). Previously demonstrated left level 2A lymphadenopathy has significantly decreased, now measuring 3.2 x 2.0 cm in transaxial dimensions (previously 5.4 x 2.7 cm). No new cervical lymphadenopathy is identified. Vascular: Prominent atherosclerotic disease at the right carotid bifurcation. Redemonstrated left carotid stent. As before, right-sided lymphadenopathy partially encases the right internal jugular vein with mild luminal narrowing. Left-sided lymphadenopathy partially encases the proximal left cervical internal carotid artery without significant luminal narrowing. Limited intracranial: Unremarkable Visualized orbits: Unremarkable Mastoids and visualized paranasal sinuses: Small amount of scattered fluid within the bilateral ethmoid and sphenoid sinuses. No significant mastoid effusion. Skeleton: Cervical spondylosis with advanced multilevel disc and facet degeneration. Upper chest: Please refer to concurrent chest CT for description of intrathoracic findings. IMPRESSION: Moderately motion degraded examination, limiting assessment. Soft tissue prominence at the tongue base/vallecula bilaterally has not significantly changed since neck CT 10/26/2018. Findings likely reflect residual tumor, possibly with superimposed treatment related changes. Thickening of the left aryepiglottic fold was better appreciated on prior exam, although this may be related to motion degradation of the current study. Nonspecific generalized soft tissue thickening of the supraglottic larynx may reflect treatment related changes. Consider direct visualization, as warranted. Resultant mild to moderate airway narrowing. Persistent although decreased bilateral level IIA lymphadenopathy likely reflecting nodal metastases. No new cervical lymphadenopathy. Electronically Signed   By:  Kellie Simmering   On: 02/27/2019 12:42   Ct Chest W Contrast  Result Date: 02/27/2019 CLINICAL DATA:  Supraglottic carcinoma. Chemotherapy complete 01/29/2019. EXAM: CT CHEST WITH CONTRAST TECHNIQUE: Multidetector CT imaging of the chest was performed during intravenous contrast administration. CONTRAST:  196mL OMNIPAQUE IOHEXOL 300 MG/ML  SOLN COMPARISON:  None. FINDINGS: Cardiovascular: Coronary artery calcification and aortic atherosclerotic calcification. Mediastinum/Nodes: No axillary or supraclavicular adenopathy. No mediastinal adenopathy. Central venous line with tip in the distal SVC. Lungs/Pleura: bibasilar atelectasis. No pneumonia or aspiration pneumonitis identified. No pulmonary nodularity. Upper Abdomen: No hiatal hernia.  Stomach in expected location. Musculoskeletal: No aggressive osseous lesion. IMPRESSION: 1. No thoracic metastasis. 2. Normal stomach anatomy. 3. Coronary artery calcification and Aortic Atherosclerosis (ICD10-I70.0). Electronically Signed   By: Suzy Bouchard M.D.   On: 02/27/2019 11:13   [place] Ekg Site Rite  Result Date: 02/26/2019 If Site Rite image not attached, placement could not be confirmed due to current cardiac rhythm.       Scheduled Meds: . acetylcysteine  4 mL Nebulization BID  . [START ON 02/28/2019] aspirin  150 mg Rectal Daily  . chlorhexidine  15 mL Mouth Rinse BID  . Chlorhexidine Gluconate Cloth  6 each Topical Daily  . levothyroxine  56 mcg Intravenous Q0600  . mouth rinse  15 mL Mouth Rinse q12n4p  . potassium chloride  40 mEq Oral Once  . sodium chloride (PF)      . sodium chloride flush  10-40 mL Intracatheter Q12H  . sodium chloride flush  3 mL Intravenous Q12H   Continuous Infusions: . dextrose 5 % and 0.45 % NaCl with KCl 40 mEq/L 50 mL/hr at 02/26/19 2116  . penicillin g continuous IV infusion 12 Million Units (02/27/19 0555)     LOS: 6 days    Time spent: 35 minutes.     Hosie Poisson, MD [facility] Pager  [phone]  If 7PM-7AM, please contact  night-coverage [email or web address] Password [facility] 02/27/2019, 4:15 PM    Addendum:  Palliative care was able to get in touch with the patient's cousin who will be the person making medical decisions.  Meanwhile, RN notified that they are unable to get patient's temperature. Get  Blood cultures and VS and possible transfer to step down if pt is hypothermic.    Hosie Poisson, MD [phone]

## 2019-02-27 NOTE — Progress Notes (Signed)
Daily Progress Note   Patient Name: Gloria Murray       Date: 02/27/2019 DOB: 1951/05/15  Age: 68 y.o. MRN#: [id number] Attending Physician: Hosie Poisson, MD Primary Care Physician: Jearld Fenton, NP Admit Date: 02/11/2019  Reason for Consultation/Follow-up: Establishing goals of care  Subjective: Patient remains lethargic.   Evaristo Bury (cousin) returned call. I introduced myself and palliative to family member. [name] confirmed she is the only available and living relative for patient. She confirms awareness of patient's admission and verbalizes she has received updates from [name] (friend).   I discussed her role as patient's main decision maker given her state of confusion and inability to make appropriate decisions. [name] verbalized understanding and states she is willing to stand in proxy for patient at this time. She states she will continue to be in contact with [facility] also for support during this time.  Provided her with detailed updates on patient's current condition and plan of care from all disciplines per notations and discussions. She verbalized understanding. [name] upset in conversation and express she wanted to speak to all medical providers with updates given change in patient's condition and confusion. She is concerned for patient and wants to make sure she has a full understanding before agreeing to any major medical decisions. She states "I do not want her to suffer but I don't want to give up on her either because I don't have a full understanding of how we got here. This is not how she was before she came in to the hospital!" Support given and attempted to continue to provide updates and care plan to treat the treatable. [name] verbalized understanding and appreciation of updates.    I discussed need to place a PICC line by medical team to gain access for IV fluids and other medications. [name] verbalized agreement and understanding. I discussed with her recommendations of PEG tube give her severe malnutrition and poor po intake. [name], states she was under the impression that patient voiced previously she was not interested in a PEG placement however, she is unsure what her decision would be for patient. "If it gives her a chance to live, I would say maybe but I don't want her to be mad at me when she wake up. I will have to just do what I think is best  after I speak to the other doctors!"   She is asking if patient will be able to return home by herself. Advised I was unable to answer that at this time. I discussed potential scenarios depending on patient's condition, improvement, path of care (peg vs. Comfort) etc. We discussed in detail best case and worst case scenarios. She verbalized understanding expressing she remains hopeful sharing she is unable to provide care for patient if she is not able to live independently.   I discussed with her in detail patient's full code status with consideration to her current illness and co-morbidities. [name] verbalized understanding. She is requesting patient remains a full code. She states "I don't like making decisions for anyone but if I have to I am not giving up that easy". She again states she has not spoken to anyone on the medical team and need to hear "all the people involved in her care perspective". I informed her I would be in contact with medical provider and inform them she will be available by listed phone number for updates. She verbalized appreciation.   All questions answered. Family has contact information and advised to call with any further questions or concerns.    Length of Stay: 6  Current Medications: Scheduled Meds:  . acetylcysteine  4 mL Nebulization BID  . aspirin  150 mg Rectal Daily  . chlorhexidine  15 mL  Mouth Rinse BID  . Chlorhexidine Gluconate Cloth  6 each Topical Daily  . levothyroxine  56 mcg Intravenous Q0600  . mouth rinse  15 mL Mouth Rinse q12n4p  . potassium chloride  40 mEq Oral Once  . sodium chloride (PF)      . sodium chloride flush  10-40 mL Intracatheter Q12H  . sodium chloride flush  3 mL Intravenous Q12H    Continuous Infusions: . dextrose 5 % and 0.45 % NaCl with KCl 40 mEq/L 50 mL/hr at 02/26/19 2116  . penicillin g continuous IV infusion 12 Million Units (02/27/19 0555)    PRN Meds: acetaminophen **OR** acetaminophen, albuterol, lidocaine, lidocaine-EPINEPHrine, ondansetron **OR** ondansetron (ZOFRAN) IV, sodium chloride flush  Physical Exam      Lethargic, confused, chronically-ill appearing   Vital Signs: BP 111/76 (BP Location: Right Arm)   Pulse 80   Temp 98 F (36.7 C) (Axillary)   Resp 16   Ht 5\' 6"  (1.676 m)   Wt 77.7 kg   SpO2 100%   BMI 27.65 kg/m  SpO2: SpO2: 100 % O2 Device: O2 Device: Nasal Cannula O2 Flow Rate: O2 Flow Rate (L/min): 3 L/min  Intake/output summary:   Intake/Output Summary (Last 24 hours) at 02/27/2019 1742 Last data filed at 02/27/2019 0450 Gross per 24 hour  Intake 4854.69 ml  Output -  Net 4854.69 ml   LBM: Last BM Date: 02/25/2019 Baseline Weight: Weight: 78 kg Most recent weight: Weight: 77.7 kg       Palliative Assessment/Data:LETHARGIC       Patient Active Problem List   Diagnosis Date Noted  . Encephalopathy   . Bacteremia   . Septic shock ([place]) 02/21/2019  . Hypothermia 02/25/2019  . Pancytopenia ([place]) 02/03/2019  . Hypotension 02/13/2019  . Hypokalemia 02/06/2019  . Oral candidiasis 02/06/2019  . Anemia due to antineoplastic chemotherapy 01/23/2019  . Chemotherapy-induced thrombocytopenia 01/16/2019  . Chemotherapy induced nausea and vomiting 01/02/2019  . Protein malnutrition ([place]) 01/02/2019  . Leukopenia due to antineoplastic chemotherapy ([place]) 12/26/2018  . Port-A-Cath in place 12/19/2018   . Hypomagnesemia 12/19/2018  .  Malignant neoplasm of supraglottis ([place]) 11/16/2018  . Osteoporosis 07/07/2017  . Seasonal allergies 07/07/2017  . Carotid stenosis 10/14/2014  . Hypothyroidism 01/04/2007  . HYPERLIPIDEMIA, MIXED, MILD 01/04/2007  . HYPERTENSION, BENIGN ESSENTIAL 01/04/2007    Palliative Care Assessment & Plan   Patient Profile: Palliative Care consult requested for this 68 y.o. 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 She presented to ED from home after falling. Patient reported on arrival that she was sitting down and suddenly fell to the floor. Her friend, [name] reports she went to check on patient and found her in the floor. UA and COVID negative. Chest x-ray showed right chest wall Port-A-Cath in place without focal consolidation, effusion, or edema. Lab were WBC 2.8, hemoglobin 10.5, platelets 79,000, sodium 146, potassium 2.9, magnesium 2.1, bicarb 31, BUN 44, creatinine 0.85, lactic acid 1.3.Since admission patient continues to have poor po intake. Port-A-Cath was removed due to streptococcus anginosis sepsis. She continues on antibiotics due to suspected aspiration pneumonia. Being followed by Oncology.   Recommendations/Plan:  FULL CODE-as requested by next-of-kin [name])  Continue with current plan of care per medical team  Evaristo Bury (cousin) is requesting updates from attending and Oncologist. Dr. Karleen Hampshire made aware. Secured message sent via Google chat to Carlos Levering., NP (Oncology).   Family requesting full aggressive care. States she is unable to make decisions without updates from all providers.   PMT will continue to support and follow.   Goals of Care and Additional Recommendations:  Limitations on Scope of Treatment: Full Scope Treatment  Code Status:    Code  Status Orders  (From admission, onward)         Start     Ordered   02/21/19 0214  Full code  Continuous     02/21/19 0214        Code Status History    Date Active Date Inactive Code Status Order ID Comments User Context   02/23/2019 2104 02/21/2019 0214 Full Code [id number]  Lenore Cordia, MD ED   10/14/2014 1621 10/15/2014 1504 Full Code [id number]  Serafina Mitchell, MD Inpatient   07/23/2014 1332 07/23/2014 2042 Full Code [id number]  Early, Arvilla Meres, MD Inpatient   Advance Care Planning Activity       Prognosis:  Guarded to Poor   Discharge Planning:  To Be Determined  Care plan was discussed with Maudie Mercury, RN and Dr. Karleen Hampshire.   Thank you for allowing the Palliative Medicine Team to assist in the care of this patient.  Time In: 1630 Time Out: 1735 Total Time: 65 min.   Greater than 50%  of this time was spent counseling and coordinating care related to the above assessment and plan.  Alda Lea, AGPCNP-BC Palliative Medicine Team  Phone: [phone] Amion: Bjorn Pippin    Please contact Palliative Medicine Team phone at [phone] for questions and concerns.

## 2019-02-27 NOTE — Progress Notes (Signed)
Several voicemail left with Vella Kohler [phone]) with hopes of making contact for further goals of care discussion or to transfer medical decision making abilities to patient's long-time friend [name]. Will continue to await return call and also attempt to connect with cousin. This is patient's only available/living relative per [name]. [name] is also attempting to contact Tonie.   Alda Lea, AGPCNP-BC Palliative Medicine Team   NO CHARGE

## 2019-02-27 NOTE — Consult Note (Signed)
..   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 PCCM asked to re-evaluate due to change in mental status accompanied by Apnea. Pt being moved to SD.  History of present illness   ( History obtained from EMR and account from other providers)  68 yo F w/ PMHx Stage IVB (cT2, cN3b, cM0) Laryngeal cancer involving epiglottis completed chemo and radiation- 5 cycles of carboplatin & XRT which was complicated by prolonged cytopenias and poor tolerance , Panyctopenia, Oral candidiasis, Protein malnutrition (declined feeding tube), carotid artery occlusion, heart murmur, hypothyroidism, osteoporosis, hyperlipidemia. Admitted on 7/22 for sepsis. Previously seen by PCCM on 7/23 for management of septic shock. At that time the patient required vasopressors and was also being treated for an aspiration pneumonia.  On 7/24 stress dose steroids and pressors were discontinued and empiric abx were stopped and she was transferred to floor for follow up and a formal swallow.  A feeding tube was recommended which she declined. At change of shift on 7/29 PCCM re-consulted due to change in mental status and apneic episodes. Pt responded to non invasive oxygen by [place] at 3L. She became hypotensive and received 1L NS.  On my evaluation pt has moved to [facility]  she moves upper extremities but is not oriented, occasionally moans. Her BP post 1L IVf and on 30 Neo is MAP >59mmHg Spoke to [facility] her cousin Vella Kohler. We discussed her clinical status and prognosis and possible interventions.     Past Medical History  .Marland Kitchen Active Ambulatory Problems    Diagnosis Date Noted  . Hypothyroidism 01/04/2007  . HYPERLIPIDEMIA, MIXED, MILD 01/04/2007  . HYPERTENSION, BENIGN  ESSENTIAL 01/04/2007  . Carotid stenosis 10/14/2014  . Osteoporosis 07/07/2017  . Seasonal allergies 07/07/2017  . Malignant neoplasm of supraglottis ([place]) 11/16/2018  . Port-A-Cath in place 12/19/2018  . Hypomagnesemia 12/19/2018  . Leukopenia due to antineoplastic chemotherapy ([place]) 12/26/2018  . Chemotherapy induced nausea and vomiting 01/02/2019  . Protein malnutrition ([place]) 01/02/2019  . Chemotherapy-induced thrombocytopenia 01/16/2019  . Anemia due to antineoplastic chemotherapy 01/23/2019  . Hypokalemia 02/06/2019  . Oral candidiasis 02/06/2019   Resolved Ambulatory Problems    Diagnosis Date Noted  . GLUCOSE INTOLERANCE 03/07/2007  . OBESITY 01/04/2007  . NICOTINE ADDICTION 02/27/2007  . LOSS, HEARING NOS 02/27/2007  . Disorder of bone and cartilage 02/27/2007  . EDEMA LEG 02/13/2009  . CAROTID BRUITS, BILATERAL 03/07/2007  . MAMMOGRAM, ABNORMAL, LEFT 01/13/2010  . LIVER FUNCTION TESTS, ABNORMAL 02/27/2007  . Cerumen impaction 04/11/2011  . Occlusion and stenosis of carotid artery without mention of cerebral infarction 07/09/2012  . Routine general medical examination at a health care facility 05/17/2013  . Aftercare following surgery of the circulatory system, [place] 01/24/2014  . Welcome to Medicare preventive visit 05/19/2014  . Hearing loss in left ear 05/19/2014  . Medicare annual wellness visit, initial 06/30/2015  . Well woman exam 07/21/2016  . Cerumen impaction 07/21/2016  . Cancer-related pain 12/12/2018   Past Medical History:  Diagnosis Date  . Allergy   . Carotid artery occlusion   . Heart murmur   . Hyperlipidemia   . Hypertension   . Laryngeal mass   . Osteoporosis   . Thyroid disease   .  Wears glasses   . Wears upper complete and lower partial dentures      Significant Hospital Events   >>Mgmt for septic shock secondary to Streptococcus angionosis  >> Removal of PAC >> surveillance CT scans 02/27/2019 >> RRT 02/27/2019  Consults:   Radiology>> 7/27 Palliative >>7/28 Oncology>>7/26 PCCM>> 7/23>> re-consulted on 7/29  Procedures:  Removal of PAC 7/27  Significant Diagnostic Tests:  Blood cultures>>>GPC  CXR 02/11/2019 no pulm infiltrate  CT Chest w/ contrast 02/27/2019 IMPRESSION: 1. No thoracic metastasis. 2. Normal stomach anatomy. 3. Coronary artery calcification and Aortic Atherosclerosis  CTH w/ and w/o contrast> 02/27/2019 IMPRESSION: No acute or metastatic intracranial abnormality. Essentially normal for age CT appearance of the brain  CT SOFT TISSUE NECK W/ CONTRAST 02/27/2019 IMPRESSION: Moderately motion degraded examination, limiting assessment. Soft tissue prominence at the tongue base/vallecula bilaterally has not significantly changed since neck CT 10/26/2018. Findings likely reflect residual tumor, possibly with superimposed treatment related changes. Thickening of the left aryepiglottic fold was better appreciated on prior exam, although this may be related to motion degradation of the current study. Nonspecific generalized soft tissue thickening of the supraglottic larynx may reflect treatment related changes. Consider direct visualization, as warranted. Resultant mild to moderate airway narrowing. Persistent although decreased bilateral level IIA lymphadenopathy likely reflecting nodal metastases. No new cervical lymphadenopathy.  Micro Data:  Blood cx>> 7/27 NGTD Blood cx repeated on 7/29 by Primary team  Antimicrobials:  Cefepime 02/23/2019 Zosyn 02/21/2019 Vancomycin 02/21/2019    Objective   Blood pressure (!) 66/39, pulse 91, temperature 98 F (36.7 C), temperature source Axillary, resp. rate 18, height 5\' 6"  (1.676 m), weight 77.7 kg, SpO2 98 %.        Intake/Output Summary (Last 24 hours) at 02/27/2019 1954 Last data filed at 02/27/2019 0450 Gross per 24 hour  Intake 4854.69 ml  Output -  Net 4854.69 ml   Filed Weights   02/25/19 0617 02/26/19 0500 02/27/19 0521   Weight: 80.5 kg 76.2 kg 77.7 kg    Examination: General: in no acute distress, not oriented moans/ + Bair hugger HENT: cervical lymphadenopathy, nasal cannula in place Lungs: decreased breath sounds at bases no crackles appreciated Cardiovascular: S1 and S2 appreciated Abdomen: soft non-tender non distended Extremities: full ROM in all, b/l soft mitts Neuro: awake barely opens eyes, moans , not following commands Skin: ecchymoses over dorsum of hands, dry, poor tugor  Resolved Hospital Problem list   Septic shock (off pressors 7/23)  Assessment & Plan:  1. Encephalopathy - change in mental status this evening prompted re-consult -  Pt received Ativan 2mg  at [facility] w/ and wo contrast>>showed no metastatic disease and no ICH - Plts 40K >> Has a h/o Carotid stenosis was on plavix and anticoag but being held due to thrombocytopenia. No focal deficit appreciated Plan: May be hypotension related vs medication induced Continue Neuro checks Avoid benzodiazepines and sedative med As platelet count drops pt is at risk for spontaneous ICH  2.  Hypotension Previous BP at 1300 111/48mmHg pt received Ativan 2mg  1900>> 106/5mmHg Current BP 66/24mmHG Plan: Received 1L NS>> started on Neo gtt only requiring rate 30 Given her protein malnutrition consider Albumin  3. Streptococcus angionosis  Blood cx on 7/27 NGTD Continues on Antibiotics for 2 weeks  No need to escalate at this time PAC was removed by IR on 7/27 w/ no issues  4. Apneic Episodes Pt on 3L O2>>> ABG: 7.455/46.5/160/99 Soft tissue prominence at base of tongue with epiglottic  involvement - high risk airway Family has changed her status to DNI Continue supplemental O2 via [place]  Titrate to keep O2 sats >92% Keep NPO pt is a high aspiration risk HOB >30 deg and Aspiration precautions  5. Stage IV B laryngeal cancer with epiglottic involvement s/p chemorads - completed Rx per Oncology - CT chest w/ contrast, CTH w/ &  w/o contrast and CT soft tissue neck reviewed - no new metastatic processes compared to previous - was started on GCSF due to her progressive leukopenia and neutropenia - Anemia and thrombocytopenia >>supportive transfusions for Hgb <7 recommended  Plt count on admission was 70 now 93.  - remaining mgmt per Oncology  6, Severe Protein Malnutrition Refused feeding tube placement If not in place, I caution against a bedside NGT/coretrac in this pt>>bleeding risk Should be done under direct visualization No PO feeding>> high aspiration risk.  6. Hypomagnesemia and Hypokalemia Last magnesium level was 2.3 on 7/23  Last K= 5.0 on AM labs Resolved continue current plan of care  Best practice:  Diet: NPO Pain/Anxiety/Delirium protocol (if indicated): hold sedative medications at this time VAP protocol (if indicated): not intubated DVT prophylaxis: SCDs due to thrombocytopenia GI prophylaxis: none avoid PPI in this pt may worsen thrombocytopenia Glucose control: not needed at this time Mobility: bedrest Code Status: DNR Family Communication: spoke to Helen Hashimoto (number in EMR) we discussed current clinical condition and her multiple issues. She has decided to make the patient DNR with the goal to make her comfortable. No escalation of care and no CVC placement.  Disposition: Remain in SD under [facility]H.   Labs   CBC: Recent Labs  Lab 02/24/19 0322 02/25/19 0350 02/25/19 0941 02/26/19 0533 02/27/19 0702  WBC 1.4* 4.4 4.3 6.1 1.8*  NEUTROABS  --   --  3.9 5.4 1.6*  HGB 9.2* 9.2* 9.2* 9.6* 8.1*  HCT 28.1* 28.5* 28.7* 30.4* 24.6*  MCV 95.9 95.0 97.0 97.7 94.3  PLT 67* 65* 64* 61* 40*    Basic Metabolic Panel: Recent Labs  Lab 02/21/19 0510  02/22/19 2013 02/23/19 0418 02/24/19 0322 02/25/19 0350 02/26/19 0533  NA 148*   < > 147* 146* 142 140 138  K 3.4*   < > 3.6 3.8 3.8 4.3 5.0  CL 108   < > 109 106 99 96* 99  CO2 23   < > 32 32 35* 35* 29  GLUCOSE 122*   < > 116* 116*  148* 135* 113*  BUN 30*   < > 12 11 9  6* 7*  CREATININE 0.73   < > 0.71 0.61 0.40* 0.44 0.49  CALCIUM 8.6*   < > 8.3* 8.2* 8.2* 8.4* 8.6*  MG 2.1  --   --   --   --   --   --    < > = values in this interval not displayed.   GFR: Estimated Creatinine Clearance: 69.9 mL/min (by C-G formula based on SCr of 0.49 mg/dL). Recent Labs  Lab 02/25/19 0350 02/25/19 0941 02/26/19 0533 02/27/19 0702  WBC 4.4 4.3 6.1 1.8*    Liver Function Tests: Recent Labs  Lab 02/22/19 0351  AST 16  ALT 22  ALKPHOS 36*  BILITOT 1.0  PROT 5.2*  ALBUMIN 3.0*   No results for input(s): LIPASE, AMYLASE in the last 168 hours. No results for input(s): AMMONIA in the last 168 hours.  ABG    Component Value Date/Time   PHART 7.455 (H) 02/27/2019 1920   PCO2ART  46.5 02/27/2019 1920   PO2ART 160 (H) 02/27/2019 1920   HCO3 32.2 (H) 02/27/2019 1920   TCO2 24 10/14/2014 0612   O2SAT 99.1 02/27/2019 1920     Coagulation Profile: Recent Labs  Lab 02/27/19 0702  INR 1.0    Cardiac Enzymes: No results for input(s): CKTOTAL, CKMB, CKMBINDEX, TROPONINI in the last 168 hours.  HbA1C: Hgb A1c MFr Bld  Date/Time Value Ref Range Status  05/19/2014 11:28 AM 6.1 4.6 - 6.5 % Final    Comment:    Glycemic Control Guidelines for People with Diabetes:Non Diabetic:  <6%Goal of Therapy: <7%Additional Action Suggested:  >8%   10/12/2009 09:04 AM 6.1 4.6 - 6.5 % Final    Comment:    See lab report for associated comment(s)    CBG: No results for input(s): GLUCAP in the last 168 hours.  Review of Systems:   .Review of Systems  Unable to perform ROS: Mental status change    Past Medical History  She,  has a past medical history of Allergy, Carotid artery occlusion, Heart murmur, Hyperlipidemia, Hypertension, Hypothyroidism, Laryngeal mass, Osteoporosis, Thyroid disease, Wears glasses, and Wears upper complete and lower partial dentures.   Surgical History    Past Surgical History:  Procedure  Laterality Date  . ARCH AORTOGRAM N/A 07/23/2014   Procedure: ARCH AORTOGRAM;  Surgeon: Rosetta Posner, MD;  Location: [facility] CATH LAB;  Service: Cardiovascular;  Laterality: N/A;  . CAROTID ANGIOGRAM N/A 07/23/2014   Procedure: CAROTID ANGIOGRAM;  Surgeon: Rosetta Posner, MD;  Location: [facility] CATH LAB;  Service: Cardiovascular;  Laterality: N/A;  . CAROTID ENDARTERECTOMY  12/11/2008   left  . CAROTID STENT INSERTION Left 10/14/2014   Procedure: CAROTID STENT INSERTION;  Surgeon: Serafina Mitchell, MD;  Location: [facility] CATH LAB;  Service: Cardiovascular;  Laterality: Left;   carotid  . CARPAL TUNNEL RELEASE     X 2, right wrist  . COLONOSCOPY  Jan. 2, 2015  . DILATION AND CURETTAGE OF UTERUS     X 2  . IR IMAGING GUIDED PORT INSERTION  12/06/2018  . IR REMOVAL TUN ACCESS W/ PORT W/O FL MOD SED  02/25/2019  . laceration hand Right    hand  . LARYNGOSCOPY N/A 11/08/2018   Procedure: DIRECT MICROLARYNGOSCOPY WITH BIOPSY;  Surgeon: Melida Quitter, MD;  Location: [place];  Service: ENT;  Laterality: N/A;  . RIGID ESOPHAGOSCOPY N/A 11/08/2018   Procedure: ESOPHAGOSCOPY;  Surgeon: Melida Quitter, MD;  Location: [place];  Service: ENT;  Laterality: N/A;  . THYROIDECTOMY    . TUBAL LIGATION  1975   bilateral     Social History   reports that she quit smoking about 4 years ago. Her smoking use included cigarettes. She smoked 0.50 packs per day. She has never used smokeless tobacco. She reports that she does not drink alcohol or use drugs.   Family History   Her family history includes Diabetes in her mother. There is no history of Colon cancer.   Allergies Allergies  Allergen Reactions  . Propoxyphene Hcl Itching and Nausea Only    REACTION: nausea and itch  . Propoxyphene N-Acetaminophen Itching and Nausea Only    REACTION: nausea and  itch     Home Medications  Prior to Admission medications   Medication Sig Start Date End Date Taking? Authorizing Provider  Ascorbic Acid (VITAMIN C PO) Take 1 tablet by mouth  daily.   Yes [provider]  aspirin EC 81 MG tablet Take 81 mg  by mouth daily.   Yes [provider]  CALCIUM PO Take 1 tablet by mouth daily.   Yes [provider]  Cholecalciferol (VITAMIN D3 PO) Take 1 tablet by mouth daily.   Yes [provider]  clopidogrel (PLAVIX) 75 MG tablet TAKE 1 TABLET (75 MG TOTAL) BY MOUTH DAILY. 11/02/18  Yes Baity, Coralie Keens, NP  fexofenadine (ALLEGRA) 180 MG tablet Take 180 mg by mouth at bedtime.    Yes [provider]  fluticasone (FLONASE) 50 MCG/ACT nasal spray Place 1 spray into both nostrils daily.    Yes [provider]  Garlic 6384 MG CAPS Take 1 capsule by mouth 2 (two) times daily.     Yes [provider]  hydrochlorothiazide (MICROZIDE) 12.5 MG capsule TAKE 1 CAPSULE EVERY DAY 12/17/18  Yes Jearld Fenton, NP  levothyroxine (SYNTHROID) 112 MCG tablet TAKE 1 TABLET EVERY DAY 12/17/18  Yes Baity, Coralie Keens, NP  lidocaine (XYLOCAINE) 2 % solution Patient: Mix 1part 2% viscous lidocaine, 1part H20. Swish & swallow 36mL of diluted mixture, 90min before meals and at bedtime, up to QID 12/10/18  Yes Eppie Gibson, MD  lidocaine-prilocaine (EMLA) cream Apply to affected area once 01/16/19  Yes Tish Men, MD  metoprolol succinate (TOPROL-XL) 25 MG 24 hr tablet TAKE 1 TABLET EVERY DAY 12/17/18  Yes Baity, Coralie Keens, NP  Omega-3 Fatty Acids (FISH OIL) 1000 MG CAPS Take 1 capsule (1,000 mg total) by mouth daily. 04/11/11  Yes Lucille Passy, MD  ondansetron (ZOFRAN) 8 MG tablet Take 1 tablet (8 mg total) by mouth 2 (two) times daily as needed for refractory nausea / vomiting. Start on day 3 after chemotherapy. 01/23/19  Yes Tish Men, MD  potassium chloride SA (K-DUR) 20 MEQ tablet Take 1 tablet (20 mEq total) by mouth daily. 02/19/19 03/21/19 Yes Tish Men, MD  prochlorperazine (COMPAZINE) 10 MG tablet Take 1 tablet (10 mg total) by mouth every 6 (six) hours as needed (Nausea or vomiting). 01/23/19  Yes Tish Men, MD   simvastatin (ZOCOR) 40 MG tablet TAKE 1 TABLET AT BEDTIME 12/17/18  Yes Baity, Coralie Keens, NP  sodium fluoride (PREVIDENT 5000 PLUS) 1.1 % CREA dental cream Apply cream to tooth brush. Brush teeth for 2 minutes. Spit out excess. DO NOT rinse afterwards. Repeat nightly. 11/22/18  Yes Lenn Cal, DDS  VITAMIN E PO Take 1 capsule by mouth daily.   Yes [provider]  fluconazole (DIFLUCAN) 100 MG tablet Take 2 tabs on the first day, and then 1 tab daily until finished Patient not taking: Reported on 02/01/2019 02/06/19   Tish Men, MD  LORazepam (ATIVAN) 0.5 MG tablet Take 1 tablet (0.5 mg total) by mouth every 6 (six) hours as needed (Nausea or vomiting). Patient not taking: Reported on 02/02/2019 11/28/18   Tish Men, MD  traMADol (ULTRAM) 50 MG tablet Take 1 tablet (50 mg total) by mouth every 8 (eight) hours as needed. Mail to the patient please. Patient has [employer] of $700.00. Patient taking differently: Take 50 mg by mouth every 8 (eight) hours as needed for moderate pain or severe pain. Mail to the patient please. Patient has [employer] of $700.00. 12/19/18   Tish Men, MD     I, Dr Seward Carol have personally reviewed patient's available data, including medical history, events of note, physical examination and test results as part of my evaluation. I have discussed with other care providers such as pharmacist, NP and Elink.  In  addition,  I personally evaluated patient  The patient is critically ill with multiple organ systems failure and requires high complexity decision making for assessment and support, frequent evaluation and titration of therapies, application of advanced monitoring technologies and extensive interpretation of multiple databases. However, the family has decided to change the goal of care to comfort and her resuscitation status to DNR.  Palliative is already consulted. Pt can remain in [place] under [facility] service. I have communicated these details to the overnight  NP K. Government social research officer.   Critical Care Consult Time devoted to patient care services described in this note is 31 Minutes. This time reflects time of care of this signee Dr Seward Carol. This critical care time does not reflect procedure time, or teaching time or supervisory time  but could involve care discussion time   CODE STATUS: DNR DISPOSITION: SD under [facility] PROGNOSIS: Poor   Dr. Seward Carol Pulmonary Critical Care Medicine  02/27/2019 9:31 PM    Critical care time: 55 mins

## 2019-02-27 NOTE — Progress Notes (Signed)
SLP Cancellation Note  Patient Details Name: Gloria Murray MRN: [id number] DOB: Nov 09, 1950   Cancelled treatment:       Reason Eval/Treat Not Completed: Other (comment)(pt given Ativan for imaging study this am, too lethargic for po/eval, will continue efforts)   Macario Golds 02/27/2019, 11:31 AM  Luanna Salk, [place] [facility] SLP [place] Pager [phone] Office [phone]

## 2019-02-28 DIAGNOSIS — Z66 Do not resuscitate: Secondary | ICD-10-CM

## 2019-02-28 LAB — CATH TIP CULTURE: Culture: NO GROWTH

## 2019-02-28 MED ORDER — POLYVINYL ALCOHOL 1.4 % OP SOLN
1.0000 [drp] | Freq: Four times a day (QID) | OPHTHALMIC | Status: DC | PRN
  Filled 2019-02-28: qty 15

## 2019-02-28 MED ORDER — MORPHINE SULFATE (PF) 2 MG/ML IV SOLN
2.0000 mg | INTRAVENOUS | Status: DC | PRN
  Administered 2019-02-28: 2 mg via INTRAVENOUS
  Administered 2019-02-28: 4 mg via INTRAVENOUS
  Administered 2019-02-28: 2 mg via INTRAVENOUS
  Administered 2019-02-28: 4 mg via INTRAVENOUS
  Filled 2019-02-28: qty 2
  Filled 2019-02-28 (×2): qty 1
  Filled 2019-02-28: qty 2

## 2019-02-28 MED ORDER — LORAZEPAM 2 MG/ML PO CONC
1.0000 mg | ORAL | Status: DC | PRN
  Filled 2019-02-28: qty 0.5

## 2019-02-28 MED ORDER — ATROPINE SULFATE 1 % OP SOLN
2.0000 [drp] | Freq: Four times a day (QID) | OPHTHALMIC | Status: DC
  Administered 2019-02-28 (×4): 2 [drp] via SUBLINGUAL
  Filled 2019-02-28: qty 2

## 2019-02-28 MED ORDER — GLYCOPYRROLATE 0.2 MG/ML IJ SOLN
0.3000 mg | INTRAMUSCULAR | Status: DC | PRN
  Administered 2019-02-28: 0.3 mg via INTRAVENOUS
  Filled 2019-02-28: qty 2

## 2019-02-28 MED ORDER — LORAZEPAM 2 MG/ML IJ SOLN
1.0000 mg | INTRAMUSCULAR | Status: DC | PRN
  Administered 2019-02-28 (×3): 1 mg via INTRAVENOUS
  Filled 2019-02-28 (×3): qty 1

## 2019-02-28 MED ORDER — BIOTENE DRY MOUTH MT LIQD: 15.0000 mL | OROMUCOSAL | Status: DC | PRN

## 2019-02-28 NOTE — Progress Notes (Signed)
Daily Progress Note   Patient Name: Gloria Murray       Date: 02/28/2019 DOB: 27-Dec-1950  Age: 68 y.o. MRN#: [id number] Attending Physician: Hosie Poisson, MD Primary Care Physician: Jearld Fenton, NP Admit Date: 02/09/2019  Reason for Consultation/Follow-up: Establishing goals of care  Subjective: Patient remains lethargic and unresponsive. Increased work in breathing noted with audible congestion. Patient seems distress. Tachycardic with HR in 140s.   I spoke with cousin [name]) via phone and provided updates. She is tearful in discussion expressing sadness that "it has come to this". She shares memories of patient and regrets of not being as involved in her life over the years but grateful to be involved during her last few months of life. Support given.   [name], expressed worries regarding patient's financial obligations at her home as well as her hospital stay. Educated her on responsibilities as next of kin and reaching out to the different entities and sharing her situation to seek further guidance in how to proceed. She verbalized understanding and appreciation.   We discussed in detail wishes for patient's care regarding aggressive verus full comfort care for EOL. [name] is tearful and asking for recommendations and "if there was a chance". Discussed in detail patient's poor prognosis and consideration of her quality of life. Family verbalized understanding. She states she does not feel patient would want to be on any form of life-prolonging machines or treatments. She would not want to suffer. [name], shared she was able to use the e-link option to see her on last night and felt that the patient knew she was on the video call. Family tearful and stating "I saw her fidgeting around when I told her  to rest and that I loved her! I think she was trying to tell me she loved me back and she would be ok because when I told her it was ok. She was more relaxed!" Support given.  Educated [name] on what full comfort care would look like. We discussed potential use of medications such as morphine for pain control and better management of shortness of breath, along with other medications for symptoms (excessive secretions, agitation etc.) She verbalized understanding and appreciation.   [name] verbalized wishes to transition patient to full comfort with expectation patient would pass away in the hospital. I offered for  her to come and be at the bedside with patient during this time, however she declined due to her own health complications.   [name] expressed concerns with uncertainty of arrangements once patient passes away with emphasis of not having access to her bank account or unawareness of potential policies. She plans to follow up with patient's friend [name] for support and hopefully further guidance and answers.   All questions answered.    Length of Stay: 7  Current Medications: Scheduled Meds:  . aspirin  150 mg Rectal Daily  . atropine  2 drop Sublingual QID  . chlorhexidine  15 mL Mouth Rinse BID  . Chlorhexidine Gluconate Cloth  6 each Topical Daily  . levothyroxine  56 mcg Intravenous Q0600  . mouth rinse  15 mL Mouth Rinse q12n4p  . potassium chloride  40 mEq Oral Once  . sodium chloride flush  10-40 mL Intracatheter Q12H  . sodium chloride flush  3 mL Intravenous Q12H    Continuous Infusions: . dextrose 5 % and 0.45 % NaCl with KCl 40 mEq/L 50 mL/hr at 02/28/19 1200  . penicillin g continuous IV infusion Stopped (02/28/19 0959)  . phenylephrine (NEO-SYNEPHRINE) Adult infusion Stopped (02/28/19 0802)  . sodium chloride      PRN Meds: acetaminophen **OR** acetaminophen, albuterol, lidocaine, lidocaine-EPINEPHrine, ondansetron **OR** ondansetron (ZOFRAN) IV, sodium chloride,  sodium chloride flush  Physical Exam      Lethargic, chronically-ill appearing, respiratory distress noted Coarse lung sounds, diminished bases, audible secretions Tachycardia (140's) Unable to assess mentation, unresponsive    Vital Signs: BP 119/62   Pulse (!) 130   Temp 97.8 F (36.6 C) (Axillary)   Resp (!) 23   Ht 5\' 6"  (1.676 m)   Wt 78.8 kg   SpO2 100%   BMI 28.04 kg/m  SpO2: SpO2: 100 % O2 Device: O2 Device: Room Air O2 Flow Rate: O2 Flow Rate (L/min): 2 L/min  Intake/output summary:   Intake/Output Summary (Last 24 hours) at 02/28/2019 1342 Last data filed at 02/28/2019 1200 Gross per 24 hour  Intake 3374.45 ml  Output 2400 ml  Net 974.45 ml   LBM: Last BM Date: 02/05/2019 Baseline Weight: Weight: 78 kg Most recent weight: Weight: 78.8 kg       Palliative Assessment/Data:PPS 10%       Patient Active Problem List   Diagnosis Date Noted  . Encephalopathy   . Bacteremia   . Septic shock ([place]) 02/21/2019  . Hypothermia 02/09/2019  . Pancytopenia ([place]) 02/05/2019  . Hypotension 02/09/2019  . Hypokalemia 02/06/2019  . Oral candidiasis 02/06/2019  . Anemia due to antineoplastic chemotherapy 01/23/2019  . Chemotherapy-induced thrombocytopenia 01/16/2019  . Chemotherapy induced nausea and vomiting 01/02/2019  . Protein malnutrition ([place]) 01/02/2019  . Leukopenia due to antineoplastic chemotherapy ([place]) 12/26/2018  . Port-A-Cath in place 12/19/2018  . Hypomagnesemia 12/19/2018  . Malignant neoplasm of supraglottis ([place]) 11/16/2018  . Osteoporosis 07/07/2017  . Seasonal allergies 07/07/2017  . Carotid stenosis 10/14/2014  . Hypothyroidism 01/04/2007  . HYPERLIPIDEMIA, MIXED, MILD 01/04/2007  . HYPERTENSION, BENIGN ESSENTIAL 01/04/2007    Palliative Care Assessment & Plan   Patient Profile: Palliative Care consult requested for this 68 y.o. 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 She presented to ED from home after falling. Patient reported on arrival that she was sitting down and suddenly fell to the floor. Her  friend, [name] reports she went to check on patient and found her in the floor. UA and COVID negative. Chest x-ray showed right chest wall Port-A-Cath in place without focal consolidation, effusion, or edema. Lab were WBC 2.8, hemoglobin 10.5, platelets 79,000, sodium 146, potassium 2.9, magnesium 2.1, bicarb 31, BUN 44, creatinine 0.85, lactic acid 1.3.Since admission patient continues to have poor po intake. Port-A-Cath was removed due to streptococcus anginosis sepsis. She continues on antibiotics due to suspected aspiration pneumonia. Being followed by Oncology.   Recommendations/Plan:  DNR/DNI-as confirmed by [name] (cousin)  Full [place] aware to anticipate hospital death. She is communicating with patient's friend [name]. Offered visitation however, [name] is unable to visit due to health issues. [name] should be approved for visitation if requested.   Will d/c orders not comfort focused.   Morphine PRN for pain/air hunger (may consider continuous drip if symptoms remain uncontrolled)  Robinul PRN for excessive secretions  Ativan PRN for agitation/anxiety  Liquifilm tears PRN for dry eyes  PMT will continue to support and follow as needed.   Goals of Care and Additional Recommendations:  Limitations on Scope of Treatment: Full Comfort Care  Code Status:    Code Status Orders  (From admission, onward)         Start     Ordered   02/21/19 0214  Full code  Continuous     02/21/19 0214        Code Status History    Date Active Date Inactive Code Status Order ID Comments User Context   02/02/2019 2104 02/21/2019 0214 Full Code [id number]  Lenore Cordia, MD ED   10/14/2014 1621 10/15/2014 1504 Full Code  [id number]  Serafina Mitchell, MD Inpatient   07/23/2014 1332 07/23/2014 2042 Full Code [id number]  Rosetta Posner, MD Inpatient   Advance Care Planning Activity     Prognosis:  Hours-days in the setting of full comfort care.   Discharge Planning:  Anticipated Hospital Death  Care plan was discussed with [name] (cousin), RN, and Dr. Karleen Hampshire.    Thank you for allowing the Palliative Medicine Team to assist in the care of this patient.  Time In: 1300 Time Out: 1405 Total Time: 65 min.   Greater than 50%  of this time was spent counseling and coordinating care related to the above assessment and plan.  Alda Lea, AGPCNP-BC Palliative Medicine Team  Phone: [phone] Amion: Bjorn Pippin    Please contact Palliative Medicine Team phone at [phone] for questions and concerns.

## 2019-02-28 NOTE — Progress Notes (Signed)
Patient off vasopressors.  DNR/DNI.  PCCM will be available PRN.  Please call back if new needs arise.  Reviewed with primary MD.    Noe Gens, NP-C [facility] Pgr: [phone] or if no answer [phone] 02/28/2019, 10:04 AM

## 2019-02-28 NOTE — Progress Notes (Addendum)
Shift event note: Notified by my colleague Dr Karleen Hampshire just after shift change regarding change in pt's status requiring transfer to SDU for persistent hypotension (maps in the 40's), hypothermia and decreased LOC. Was ask to see pt pending PCCM consult. Arrived at bedside at approx 1945. Pt noted very lethargic and not verbally responsive. She will open eyes but does not appear to track and does not follow commands. Occasionally moans out when stimulated. Airway intact. Appears to MAE x 4 and and attempts to grab out randomly when staff working w/ her. RN applied mittens. Skin is pale, cool and dry. BP MAP's remain in the 40's. HR in the 80's w/ RRR. 02 sats 98-100% on 2L [place]. BBS very diminished w/o crackles or wheezes. Abd is non-distended w/ + bs and no apparent TTP. First rectal temp in SDU 92. Bair hugger blanket applied. Of note pt received IV Ativan 2 mg at approx 1300 this afternoon. Assessment/Plan: 1. Hypotension: Refractory to IVF's. Etiology unclear, previously normotensive. Possibly related to sedating medication vs sepsis. Discussed pt w/ Dr Emmit Alexanders w/ [facility]. Pt is to be evaluated by [facility] provider tonight. Will start Neo drip to keep MAPs > 65. Has already rec'd 1.5 L IVF's.  2. Encephalopathy: Ct head w/o cm unremarkable for acute process. ? Ativan vs hypotension.  Monitor closely as BP and temp normalize.  3. Hypothermia: Again concerning for sepsis. Continue warming blanket until temp WNL. Per recommendation by Dr Oletta Darter will broaden antibiotics to Vanc and Cefipime. Repeat cutures. Dr Karleen Hampshire spoke w/ pt's cousin who has agreed to be pt's decision maker (see note) and wishes for pt to remain full code and receive all treatments that may help. Will continue to monitor closely in SDU pending PCCM consult. Appreciate PCCM input.   Jeryl Columbia, NP-C [facility] Pager [phone]  CRITICAL CARE Performed by: Jeryl Columbia   Total critical care time: 60 minutes  Critical care  time was exclusive of separately billable procedures and treating other patients.  Critical care was necessary to treat or prevent imminent or life-threatening deterioration.  Critical care was time spent personally by me on the following activities: development of treatment plan with patient and/or surrogate as well as nursing, discussions with consultants, evaluation of patient's response to treatment, examination of patient, obtaining history from patient or surrogate, ordering and performing treatments and interventions, ordering and review of laboratory studies, ordering and review of radiographic studies, pulse oximetry and re-evaluation of patient's condition.

## 2019-02-28 NOTE — Progress Notes (Signed)
HEMATOLOGY-ONCOLOGY PROGRESS NOTE  SUBJECTIVE: Events overnight noted.  Was transferred to the ICU and initially started on pressors.  CODE STATUS has been changed to DNR/DNI.  Pressors have been stopped this morning.  The patient remains sedated.  Unable to answer any questions.  Oncology History  Malignant neoplasm of supraglottis ([place])  10/26/2018 Imaging   CT neck w/ contrast: IMPRESSION: 1. Bulky, partially necrotic bilateral cervical lymphadenopathy most concerning for metastatic disease, less likely unusual infection. 2. Prominent soft tissue at the tongue base with asymmetric left aryepiglottic fold thickening and slight fullness of the left palatine tonsil. Direct visualization is recommended to assess for a primary mucosal malignancy. 3.  Aortic Atherosclerosis (ICD10-I70.0).   11/08/2018 Procedure   DL with biopsy   11/08/2018 Pathology Results   Accession: [id number] 4. Larynx, biopsy, Left Supraglottis - INVASIVE SQUAMOUS CELL CARCINOMA. 2. Larynx, biopsy, Left Ary Epiglottic Fold - INVASIVE SQUAMOUS CELL CARCINOMA. 3. Tongue, biopsy, Left Vallecular Base - INVASIVE SQUAMOUS CELL CARCINOMA. 4. Larynx, biopsy, Right Vallecular Epiglottis - INVASIVE SQUAMOUS CELL CARCINOMA.   11/16/2018 Initial Diagnosis   Laryngeal cancer ([place])   11/23/2018 Imaging   PET: IMPRESSION: 1. Intensely hypermetabolic supraglottic mass predominantly involving the LEFT aryepiglottic fold but extending across midline anteriorly. Inferiorly hypermetabolic activity extends to the level of the larynx on the LEFT. 2. Bilateral bulky intensely hypermetabolic metastatic level II lymph nodes. Metastatic adenopathy extends with smaller nodes in the level III nodal stations bilaterally. 3. No evidence of thoracic metastasis. 4. Enlarged LEFT adrenal gland consistent with hyperplasia or lipid poor adenoma.   11/27/2018 Cancer Staging   Staging form: Larynx - Supraglottis, AJCC 8th Edition -  Clinical stage from 11/27/2018: Stage IVB (cT2, cN3b, cM0) - Signed by Eppie Gibson, MD on 11/27/2018   12/13/2018 - 01/29/2019 Chemotherapy   The patient had palonosetron (ALOXI) injection 0.25 mg, 0.25 mg, Intravenous,  Once, 5 of 7 cycles Administration: 0.25 mg (12/13/2018), 0.25 mg (12/20/2018), 0.25 mg (12/27/2018), 0.25 mg (01/03/2019), 0.25 mg (01/10/2019) CARBOplatin (PARAPLATIN) 210 mg in sodium chloride 0.9 % 250 mL chemo infusion, 210 mg (100 % of original dose 213.8 mg), Intravenous,  Once, 5 of 7 cycles Dose modification:   (original dose 213.8 mg, Cycle 1) Administration: 210 mg (12/13/2018), 210 mg (12/20/2018), 210 mg (12/27/2018), 210 mg (01/03/2019), 210 mg (01/10/2019)  for chemotherapy treatment.       REVIEW OF SYSTEMS:   Unable to obtain secondary to patient's sedation.  I have reviewed the past medical history, past surgical history, social history and family history with the patient and they are unchanged from previous note.   PHYSICAL EXAMINATION: ECOG PERFORMANCE STATUS: 3 - Symptomatic, >50% confined to bed  Vitals:   02/28/19 1015 02/28/19 1030  BP: 117/74 123/65  Pulse:    Resp: 20 20  Temp:    SpO2: 100% 100%   Filed Weights   02/27/19 0521 02/27/19 2010 02/28/19 0355  Weight: 171 lb 4.8 oz (77.7 kg) 171 lb 11.8 oz (77.9 kg) 173 lb 11.6 oz (78.8 kg)    Intake/Output from previous day: 07/29 0701 - 07/30 0700 In: 3028.7 [I.V.:1557.1; IV Piggyback:1471.6] Out: 1500 [Urine:1500]  GENERAL: Chronically ill-appearing female, resting quietly, no distress SKIN: skin color, texture, turgor are normal, no rashes or significant lesions EYES: normal, Conjunctiva are pink and non-injected, sclera clear OROPHARYNX:no exudate, no erythema and lips, buccal mucosa, and tongue normal  NECK: Palpable adenopathy, right greater than left LUNGS: Coarse rhonchi in the anterior lung fields HEART: regular rate &  rhythm and no murmurs and no lower extremity edema ABDOMEN:abdomen  soft, non-tender and normal bowel sounds Musculoskeletal:no cyanosis of digits and no clubbing  NEURO: alert & oriented x 3 with fluent speech, no focal motor/sensory deficits  LABORATORY DATA:  I have reviewed the data as listed CMP Latest Ref Rng & Units 02/26/2019 02/25/2019 02/24/2019  Glucose 70 - 99 mg/dL 113(H) 135(H) 148(H)  BUN 8 - 23 mg/dL 7(L) 6(L) 9  Creatinine 0.44 - 1.00 mg/dL 0.49 0.44 0.40(L)  Sodium 135 - 145 mmol/L 138 140 142  Potassium 3.5 - 5.1 mmol/L 5.0 4.3 3.8  Chloride 98 - 111 mmol/L 99 96(L) 99  CO2 22 - 32 mmol/L 29 35(H) 35(H)  Calcium 8.9 - 10.3 mg/dL 8.6(L) 8.4(L) 8.2(L)  Total Protein 6.5 - 8.1 g/dL - - -  Total Bilirubin 0.3 - 1.2 mg/dL - - -  Alkaline Phos 38 - 126 U/L - - -  AST 15 - 41 U/L - - -  ALT 0 - 44 U/L - - -    Lab Results  Component Value Date   WBC 1.8 (L) 02/27/2019   HGB 8.1 (L) 02/27/2019   HCT 24.6 (L) 02/27/2019   MCV 94.3 02/27/2019   PLT 40 (L) 02/27/2019   NEUTROABS 1.6 (L) 02/27/2019    Ct Head W & Wo Contrast  Result Date: 02/27/2019 CLINICAL DATA:  68 year old female with confusion, lethargy. Supraglottic carcinoma. Staging. EXAM: CT HEAD WITHOUT AND WITH CONTRAST TECHNIQUE: Contiguous axial images were obtained from the base of the skull through the vertex without and with intravenous contrast CONTRAST:  133mL OMNIPAQUE IOHEXOL 300 MG/ML  SOLN COMPARISON:  Neck CT today reported separately. FINDINGS: Brain: Study is mildly degraded by motion artifact despite repeated imaging attempts. Cerebral volume is within normal limits for age. Minimal to mild for age scattered white matter hypodensity. No midline shift, ventriculomegaly, mass effect, evidence of mass lesion, intracranial hemorrhage or evidence of cortically based acute infarction. No abnormal enhancement identified. Vascular: Calcified atherosclerosis at the skull base. The major intracranial vascular structures are enhancing as expected. Skull: Motion artifact at the  skull base. No acute or suspicious osseous lesion identified. Sinuses/Orbits: Minor sphenoid sinus mucosal thickening, otherwise well pneumatized. Other: Negative visible orbit and scalp soft tissues. IMPRESSION: No acute or metastatic intracranial abnormality. Essentially normal for age CT appearance of the brain. Electronically Signed   By: Genevie Ann M.D.   On: 02/27/2019 12:00   Ct Soft Tissue Neck W Contrast  Result Date: 02/27/2019 CLINICAL DATA:  History of head/neck cancer, restaging. Additional history: Restaging of supraglottic cancer, chemotherapy completed 01/29/2019. Confusion, pancytopenia, sepsis. EXAM: CT NECK WITH CONTRAST TECHNIQUE: Multidetector CT imaging of the neck was performed using the standard protocol following the bolus administration of intravenous contrast. CONTRAST:  136mL OMNIPAQUE IOHEXOL 300 MG/ML  SOLN COMPARISON:  NECK CT 10/26/2018. FINDINGS: Pharynx and larynx: Please note the examination is moderately motion degraded, which limits evaluation. Within this limitation, there is a similar appearance of soft tissue prominence at the tongue base/vallecula bilaterally which is partially inseparable from the lingual surface of the epiglottis. Thickening of the left aryepiglottic fold was better appreciated on prior examination, although this may be secondary to motion on the current study. There is generalized soft tissue swelling within the supraglottic larynx which is new from prior exam. Resultant mild to moderate airway narrowing. No obvious abnormality of the true vocal cords. Unchanged asymmetric sclerosis of the left arytenoid cartilage. Salivary glands: No abnormality identified.  Thyroid: Status post thyroidectomy. Lymph nodes: Bulky heterogeneous right level 2A lymphadenopathy persists but has decreased in size since prior examination, now measuring 3.8 x 3.0 cm (previously 5.0 x 3.3 cm). Previously demonstrated left level 2A lymphadenopathy has significantly decreased, now  measuring 3.2 x 2.0 cm in transaxial dimensions (previously 5.4 x 2.7 cm). No new cervical lymphadenopathy is identified. Vascular: Prominent atherosclerotic disease at the right carotid bifurcation. Redemonstrated left carotid stent. As before, right-sided lymphadenopathy partially encases the right internal jugular vein with mild luminal narrowing. Left-sided lymphadenopathy partially encases the proximal left cervical internal carotid artery without significant luminal narrowing. Limited intracranial: Unremarkable Visualized orbits: Unremarkable Mastoids and visualized paranasal sinuses: Small amount of scattered fluid within the bilateral ethmoid and sphenoid sinuses. No significant mastoid effusion. Skeleton: Cervical spondylosis with advanced multilevel disc and facet degeneration. Upper chest: Please refer to concurrent chest CT for description of intrathoracic findings. IMPRESSION: Moderately motion degraded examination, limiting assessment. Soft tissue prominence at the tongue base/vallecula bilaterally has not significantly changed since neck CT 10/26/2018. Findings likely reflect residual tumor, possibly with superimposed treatment related changes. Thickening of the left aryepiglottic fold was better appreciated on prior exam, although this may be related to motion degradation of the current study. Nonspecific generalized soft tissue thickening of the supraglottic larynx may reflect treatment related changes. Consider direct visualization, as warranted. Resultant mild to moderate airway narrowing. Persistent although decreased bilateral level IIA lymphadenopathy likely reflecting nodal metastases. No new cervical lymphadenopathy. Electronically Signed   By: Kellie Simmering   On: 02/27/2019 12:42   Ct Chest W Contrast  Result Date: 02/27/2019 CLINICAL DATA:  Supraglottic carcinoma. Chemotherapy complete 01/29/2019. EXAM: CT CHEST WITH CONTRAST TECHNIQUE: Multidetector CT imaging of the chest was performed  during intravenous contrast administration. CONTRAST:  1104mL OMNIPAQUE IOHEXOL 300 MG/ML  SOLN COMPARISON:  None. FINDINGS: Cardiovascular: Coronary artery calcification and aortic atherosclerotic calcification. Mediastinum/Nodes: No axillary or supraclavicular adenopathy. No mediastinal adenopathy. Central venous line with tip in the distal SVC. Lungs/Pleura: bibasilar atelectasis. No pneumonia or aspiration pneumonitis identified. No pulmonary nodularity. Upper Abdomen: No hiatal hernia.  Stomach in expected location. Musculoskeletal: No aggressive osseous lesion. IMPRESSION: 1. No thoracic metastasis. 2. Normal stomach anatomy. 3. Coronary artery calcification and Aortic Atherosclerosis (ICD10-I70.0). Electronically Signed   By: Suzy Bouchard M.D.   On: 02/27/2019 11:13   Ir Removal [employer] W/o Fl Mod Sed  Result Date: 02/25/2019 CLINICAL DATA:  History of head neck cancer, now admitted with bacteremia and concern for superimposed port infection. Port a catheter was placed by Dr. Laurence Ferrari on 12/06/2018 and had previously functioned well. EXAM: REMOVAL OF IMPLANTED TUNNELED PORT-A-CATH MEDICATIONS: Patient is currently admitted to the hospital receiving intravenous antibiotics. ANESTHESIA/SEDATION: None FLUOROSCOPY TIME:  None PROCEDURE: Informed written consent was obtained from the patient after a discussion of the risk, benefits and alternatives to the procedure. The patient was positioned supine on the fluoroscopy table and the right chest Port-A-Cath site was prepped with chlorhexidine. A sterile gown and gloves were worn during the procedure. Local anesthesia was provided with 1% lidocaine with epinephrine. A timeout was performed prior to the initiation of the procedure. An incision was made overlying the Port-A-Cath with a #15 scalpel. Utilizing sharp and blunt dissection, the Port-A-Cath was removed completely. Port a Catheter was set aside for potential culture. The pocked was  irrigated with sterile saline. Wound closure was performed with subcutaneous 2-0 Vicryl, subcuticular 4-0 Vicryl, Dermabond and Steri-Strips. A dressing was placed. The patient  tolerated the procedure well without immediate post procedural complication. FINDINGS: Successful removal of implant Port-A-Cath without immediate post procedural complication. IMPRESSION: Successful removal of implanted Port-A-Cath. Electronically Signed   By: Sandi Mariscal M.D.   On: 02/25/2019 14:48   Dg Chest Port 1 View  Result Date: 02/22/2019 CLINICAL DATA:  Pneumonia EXAM: PORTABLE CHEST 1 VIEW COMPARISON:  02/01/2019 FINDINGS: Mild bibasilar airspace disease has developed since the prior study. Negative for heart failure or effusion. Heart size normal. Port-A-Cath tip at the cavoatrial junction unchanged. IMPRESSION: Interval development of mild bibasilar atelectasis/infiltrate. Possible pneumonia. Electronically Signed   By: Franchot Gallo M.D.   On: 02/22/2019 08:03   Dg Chest Port 1 View  Result Date: 02/09/2019 CLINICAL DATA:  Weakness.  Fall. EXAM: PORTABLE CHEST 1 VIEW COMPARISON:  PET-CT dated November 23, 2018. Chest x-ray dated Dec 10, 2008. FINDINGS: Right chest wall port catheter with tip at the cavoatrial junction. The heart size and mediastinal contours are within normal limits. Atherosclerotic calcification of the aortic arch. Normal pulmonary vascularity. No focal consolidation, pleural effusion, or pneumothorax. No acute osseous abnormality. Left internal carotid artery stent. IMPRESSION: No active disease. Electronically Signed   By: Titus Dubin M.D.   On: 02/06/2019 17:43   Dg Swallowing Func-speech Pathology  Result Date: 02/22/2019 Objective Swallowing Evaluation: Type of Study: MBS-Modified Barium Swallow Study  Patient Details Name: Gloria Murray MRN: [id number] Date of Birth: [birth year] Today's Date: 02/22/2019 Time: SLP Start Time (ACUTE ONLY): 1415 -SLP Stop Time (ACUTE ONLY): 1450 SLP Time Calculation  (min) (ACUTE ONLY): 35 min Past Medical History: Past Medical History: Diagnosis Date . Allergy  . Carotid artery occlusion  . Heart murmur  . Hyperlipidemia  . Hypertension  . Hypothyroidism  . Laryngeal mass  . Osteoporosis  . Thyroid disease  . Wears glasses  . Wears upper complete and lower partial dentures  Past Surgical History: Past Surgical History: Procedure Laterality Date . ARCH AORTOGRAM N/A 07/23/2014  Procedure: ARCH AORTOGRAM;  Surgeon: Rosetta Posner, MD;  Location: [facility] CATH LAB;  Service: Cardiovascular;  Laterality: N/A; . CAROTID ANGIOGRAM N/A 07/23/2014  Procedure: CAROTID ANGIOGRAM;  Surgeon: Rosetta Posner, MD;  Location: [facility] CATH LAB;  Service: Cardiovascular;  Laterality: N/A; . CAROTID ENDARTERECTOMY  12/11/2008  left . CAROTID STENT INSERTION Left 10/14/2014  Procedure: CAROTID STENT INSERTION;  Surgeon: Serafina Mitchell, MD;  Location: [facility] CATH LAB;  Service: Cardiovascular;  Laterality: Left;   carotid . CARPAL TUNNEL RELEASE    X 2, right wrist . COLONOSCOPY  Jan. 2, 2015 . DILATION AND CURETTAGE OF UTERUS    X 2 . IR IMAGING GUIDED PORT INSERTION  12/06/2018 . laceration hand Right   hand . LARYNGOSCOPY N/A 11/08/2018  Procedure: DIRECT MICROLARYNGOSCOPY WITH BIOPSY;  Surgeon: Melida Quitter, MD;  Location: [place];  Service: ENT;  Laterality: N/A; . RIGID ESOPHAGOSCOPY N/A 11/08/2018  Procedure: ESOPHAGOSCOPY;  Surgeon: Melida Quitter, MD;  Location: [place];  Service: ENT;  Laterality: N/A; . THYROIDECTOMY   . TUBAL LIGATION  1975  bilateral HPI: 68 yo female with treatment for Laryngeal cancer, involving epiglottis currently undergoing chemo and radiation therapy, carotid artery occlusion, heart murmur, hypothyroidism, osteoporosis, hyperlipidemia.  Pt CXR 02/22/2019 showed Interval development of mild bibasilar atelectasis/infiltrate.  Pt had n/v prior to admission ? source of pna but with head and neck cancer, pharyngeal dysphagia may contribute.  Subjective: pt awake in bed Assessment / Plan /  Recommendation CHL IP CLINICAL IMPRESSIONS 02/22/2019 Clinical  Impression Patient presents with severe oropharyngeal dysphagia due to XRT, edema and decreased motility.  She is aspirating secretions and barium intake *largely silently* and unable to clear aspirates or with cued cough/expectoration.  Delayed oral transiting noted resulting in premature spillage of barium into pharynx.  Swallow is weak and pharyngeal trigger is delayed resulting in laryngeal penetration and aspiration and significant residuals. Various postures including chin tuck with and without head turn left *side with edema* and neck extension did not improve airway protection nor pharyngeal clearance.  At this time, swallow dysfunction does not allow pt to consume po efficiently or without aspiration.  Educated her to findings using teach back and live feed.   Will follow up for swallowing exercises and indication for repeat instrumental swallow evaluation.   Hopeful for continued swallow function with decreased edema and healing from XRT.  Recommend pt be able to consume water and ice chips freely after oral care to decrease disuse muscle atrophy.        SLP Visit Diagnosis Dysphagia, oropharyngeal phase (R13.12) Attention and concentration deficit following -- Frontal lobe and executive function deficit following -- Impact on safety and function --   CHL IP TREATMENT RECOMMENDATION 02/22/2019 Treatment Recommendations Therapy as outlined in treatment plan below   Prognosis 02/22/2019 Prognosis for Safe Diet Advancement Guarded Barriers to Reach Goals Severity of deficits Barriers/Prognosis Comment -- CHL IP DIET RECOMMENDATION 02/22/2019 SLP Diet Recommendations NPO;Free water protocol after oral care Liquid Administration via -- Medication Administration Via alternative means Compensations -- Postural Changes --   CHL IP OTHER RECOMMENDATIONS 02/22/2019 Recommended Consults -- Oral Care Recommendations Oral care QID Other Recommendations Have oral  suction available   CHL IP FOLLOW UP RECOMMENDATIONS 02/22/2019 Follow up Recommendations Home health SLP   CHL IP FREQUENCY AND DURATION 02/22/2019 Speech Therapy Frequency (ACUTE ONLY) min 2x/week Treatment Duration 2 weeks      CHL IP ORAL PHASE 02/22/2019 Oral Phase Impaired Oral - Pudding Teaspoon -- Oral - Pudding Cup -- Oral - Honey Teaspoon -- Oral - Honey Cup -- Oral - Nectar Teaspoon Delayed oral transit;Reduced posterior propulsion;Premature spillage;Decreased bolus cohesion;Weak lingual manipulation Oral - Nectar Cup Delayed oral transit;Reduced posterior propulsion;Premature spillage;Decreased bolus cohesion;Weak lingual manipulation Oral - Nectar Straw -- Oral - Thin Teaspoon Delayed oral transit;Reduced posterior propulsion;Premature spillage;Decreased bolus cohesion;Weak lingual manipulation Oral - Thin Cup Delayed oral transit;Reduced posterior propulsion;Premature spillage;Decreased bolus cohesion;Weak lingual manipulation Oral - Thin Straw Reduced posterior propulsion;Delayed oral transit;Premature spillage;Decreased bolus cohesion;Weak lingual manipulation Oral - Puree Delayed oral transit;Reduced posterior propulsion;Premature spillage;Decreased bolus cohesion;Weak lingual manipulation Oral - Mech Soft -- Oral - Regular -- Oral - Multi-Consistency -- Oral - Pill -- Oral Phase - Comment --  CHL IP PHARYNGEAL PHASE 02/22/2019 Pharyngeal Phase Impaired Pharyngeal- Pudding Teaspoon -- Pharyngeal -- Pharyngeal- Pudding Cup -- Pharyngeal -- Pharyngeal- Honey Teaspoon -- Pharyngeal -- Pharyngeal- Honey Cup -- Pharyngeal -- Pharyngeal- Nectar Teaspoon -- Pharyngeal -- Pharyngeal- Nectar Cup Reduced pharyngeal peristalsis;Reduced epiglottic inversion;Reduced anterior laryngeal mobility;Reduced laryngeal elevation;Reduced airway/laryngeal closure;Reduced tongue base retraction Pharyngeal Material enters airway, passes BELOW cords without attempt by patient to eject out (silent aspiration) Pharyngeal- Nectar  Straw -- Pharyngeal -- Pharyngeal- Thin Teaspoon Reduced pharyngeal peristalsis;Reduced epiglottic inversion;Reduced laryngeal elevation;Reduced airway/laryngeal closure;Reduced tongue base retraction;Pharyngeal residue - valleculae;Pharyngeal residue - pyriform;Penetration/Aspiration during swallow Pharyngeal Material enters airway, passes BELOW cords without attempt by patient to eject out (silent aspiration) Pharyngeal- Thin Cup Reduced pharyngeal peristalsis;Pharyngeal residue - valleculae;Reduced laryngeal elevation;Pharyngeal residue - pyriform;Reduced airway/laryngeal closure;Reduced tongue base  retraction;Reduced epiglottic inversion;Penetration/Aspiration during swallow;Moderate aspiration Pharyngeal Material enters airway, passes BELOW cords without attempt by patient to eject out (silent aspiration) Pharyngeal- Thin Straw Reduced pharyngeal peristalsis;Pharyngeal residue - pyriform;Pharyngeal residue - valleculae;Moderate aspiration;Reduced epiglottic inversion;Reduced anterior laryngeal mobility;Reduced laryngeal elevation;Compensatory strategies attempted (with notebox);Reduced airway/laryngeal closure;Penetration/Apiration after swallow;Penetration/Aspiration during swallow Pharyngeal Material enters airway, passes BELOW cords without attempt by patient to eject out (silent aspiration) Pharyngeal- Puree Reduced epiglottic inversion;Reduced tongue base retraction;Reduced laryngeal elevation;Reduced anterior laryngeal mobility;Reduced pharyngeal peristalsis;Pharyngeal residue - valleculae Pharyngeal -- Pharyngeal- Mechanical Soft -- Pharyngeal -- Pharyngeal- Regular -- Pharyngeal -- Pharyngeal- Multi-consistency -- Pharyngeal -- Pharyngeal- Pill -- Pharyngeal -- Pharyngeal Comment chin tuck not helpful to prevent aspiration, neck extension  CHL IP CERVICAL ESOPHAGEAL PHASE 02/22/2019 Cervical Esophageal Phase WFL Pudding Teaspoon -- Pudding Cup -- Honey Teaspoon -- Honey Cup -- Nectar Teaspoon -- Nectar  Cup -- Nectar Straw -- Thin Teaspoon -- Thin Cup -- Thin Straw -- Puree -- Mechanical Soft -- Regular -- Multi-consistency -- Pill -- Cervical Esophageal Comment -- Gloria Murray 02/22/2019, 5:01 PM    Luanna Salk, MS [facility] SLP Acute Rehab Services Pager [phone] Office [phone]           [place] Ekg Site Rite  Result Date: 02/26/2019 If Site Rite image not attached, placement could not be confirmed due to current cardiac rhythm.   ASSESSMENT AND PLAN: Streptococcus angionosis sepsis -S. Anginosis in two sets of blood cultures -On IV Penicillin -Case discussed with ID by hospitalist - recommend PAC removal and 2 weeks of antibiotics -PAC removed on 02/25/2019 -PAC tip culture negative to date -Repeat blood cultures negative to date  Suspected aspiration pneumonia -Continue IV antibiotics -On mucomyst nebs -SLP has evaluated the patient and barium swallow performed - noted to be aspirating secretions and barium intake, largely silently, and unable to clear aspirates with acute cough/expectoration -The patient has been evaluate for possible feeding tube placement -need to reevaluate goals of care  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  In addition there is persistent prominence in the base of the tongue/vallecula bilaterally suggesting residual disease.  CT of the head and chest were negative. -Given her acute ongoing issues, we will reevaluate work-up for residual disease including biopsy when her clinical condition improved significantly.  Given her poor tolerance of  treatment, candidacy for any additional systemic therapy is unlikely at this time.  However, the patient has continued to decline this hospitalization.  The patient would benefit from comfort measures. -She has PRN Zofran available to her  Pancytopenia -Likely due to recent sepsis -Received Granix X 2 doses with improvement of her white blood cell count and ANC -CBC with differential not obtained this morning -No Granix indicated unless ANC is less than 1.0 -Recommend daily CBC with differential if consistent with goals of care -She has persistent mild anemia and thrombocytopenia.  Pancytopenia likely secondary to acute infection/sepsis. -Transfuse packed red blood cells for hemoglobin less than 7.0 and transfuse platelets for platelet count less than 20,000 or active bleeding  Hypomagnesemia -Secondary to electrolyte wasting from chemotherapy -Mg  2.1 this hospital admission -Continue to monitor   Hypokalemia -Secondary to  electrolyte wasting from chemotherapy -Holding hydrochlorothiazide -Status post IV and oral supplementation -Potassium not checked today  Oral candidiasis -Has received a prolonged course of fluconazole -No recurrent oral candidiasis -Continue oral care  Protein malnutrition -Secondary to chemoradiation -Has declined a feeding tube in the past due to lack of social support and patient preference  -Receiving regular diet, but patient at high risk for aspiration  -Recommend SLP to evaluate for appropriateness of diet -Recommend consideration of feeding tube placement this hospital admission  Goals of care -The patient is being followed closely by the palliative care team -Medical oncologist will reach out to the patient's family member to provide an update -Patient is a DNR/DNI   LOS: 7 days   Mikey Bussing, DNP, AGPCNP-BC, AOCNP 02/28/19

## 2019-02-28 NOTE — Progress Notes (Signed)
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  Patient fell at home, without loss of consciousness.  Currently on liquid diet and keeping up with oral intake apparently.  In the ER hypotensive hypothermic, admitted to ICU with septic shock suspected to be aspiration pneumonia her cancer is stage IVB(she is s/p 5 cycles of carboplatin & xrt>tolerated poorly) and declined feeding tube. 7/25 Patient was transferred to [facility] service  Assessment & Plan:   Principal Problem:   Hypothermia Active Problems:   Hypothyroidism   HYPERLIPIDEMIA, MIXED, MILD   HYPERTENSION, BENIGN ESSENTIAL   Malignant neoplasm of supraglottis (HCC)   Hypokalemia   Pancytopenia (HCC)   Hypotension   Septic shock (HCC)   Bacteremia   Encephalopathy   Streptococcus anginosis sepsis with septic shock: Needed pressors while in ICU, off pressors this am. PT transitioned to comfort care as per discussion with palliative.   Neutropenia unclear etiology likely in the setting of sepsis.   Suspected aspiration pneumonia:  pt remains NPO due to severe aspiration risk. Pt refused any feeding tube placement in the past. Comfort feeds  Hypothyroidism: stop all medications.   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  Oncology on board and in view of deterioration despite aggressive IV therapy and supportive care, and after discussion with family, transitioned to comfort measures.   Hypokalemia/hypernatremia: Improved.   Recurrent vomiting , appears to have resolved. Continue supportive care  Carotid artery stenosis status post left CEA-on aspirin Plavix, .on hold   DVT  prophylaxis: COMFORT CARE.  Code Status:DNR  Family Communication: none at bedside. Discussed with [name] pt s cousin over the phone.  Disposition Plan: anticipate in house death vs residential hospice.    Consultants:   Oncology  Palliative care.   PCCM   Procedures: PICC line placement.   Antimicrobials: Penicillin.   Subjective: CONFUSED.   Objective: Vitals:   02/28/19 1115 02/28/19 1200 02/28/19 1205 02/28/19 1300  BP: 105/77 120/67  119/62  Pulse:      Resp: (!) 25 20  (!) 23  Temp:   97.8 F (36.6 C)   TempSrc:   Axillary   SpO2: 100% 100%  100%  Weight:      Height:        Intake/Output Summary (Last 24 hours) at 02/28/2019 1412 Last data filed at 02/28/2019 1200 Gross per 24 hour  Intake 3374.45 ml  Output 2400 ml  Net 974.45 ml   Filed Weights   02/27/19 0521 02/27/19 2010 02/28/19 0355  Weight: 77.7 kg 77.9 kg 78.8 kg    Examination:  General exam: confused, lethargic Respiratory system: diminished at bases. No wheezing heard.  Cardiovascular system: S1 & S2 heard, RRR. Gastrointestinal system: Abdomen is soft NT.  Central nervous system: confused, Extremities: no pedal edema.  Skin: No rashes, lesions or ulcers Psychiatry: cannot be assessed.     Data Reviewed: I have personally reviewed following labs and imaging studies  CBC: Recent Labs  Lab 02/24/19 0322 02/25/19 0350 02/25/19 0941 02/26/19 0533 02/27/19 0702  WBC 1.4* 4.4 4.3 6.1 1.8*  NEUTROABS  --   --  3.9 5.4 1.6*  HGB 9.2* 9.2* 9.2* 9.6* 8.1*  HCT 28.1* 28.5* 28.7* 30.4* 24.6*  MCV 95.9 95.0 97.0 97.7 94.3  PLT 67* 65* 64* 61* 40*   Basic Metabolic Panel: Recent Labs  Lab 02/22/19 2013 02/23/19 0418 02/24/19 0322 02/25/19 0350 02/26/19 0533  NA 147* 146* 142 140 138  K 3.6 3.8 3.8 4.3 5.0  CL 109 106 99 96* 99  CO2 32 32 35* 35* 29  GLUCOSE 116* 116* 148* 135* 113*  BUN 12 11 9  6* 7*  CREATININE 0.71 0.61 0.40* 0.44 0.49  CALCIUM 8.3* 8.2* 8.2* 8.4* 8.6*    GFR: Estimated Creatinine Clearance: 70.3 mL/min (by C-G formula based on SCr of 0.49 mg/dL). Liver Function Tests: Recent Labs  Lab 02/22/19 0351  AST 16  ALT 22  ALKPHOS 36*  BILITOT 1.0  PROT 5.2*  ALBUMIN 3.0*   No results for input(s): LIPASE, AMYLASE in the last 168 hours. No results for input(s): AMMONIA in the last 168 hours. Coagulation Profile: Recent Labs  Lab 02/27/19 0702  INR 1.0   Cardiac Enzymes: No results for input(s): CKTOTAL, CKMB, CKMBINDEX, TROPONINI in the last 168 hours. BNP (last 3 results) No results for input(s): PROBNP in the last 8760 hours. HbA1C: No results for input(s): HGBA1C in the last 72 hours. CBG: No results for input(s): GLUCAP in the last 168 hours. Lipid Profile: No results for input(s): CHOL, HDL, LDLCALC, TRIG, CHOLHDL, LDLDIRECT in the last 72 hours. Thyroid Function Tests: No results for input(s): TSH, T4TOTAL, FREET4, T3FREE, THYROIDAB in the last 72 hours. Anemia Panel: No results for input(s): VITAMINB12, FOLATE, FERRITIN, TIBC, IRON, RETICCTPCT in the last 72 hours. Sepsis Labs: Recent Labs  Lab 02/27/19 2057  LATICACIDVEN 1.7    Recent Results (from the past 240 hour(s))  Blood culture (routine x 2)     Status: Abnormal   Collection Time: 02/05/2019  6:02 PM   Specimen: BLOOD  Result Value Ref Range Status   Specimen Description   Final    BLOOD RIGHT ANTECUBITAL Performed at [facility] [address] [place], [place] [address]    Special Requests   Final    BOTTLES DRAWN AEROBIC AND ANAEROBIC Blood Culture adequate volume Performed at [facility] [address], [place], [place] [address]    Culture  Setup Time   Final    ANAEROBIC BOTTLE ONLY GRAM POSITIVE COCCI CRITICAL VALUE NOTED.  VALUE IS CONSISTENT WITH PREVIOUSLY REPORTED AND CALLED VALUE.    Culture (A)  Final    STREPTOCOCCUS ANGINOSIS SUSCEPTIBILITIES PERFORMED ON PREVIOUS CULTURE WITHIN THE LAST 5  DAYS. Performed at [facility], [place] [address], [place], [place] [address]    Report Status 02/23/2019 FINAL  Final  Blood culture (routine x 2)     Status: Abnormal   Collection Time: 02/14/2019  6:02 PM   Specimen: BLOOD  Result Value Ref Range Status   Specimen Description   Final    BLOOD RIGHT CHEST Performed at [facility] [address] [place], [place] [address]    Special Requests   Final    BOTTLES DRAWN AEROBIC AND ANAEROBIC Blood Culture adequate volume Performed at [facility] [address], [place], [place] [address]    Culture  Setup Time   Final    GRAM POSITIVE COCCI IN CHAINS IN BOTH AEROBIC AND ANAEROBIC BOTTLES CRITICAL RESULT CALLED TO, READ BACK BY AND VERIFIED WITHSeleta Rhymes [facility] 5916 02/21/19 A BROWNING Performed at [facility], [place] [address] [place], [place]  [address]    Culture STREPTOCOCCUS ANGINOSIS (A)  Final   Report Status 02/23/2019 FINAL  Final   Organism ID, Bacteria STREPTOCOCCUS ANGINOSIS  Final      Susceptibility   Streptococcus anginosis - MIC*    PENICILLIN <=0.06 SENSITIVE Sensitive     CEFTRIAXONE 0.25 SENSITIVE Sensitive     ERYTHROMYCIN <=0.12 SENSITIVE Sensitive     LEVOFLOXACIN 0.5 SENSITIVE Sensitive     VANCOMYCIN 1 SENSITIVE Sensitive     * STREPTOCOCCUS ANGINOSIS  Blood Culture ID Panel (Reflexed)     Status: Abnormal   Collection Time: 02/24/2019  6:02 PM  Result Value Ref Range Status   Enterococcus species NOT DETECTED NOT DETECTED Final   Listeria monocytogenes NOT DETECTED NOT DETECTED Final   Staphylococcus species NOT DETECTED NOT DETECTED Final   Staphylococcus aureus (BCID) NOT DETECTED NOT DETECTED Final   Streptococcus species DETECTED (A) NOT DETECTED Final    Comment: Not Enterococcus species, Streptococcus agalactiae, Streptococcus pyogenes, or Streptococcus pneumoniae. CRITICAL RESULT CALLED TO, READ BACK BY AND VERIFIED WITH: Seleta Rhymes PHARMD [id number]  02/21/19 A BROWNING    Streptococcus agalactiae NOT DETECTED NOT DETECTED Final   Streptococcus pneumoniae NOT DETECTED NOT DETECTED Final   Streptococcus pyogenes NOT DETECTED NOT DETECTED Final   Acinetobacter baumannii NOT DETECTED NOT DETECTED Final   Enterobacteriaceae species NOT DETECTED NOT DETECTED Final   Enterobacter cloacae complex NOT DETECTED NOT DETECTED Final   Escherichia coli NOT DETECTED NOT DETECTED Final   Klebsiella oxytoca NOT DETECTED NOT DETECTED Final   Klebsiella pneumoniae NOT DETECTED NOT DETECTED Final   Proteus species NOT DETECTED NOT DETECTED Final   Serratia marcescens NOT DETECTED NOT DETECTED Final   Haemophilus influenzae NOT DETECTED NOT DETECTED Final   Neisseria meningitidis NOT DETECTED NOT DETECTED Final   Pseudomonas aeruginosa NOT DETECTED NOT DETECTED Final   Candida albicans NOT DETECTED NOT DETECTED Final   Candida glabrata NOT DETECTED NOT DETECTED Final   Candida krusei NOT DETECTED NOT DETECTED Final   Candida parapsilosis NOT DETECTED NOT DETECTED Final   Candida tropicalis NOT DETECTED NOT DETECTED Final    Comment: Performed at [facility], [place] [address] [place], [place] [address]  SARS Coronavirus 2 (CEPHEID - Performed in [facility] lab), Hosp Order     Status: None   Collection Time: 02/06/2019  8:59 PM   Specimen: Nasopharyngeal Swab  Result Value Ref Range Status   SARS Coronavirus 2 NEGATIVE NEGATIVE Final    Comment: (NOTE) If result is NEGATIVE SARS-CoV-2 target nucleic acids are NOT DETECTED. The SARS-CoV-2 RNA is generally detectable in upper and lower  respiratory specimens during the acute phase of infection. The lowest  concentration of SARS-CoV-2 viral copies this assay can detect is 250  copies / mL. A negative result does not preclude SARS-CoV-2 infection  and should not be used as the sole basis for treatment or other  patient management decisions.  A negative result may occur with  improper  specimen collection / handling, submission of specimen other  than nasopharyngeal swab, presence of viral mutation(s) within the  areas targeted by this assay, and inadequate number of viral copies  (<250 copies / mL). A negative result must be combined with clinical  observations, patient history, and epidemiological information. If result is POSITIVE SARS-CoV-2 target nucleic acids are DETECTED. The SARS-CoV-2 RNA is generally detectable in upper and lower  respiratory specimens dur ing the acute phase of infection.  Positive  results are  indicative of active infection with SARS-CoV-2.  Clinical  correlation with patient history and other diagnostic information is  necessary to determine patient infection status.  Positive results do  not rule out bacterial infection or co-infection with other viruses. If result is PRESUMPTIVE POSTIVE SARS-CoV-2 nucleic acids MAY BE PRESENT.   A presumptive positive result was obtained on the submitted specimen  and confirmed on repeat testing.  While 2019 novel coronavirus  (SARS-CoV-2) nucleic acids may be present in the submitted sample  additional confirmatory testing may be necessary for epidemiological  and / or clinical management purposes  to differentiate between  SARS-CoV-2 and other Sarbecovirus currently known to infect humans.  If clinically indicated additional testing with an alternate test  methodology [phone]) is advised. The SARS-CoV-2 RNA is generally  detectable in upper and lower respiratory sp ecimens during the acute  phase of infection. The expected result is Negative. Fact Sheet for Patients:  StrictlyIdeas.no Fact Sheet for Healthcare Providers: BankingDealers.co.za This test is not yet approved or cleared by the [place] FDA and has been authorized for detection and/or diagnosis of SARS-CoV-2 by FDA under an Emergency Use Authorization (EUA).  This EUA will remain in  effect (meaning this test can be used) for the duration of the COVID-19 declaration under Section 564(b)(1) of the Act, 21 U.S.C. section 360bbb-3(b)(1), unless the authorization is terminated or revoked sooner. Performed at [facility], [place] [address], [place], [place] [address]   MRSA PCR Screening     Status: None   Collection Time: 02/21/19  2:56 PM   Specimen: Nasal Mucosa; Nasopharyngeal  Result Value Ref Range Status   MRSA by PCR NEGATIVE NEGATIVE Final    Comment:        The GeneXpert MRSA Assay (FDA approved for NASAL specimens only), is one component of a comprehensive MRSA colonization surveillance program. It is not intended to diagnose MRSA infection nor to guide or monitor treatment for MRSA infections. Performed at [facility], [place] [address], [place], [place] [address]   Cath Tip Culture     Status: None   Collection Time: 02/25/19  2:03 PM   Specimen: Porta Cath; Other  Result Value Ref Range Status   Specimen Description   Final    PORTA CATH RIGHT CHEST Performed at [facility] [address] [place], [place] [address]    Special Requests   Final    NONE Performed at [facility], [place] [address], [place], [place] [address]    Culture   Final    NO GROWTH 2 DAYS Performed at [facility] [address] [place], [place] [address]    Report Status 02/28/2019 FINAL  Final  Culture, blood (Routine X 2) w Reflex to ID Panel     Status: None (Preliminary result)   Collection Time: 02/27/19 12:35 PM   Specimen: BLOOD  Result Value Ref Range Status   Specimen Description   Final    BLOOD RIGHT ARM Performed at [facility] [address] [place], [place] [address]    Special Requests   Final    BOTTLES DRAWN AEROBIC ONLY Blood Culture results may not be optimal due to an inadequate volume of blood received in culture bottles Performed at [facility] [address], [place],  [address]    Culture   Final    NO GROWTH < 24 HOURS Performed at [facility],  [address], [place], [place] [address]    Report Status PENDING  Incomplete  Culture, blood (Routine X 2) w Reflex to ID Panel     Status: None (Preliminary result)   Collection Time: 02/27/19 12:50 PM   Specimen: BLOOD  Result Value Ref Range Status   Specimen Description   Final    BLOOD RIGHT ARM Performed at [facility] [address], [place], [place] [address]    Special Requests   Final    BOTTLES DRAWN AEROBIC ONLY Blood Culture adequate volume Performed at [facility] [address], [place], [place] [address]    Culture   Final    NO GROWTH < 24 HOURS Performed at [facility] [address] [place], [place] [address]    Report Status PENDING  Incomplete         Radiology Studies: Ct Head W & Wo Contrast  Result Date: 02/27/2019 CLINICAL DATA:  68 year old female with confusion, lethargy. Supraglottic carcinoma. Staging. EXAM: CT HEAD WITHOUT AND WITH CONTRAST TECHNIQUE: Contiguous axial images were obtained from the base of the skull through the vertex without and with intravenous contrast CONTRAST:  121mL OMNIPAQUE IOHEXOL 300 MG/ML  SOLN COMPARISON:  Neck CT today reported separately. FINDINGS: Brain: Study is mildly degraded by motion artifact despite repeated imaging attempts. Cerebral volume is within normal limits for age. Minimal to mild for age scattered white matter hypodensity. No midline shift, ventriculomegaly, mass effect, evidence of mass lesion, intracranial hemorrhage or evidence of cortically based acute infarction. No abnormal enhancement identified. Vascular: Calcified atherosclerosis at the skull base. The major intracranial vascular structures are enhancing as expected. Skull: Motion artifact at the skull base. No acute or suspicious osseous lesion  identified. Sinuses/Orbits: Minor sphenoid sinus mucosal thickening, otherwise well pneumatized. Other: Negative visible orbit and scalp soft tissues. IMPRESSION: No acute or metastatic intracranial abnormality. Essentially normal for age CT appearance of the brain. Electronically Signed   By: Genevie Ann M.D.   On: 02/27/2019 12:00   Ct Soft Tissue Neck W Contrast  Result Date: 02/27/2019 CLINICAL DATA:  History of head/neck cancer, restaging. Additional history: Restaging of supraglottic cancer, chemotherapy completed 01/29/2019. Confusion, pancytopenia, sepsis. EXAM: CT NECK WITH CONTRAST TECHNIQUE: Multidetector CT imaging of the neck was performed using the standard protocol following the bolus administration of intravenous contrast. CONTRAST:  114mL OMNIPAQUE IOHEXOL 300 MG/ML  SOLN COMPARISON:  NECK CT 10/26/2018. FINDINGS: Pharynx and larynx: Please note the examination is moderately motion degraded, which limits evaluation. Within this limitation, there is a similar appearance of soft tissue prominence at the tongue base/vallecula bilaterally which is partially inseparable from the lingual surface of the epiglottis. Thickening of the left aryepiglottic fold was better appreciated on prior examination, although this may be secondary to motion on the current study. There is generalized soft tissue swelling within the supraglottic larynx which is new from prior exam. Resultant mild to moderate airway narrowing. No obvious abnormality of the true vocal cords. Unchanged asymmetric sclerosis of the left arytenoid cartilage. Salivary glands: No abnormality identified. Thyroid: Status post thyroidectomy. Lymph nodes: Bulky heterogeneous right level 2A lymphadenopathy persists but has decreased in size since prior examination, now measuring 3.8 x 3.0 cm (previously 5.0 x 3.3 cm). Previously demonstrated left level 2A lymphadenopathy has significantly decreased, now measuring 3.2 x 2.0 cm in transaxial dimensions  (previously 5.4 x 2.7 cm). No new cervical lymphadenopathy is identified. Vascular: Prominent atherosclerotic disease at the right carotid  bifurcation. Redemonstrated left carotid stent. As before, right-sided lymphadenopathy partially encases the right internal jugular vein with mild luminal narrowing. Left-sided lymphadenopathy partially encases the proximal left cervical internal carotid artery without significant luminal narrowing. Limited intracranial: Unremarkable Visualized orbits: Unremarkable Mastoids and visualized paranasal sinuses: Small amount of scattered fluid within the bilateral ethmoid and sphenoid sinuses. No significant mastoid effusion. Skeleton: Cervical spondylosis with advanced multilevel disc and facet degeneration. Upper chest: Please refer to concurrent chest CT for description of intrathoracic findings. IMPRESSION: Moderately motion degraded examination, limiting assessment. Soft tissue prominence at the tongue base/vallecula bilaterally has not significantly changed since neck CT 10/26/2018. Findings likely reflect residual tumor, possibly with superimposed treatment related changes. Thickening of the left aryepiglottic fold was better appreciated on prior exam, although this may be related to motion degradation of the current study. Nonspecific generalized soft tissue thickening of the supraglottic larynx may reflect treatment related changes. Consider direct visualization, as warranted. Resultant mild to moderate airway narrowing. Persistent although decreased bilateral level IIA lymphadenopathy likely reflecting nodal metastases. No new cervical lymphadenopathy. Electronically Signed   By: Kellie Simmering   On: 02/27/2019 12:42   Ct Chest W Contrast  Result Date: 02/27/2019 CLINICAL DATA:  Supraglottic carcinoma. Chemotherapy complete 01/29/2019. EXAM: CT CHEST WITH CONTRAST TECHNIQUE: Multidetector CT imaging of the chest was performed during intravenous contrast administration.  CONTRAST:  14mL OMNIPAQUE IOHEXOL 300 MG/ML  SOLN COMPARISON:  None. FINDINGS: Cardiovascular: Coronary artery calcification and aortic atherosclerotic calcification. Mediastinum/Nodes: No axillary or supraclavicular adenopathy. No mediastinal adenopathy. Central venous line with tip in the distal SVC. Lungs/Pleura: bibasilar atelectasis. No pneumonia or aspiration pneumonitis identified. No pulmonary nodularity. Upper Abdomen: No hiatal hernia.  Stomach in expected location. Musculoskeletal: No aggressive osseous lesion. IMPRESSION: 1. No thoracic metastasis. 2. Normal stomach anatomy. 3. Coronary artery calcification and Aortic Atherosclerosis (ICD10-I70.0). Electronically Signed   By: Suzy Bouchard M.D.   On: 02/27/2019 11:13   [place] Ekg Site Rite  Result Date: 02/26/2019 If Site Rite image not attached, placement could not be confirmed due to current cardiac rhythm.       Scheduled Meds: . atropine  2 drop Sublingual QID  . Chlorhexidine Gluconate Cloth  6 each Topical Daily   Continuous Infusions:    LOS: 7 days    Time spent: 35 minutes.     Hosie Poisson, MD [facility] Pager [phone]  If 7PM-7AM, please contact night-coverage [email or web address] Password [email or web address] 02/28/2019, 2:12 PM

## 2019-02-28 NOTE — Progress Notes (Signed)
Urine culture not collected. Theora Gianotti, MD verbally asked RN to discontinue order. Will continue to monitor.

## 2019-03-01 DIAGNOSIS — T68XXXD Hypothermia, subsequent encounter: Secondary | ICD-10-CM

## 2019-03-02 NOTE — Discharge Summary (Signed)
Death Summary  Gloria Murray:[id number] DOB: 06-23-1951 DOA: 2019-03-11  PCP: Jearld Fenton, NP  Admit date: March 11, 2019 Date of Death: 03/20/2019 Time of Death: 01:49 am.   History of present illness:  Streptococcus anginosis sepsis with septic shock:Needed pressors while in ICU, off pressors and palliative on board and after discussion with family, she was transitioned to comfort care and passed away this am.   Neutropenia unclear etiology likely in the setting of sepsis.   Suspected aspiration pneumonia:comfort feeds.   Hypothyroidism:stop all medications.   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  Oncology on board and in view of deterioration despite aggressive IV therapy and supportive care, and after discussion with family, transitioned to comfort measures.   Hypokalemia/hypernatremia:  Recurrent vomiting , supportive care .  Carotid artery stenosisstatus post left CEA-   Final Diagnoses:  1.   Hypothermia Active Problems:   Hypothyroidism   HYPERLIPIDEMIA, MIXED, MILD   HYPERTENSION, BENIGN ESSENTIAL   Malignant neoplasm of supraglottis (HCC)   Hypokalemia   Pancytopenia (HCC)   Hypotension   Septic shock (HCC)   Bacteremia   Encephalopathy  The results of significant diagnostics from this hospitalization (including imaging, microbiology, ancillary and laboratory) are listed below for reference.    Significant Diagnostic Studies: Ct Head W & Wo Contrast  Result Date: 02/27/2019 CLINICAL DATA:  67 year old female with confusion, lethargy. Supraglottic carcinoma. Staging. EXAM: CT HEAD WITHOUT AND WITH CONTRAST TECHNIQUE: Contiguous axial images were obtained from the base of the skull through the vertex without and with intravenous contrast CONTRAST:  186mL OMNIPAQUE IOHEXOL 300 MG/ML  SOLN COMPARISON:  Neck CT today reported separately. FINDINGS: Brain: Study is mildly degraded by motion artifact despite repeated  imaging attempts. Cerebral volume is within normal limits for age. Minimal to mild for age scattered white matter hypodensity. No midline shift, ventriculomegaly, mass effect, evidence of mass lesion, intracranial hemorrhage or evidence of cortically based acute infarction. No abnormal enhancement identified. Vascular: Calcified atherosclerosis at the skull base. The major intracranial vascular structures are enhancing as expected. Skull: Motion artifact at the skull base. No acute or suspicious osseous lesion identified. Sinuses/Orbits: Minor sphenoid sinus mucosal thickening, otherwise well pneumatized. Other: Negative visible orbit and scalp soft tissues. IMPRESSION: No acute or metastatic intracranial abnormality. Essentially normal for age CT appearance of the brain. Electronically Signed   By: Genevie Ann M.D.   On: 02/27/2019 12:00   Ct Soft Tissue Neck W Contrast  Result Date: 02/27/2019 CLINICAL DATA:  History of head/neck cancer, restaging. Additional history: Restaging of supraglottic cancer, chemotherapy completed 01/29/2019. Confusion, pancytopenia, sepsis. EXAM: CT NECK WITH CONTRAST TECHNIQUE: Multidetector CT imaging of the neck was performed using the standard protocol following the bolus administration of intravenous contrast. CONTRAST:  133mL OMNIPAQUE IOHEXOL 300 MG/ML  SOLN COMPARISON:  NECK CT 10/26/2018. FINDINGS: Pharynx and larynx: Please note the examination is moderately motion degraded, which limits evaluation. Within this limitation, there is a similar appearance of soft tissue prominence at the tongue base/vallecula bilaterally which is partially inseparable from the lingual surface of the epiglottis. Thickening of the left aryepiglottic fold was better appreciated on prior examination, although this may be secondary to motion on the current study. There is generalized soft tissue swelling within the supraglottic larynx which is new from prior exam. Resultant mild to moderate airway  narrowing. No obvious abnormality of the true vocal cords. Unchanged asymmetric sclerosis of the left arytenoid cartilage. Salivary glands: No abnormality identified. Thyroid: Status post  thyroidectomy. Lymph nodes: Bulky heterogeneous right level 2A lymphadenopathy persists but has decreased in size since prior examination, now measuring 3.8 x 3.0 cm (previously 5.0 x 3.3 cm). Previously demonstrated left level 2A lymphadenopathy has significantly decreased, now measuring 3.2 x 2.0 cm in transaxial dimensions (previously 5.4 x 2.7 cm). No new cervical lymphadenopathy is identified. Vascular: Prominent atherosclerotic disease at the right carotid bifurcation. Redemonstrated left carotid stent. As before, right-sided lymphadenopathy partially encases the right internal jugular vein with mild luminal narrowing. Left-sided lymphadenopathy partially encases the proximal left cervical internal carotid artery without significant luminal narrowing. Limited intracranial: Unremarkable Visualized orbits: Unremarkable Mastoids and visualized paranasal sinuses: Small amount of scattered fluid within the bilateral ethmoid and sphenoid sinuses. No significant mastoid effusion. Skeleton: Cervical spondylosis with advanced multilevel disc and facet degeneration. Upper chest: Please refer to concurrent chest CT for description of intrathoracic findings. IMPRESSION: Moderately motion degraded examination, limiting assessment. Soft tissue prominence at the tongue base/vallecula bilaterally has not significantly changed since neck CT 10/26/2018. Findings likely reflect residual tumor, possibly with superimposed treatment related changes. Thickening of the left aryepiglottic fold was better appreciated on prior exam, although this may be related to motion degradation of the current study. Nonspecific generalized soft tissue thickening of the supraglottic larynx may reflect treatment related changes. Consider direct visualization, as  warranted. Resultant mild to moderate airway narrowing. Persistent although decreased bilateral level IIA lymphadenopathy likely reflecting nodal metastases. No new cervical lymphadenopathy. Electronically Signed   By: Kellie Simmering   On: 02/27/2019 12:42   Ct Chest W Contrast  Result Date: 02/27/2019 CLINICAL DATA:  Supraglottic carcinoma. Chemotherapy complete 01/29/2019. EXAM: CT CHEST WITH CONTRAST TECHNIQUE: Multidetector CT imaging of the chest was performed during intravenous contrast administration. CONTRAST:  197mL OMNIPAQUE IOHEXOL 300 MG/ML  SOLN COMPARISON:  None. FINDINGS: Cardiovascular: Coronary artery calcification and aortic atherosclerotic calcification. Mediastinum/Nodes: No axillary or supraclavicular adenopathy. No mediastinal adenopathy. Central venous line with tip in the distal SVC. Lungs/Pleura: bibasilar atelectasis. No pneumonia or aspiration pneumonitis identified. No pulmonary nodularity. Upper Abdomen: No hiatal hernia.  Stomach in expected location. Musculoskeletal: No aggressive osseous lesion. IMPRESSION: 1. No thoracic metastasis. 2. Normal stomach anatomy. 3. Coronary artery calcification and Aortic Atherosclerosis (ICD10-I70.0). Electronically Signed   By: Suzy Bouchard M.D.   On: 02/27/2019 11:13   Ir Removal [employer] W/o Fl Mod Sed  Result Date: 02/25/2019 CLINICAL DATA:  History of head neck cancer, now admitted with bacteremia and concern for superimposed port infection. Port a catheter was placed by Dr. Laurence Ferrari on 12/06/2018 and had previously functioned well. EXAM: REMOVAL OF IMPLANTED TUNNELED PORT-A-CATH MEDICATIONS: Patient is currently admitted to the hospital receiving intravenous antibiotics. ANESTHESIA/SEDATION: None FLUOROSCOPY TIME:  None PROCEDURE: Informed written consent was obtained from the patient after a discussion of the risk, benefits and alternatives to the procedure. The patient was positioned supine on the fluoroscopy table and the  right chest Port-A-Cath site was prepped with chlorhexidine. A sterile gown and gloves were worn during the procedure. Local anesthesia was provided with 1% lidocaine with epinephrine. A timeout was performed prior to the initiation of the procedure. An incision was made overlying the Port-A-Cath with a #15 scalpel. Utilizing sharp and blunt dissection, the Port-A-Cath was removed completely. Port a Catheter was set aside for potential culture. The pocked was irrigated with sterile saline. Wound closure was performed with subcutaneous 2-0 Vicryl, subcuticular 4-0 Vicryl, Dermabond and Steri-Strips. A dressing was placed. The patient tolerated the procedure well  without immediate post procedural complication. FINDINGS: Successful removal of implant Port-A-Cath without immediate post procedural complication. IMPRESSION: Successful removal of implanted Port-A-Cath. Electronically Signed   By: Sandi Mariscal M.D.   On: 02/25/2019 14:48   Dg Chest Port 1 View  Result Date: 02/22/2019 CLINICAL DATA:  Pneumonia EXAM: PORTABLE CHEST 1 VIEW COMPARISON:  02/21/2019 FINDINGS: Mild bibasilar airspace disease has developed since the prior study. Negative for heart failure or effusion. Heart size normal. Port-A-Cath tip at the cavoatrial junction unchanged. IMPRESSION: Interval development of mild bibasilar atelectasis/infiltrate. Possible pneumonia. Electronically Signed   By: Franchot Gallo M.D.   On: 02/22/2019 08:03   Dg Chest Port 1 View  Result Date: 02/01/2019 CLINICAL DATA:  Weakness.  Fall. EXAM: PORTABLE CHEST 1 VIEW COMPARISON:  PET-CT dated November 23, 2018. Chest x-ray dated Dec 10, 2008. FINDINGS: Right chest wall port catheter with tip at the cavoatrial junction. The heart size and mediastinal contours are within normal limits. Atherosclerotic calcification of the aortic arch. Normal pulmonary vascularity. No focal consolidation, pleural effusion, or pneumothorax. No acute osseous abnormality. Left internal  carotid artery stent. IMPRESSION: No active disease. Electronically Signed   By: Titus Dubin M.D.   On: 02/13/2019 17:43   Dg Swallowing Func-speech Pathology  Result Date: 02/22/2019 Objective Swallowing Evaluation: Type of Study: MBS-Modified Barium Swallow Study  Patient Details Name: ANTONAE ZBIKOWSKI MRN: [id number] Date of Birth: October 27, 1950 Today's Date: 02/22/2019 Time: SLP Start Time (ACUTE ONLY): 1415 -SLP Stop Time (ACUTE ONLY): 1450 SLP Time Calculation (min) (ACUTE ONLY): 35 min Past Medical History: Past Medical History: Diagnosis Date  Allergy   Carotid artery occlusion   Heart murmur   Hyperlipidemia   Hypertension   Hypothyroidism   Laryngeal mass   Osteoporosis   Thyroid disease   Wears glasses   Wears upper complete and lower partial dentures  Past Surgical History: Past Surgical History: Procedure Laterality Date  ARCH AORTOGRAM N/A 07/23/2014  Procedure: ARCH AORTOGRAM;  Surgeon: Rosetta Posner, MD;  Location: [facility] CATH LAB;  Service: Cardiovascular;  Laterality: N/A;  CAROTID ANGIOGRAM N/A 07/23/2014  Procedure: CAROTID ANGIOGRAM;  Surgeon: Rosetta Posner, MD;  Location: [facility] CATH LAB;  Service: Cardiovascular;  Laterality: N/A;  CAROTID ENDARTERECTOMY  12/11/2008  left  CAROTID STENT INSERTION Left 10/14/2014  Procedure: CAROTID STENT INSERTION;  Surgeon: Serafina Mitchell, MD;  Location: [facility] CATH LAB;  Service: Cardiovascular;  Laterality: Left;   carotid  CARPAL TUNNEL RELEASE    X 2, right wrist  COLONOSCOPY  Jan. 2, 2015  DILATION AND CURETTAGE OF UTERUS    X 2  IR IMAGING GUIDED PORT INSERTION  12/06/2018  laceration hand Right   hand  LARYNGOSCOPY N/A 11/08/2018  Procedure: DIRECT MICROLARYNGOSCOPY WITH BIOPSY;  Surgeon: Melida Quitter, MD;  Location: [facility];  Service: ENT;  Laterality: N/A;  RIGID ESOPHAGOSCOPY N/A 11/08/2018  Procedure: ESOPHAGOSCOPY;  Surgeon: Melida Quitter, MD;  Location: [facility] OR;  Service: ENT;  Laterality: N/A;  THYROIDECTOMY    TUBAL LIGATION  1975  bilateral  HPI: 68 yo female with treatment for Laryngeal cancer, involving epiglottis currently undergoing chemo and radiation therapy, carotid artery occlusion, heart murmur, hypothyroidism, osteoporosis, hyperlipidemia.  Pt CXR 02/22/2019 showed Interval development of mild bibasilar atelectasis/infiltrate.  Pt had n/v prior to admission ? source of pna but with head and neck cancer, pharyngeal dysphagia may contribute.  Subjective: pt awake in bed Assessment / Plan / Recommendation CHL IP CLINICAL IMPRESSIONS 02/22/2019 Clinical Impression Patient presents with  severe oropharyngeal dysphagia due to XRT, edema and decreased motility.  She is aspirating secretions and barium intake *largely silently* and unable to clear aspirates or with cued cough/expectoration.  Delayed oral transiting noted resulting in premature spillage of barium into pharynx.  Swallow is weak and pharyngeal trigger is delayed resulting in laryngeal penetration and aspiration and significant residuals. Various postures including chin tuck with and without head turn left *side with edema* and neck extension did not improve airway protection nor pharyngeal clearance.  At this time, swallow dysfunction does not allow pt to consume po efficiently or without aspiration.  Educated her to findings using teach back and live feed.   Will follow up for swallowing exercises and indication for repeat instrumental swallow evaluation.   Hopeful for continued swallow function with decreased edema and healing from XRT.  Recommend pt be able to consume water and ice chips freely after oral care to decrease disuse muscle atrophy.        SLP Visit Diagnosis Dysphagia, oropharyngeal phase (R13.12) Attention and concentration deficit following -- Frontal lobe and executive function deficit following -- Impact on safety and function --   CHL IP TREATMENT RECOMMENDATION 02/22/2019 Treatment Recommendations Therapy as outlined in treatment plan below   Prognosis 02/22/2019  Prognosis for Safe Diet Advancement Guarded Barriers to Reach Goals Severity of deficits Barriers/Prognosis Comment -- CHL IP DIET RECOMMENDATION 02/22/2019 SLP Diet Recommendations NPO;Free water protocol after oral care Liquid Administration via -- Medication Administration Via alternative means Compensations -- Postural Changes --   CHL IP OTHER RECOMMENDATIONS 02/22/2019 Recommended Consults -- Oral Care Recommendations Oral care QID Other Recommendations Have oral suction available   CHL IP FOLLOW UP RECOMMENDATIONS 02/22/2019 Follow up Recommendations Home health SLP   CHL IP FREQUENCY AND DURATION 02/22/2019 Speech Therapy Frequency (ACUTE ONLY) min 2x/week Treatment Duration 2 weeks      CHL IP ORAL PHASE 02/22/2019 Oral Phase Impaired Oral - Pudding Teaspoon -- Oral - Pudding Cup -- Oral - Honey Teaspoon -- Oral - Honey Cup -- Oral - Nectar Teaspoon Delayed oral transit;Reduced posterior propulsion;Premature spillage;Decreased bolus cohesion;Weak lingual manipulation Oral - Nectar Cup Delayed oral transit;Reduced posterior propulsion;Premature spillage;Decreased bolus cohesion;Weak lingual manipulation Oral - Nectar Straw -- Oral - Thin Teaspoon Delayed oral transit;Reduced posterior propulsion;Premature spillage;Decreased bolus cohesion;Weak lingual manipulation Oral - Thin Cup Delayed oral transit;Reduced posterior propulsion;Premature spillage;Decreased bolus cohesion;Weak lingual manipulation Oral - Thin Straw Reduced posterior propulsion;Delayed oral transit;Premature spillage;Decreased bolus cohesion;Weak lingual manipulation Oral - Puree Delayed oral transit;Reduced posterior propulsion;Premature spillage;Decreased bolus cohesion;Weak lingual manipulation Oral - Mech Soft -- Oral - Regular -- Oral - Multi-Consistency -- Oral - Pill -- Oral Phase - Comment --  CHL IP PHARYNGEAL PHASE 02/22/2019 Pharyngeal Phase Impaired Pharyngeal- Pudding Teaspoon -- Pharyngeal -- Pharyngeal- Pudding Cup -- Pharyngeal --  Pharyngeal- Honey Teaspoon -- Pharyngeal -- Pharyngeal- Honey Cup -- Pharyngeal -- Pharyngeal- Nectar Teaspoon -- Pharyngeal -- Pharyngeal- Nectar Cup Reduced pharyngeal peristalsis;Reduced epiglottic inversion;Reduced anterior laryngeal mobility;Reduced laryngeal elevation;Reduced airway/laryngeal closure;Reduced tongue base retraction Pharyngeal Material enters airway, passes BELOW cords without attempt by patient to eject out (silent aspiration) Pharyngeal- Nectar Straw -- Pharyngeal -- Pharyngeal- Thin Teaspoon Reduced pharyngeal peristalsis;Reduced epiglottic inversion;Reduced laryngeal elevation;Reduced airway/laryngeal closure;Reduced tongue base retraction;Pharyngeal residue - valleculae;Pharyngeal residue - pyriform;Penetration/Aspiration during swallow Pharyngeal Material enters airway, passes BELOW cords without attempt by patient to eject out (silent aspiration) Pharyngeal- Thin Cup Reduced pharyngeal peristalsis;Pharyngeal residue - valleculae;Reduced laryngeal elevation;Pharyngeal residue - pyriform;Reduced airway/laryngeal closure;Reduced tongue base retraction;Reduced epiglottic inversion;Penetration/Aspiration during  swallow;Moderate aspiration Pharyngeal Material enters airway, passes BELOW cords without attempt by patient to eject out (silent aspiration) Pharyngeal- Thin Straw Reduced pharyngeal peristalsis;Pharyngeal residue - pyriform;Pharyngeal residue - valleculae;Moderate aspiration;Reduced epiglottic inversion;Reduced anterior laryngeal mobility;Reduced laryngeal elevation;Compensatory strategies attempted (with notebox);Reduced airway/laryngeal closure;Penetration/Apiration after swallow;Penetration/Aspiration during swallow Pharyngeal Material enters airway, passes BELOW cords without attempt by patient to eject out (silent aspiration) Pharyngeal- Puree Reduced epiglottic inversion;Reduced tongue base retraction;Reduced laryngeal elevation;Reduced anterior laryngeal mobility;Reduced  pharyngeal peristalsis;Pharyngeal residue - valleculae Pharyngeal -- Pharyngeal- Mechanical Soft -- Pharyngeal -- Pharyngeal- Regular -- Pharyngeal -- Pharyngeal- Multi-consistency -- Pharyngeal -- Pharyngeal- Pill -- Pharyngeal -- Pharyngeal Comment chin tuck not helpful to prevent aspiration, neck extension  CHL IP CERVICAL ESOPHAGEAL PHASE 02/22/2019 Cervical Esophageal Phase WFL Pudding Teaspoon -- Pudding Cup -- Honey Teaspoon -- Honey Cup -- Nectar Teaspoon -- Nectar Cup -- Nectar Straw -- Thin Teaspoon -- Thin Cup -- Thin Straw -- Puree -- Mechanical Soft -- Regular -- Multi-consistency -- Pill -- Cervical Esophageal Comment -- Macario Golds 02/22/2019, 5:01 PM    Luanna Salk, MS [facility] SLP Acute Rehab Services Pager [phone] Office [phone]           [place] Ekg Site Rite  Result Date: 02/26/2019 If Site Rite image not attached, placement could not be confirmed due to current cardiac rhythm.   Microbiology: Recent Results (from the past 240 hour(s))  Blood culture (routine x 2)     Status: Abnormal   Collection Time: 02/03/2019  6:02 PM   Specimen: BLOOD  Result Value Ref Range Status   Specimen Description   Final    BLOOD RIGHT ANTECUBITAL Performed at [facility] [address], [place], [place] [address]    Special Requests   Final    BOTTLES DRAWN AEROBIC AND ANAEROBIC Blood Culture adequate volume Performed at [facility] [address] [place], [place] [address]    Culture  Setup Time   Final    ANAEROBIC BOTTLE ONLY GRAM POSITIVE COCCI CRITICAL VALUE NOTED.  VALUE IS CONSISTENT WITH PREVIOUSLY REPORTED AND CALLED VALUE.    Culture (A)  Final    STREPTOCOCCUS ANGINOSIS SUSCEPTIBILITIES PERFORMED ON PREVIOUS CULTURE WITHIN THE LAST 5 DAYS. Performed at [facility], [place] [address] [place], [place] [address]    Report Status 02/23/2019 FINAL  Final  Blood culture (routine x 2)     Status: Abnormal   Collection Time:  02/17/2019  6:02 PM   Specimen: BLOOD  Result Value Ref Range Status   Specimen Description   Final    BLOOD RIGHT CHEST Performed at [facility] [address] [place], [place] [address]    Special Requests   Final    BOTTLES DRAWN AEROBIC AND ANAEROBIC Blood Culture adequate volume Performed at [facility] [address], [place], [place] [address]    Culture  Setup Time   Final    GRAM POSITIVE COCCI IN CHAINS IN BOTH AEROBIC AND ANAEROBIC BOTTLES CRITICAL RESULT CALLED TO, READ BACK BY AND VERIFIED WITHSeleta Rhymes [facility] 6720 02/21/19 A BROWNING Performed at [facility], [place] [address], [place], [place] [address]    Culture STREPTOCOCCUS ANGINOSIS (A)  Final   Report Status 02/23/2019 FINAL  Final   Organism ID, Bacteria STREPTOCOCCUS ANGINOSIS  Final      Susceptibility   Streptococcus anginosis - MIC*    PENICILLIN <=0.06 SENSITIVE Sensitive     CEFTRIAXONE 0.25 SENSITIVE Sensitive  ERYTHROMYCIN <=0.12 SENSITIVE Sensitive     LEVOFLOXACIN 0.5 SENSITIVE Sensitive     VANCOMYCIN 1 SENSITIVE Sensitive     * STREPTOCOCCUS ANGINOSIS  Blood Culture ID Panel (Reflexed)     Status: Abnormal   Collection Time: 02/14/2019  6:02 PM  Result Value Ref Range Status   Enterococcus species NOT DETECTED NOT DETECTED Final   Listeria monocytogenes NOT DETECTED NOT DETECTED Final   Staphylococcus species NOT DETECTED NOT DETECTED Final   Staphylococcus aureus (BCID) NOT DETECTED NOT DETECTED Final   Streptococcus species DETECTED (A) NOT DETECTED Final    Comment: Not Enterococcus species, Streptococcus agalactiae, Streptococcus pyogenes, or Streptococcus pneumoniae. CRITICAL RESULT CALLED TO, READ BACK BY AND VERIFIED WITH: Seleta Rhymes PHARMD [id number] 02/21/19 A BROWNING    Streptococcus agalactiae NOT DETECTED NOT DETECTED Final   Streptococcus pneumoniae NOT DETECTED NOT DETECTED Final   Streptococcus pyogenes NOT DETECTED NOT DETECTED Final    Acinetobacter baumannii NOT DETECTED NOT DETECTED Final   Enterobacteriaceae species NOT DETECTED NOT DETECTED Final   Enterobacter cloacae complex NOT DETECTED NOT DETECTED Final   Escherichia coli NOT DETECTED NOT DETECTED Final   Klebsiella oxytoca NOT DETECTED NOT DETECTED Final   Klebsiella pneumoniae NOT DETECTED NOT DETECTED Final   Proteus species NOT DETECTED NOT DETECTED Final   Serratia marcescens NOT DETECTED NOT DETECTED Final   Haemophilus influenzae NOT DETECTED NOT DETECTED Final   Neisseria meningitidis NOT DETECTED NOT DETECTED Final   Pseudomonas aeruginosa NOT DETECTED NOT DETECTED Final   Candida albicans NOT DETECTED NOT DETECTED Final   Candida glabrata NOT DETECTED NOT DETECTED Final   Candida krusei NOT DETECTED NOT DETECTED Final   Candida parapsilosis NOT DETECTED NOT DETECTED Final   Candida tropicalis NOT DETECTED NOT DETECTED Final    Comment: Performed at [facility], [place] [address], [place], [place] [address]  SARS Coronavirus 2 (CEPHEID - Performed in [facility] hospital lab), Hosp Order     Status: None   Collection Time: 02/09/2019  8:59 PM   Specimen: Nasopharyngeal Swab  Result Value Ref Range Status   SARS Coronavirus 2 NEGATIVE NEGATIVE Final    Comment: (NOTE) If result is NEGATIVE SARS-CoV-2 target nucleic acids are NOT DETECTED. The SARS-CoV-2 RNA is generally detectable in upper and lower  respiratory specimens during the acute phase of infection. The lowest  concentration of SARS-CoV-2 viral copies this assay can detect is 250  copies / mL. A negative result does not preclude SARS-CoV-2 infection  and should not be used as the sole basis for treatment or other  patient management decisions.  A negative result may occur with  improper specimen collection / handling, submission of specimen other  than nasopharyngeal swab, presence of viral mutation(s) within the  areas targeted by this assay, and inadequate number of viral copies    (<250 copies / mL). A negative result must be combined with clinical  observations, patient history, and epidemiological information. If result is POSITIVE SARS-CoV-2 target nucleic acids are DETECTED. The SARS-CoV-2 RNA is generally detectable in upper and lower  respiratory specimens dur ing the acute phase of infection.  Positive  results are indicative of active infection with SARS-CoV-2.  Clinical  correlation with patient history and other diagnostic information is  necessary to determine patient infection status.  Positive results do  not rule out bacterial infection or co-infection with other viruses. If result is PRESUMPTIVE POSTIVE SARS-CoV-2 nucleic acids MAY BE PRESENT.   A presumptive positive result  was obtained on the submitted specimen  and confirmed on repeat testing.  While 2019 novel coronavirus  (SARS-CoV-2) nucleic acids may be present in the submitted sample  additional confirmatory testing may be necessary for epidemiological  and / or clinical management purposes  to differentiate between  SARS-CoV-2 and other Sarbecovirus currently known to infect humans.  If clinically indicated additional testing with an alternate test  methodology [phone]) is advised. The SARS-CoV-2 RNA is generally  detectable in upper and lower respiratory sp ecimens during the acute  phase of infection. The expected result is Negative. Fact Sheet for Patients:  StrictlyIdeas.no Fact Sheet for Healthcare Providers: BankingDealers.co.za This test is not yet approved or cleared by the [place] FDA and has been authorized for detection and/or diagnosis of SARS-CoV-2 by FDA under an Emergency Use Authorization (EUA).  This EUA will remain in effect (meaning this test can be used) for the duration of the COVID-19 declaration under Section 564(b)(1) of the Act, 21 U.S.C. section 360bbb-3(b)(1), unless the authorization is terminated  or revoked sooner. Performed at [facility], [place] [address] [place], [place] [address]   MRSA PCR Screening     Status: None   Collection Time: 02/21/19  2:56 PM   Specimen: Nasal Mucosa; Nasopharyngeal  Result Value Ref Range Status   MRSA by PCR NEGATIVE NEGATIVE Final    Comment:        The GeneXpert MRSA Assay (FDA approved for NASAL specimens only), is one component of a comprehensive MRSA colonization surveillance program. It is not intended to diagnose MRSA infection nor to guide or monitor treatment for MRSA infections. Performed at [facility], [place] [address] [place], [place] [address]   Cath Tip Culture     Status: None   Collection Time: 02/25/19  2:03 PM   Specimen: Porta Cath; Other  Result Value Ref Range Status   Specimen Description   Final    PORTA CATH RIGHT CHEST Performed at [place] [address], [place], [place] [address]    Special Requests   Final    NONE Performed at [facility], [place] [address], [place], [place] [address]    Culture   Final    NO GROWTH 2 DAYS Performed at [place] [address], [place], [place] [address]    Report Status 02/28/2019 FINAL  Final  Culture, blood (Routine X 2) w Reflex to ID Panel     Status: None (Preliminary result)   Collection Time: 02/27/19 12:35 PM   Specimen: BLOOD  Result Value Ref Range Status   Specimen Description   Final    BLOOD RIGHT ARM Performed at [place] [address] [place], [place] [address]    Special Requests   Final    BOTTLES DRAWN AEROBIC ONLY Blood Culture results may not be optimal due to an inadequate volume of blood received in culture bottles Performed at [place] [address] [place], [place] [address]    Culture   Final    NO GROWTH < 24 HOURS Performed at [place] [address] [place], [place] [address]     Report Status PENDING  Incomplete  Culture, blood (Routine X 2) w Reflex to ID Panel     Status: None (Preliminary result)   Collection Time: 02/27/19 12:50 PM   Specimen: BLOOD  Result Value Ref Range Status   Specimen Description  Final    BLOOD RIGHT ARM Performed at [place] [address] [place], [place] [address]    Special Requests   Final    BOTTLES DRAWN AEROBIC ONLY Blood Culture adequate volume Performed at [place] [address], [place], [place] [address]    Culture   Final    NO GROWTH < 24 HOURS Performed at [place] [address] [place], [place] [address]    Report Status PENDING  Incomplete     Labs: Basic Metabolic Panel: Recent Labs  Lab 02/22/19 2013 02/23/19 0418 02/24/19 0322 02/25/19 0350 02/26/19 0533  NA 147* 146* 142 140 138  K 3.6 3.8 3.8 4.3 5.0  CL 109 106 99 96* 99  CO2 32 32 35* 35* 29  GLUCOSE 116* 116* 148* 135* 113*  BUN 12 11 9  6* 7*  CREATININE 0.71 0.61 0.40* 0.44 0.49  CALCIUM 8.3* 8.2* 8.2* 8.4* 8.6*   Liver Function Tests: No results for input(s): AST, ALT, ALKPHOS, BILITOT, PROT, ALBUMIN in the last 168 hours. No results for input(s): LIPASE, AMYLASE in the last 168 hours. No results for input(s): AMMONIA in the last 168 hours. CBC: Recent Labs  Lab 02/24/19 0322 02/25/19 0350 02/25/19 0941 02/26/19 0533 02/27/19 0702  WBC 1.4* 4.4 4.3 6.1 1.8*  NEUTROABS  --   --  3.9 5.4 1.6*  HGB 9.2* 9.2* 9.2* 9.6* 8.1*  HCT 28.1* 28.5* 28.7* 30.4* 24.6*  MCV 95.9 95.0 97.0 97.7 94.3  PLT 67* 65* 64* 61* 40*   Cardiac Enzymes: No results for input(s): CKTOTAL, CKMB, CKMBINDEX, TROPONINI in the last 168 hours. D-Dimer No results for input(s): DDIMER in the last 72 hours. BNP: Invalid input(s): POCBNP CBG: No results for input(s): GLUCAP in the last 168 hours. Anemia work up No results for input(s): VITAMINB12, FOLATE, FERRITIN, TIBC, IRON, RETICCTPCT in the  last 72 hours. Urinalysis    Component Value Date/Time   COLORURINE AMBER (A) 02/28/2019 2044   APPEARANCEUR CLEAR 02/05/2019 2044   LABSPEC 1.020 01/30/2019 2044   PHURINE 5.0 02/18/2019 2044   GLUCOSEU NEGATIVE 02/16/2019 2044   HGBUR NEGATIVE 02/09/2019 2044   BILIRUBINUR SMALL (A) 01/31/2019 2044   KETONESUR 5 (A) 02/19/2019 2044   PROTEINUR NEGATIVE 02/28/2019 2044   UROBILINOGEN 0.2 12/10/2008 1306   NITRITE NEGATIVE 02/28/2019 2044   LEUKOCYTESUR NEGATIVE 02/28/2019 2044   Sepsis Labs Invalid input(s): PROCALCITONIN,  WBC,  LACTICIDVEN     SIGNED:  Hosie Poisson, MD  [facility] 2019-03-30, 8:32 AM Pager   If 7PM-7AM, please contact night-coverage [email or web address] Password [email or web address]

## 2019-03-02 DEATH — deceased

## 2019-03-04 LAB — CULTURE, BLOOD (ROUTINE X 2)
Culture: NO GROWTH
Culture: NO GROWTH
Special Requests: ADEQUATE

## 2019-03-06 ENCOUNTER — Inpatient Hospital Stay: Payer: Medicare HMO | Admitting: Hematology

## 2019-03-06 ENCOUNTER — Inpatient Hospital Stay: Payer: Medicare HMO

## 2019-03-06 NOTE — Therapy (Signed)
[facility] [address] [place], [place], [address] Phone: [phone]   Fax:  [phone]  Patient Details  Name: Gloria Murray MRN: [id number] Date of Birth: 05/29/1951 Referring Provider:  Squire, Sarah, MD  Encounter Date: 03/06/2019  SPEECH THERAPY DISCHARGE SUMMARY  Visits from Start of Care: 2  Current functional level related to goals / functional outcomes: Pt had 2 virtual visits with this SLP, beginning in April 2020. She was admitted in late July 2020 and underwent MBSS due to concerns of aspiration. See that report for details. Pt, after a short admission, unfortunately expired. Goals enforced are below:   SLP SHORT TERM GOAL #1    Title  pt will demonstrate understanding of how to accurately complete HEP     Time  1     Period  --   session    Status  On-going          SLP SHORT TERM GOAL #2    Title  pt will tell SLP 3 overt s/s aspiration PNA     Time  2     Period  --   visits    Status  On-going          SLP SHORT TERM GOAL #3    Title  pt will tell SLP how a food journal can assist return to safest pt diet     Time  2     Period  --   vistis    Status  On-going             SLP Long Term Goals - 12/28/18 0001              SLP LONG TERM GOAL #1    Title  pt will demo understanding how to complete HEP with accuracy over two visits     Time  3     Period  --   visits    Status  On-going          SLP LONG TERM GOAL #2    Title  pt will tell SLP when she can decr frequency of HEP to twice a week     Time  3     Period  --   visits    Status  On-going     Remaining deficits: None   Education / Equipment: HEP procedure explained, late effects head/neck radiation   Plan: Patient agrees to discharge.  Patient goals were not met. Patient is being discharged due to a change in medical status.  ?????        SCHINKE,CARL ,MS, CCC-SLP  03/06/2019, 8:03 AM  [facility] [address] [place], [place], [address] Phone: [phone]   Fax:  [phone] 

## 2019-05-14 ENCOUNTER — Ambulatory Visit: Payer: Medicare HMO | Admitting: Radiation Oncology

## 2019-05-17 ENCOUNTER — Ambulatory Visit: Payer: Medicare HMO | Admitting: Radiation Oncology

## 2019-10-17 LAB — ALDOSTERONE + RENIN ACTIVITY W/ RATIO
ALDO / PRA Ratio: 0.4 (ref 0.0–30.0)
Aldosterone: 1.3 ng/dL (ref 0.0–30.0)
PRA LC/MS/MS: 3.293 ng/mL/hr (ref 0.167–5.380)

## 2019-10-17 LAB — ACTH

## 2020-11-18 IMAGING — CT NUCLEAR MEDICINE PET IMAGE INITIAL (PI) SKULL BASE TO THIGH
1 of 7 series · 3 of 16 positions shown, 4 images · non-contrast
Comparison: Neck CT 10/26/2018

CLINICAL DATA: Initial treatment strategy for head neck carcinoma.
Original carcinoma.

EXAM:
NUCLEAR MEDICINE PET SKULL BASE TO THIGH
TECHNIQUE: 10.7 mCi F-18 FDG was injected intravenously. Full-ring PET imaging
was performed from the skull base to thigh after the radiotracer. CT
data was obtained and used for attenuation correction and anatomic
localization.
Fasting blood glucose: 125 mg/dl

[Series 4: ct hn_sk_th 5.0 hd_fov · axial · 1.17mm/px · z∈[-724,+236]mm · 3 of 241 slices shown, 4 images]
[im 1/241  soft-tissue]
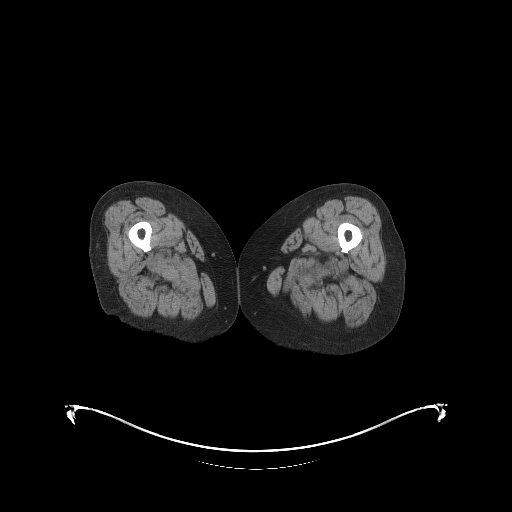
[im 1/241  bone]
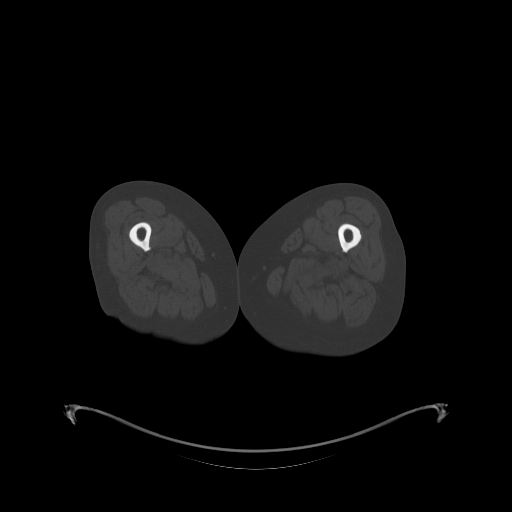
[im 121/241  soft-tissue]
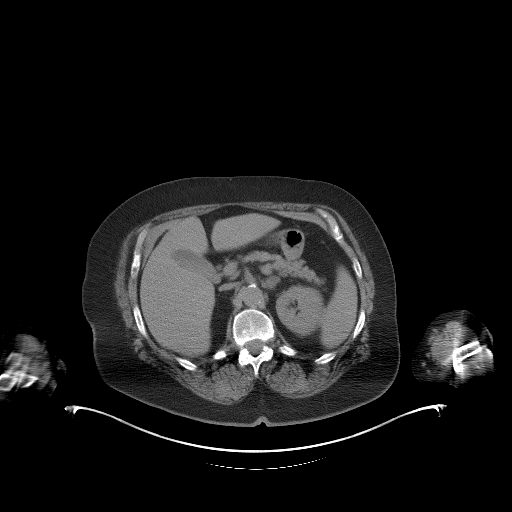
[im 241/241  soft-tissue]
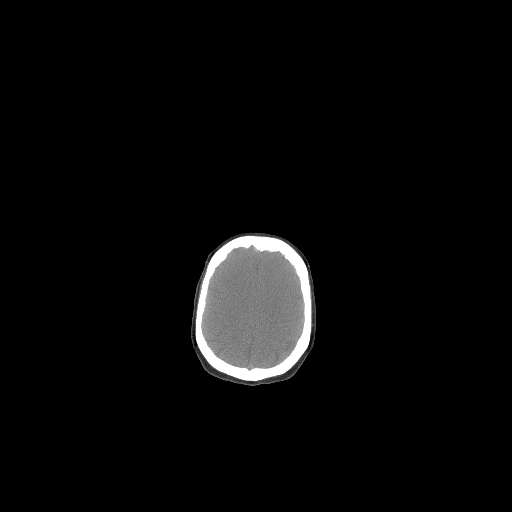

[3 of 16 positions shown; findings below may reference images not displayed]

FINDINGS: Mediastinal blood pool activity: SUV max

NECK: Intensely hypermetabolic supraglottic tissue predominantly on
the LEFT but extending across midline anteriorly. Activity is
intense with SUV max equal 33. Metabolic activity inferiorly to
potentially involve the larynx on the LEFT.

Bilateral intensely hypermetabolic enlarged level 2 lymph nodes.
Activity is intense and uniform SUV max equal 42 on the RIGHT and
SUV max 38 on the LEFT. Smaller hypermetabolic lymph nodes extend
inferiorly to the level 3 nodal station on LEFT and RIGHT.
Hypermetabolic lymph node LEFT level III with SUV max equal
measures 11 mm (image 51/4) adjacent to the carotid stent.

No hypermetabolic supraclavicular nodes. No level I hypermetabolic
lymph nodes.

Incidental CT findings: none

CHEST: No hypermetabolic mediastinal or hilar nodes. No suspicious
pulmonary nodules on the CT scan.

Incidental CT findings: none

ABDOMEN/PELVIS: No abnormal hypermetabolic activity within the
liver, pancreas, adrenal glands, or spleen. No hypermetabolic lymph
nodes in the abdomen or pelvis.

Incidental CT findings: Low-density enlargement of the LEFT adrenal
gland without significant metabolic activity is favored benign.
Lesion does not meet criteria for adenoma on noncontrast CT
therefore favor lipid poor adenoma versus hyperplasia.

SKELETON: No aggressive osseous lesion.

Incidental CT findings: none
IMPRESSION: 1. Intensely hypermetabolic supraglottic mass predominantly
involving the LEFT aryepiglottic fold but extending across midline
anteriorly. Inferiorly hypermetabolic activity extends to the level
of the larynx on the LEFT.
2. Bilateral bulky intensely hypermetabolic metastatic level II
lymph nodes. Metastatic adenopathy extends with smaller nodes in the
level III nodal stations bilaterally.
3. No evidence of thoracic metastasis.
4. Enlarged LEFT adrenal gland consistent with hyperplasia or lipid
poor adenoma.

## 2020-12-01 IMAGING — XA IR LEFT FLUORO GUIDE CV LINE
1 series · 1 of 1 positions shown · non-contrast
Comparison: none

INDICATION: 67-year-old female with history of laryngeal cancer. She requires
durable venous access for chemotherapy.

[Series 300: ir imaging guided port insertion · 1 of 1 slices shown]
[im 1/1]
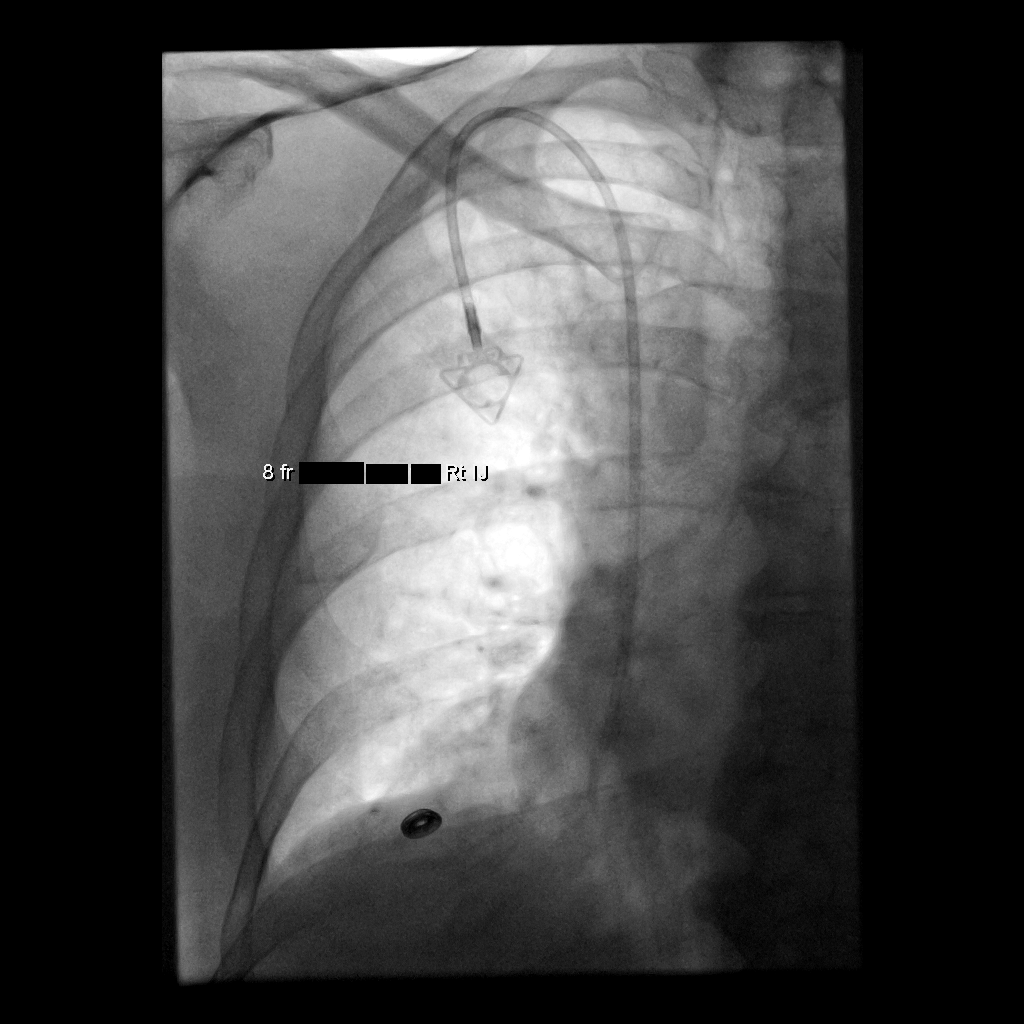

[1 of 1 positions shown; findings below may reference images not displayed]

EXAM:
IMPLANTED PORT A CATH PLACEMENT WITH ULTRASOUND AND FLUOROSCOPIC
GUIDANCE

MEDICATIONS:
2 g Ancef; The antibiotic was administered within an appropriate
time interval prior to skin puncture.

ANESTHESIA/SEDATION:
Versed 4 mg IV; Fentanyl 100 mcg IV;

Moderate Sedation Time:  22 minutes

The patient was continuously monitored during the procedure by the
interventional radiology nurse under my direct supervision.

FLUOROSCOPY TIME:  0 minutes, 42 seconds (11 mGy)

COMPLICATIONS:
None immediate.

PROCEDURE:
The right neck and chest was prepped with chlorhexidine, and draped
in the usual sterile fashion using maximum barrier technique (cap
and mask, sterile gown, sterile gloves, large sterile sheet, hand
hygiene and cutaneous antiseptic). Local anesthesia was attained by
infiltration with 1% lidocaine with epinephrine.

Ultrasound demonstrated patency of the right internal jugular vein,
and this was documented with an image. Under real-time ultrasound
guidance, this vein was accessed with a 21 gauge micropuncture
needle and image documentation was performed. A small dermatotomy
was made at the access site with an 11 scalpel. A 0.018" wire was
advanced into the SVC and the access needle exchanged for a 4F
micropuncture vascular sheath. The 0.018" wire was then removed and
a 0.035" wire advanced into the IVC.



The venous access site was then serially dilated and a peel away
vascular sheath placed over the wire. The wire was removed and the
port catheter advanced into position under fluoroscopic guidance.
The catheter tip is positioned in the superior cavoatrial junction.
This was documented with a spot image. The portacatheter was then
tested and found to flush and aspirate well. The port was flushed
with saline followed by 100 units/mL heparinized saline.

The pocket was then closed in two layers using first subdermal
inverted interrupted absorbable sutures followed by a running
subcuticular suture. The epidermis was then sealed with Dermabond.
The dermatotomy at the venous access site was also closed with
Dermabond.
IMPRESSION: Successful placement of a right IJ approach Power Port with
ultrasound and fluoroscopic guidance. The catheter is ready for use.
